# Patient Record
Sex: Female | Born: 1935 | Race: Black or African American | Hispanic: No | Smoking: Former smoker
Health system: Southern US, Community
[De-identification: ages and names within clinical notes are randomized; demographics above are authoritative.]

## PROBLEM LIST (undated history)

## (undated) DIAGNOSIS — I10 Essential (primary) hypertension: Secondary | ICD-10-CM

## (undated) DIAGNOSIS — I82409 Acute embolism and thrombosis of unspecified deep veins of unspecified lower extremity: Secondary | ICD-10-CM

## (undated) DIAGNOSIS — I4891 Unspecified atrial fibrillation: Secondary | ICD-10-CM

## (undated) DIAGNOSIS — I499 Cardiac arrhythmia, unspecified: Secondary | ICD-10-CM

## (undated) DIAGNOSIS — I639 Cerebral infarction, unspecified: Secondary | ICD-10-CM

## (undated) DIAGNOSIS — Z853 Personal history of malignant neoplasm of breast: Secondary | ICD-10-CM

## (undated) HISTORY — DX: Cerebral infarction, unspecified: I63.9

---

## 2014-07-19 ENCOUNTER — Emergency Department (HOSPITAL_BASED_OUTPATIENT_CLINIC_OR_DEPARTMENT_OTHER)
Admission: EM | Admit: 2014-07-19 | Discharge: 2014-07-19 | Disposition: A | Discharge status: Home / Self Care | Payer: Medicare Other | Attending: Emergency Medicine | Admitting: Emergency Medicine

## 2014-07-19 ENCOUNTER — Encounter (HOSPITAL_BASED_OUTPATIENT_CLINIC_OR_DEPARTMENT_OTHER): Payer: Self-pay | Admitting: *Deleted

## 2014-07-19 DIAGNOSIS — E860 Dehydration: Secondary | ICD-10-CM | POA: Insufficient documentation

## 2014-07-19 DIAGNOSIS — Z79899 Other long term (current) drug therapy: Secondary | ICD-10-CM | POA: Diagnosis not present

## 2014-07-19 DIAGNOSIS — Z792 Long term (current) use of antibiotics: Secondary | ICD-10-CM | POA: Diagnosis not present

## 2014-07-19 DIAGNOSIS — Z87891 Personal history of nicotine dependence: Secondary | ICD-10-CM | POA: Diagnosis not present

## 2014-07-19 DIAGNOSIS — N390 Urinary tract infection, site not specified: Secondary | ICD-10-CM | POA: Insufficient documentation

## 2014-07-19 DIAGNOSIS — R42 Dizziness and giddiness: Secondary | ICD-10-CM

## 2014-07-19 DIAGNOSIS — I10 Essential (primary) hypertension: Secondary | ICD-10-CM | POA: Insufficient documentation

## 2014-07-19 DIAGNOSIS — R8271 Bacteriuria: Secondary | ICD-10-CM

## 2014-07-19 HISTORY — DX: Essential (primary) hypertension: I10

## 2014-07-19 LAB — BASIC METABOLIC PANEL
Anion gap: 11 (ref 5–15)
BUN: 16 mg/dL (ref 6–23)
CALCIUM: 12.8 mg/dL — AB (ref 8.4–10.5)
CO2: 26 mEq/L (ref 19–32)
CREATININE: 1 mg/dL (ref 0.50–1.10)
Chloride: 96 mEq/L (ref 96–112)
GFR calc Af Amer: 61 mL/min — ABNORMAL LOW (ref >90)
GFR, EST NON AFRICAN AMERICAN: 53 mL/min — AB (ref >90)
GLUCOSE: 120 mg/dL — AB (ref 70–99)
Potassium: 3.7 mEq/L (ref 3.7–5.3)
Sodium: 133 mEq/L — ABNORMAL LOW (ref 137–147)

## 2014-07-19 LAB — CBC WITH DIFFERENTIAL/PLATELET
BASOS ABS: 0.1 10*3/uL (ref 0.0–0.1)
Basophils Relative: 0 % (ref 0–1)
Eosinophils Absolute: 0 10*3/uL (ref 0.0–0.7)
Eosinophils Relative: 0 % (ref 0–5)
HCT: 36 % (ref 36.0–46.0)
Hemoglobin: 11.9 g/dL — ABNORMAL LOW (ref 12.0–15.0)
LYMPHS PCT: 10 % — AB (ref 12–46)
Lymphs Abs: 1.3 10*3/uL (ref 0.7–4.0)
MCH: 26.6 pg (ref 26.0–34.0)
MCHC: 33.1 g/dL (ref 30.0–36.0)
MCV: 80.5 fL (ref 78.0–100.0)
Monocytes Absolute: 0.7 10*3/uL (ref 0.1–1.0)
Monocytes Relative: 6 % (ref 3–12)
Neutro Abs: 10.3 10*3/uL — ABNORMAL HIGH (ref 1.7–7.7)
Neutrophils Relative %: 84 % — ABNORMAL HIGH (ref 43–77)
Platelets: 172 10*3/uL (ref 150–400)
RBC: 4.47 MIL/uL (ref 3.87–5.11)
RDW: 14 % (ref 11.5–15.5)
WBC: 12.3 10*3/uL — AB (ref 4.0–10.5)

## 2014-07-19 LAB — URINALYSIS, ROUTINE W REFLEX MICROSCOPIC
GLUCOSE, UA: NEGATIVE mg/dL
KETONES UR: 15 mg/dL — AB
Nitrite: POSITIVE — AB
Protein, ur: 100 mg/dL — AB
Specific Gravity, Urine: 1.026 (ref 1.005–1.030)
Urobilinogen, UA: >8 mg/dL — ABNORMAL HIGH (ref 0.0–1.0)
pH: 6 (ref 5.0–8.0)

## 2014-07-19 LAB — URINE MICROSCOPIC-ADD ON

## 2014-07-19 MED ORDER — SODIUM CHLORIDE 0.9 % IV BOLUS (SEPSIS)
1000.0000 mL | Freq: Once | INTRAVENOUS | Status: AC
  Administered 2014-07-19: 1000 mL via INTRAVENOUS

## 2014-07-19 NOTE — Discharge Instructions (Signed)
Asymptomatic Bacteriuria Asymptomatic bacteriuria is the presence of a large number of bacteria in your urine without the usual symptoms of burning or frequent urination. The following conditions increase the risk of asymptomatic bacteriuria:  Diabetes mellitus.  Advanced age.  Pregnancy in the first trimester.  Kidney stones.  Kidney transplants.  Leaky kidney tube valve in young children (reflux). Treatment for this condition is not needed in most people and can lead to other problems such as too much yeast and growth of resistant bacteria. However, some people, such as pregnant women, do need treatment to prevent kidney infection. Asymptomatic bacteriuria in pregnancy is also associated with fetal growth restriction, premature labor, and newborn death. HOME CARE INSTRUCTIONS Monitor your condition for any changes. The following actions may help to relieve any discomfort you are feeling:  Drink enough water and fluids to keep your urine clear or pale yellow. Go to the bathroom more often to keep your bladder empty.  Keep the area around your vagina and rectum clean. Wipe yourself from front to back after urinating. SEEK IMMEDIATE MEDICAL CARE IF:  You develop signs of an infection such as:  Burning with urination.  Frequency of voiding.  Back pain.  Fever.  You have blood in the urine.  You develop a fever. MAKE SURE YOU:  Understand these instructions.  Will watch your condition.  Will get help right away if you are not doing well or get worse. Document Released: 08/07/2005 Document Revised: 12/22/2013 Document Reviewed: 01/27/2013 [facility] Patient Information 2015 [place], [place]. This information is not intended to replace advice given to you by your health care provider. Make sure you discuss any questions you have with your health care provider.    Dizziness Dizziness is a common problem. It is a feeling of unsteadiness or light-headedness. You may feel like  you are about to faint. Dizziness can lead to injury if you stumble or fall. A person of any age group can suffer from dizziness, but dizziness is more common in older adults. CAUSES  Dizziness can be caused by many different things, including:  Middle ear problems.  Standing for too long.  Infections.  An allergic reaction.  Aging.  An emotional response to something, such as the sight of blood.  Side effects of medicines.  Tiredness.  Problems with circulation or blood pressure.  Excessive use of alcohol or medicines, or illegal drug use.  Breathing too fast (hyperventilation).  An irregular heart rhythm (arrhythmia).  A low red blood cell count (anemia).  Pregnancy.  Vomiting, diarrhea, fever, or other illnesses that cause body fluid loss (dehydration).  Diseases or conditions such as Parkinson's disease, high blood pressure (hypertension), diabetes, and thyroid problems.  Exposure to extreme heat. DIAGNOSIS  Your health care provider will ask about your symptoms, perform a physical exam, and perform an electrocardiogram (ECG) to record the electrical activity of your heart. Your health care provider may also perform other heart or blood tests to determine the cause of your dizziness. These may include:  Transthoracic echocardiogram (TTE). During echocardiography, sound waves are used to evaluate how blood flows through your heart.  Transesophageal echocardiogram (TEE).  Cardiac monitoring. This allows your health care provider to monitor your heart rate and rhythm in real time.  Holter monitor. This is a portable device that records your heartbeat and can help diagnose heart arrhythmias. It allows your health care provider to track your heart activity for several days if needed.  Stress tests by exercise or by giving medicine  that makes the heart beat faster. TREATMENT  Treatment of dizziness depends on the cause of your symptoms and can vary greatly. HOME CARE  INSTRUCTIONS   Drink enough fluids to keep your urine clear or pale yellow. This is especially important in very hot weather. In older adults, it is also important in cold weather.  Take your medicine exactly as directed if your dizziness is caused by medicines. When taking blood pressure medicines, it is especially important to get up slowly.  Rise slowly from chairs and steady yourself until you feel okay.  In the morning, first sit up on the side of the bed. When you feel okay, stand slowly while holding onto something until you know your balance is fine.  Move your legs often if you need to stand in one place for a long time. Tighten and relax your muscles in your legs while standing.  Have someone stay with you for 1-2 days if dizziness continues to be a problem. Do this until you feel you are well enough to stay alone. Have the person call your health care provider if he or she notices changes in you that are concerning.  Do not drive or use heavy machinery if you feel dizzy.  Do not drink alcohol. SEEK IMMEDIATE MEDICAL CARE IF:   Your dizziness or light-headedness gets worse.  You feel nauseous or vomit.  You have problems talking, walking, or using your arms, hands, or legs.  You feel weak.  You are not thinking clearly or you have trouble forming sentences. It may take a friend or family member to notice this.  You have chest pain, abdominal pain, shortness of breath, or sweating.  Your vision changes.  You notice any bleeding.  You have side effects from medicine that seems to be getting worse rather than better. MAKE SURE YOU:   Understand these instructions.  Will watch your condition.  Will get help right away if you are not doing well or get worse. Document Released: 01/31/2001 Document Revised: 08/12/2013 Document Reviewed: 02/24/2011 [facility] Patient Information 2015 [place], [place]. This information is not intended to replace advice given to you by your  health care provider. Make sure you discuss any questions you have with your health care provider.    Dehydration, Adult Dehydration is when you lose more fluids from the body than you take in. Vital organs like the kidneys, brain, and heart cannot function without a proper amount of fluids and salt. Any loss of fluids from the body can cause dehydration.  CAUSES   Vomiting.  Diarrhea.  Excessive sweating.  Excessive urine output.  Fever. SYMPTOMS  Mild dehydration  Thirst.  Dry lips.  Slightly dry mouth. Moderate dehydration  Very dry mouth.  Sunken eyes.  Skin does not bounce back quickly when lightly pinched and released.  Dark urine and decreased urine production.  Decreased tear production.  Headache. Severe dehydration  Very dry mouth.  Extreme thirst.  Rapid, weak pulse (more than 100 beats per minute at rest).  Cold hands and feet.  Not able to sweat in spite of heat and temperature.  Rapid breathing.  Blue lips.  Confusion and lethargy.  Difficulty being awakened.  Minimal urine production.  No tears. DIAGNOSIS  Your caregiver will diagnose dehydration based on your symptoms and your exam. Blood and urine tests will help confirm the diagnosis. The diagnostic evaluation should also identify the cause of dehydration. TREATMENT  Treatment of mild or moderate dehydration can often be done at  home by increasing the amount of fluids that you drink. It is best to drink small amounts of fluid more often. Drinking too much at one time can make vomiting worse. Refer to the home care instructions below. Severe dehydration needs to be treated at the hospital where you will probably be given intravenous (IV) fluids that contain water and electrolytes. HOME CARE INSTRUCTIONS   Ask your caregiver about specific rehydration instructions.  Drink enough fluids to keep your urine clear or pale yellow.  Drink small amounts frequently if you have nausea and  vomiting.  Eat as you normally do.  Avoid:  Foods or drinks high in sugar.  Carbonated drinks.  Juice.  Extremely hot or cold fluids.  Drinks with caffeine.  Fatty, greasy foods.  Alcohol.  Tobacco.  Overeating.  Gelatin desserts.  Wash your hands well to avoid spreading bacteria and viruses.  Only take over-the-counter or prescription medicines for pain, discomfort, or fever as directed by your caregiver.  Ask your caregiver if you should continue all prescribed and over-the-counter medicines.  Keep all follow-up appointments with your caregiver. SEEK MEDICAL CARE IF:  You have abdominal pain and it increases or stays in one area (localizes).  You have a rash, stiff neck, or severe headache.  You are irritable, sleepy, or difficult to awaken.  You are weak, dizzy, or extremely thirsty. SEEK IMMEDIATE MEDICAL CARE IF:   You are unable to keep fluids down or you get worse despite treatment.  You have frequent episodes of vomiting or diarrhea.  You have blood or green matter (bile) in your vomit.  You have blood in your stool or your stool looks black and tarry.  You have not urinated in 6 to 8 hours, or you have only urinated a small amount of very dark urine.  You have a fever.  You faint. MAKE SURE YOU:   Understand these instructions.  Will watch your condition.  Will get help right away if you are not doing well or get worse. Document Released: 08/07/2005 Document Revised: 10/30/2011 Document Reviewed: 03/27/2011 [facility] Patient Information 2015 [place], [place]. This information is not intended to replace advice given to you by your health care provider. Make sure you discuss any questions you have with your health care provider.

## 2014-07-19 NOTE — ED Notes (Signed)
Patient states that she has felt dizzy with N/V/D  on and off since Wednesday. States that she mostly just drinks ginger ale and water, but not eating much. Was taking an abx for an infection, last dose was taken on Wednesday. States that she didn't finish the course.

## 2014-07-19 NOTE — ED Provider Notes (Signed)
CSN: [id number]     Arrival date & time 07/19/14  0704 History   First MD Initiated Contact with Patient 07/19/14 [phone]     Chief Complaint  Patient presents with  . Dizziness     (Consider location/radiation/quality/duration/timing/severity/associated sxs/prior Treatment) HPI  80 year old female presents with lightheadedness with standing over the past 4-5 days. It feels like she's going to pass out, no spinning sensation. She was on Ceftin for a cellulitis of her left breast but stopped it 5 days ago because she started developing vomiting and diarrhea. She is continued to have vomiting once or twice a day as well as many loose stools per day. There is no mucus or blood in her stools. No abdominal pain. No headache, chest pain, or shortness of breath. She has not noticed any fevers. The patient states that while she is laying or sitting she has no symptoms but when she stands up she feels lightheaded that improves with sitting back down. Denies any weakness or numbness. Has not felt off balance and has not fallen. She feels like her breast infection has resolved.  Past Medical History  Diagnosis Date  . Hypertension    History reviewed. No pertinent past surgical history. No family history on file. History  Substance Use Topics  . Smoking status: Former Research scientist (life sciences)  . Smokeless tobacco: Not on file  . Alcohol Use: No   OB History    No data available     Review of Systems  Constitutional: Negative for fever.  Respiratory: Negative for shortness of breath.   Cardiovascular: Negative for chest pain.  Gastrointestinal: Positive for vomiting and diarrhea. Negative for abdominal pain and blood in stool.  Genitourinary: Positive for frequency (for quite some time). Negative for dysuria.  Musculoskeletal: Negative for back pain.  Neurological: Positive for light-headedness. Negative for weakness and numbness.  All other systems reviewed and are negative.     Allergies  Review of  patient's allergies indicates no known allergies.  Home Medications   Prior to Admission medications   Medication Sig Start Date End Date Taking? Authorizing Provider  amLODipine-valsartan (EXFORGE) 5-160 MG per tablet Take 1 tablet by mouth daily.   Yes Historical Provider, MD  cefUROXime (CEFTIN) 250 MG tablet Take 250 mg by mouth 2 (two) times daily with a meal.   Yes Historical Provider, MD  LORazepam (ATIVAN) 1 MG tablet Take 1 mg by mouth every 8 (eight) hours.   Yes Historical Provider, MD   BP 133/59 mmHg  Pulse 102  Temp(Src) 99.3 F (37.4 C) (Oral)  Resp 18  Ht 5\' 6"  (1.676 m)  Wt 140 lb (63.504 kg)  BMI 22.61 kg/m2  SpO2 96% Physical Exam  Constitutional: She is oriented to person, place, and time. She appears well-developed and well-nourished. No distress.  HENT:  Head: Normocephalic and atraumatic.  Right Ear: External ear normal.  Left Ear: External ear normal.  Nose: Nose normal.  Eyes: EOM are normal. Pupils are equal, round, and reactive to light. Right eye exhibits no discharge. Left eye exhibits no discharge.  Cardiovascular: Normal rate, regular rhythm and normal heart sounds.   No murmur heard. Pulmonary/Chest: Effort normal and breath sounds normal.  Abdominal: Soft. She exhibits no distension. There is no tenderness.  Neurological: She is alert and oriented to person, place, and time.  CN 2-12 grossly intact. 5/5 strength in all 4 extremities. Grossly normal sensation. Normal finger to nose. Normal gait.  Skin: Skin is warm and dry. She is not  diaphoretic.  Nursing note and vitals reviewed.   ED Course  Procedures (including critical care time) Labs Review Labs Reviewed  BASIC METABOLIC PANEL - Abnormal; Notable for the following:    Sodium 133 (*)    Glucose, Bld 120 (*)    Calcium 12.8 (*)    GFR calc non Af Amer 53 (*)    GFR calc Af Amer 61 (*)    All other components within normal limits  URINALYSIS, ROUTINE W REFLEX MICROSCOPIC - Abnormal;  Notable for the following:    Color, Urine ORANGE (*)    APPearance CLOUDY (*)    Hgb urine dipstick TRACE (*)    Bilirubin Urine MODERATE (*)    Ketones, ur 15 (*)    Protein, ur 100 (*)    Urobilinogen, UA >8.0 (*)    Nitrite POSITIVE (*)    Leukocytes, UA MODERATE (*)    All other components within normal limits  CBC WITH DIFFERENTIAL - Abnormal; Notable for the following:    WBC 12.3 (*)    Hemoglobin 11.9 (*)    Neutrophils Relative % 84 (*)    Neutro Abs 10.3 (*)    Lymphocytes Relative 10 (*)    All other components within normal limits  URINE MICROSCOPIC-ADD ON - Abnormal; Notable for the following:    Squamous Epithelial / LPF MANY (*)    Bacteria, UA MANY (*)    Casts HYALINE CASTS (*)    All other components within normal limits  URINE CULTURE    Imaging Review No results found.   EKG Interpretation   Date/Time:  Sunday July 19 2014 07:44:28 EST Ventricular Rate:  90 PR Interval:  154 QRS Duration: 76 QT Interval:  314 QTC Calculation: 384 R Axis:   -34 Text Interpretation:  Normal sinus rhythm Left axis deviation T wave  abnormality, consider lateral ischemia Abnormal ECG No old tracing to  compare Confirmed by [place] ([id number]) on 07/19/2014 7:51:30 AM      MDM   Final diagnoses:  Lightheadedness  Dehydration  Asymptomatic bacteriuria    Patient's lightheadedness appears to be from a dehydration given her vomiting and orthostatic symptoms. Patient's lab work is unremarkable, no evidence of renal failure. She feels better with IV fluids and oral fluids. She does have asymptomatic bacteriuria. Given the she's not symptomatic, will send for culture and hold off on antibiotics as I believe that the antibiotic induced diarrhea is what is causing most of her symptoms. Her symptoms seemed to resolve on the ED. There is no sign that this is a vertigo. Patient is not having any chest pain or anginal symptoms to be concerned with ACS. Discussed  strict return precautions, recommended increased fluid intake, and will recommend close PCP follow-up.    Ephraim Hamburger, MD 07/19/14 [phone]

## 2014-07-21 LAB — URINE CULTURE: Colony Count: 15000

## 2017-05-08 DIAGNOSIS — I4821 Permanent atrial fibrillation: Secondary | ICD-10-CM | POA: Diagnosis present

## 2017-05-08 DIAGNOSIS — I10 Essential (primary) hypertension: Secondary | ICD-10-CM | POA: Diagnosis present

## 2017-05-18 ENCOUNTER — Emergency Department (HOSPITAL_COMMUNITY): Payer: Medicare HMO

## 2017-05-18 ENCOUNTER — Emergency Department (HOSPITAL_COMMUNITY): Payer: Medicare HMO | Admitting: Anesthesiology

## 2017-05-18 ENCOUNTER — Encounter (HOSPITAL_COMMUNITY): Payer: Self-pay | Admitting: Emergency Medicine

## 2017-05-18 ENCOUNTER — Inpatient Hospital Stay (HOSPITAL_COMMUNITY): Payer: Medicare HMO

## 2017-05-18 ENCOUNTER — Encounter (HOSPITAL_COMMUNITY)
Admission: EM | Disposition: A | Discharge status: Home / Self Care | Payer: Self-pay | Source: Home / Self Care | Attending: Neurology

## 2017-05-18 ENCOUNTER — Inpatient Hospital Stay (HOSPITAL_COMMUNITY)
Admission: EM | Admit: 2017-05-18 | Discharge: 2017-05-21 | DRG: 024 | Disposition: A | Discharge status: Home / Self Care | Payer: Medicare HMO | Attending: Neurology | Admitting: Neurology

## 2017-05-18 DIAGNOSIS — D352 Benign neoplasm of pituitary gland: Secondary | ICD-10-CM | POA: Diagnosis present

## 2017-05-18 DIAGNOSIS — R414 Neurologic neglect syndrome: Secondary | ICD-10-CM | POA: Diagnosis present

## 2017-05-18 DIAGNOSIS — Z853 Personal history of malignant neoplasm of breast: Secondary | ICD-10-CM | POA: Diagnosis not present

## 2017-05-18 DIAGNOSIS — I10 Essential (primary) hypertension: Secondary | ICD-10-CM | POA: Diagnosis present

## 2017-05-18 DIAGNOSIS — Z91013 Allergy to seafood: Secondary | ICD-10-CM

## 2017-05-18 DIAGNOSIS — Z978 Presence of other specified devices: Secondary | ICD-10-CM

## 2017-05-18 DIAGNOSIS — I6310 Cerebral infarction due to embolism of unspecified precerebral artery: Secondary | ICD-10-CM | POA: Diagnosis not present

## 2017-05-18 DIAGNOSIS — Z7901 Long term (current) use of anticoagulants: Secondary | ICD-10-CM

## 2017-05-18 DIAGNOSIS — I63412 Cerebral infarction due to embolism of left middle cerebral artery: Principal | ICD-10-CM

## 2017-05-18 DIAGNOSIS — E785 Hyperlipidemia, unspecified: Secondary | ICD-10-CM | POA: Diagnosis present

## 2017-05-18 DIAGNOSIS — Z91011 Allergy to milk products: Secondary | ICD-10-CM

## 2017-05-18 DIAGNOSIS — Z90710 Acquired absence of both cervix and uterus: Secondary | ICD-10-CM | POA: Diagnosis not present

## 2017-05-18 DIAGNOSIS — Z86718 Personal history of other venous thrombosis and embolism: Secondary | ICD-10-CM | POA: Diagnosis not present

## 2017-05-18 DIAGNOSIS — I480 Paroxysmal atrial fibrillation: Secondary | ICD-10-CM | POA: Diagnosis present

## 2017-05-18 DIAGNOSIS — I272 Pulmonary hypertension, unspecified: Secondary | ICD-10-CM | POA: Diagnosis present

## 2017-05-18 DIAGNOSIS — I63449 Cerebral infarction due to embolism of unspecified cerebellar artery: Secondary | ICD-10-CM | POA: Diagnosis not present

## 2017-05-18 DIAGNOSIS — R4701 Aphasia: Secondary | ICD-10-CM | POA: Diagnosis present

## 2017-05-18 DIAGNOSIS — I639 Cerebral infarction, unspecified: Secondary | ICD-10-CM

## 2017-05-18 DIAGNOSIS — Z87891 Personal history of nicotine dependence: Secondary | ICD-10-CM

## 2017-05-18 DIAGNOSIS — E876 Hypokalemia: Secondary | ICD-10-CM | POA: Diagnosis not present

## 2017-05-18 DIAGNOSIS — I4821 Permanent atrial fibrillation: Secondary | ICD-10-CM | POA: Diagnosis present

## 2017-05-18 DIAGNOSIS — I6340 Cerebral infarction due to embolism of unspecified cerebral artery: Secondary | ICD-10-CM | POA: Diagnosis present

## 2017-05-18 DIAGNOSIS — F419 Anxiety disorder, unspecified: Secondary | ICD-10-CM | POA: Diagnosis present

## 2017-05-18 DIAGNOSIS — Z881 Allergy status to other antibiotic agents status: Secondary | ICD-10-CM

## 2017-05-18 DIAGNOSIS — I739 Peripheral vascular disease, unspecified: Secondary | ICD-10-CM | POA: Diagnosis present

## 2017-05-18 DIAGNOSIS — D649 Anemia, unspecified: Secondary | ICD-10-CM | POA: Diagnosis present

## 2017-05-18 DIAGNOSIS — H4010X0 Unspecified open-angle glaucoma, stage unspecified: Secondary | ICD-10-CM

## 2017-05-18 DIAGNOSIS — I482 Chronic atrial fibrillation: Secondary | ICD-10-CM | POA: Diagnosis not present

## 2017-05-18 DIAGNOSIS — Z8249 Family history of ischemic heart disease and other diseases of the circulatory system: Secondary | ICD-10-CM

## 2017-05-18 DIAGNOSIS — G8191 Hemiplegia, unspecified affecting right dominant side: Secondary | ICD-10-CM | POA: Diagnosis present

## 2017-05-18 DIAGNOSIS — R29717 NIHSS score 17: Secondary | ICD-10-CM | POA: Diagnosis present

## 2017-05-18 DIAGNOSIS — I5030 Unspecified diastolic (congestive) heart failure: Secondary | ICD-10-CM | POA: Diagnosis not present

## 2017-05-18 DIAGNOSIS — Z79899 Other long term (current) drug therapy: Secondary | ICD-10-CM

## 2017-05-18 DIAGNOSIS — Z91018 Allergy to other foods: Secondary | ICD-10-CM

## 2017-05-18 HISTORY — PX: RADIOLOGY WITH ANESTHESIA: SHX6223

## 2017-05-18 HISTORY — DX: Acute embolism and thrombosis of unspecified deep veins of unspecified lower extremity: I82.409

## 2017-05-18 HISTORY — PX: IR ANGIO INTRA EXTRACRAN SEL COM CAROTID INNOMINATE UNI R MOD SED: IMG5359

## 2017-05-18 HISTORY — PX: IR ANGIO VERTEBRAL SEL SUBCLAVIAN INNOMINATE UNI R MOD SED: IMG5365

## 2017-05-18 HISTORY — DX: Unspecified atrial fibrillation: I48.91

## 2017-05-18 HISTORY — DX: Personal history of malignant neoplasm of breast: Z85.3

## 2017-05-18 HISTORY — PX: IR PERCUTANEOUS ART THROMBECTOMY/INFUSION INTRACRANIAL INC DIAG ANGIO: IMG6087

## 2017-05-18 LAB — I-STAT CHEM 8, ED
BUN: 12 mg/dL (ref 6–20)
CALCIUM ION: 0.97 mmol/L — AB (ref 1.15–1.40)
CHLORIDE: 105 mmol/L (ref 101–111)
Creatinine, Ser: 1 mg/dL (ref 0.44–1.00)
GLUCOSE: 94 mg/dL (ref 65–99)
HCT: 38 % (ref 36.0–46.0)
Hemoglobin: 12.9 g/dL (ref 12.0–15.0)
Potassium: 2.8 mmol/L — ABNORMAL LOW (ref 3.5–5.1)
SODIUM: 143 mmol/L (ref 135–145)
TCO2: 25 mmol/L (ref 22–32)

## 2017-05-18 LAB — DIFFERENTIAL
BASOS ABS: 0 10*3/uL (ref 0.0–0.1)
BASOS PCT: 0 %
EOS ABS: 0.2 10*3/uL (ref 0.0–0.7)
Eosinophils Relative: 2 %
Lymphocytes Relative: 36 %
Lymphs Abs: 3.4 10*3/uL (ref 0.7–4.0)
MONOS PCT: 10 %
Monocytes Absolute: 0.9 10*3/uL (ref 0.1–1.0)
Neutro Abs: 4.9 10*3/uL (ref 1.7–7.7)
Neutrophils Relative %: 52 %

## 2017-05-18 LAB — CBC
HEMATOCRIT: 34.1 % — AB (ref 36.0–46.0)
Hemoglobin: 10.8 g/dL — ABNORMAL LOW (ref 12.0–15.0)
MCH: 24.1 pg — ABNORMAL LOW (ref 26.0–34.0)
MCHC: 31.7 g/dL (ref 30.0–36.0)
MCV: 75.9 fL — ABNORMAL LOW (ref 78.0–100.0)
PLATELETS: 218 10*3/uL (ref 150–400)
RBC: 4.49 MIL/uL (ref 3.87–5.11)
RDW: 18.3 % — AB (ref 11.5–15.5)
WBC: 9.4 10*3/uL (ref 4.0–10.5)

## 2017-05-18 LAB — COMPREHENSIVE METABOLIC PANEL
ALT: 24 U/L (ref 14–54)
AST: 29 U/L (ref 15–41)
Albumin: 3.9 g/dL (ref 3.5–5.0)
Alkaline Phosphatase: 77 U/L (ref 38–126)
Anion gap: 11 (ref 5–15)
BUN: 12 mg/dL (ref 6–20)
CHLORIDE: 107 mmol/L (ref 101–111)
CO2: 21 mmol/L — AB (ref 22–32)
Calcium: 8.5 mg/dL — ABNORMAL LOW (ref 8.9–10.3)
Creatinine, Ser: 1.03 mg/dL — ABNORMAL HIGH (ref 0.44–1.00)
GFR, EST AFRICAN AMERICAN: 58 mL/min — AB (ref >60)
GFR, EST NON AFRICAN AMERICAN: 50 mL/min — AB (ref >60)
Glucose, Bld: 88 mg/dL (ref 65–99)
POTASSIUM: 2.9 mmol/L — AB (ref 3.5–5.1)
SODIUM: 139 mmol/L (ref 135–145)
Total Bilirubin: 1.1 mg/dL (ref 0.3–1.2)
Total Protein: 7.6 g/dL (ref 6.5–8.1)

## 2017-05-18 LAB — ETHANOL

## 2017-05-18 LAB — RAPID URINE DRUG SCREEN, HOSP PERFORMED
AMPHETAMINES: NOT DETECTED
Barbiturates: NOT DETECTED
Benzodiazepines: POSITIVE — AB
COCAINE: NOT DETECTED
OPIATES: NOT DETECTED
TETRAHYDROCANNABINOL: NOT DETECTED

## 2017-05-18 LAB — URINALYSIS, ROUTINE W REFLEX MICROSCOPIC
BACTERIA UA: NONE SEEN
Bilirubin Urine: NEGATIVE
Glucose, UA: NEGATIVE mg/dL
Hgb urine dipstick: NEGATIVE
Ketones, ur: NEGATIVE mg/dL
Leukocytes, UA: NEGATIVE
Nitrite: NEGATIVE
Protein, ur: 100 mg/dL — AB
SPECIFIC GRAVITY, URINE: 1.035 — AB (ref 1.005–1.030)
pH: 6 (ref 5.0–8.0)

## 2017-05-18 LAB — I-STAT TROPONIN, ED: Troponin i, poc: 0 ng/mL (ref 0.00–0.08)

## 2017-05-18 LAB — GLUCOSE, CAPILLARY: GLUCOSE-CAPILLARY: 77 mg/dL (ref 65–99)

## 2017-05-18 LAB — PROTIME-INR
INR: 1.36
PROTHROMBIN TIME: 16.6 s — AB (ref 11.4–15.2)

## 2017-05-18 LAB — APTT: APTT: 29 s (ref 24–36)

## 2017-05-18 SURGERY — RADIOLOGY WITH ANESTHESIA
Anesthesia: General

## 2017-05-18 MED ORDER — PROPOFOL 500 MG/50ML IV EMUL
INTRAVENOUS | Status: DC | PRN
  Administered 2017-05-18: 50 ug/kg/min via INTRAVENOUS

## 2017-05-18 MED ORDER — ONDANSETRON HCL 4 MG/2ML IJ SOLN
INTRAMUSCULAR | Status: DC | PRN
  Administered 2017-05-18: 4 mg via INTRAVENOUS

## 2017-05-18 MED ORDER — IOPAMIDOL (ISOVUE-370) INJECTION 76%
INTRAVENOUS | Status: AC
Start: 2017-05-18 — End: 2017-05-19
  Filled 2017-05-18: qty 50

## 2017-05-18 MED ORDER — SENNOSIDES-DOCUSATE SODIUM 8.6-50 MG PO TABS: 1.0000 | ORAL_TABLET | Freq: Every evening | ORAL | Status: DC | PRN

## 2017-05-18 MED ORDER — CEFAZOLIN SODIUM-DEXTROSE 2-3 GM-% IV SOLR
INTRAVENOUS | Status: DC | PRN
  Administered 2017-05-18: 2 g via INTRAVENOUS

## 2017-05-18 MED ORDER — ACETAMINOPHEN 160 MG/5ML PO SOLN: 650.0000 mg | ORAL | Status: DC | PRN

## 2017-05-18 MED ORDER — ROCURONIUM BROMIDE 100 MG/10ML IV SOLN
INTRAVENOUS | Status: DC | PRN
Start: 2017-05-18 — End: 2017-05-18
  Administered 2017-05-18: 50 mg via INTRAVENOUS

## 2017-05-18 MED ORDER — ORAL CARE MOUTH RINSE
15.0000 mL | OROMUCOSAL | Status: DC
  Administered 2017-05-18 – 2017-05-19 (×7): 15 mL via OROMUCOSAL

## 2017-05-18 MED ORDER — PROPOFOL 1000 MG/100ML IV EMUL
INTRAVENOUS | Status: AC
  Administered 2017-05-18: 60 ug/kg/min via INTRAVENOUS
  Filled 2017-05-18: qty 100

## 2017-05-18 MED ORDER — SODIUM CHLORIDE 0.9 % IV SOLN
INTRAVENOUS | Status: DC
  Administered 2017-05-20: 1000 mL via INTRAVENOUS

## 2017-05-18 MED ORDER — PROPOFOL 1000 MG/100ML IV EMUL
5.0000 ug/kg/min | INTRAVENOUS | Status: DC
  Administered 2017-05-19: 55 ug/kg/min via INTRAVENOUS
  Filled 2017-05-18 (×2): qty 100

## 2017-05-18 MED ORDER — ASPIRIN EC 325 MG PO TBEC
325.0000 mg | DELAYED_RELEASE_TABLET | Freq: Every day | ORAL | Status: DC
  Administered 2017-05-20: 325 mg via ORAL
  Filled 2017-05-18: qty 1

## 2017-05-18 MED ORDER — SUCCINYLCHOLINE CHLORIDE 20 MG/ML IJ SOLN
INTRAMUSCULAR | Status: DC | PRN
  Administered 2017-05-18: 100 mg via INTRAVENOUS

## 2017-05-18 MED ORDER — PANTOPRAZOLE SODIUM 40 MG PO PACK
40.0000 mg | PACK | Freq: Every day | ORAL | Status: DC
Start: 2017-05-19 — End: 2017-05-19

## 2017-05-18 MED ORDER — PROPOFOL 10 MG/ML IV BOLUS
INTRAVENOUS | Status: DC | PRN
  Administered 2017-05-18: 100 mg via INTRAVENOUS

## 2017-05-18 MED ORDER — EPTIFIBATIDE 20 MG/10ML IV SOLN
INTRAVENOUS | Status: AC
  Filled 2017-05-18: qty 10

## 2017-05-18 MED ORDER — FENTANYL CITRATE (PF) 100 MCG/2ML IJ SOLN
INTRAMUSCULAR | Status: DC | PRN
  Administered 2017-05-18: 75 ug via INTRAVENOUS
  Administered 2017-05-18 (×3): 50 ug via INTRAVENOUS
  Administered 2017-05-18: 25 ug via INTRAVENOUS

## 2017-05-18 MED ORDER — STROKE: EARLY STAGES OF RECOVERY BOOK
Freq: Once | Status: AC
  Administered 2017-05-21: 07:00:00
  Filled 2017-05-18 (×2): qty 1

## 2017-05-18 MED ORDER — ACETAMINOPHEN 650 MG RE SUPP: 650.0000 mg | RECTAL | Status: DC | PRN

## 2017-05-18 MED ORDER — ONDANSETRON HCL 4 MG/2ML IJ SOLN: 4.0000 mg | Freq: Four times a day (QID) | INTRAMUSCULAR | Status: DC | PRN

## 2017-05-18 MED ORDER — ASPIRIN 300 MG RE SUPP
300.0000 mg | Freq: Every day | RECTAL | Status: DC
  Administered 2017-05-19: 300 mg via RECTAL
  Filled 2017-05-18: qty 1

## 2017-05-18 MED ORDER — NITROGLYCERIN 1 MG/10 ML FOR IR/CATH LAB
INTRA_ARTERIAL | Status: AC
  Filled 2017-05-18: qty 10

## 2017-05-18 MED ORDER — LIDOCAINE HCL (CARDIAC) 20 MG/ML IV SOLN
INTRAVENOUS | Status: DC | PRN
  Administered 2017-05-18: 80 mg via INTRAVENOUS

## 2017-05-18 MED ORDER — LIDOCAINE HCL 1 % IJ SOLN
INTRAMUSCULAR | Status: AC
  Filled 2017-05-18: qty 20

## 2017-05-18 MED ORDER — ENOXAPARIN SODIUM 40 MG/0.4ML ~~LOC~~ SOLN
40.0000 mg | SUBCUTANEOUS | Status: DC
  Administered 2017-05-19 – 2017-05-20 (×2): 40 mg via SUBCUTANEOUS
  Filled 2017-05-18 (×2): qty 0.4

## 2017-05-18 MED ORDER — FENTANYL CITRATE (PF) 100 MCG/2ML IJ SOLN
INTRAMUSCULAR | Status: AC
  Filled 2017-05-18: qty 4

## 2017-05-18 MED ORDER — IOPAMIDOL (ISOVUE-300) INJECTION 61%
INTRAVENOUS | Status: AC
  Administered 2017-05-18: 70 mL
  Filled 2017-05-18: qty 300

## 2017-05-18 MED ORDER — IOPAMIDOL (ISOVUE-370) INJECTION 76%
INTRAVENOUS | Status: AC
  Filled 2017-05-18: qty 50

## 2017-05-18 MED ORDER — SUGAMMADEX SODIUM 200 MG/2ML IV SOLN
INTRAVENOUS | Status: DC | PRN
  Administered 2017-05-18: 200 mg via INTRAVENOUS

## 2017-05-18 MED ORDER — PHENYLEPHRINE HCL 10 MG/ML IJ SOLN
INTRAVENOUS | Status: DC | PRN
  Administered 2017-05-18: 10 ug/min via INTRAVENOUS

## 2017-05-18 MED ORDER — CEFAZOLIN SODIUM-DEXTROSE 2-4 GM/100ML-% IV SOLN
INTRAVENOUS | Status: AC
  Filled 2017-05-18: qty 100

## 2017-05-18 MED ORDER — CHLORHEXIDINE GLUCONATE 0.12% ORAL RINSE (MEDLINE KIT)
15.0000 mL | Freq: Two times a day (BID) | OROMUCOSAL | Status: DC
  Administered 2017-05-18 – 2017-05-19 (×2): 15 mL via OROMUCOSAL

## 2017-05-18 MED ORDER — NICARDIPINE HCL IN NACL 20-0.86 MG/200ML-% IV SOLN
0.0000 mg/h | INTRAVENOUS | Status: DC
  Administered 2017-05-19: 2.5 mg/h via INTRAVENOUS
  Filled 2017-05-18: qty 200

## 2017-05-18 MED ORDER — ACETAMINOPHEN 325 MG PO TABS: 650.0000 mg | ORAL_TABLET | ORAL | Status: DC | PRN

## 2017-05-18 MED ORDER — ACETAMINOPHEN 325 MG PO TABS
650.0000 mg | ORAL_TABLET | ORAL | Status: DC | PRN
  Administered 2017-05-21 (×2): 650 mg via ORAL
  Filled 2017-05-18 (×2): qty 2

## 2017-05-18 MED ORDER — PHENYLEPHRINE HCL 10 MG/ML IJ SOLN
INTRAMUSCULAR | Status: DC | PRN
  Administered 2017-05-18 (×3): 80 ug via INTRAVENOUS

## 2017-05-18 MED ORDER — SODIUM CHLORIDE 0.9 % IV SOLN
INTRAVENOUS | Status: DC | PRN
  Administered 2017-05-18 (×2): via INTRAVENOUS

## 2017-05-18 MED ORDER — PROPOFOL 1000 MG/100ML IV EMUL
5.0000 ug/kg/min | INTRAVENOUS | Status: DC
  Administered 2017-05-18: 60 ug/kg/min via INTRAVENOUS

## 2017-05-18 NOTE — H&P (Signed)
Referring Physician: Dr. Tyrone Nine    Chief Complaint: aphasia, right neglect  HPI: Amber Barnett is an 83 y.o. female with PMH of Afib and DVT on eliquis, HTN, left breast cancer s/p surgery presented as code stroke for acute onset stroke symptoms as above.  Pt was shopping with family at 3:50pm when she develop acute onset speech difficulty and right weakness. EMS called. On arrival, pt had left gaze, right hemiparesis, aphasia. Glucose 93 and BP 158/68. EKG did not show afib rhythm. Code stroke called. She was sent to CT at [facility]. No bleeding and ASPECT 10. CTA head and neck showed left M1 cut off and CTP large penumbra and almost no infarct core.   On exam, she still has global aphasia, but right arm and leg moving much improved, but still has drift. Still has right neglect, left gaze preference but eye able to cross midline. Tele showed afib. NIHSS = 17. IR notified and pt took to IR suite.   As per family, pt is compliant with medication although they did not check her pill box everyday. At baseline, she is active and talking and walking.   LSN: 3:50pm tPA Given: No: taking eliquis   Past Medical History:  Diagnosis Date  . Hypertension     History reviewed. No pertinent surgical history.  No family history on file. Social History:  reports that she has quit smoking. Her smoking use included Cigarettes. She has never used smokeless tobacco. She reports that she does not drink alcohol. Her drug history is not on file.  Allergies: No Known Allergies  ROS: Review of Systems: ROS was attempted today and was able to be performed.  Systems assessed include - Constitutional, Eyes, HENT, Respiratory, Cardiovascular, Gastrointestinal, Genitourinary, Integument/breast, Hematologic/lymphatic, Musculoskeletal, Neurological, Behavioral/Psych, Endocrine,  Allergic/Immunologic - the patient complains of only the following symptoms, and all other reviewed systems are negative.   Physical  Examination:  SpO2:  [95 %] 95 % (09/28 1620)  General - Well nourished, well developed, in no apparent distress.  Ophthalmologic - Fundi not visualized due to noncooperation.  Cardiovascular - irregularly irregular heart rate and rhythm.  Neuro - awake alert, global aphasia, not following commands, no speech outpt. PERRL, left gaze preference but able to cross midline, right neglect. Not blinking to visual threat on the right. Mild right nasolabial fold flattening. LUE and LLE 5/5 and RUE 4/5 and RLE 4+/5. DTR 1+ and no babinski. Sensation, coordination and gait not tested.  NIH Stroke Scale  Level Of Consciousness 0=Alert; keenly responsive 1=Not alert, but arousable by minor stimulation 2=Not alert, requires repeated stimulation 3=Responds only with reflex movements 0  LOC Questions to Month and Age 55=Answers both questions correctly 1=Answers one question correctly 2=Answers neither question correctly 2  LOC Commands      -Open/Close eyes     -Open/close grip 0=Performs both tasks correctly 1=Performs one task correctly 2=Performs neighter task correctly 2  Best Gaze 0=Normal 1=Partial gaze palsy 2=Forced deviation, or total gaze paresis 1  Visual 0=No visual loss 1=Partial hemianopia 2=Complete hemianopia 3=Bilateral hemianopia (blind including cortical blindness) 2  Facial Palsy 0=Normal symmetrical movement 1=Minor paralysis (asymmetry) 2=Partial paralysis (lower face) 3=Complete paralysis (upper and lower face) 1  Motor  0=No drift, limb holds posture for full 10 seconds 1=Drift, limb holds posture, no drift to bed 2=Some antigravity effort, cannot maintain posture, drifts to bed 3=No effort against gravity, limb falls 4=No movement Right Arm 1     Leg 1  Left Arm 0     Leg 0  Limb Ataxia 0=Absent 1=Present in one limb 2=Present in two limbs 0  Sensory 0=Normal 1=Mild to moderate sensory loss 2=Severe to total sensory loss 0  Best Language 0=No aphasia,  normal 1=Mild to moderate aphasia 2=Mute, global aphasia 3=Mute, global aphasia 3  Dysarthria 0=Normal 1=Mild to moderate 2=Severe, unintelligible or mute/anarthric 2  Extinction/Neglect 0=No abnormality 1=Extinction to bilateral simultaneous stimulation 2=Profound neglect 2  Total   17     Results for orders placed or performed during the hospital encounter of 05/18/17 (from the past 48 hour(s))  Protime-INR     Status: Abnormal   Collection Time: 05/18/17  4:15 PM  Result Value Ref Range   Prothrombin Time 16.6 (H) 11.4 - 15.2 seconds   INR 1.36   APTT     Status: None   Collection Time: 05/18/17  4:15 PM  Result Value Ref Range   aPTT 29 24 - 36 seconds  CBC     Status: Abnormal   Collection Time: 05/18/17  4:15 PM  Result Value Ref Range   WBC 9.4 4.0 - 10.5 K/uL   RBC 4.49 3.87 - 5.11 MIL/uL   Hemoglobin 10.8 (L) 12.0 - 15.0 g/dL   HCT 34.1 (L) 36.0 - 46.0 %   MCV 75.9 (L) 78.0 - 100.0 fL   MCH 24.1 (L) 26.0 - 34.0 pg   MCHC 31.7 30.0 - 36.0 g/dL   RDW 18.3 (H) 11.5 - 15.5 %   Platelets 218 150 - 400 K/uL  Differential     Status: None   Collection Time: 05/18/17  4:15 PM  Result Value Ref Range   Neutrophils Relative % 52 %   Neutro Abs 4.9 1.7 - 7.7 K/uL   Lymphocytes Relative 36 %   Lymphs Abs 3.4 0.7 - 4.0 K/uL   Monocytes Relative 10 %   Monocytes Absolute 0.9 0.1 - 1.0 K/uL   Eosinophils Relative 2 %   Eosinophils Absolute 0.2 0.0 - 0.7 K/uL   Basophils Relative 0 %   Basophils Absolute 0.0 0.0 - 0.1 K/uL  I-stat troponin, ED     Status: None   Collection Time: 05/18/17  4:18 PM  Result Value Ref Range   Troponin i, poc 0.00 0.00 - 0.08 ng/mL   Comment 3            Comment: Due to the release kinetics of cTnI, a negative result within the first hours of the onset of symptoms does not rule out myocardial infarction with certainty. If myocardial infarction is still suspected, repeat the test at appropriate intervals.   I-stat chem 8, ed      Status: Abnormal   Collection Time: 05/18/17  4:20 PM  Result Value Ref Range   Sodium 143 135 - 145 mmol/L   Potassium 2.8 (L) 3.5 - 5.1 mmol/L   Chloride 105 101 - 111 mmol/L   BUN 12 6 - 20 mg/dL   Creatinine, Ser 1.00 0.44 - 1.00 mg/dL   Glucose, Bld 94 65 - 99 mg/dL   Calcium, Ion 0.97 (L) 1.15 - 1.40 mmol/L   TCO2 25 22 - 32 mmol/L   Hemoglobin 12.9 12.0 - 15.0 g/dL   HCT 38.0 36.0 - 46.0 %   Ct Angio Head W Or Wo Contrast  Result Date: 05/18/2017 CLINICAL DATA:  83 year old female with right facial droop and loss of speech, symptom onset about 1 hour ago. Noncontrast head CT suspicious for low  left MCA large vessel occlusion EXAM: CT ANGIOGRAPHY HEAD AND NECK CT PERFUSION BRAIN TECHNIQUE: Multidetector CT imaging of the head and neck was performed using the standard protocol during bolus administration of intravenous contrast. Multiplanar CT image reconstructions and MIPs were obtained to evaluate the vascular anatomy. Carotid stenosis measurements (when applicable) are obtained utilizing NASCET criteria, using the distal internal carotid diameter as the denominator. Multiphase CT imaging of the brain was performed following IV bolus contrast injection. Subsequent parametric perfusion maps were calculated using RAPID software. CONTRAST:  90 mL Isovue 370 COMPARISON:  Noncontrast head CT 1629 hours today. FINDINGS: CT Brain Perfusion Findings: CBF (<30%) Volume: 0 mL in the left hemisphere; erroneous appearing 7 mL core infarct detected in the right hemisphere near the vertex. Perfusion (Tmax>6.0s) volume: 139 mL, the majority of which is in the left hemisphere Mismatch Volume:  Significant, at least 100 mL visually. Infarction Location:No core infarct detected in the left hemisphere. CTA NECK Skeleton: Advanced degenerative changes in the cervical spine. No acute osseous abnormality identified. Upper chest: Negative lung apices. No superior mediastinal lymphadenopathy. Other neck: Negative  aside from a partially retropharyngeal course of both carotid arteries. No cervical lymphadenopathy. Aortic arch: 3 vessel arch configuration. Moderate soft and calcified arch atherosclerosis. Right carotid system: Negative to the bifurcation. Mild for age bifurcation soft and calcified plaque. Tortuous cervical right ICA with a partially retropharyngeal course, but no atherosclerotic stenosis. Left carotid system: Negative left CCA origin. Minimal plaque at the left carotid bifurcation. Partially retropharyngeal course and tortuosity of the left ICA without stenosis. Vertebral arteries: No proximal right subclavian artery stenosis. Normal right vertebral artery origin. Tortuous but otherwise negative right vertebral artery to the skullbase. No proximal subclavian artery stenosis despite soft plaque. Normal left vertebral artery origin. Tortuous but otherwise negative left vertebral artery to the skullbase. CTA HEAD Posterior circulation: Mild distal vertebral artery irregularity. No distal vertebral stenosis. Patent PICA origins and vertebrobasilar junction. Patent basilar artery without stenosis. Patent SCA origins. Normal left PCA origin. Fetal type right PCA origin. The left Posterior communicating artery is diminutive or absent. Bilateral PCA branches are within normal limits. Anterior circulation: Both ICA siphons are patent. Tortuosity and mild to moderate calcified plaque, greater on the left. Perhaps moderate but no high-grade left siphon stenosis. Normal right posterior communicating artery origin. Patent carotid termini. Normal MCA and ACA origins. Anterior communicating artery and bilateral ACA branches are within normal limits. Right MCA M1 segment, bifurcation, and right MCA branches are within normal limits. Left MCA M1 segment is occluded 12 mm beyond its origin. The left MCA bifurcation is occluded but there is left M2 and M3 collateral flow (series 11, image 19). Venous sinuses: Patent. Anatomic  variants: Fetal right PCA origin. Review of the MIP images confirms the above findings IMPRESSION: 1. Positive for emergent large vessel occlusion at the left MCA M1 segment. Favorable perfusion findings for Interventional revascularization: left hemisphere penumbra but no core infarct detected by CT perfusion. This was discussed by telephone with Dr. Erlinda Hong On 05/18/2017 at 1643 hours. 2. Bilateral ICA siphon calcified plaque is greater on the left but without high-grade stenosis. Tortuous cervical carotid arteries but mild for age carotid bifurcation atherosclerosis without stenosis. 3. Negative posterior circulation. Electronically Signed   By: Genevie Ann M.D.   On: 05/18/2017 16:59   Ct Angio Neck W Or Wo Contrast  Result Date: 05/18/2017 CLINICAL DATA:  83 year old female with right facial droop and loss of speech, symptom onset about 1  hour ago. Noncontrast head CT suspicious for low left MCA large vessel occlusion EXAM: CT ANGIOGRAPHY HEAD AND NECK CT PERFUSION BRAIN TECHNIQUE: Multidetector CT imaging of the head and neck was performed using the standard protocol during bolus administration of intravenous contrast. Multiplanar CT image reconstructions and MIPs were obtained to evaluate the vascular anatomy. Carotid stenosis measurements (when applicable) are obtained utilizing NASCET criteria, using the distal internal carotid diameter as the denominator. Multiphase CT imaging of the brain was performed following IV bolus contrast injection. Subsequent parametric perfusion maps were calculated using RAPID software. CONTRAST:  90 mL Isovue 370 COMPARISON:  Noncontrast head CT 1629 hours today. FINDINGS: CT Brain Perfusion Findings: CBF (<30%) Volume: 0 mL in the left hemisphere; erroneous appearing 7 mL core infarct detected in the right hemisphere near the vertex. Perfusion (Tmax>6.0s) volume: 139 mL, the majority of which is in the left hemisphere Mismatch Volume:  Significant, at least 100 mL visually.  Infarction Location:No core infarct detected in the left hemisphere. CTA NECK Skeleton: Advanced degenerative changes in the cervical spine. No acute osseous abnormality identified. Upper chest: Negative lung apices. No superior mediastinal lymphadenopathy. Other neck: Negative aside from a partially retropharyngeal course of both carotid arteries. No cervical lymphadenopathy. Aortic arch: 3 vessel arch configuration. Moderate soft and calcified arch atherosclerosis. Right carotid system: Negative to the bifurcation. Mild for age bifurcation soft and calcified plaque. Tortuous cervical right ICA with a partially retropharyngeal course, but no atherosclerotic stenosis. Left carotid system: Negative left CCA origin. Minimal plaque at the left carotid bifurcation. Partially retropharyngeal course and tortuosity of the left ICA without stenosis. Vertebral arteries: No proximal right subclavian artery stenosis. Normal right vertebral artery origin. Tortuous but otherwise negative right vertebral artery to the skullbase. No proximal subclavian artery stenosis despite soft plaque. Normal left vertebral artery origin. Tortuous but otherwise negative left vertebral artery to the skullbase. CTA HEAD Posterior circulation: Mild distal vertebral artery irregularity. No distal vertebral stenosis. Patent PICA origins and vertebrobasilar junction. Patent basilar artery without stenosis. Patent SCA origins. Normal left PCA origin. Fetal type right PCA origin. The left Posterior communicating artery is diminutive or absent. Bilateral PCA branches are within normal limits. Anterior circulation: Both ICA siphons are patent. Tortuosity and mild to moderate calcified plaque, greater on the left. Perhaps moderate but no high-grade left siphon stenosis. Normal right posterior communicating artery origin. Patent carotid termini. Normal MCA and ACA origins. Anterior communicating artery and bilateral ACA branches are within normal limits.  Right MCA M1 segment, bifurcation, and right MCA branches are within normal limits. Left MCA M1 segment is occluded 12 mm beyond its origin. The left MCA bifurcation is occluded but there is left M2 and M3 collateral flow (series 11, image 19). Venous sinuses: Patent. Anatomic variants: Fetal right PCA origin. Review of the MIP images confirms the above findings IMPRESSION: 1. Positive for emergent large vessel occlusion at the left MCA M1 segment. Favorable perfusion findings for Interventional revascularization: left hemisphere penumbra but no core infarct detected by CT perfusion. This was discussed by telephone with Dr. Erlinda Hong On 05/18/2017 at 1643 hours. 2. Bilateral ICA siphon calcified plaque is greater on the left but without high-grade stenosis. Tortuous cervical carotid arteries but mild for age carotid bifurcation atherosclerosis without stenosis. 3. Negative posterior circulation. Electronically Signed   By: Genevie Ann M.D.   On: 05/18/2017 16:59   Ct Cerebral Perfusion W Contrast  Result Date: 05/18/2017 CLINICAL DATA:  83 year old female with right facial droop and loss  of speech, symptom onset about 1 hour ago. Noncontrast head CT suspicious for low left MCA large vessel occlusion EXAM: CT ANGIOGRAPHY HEAD AND NECK CT PERFUSION BRAIN TECHNIQUE: Multidetector CT imaging of the head and neck was performed using the standard protocol during bolus administration of intravenous contrast. Multiplanar CT image reconstructions and MIPs were obtained to evaluate the vascular anatomy. Carotid stenosis measurements (when applicable) are obtained utilizing NASCET criteria, using the distal internal carotid diameter as the denominator. Multiphase CT imaging of the brain was performed following IV bolus contrast injection. Subsequent parametric perfusion maps were calculated using RAPID software. CONTRAST:  90 mL Isovue 370 COMPARISON:  Noncontrast head CT 1629 hours today. FINDINGS: CT Brain Perfusion Findings: CBF  (<30%) Volume: 0 mL in the left hemisphere; erroneous appearing 7 mL core infarct detected in the right hemisphere near the vertex. Perfusion (Tmax>6.0s) volume: 139 mL, the majority of which is in the left hemisphere Mismatch Volume:  Significant, at least 100 mL visually. Infarction Location:No core infarct detected in the left hemisphere. CTA NECK Skeleton: Advanced degenerative changes in the cervical spine. No acute osseous abnormality identified. Upper chest: Negative lung apices. No superior mediastinal lymphadenopathy. Other neck: Negative aside from a partially retropharyngeal course of both carotid arteries. No cervical lymphadenopathy. Aortic arch: 3 vessel arch configuration. Moderate soft and calcified arch atherosclerosis. Right carotid system: Negative to the bifurcation. Mild for age bifurcation soft and calcified plaque. Tortuous cervical right ICA with a partially retropharyngeal course, but no atherosclerotic stenosis. Left carotid system: Negative left CCA origin. Minimal plaque at the left carotid bifurcation. Partially retropharyngeal course and tortuosity of the left ICA without stenosis. Vertebral arteries: No proximal right subclavian artery stenosis. Normal right vertebral artery origin. Tortuous but otherwise negative right vertebral artery to the skullbase. No proximal subclavian artery stenosis despite soft plaque. Normal left vertebral artery origin. Tortuous but otherwise negative left vertebral artery to the skullbase. CTA HEAD Posterior circulation: Mild distal vertebral artery irregularity. No distal vertebral stenosis. Patent PICA origins and vertebrobasilar junction. Patent basilar artery without stenosis. Patent SCA origins. Normal left PCA origin. Fetal type right PCA origin. The left Posterior communicating artery is diminutive or absent. Bilateral PCA branches are within normal limits. Anterior circulation: Both ICA siphons are patent. Tortuosity and mild to moderate calcified  plaque, greater on the left. Perhaps moderate but no high-grade left siphon stenosis. Normal right posterior communicating artery origin. Patent carotid termini. Normal MCA and ACA origins. Anterior communicating artery and bilateral ACA branches are within normal limits. Right MCA M1 segment, bifurcation, and right MCA branches are within normal limits. Left MCA M1 segment is occluded 12 mm beyond its origin. The left MCA bifurcation is occluded but there is left M2 and M3 collateral flow (series 11, image 19). Venous sinuses: Patent. Anatomic variants: Fetal right PCA origin. Review of the MIP images confirms the above findings IMPRESSION: 1. Positive for emergent large vessel occlusion at the left MCA M1 segment. Favorable perfusion findings for Interventional revascularization: left hemisphere penumbra but no core infarct detected by CT perfusion. This was discussed by telephone with Dr. Erlinda Hong On 05/18/2017 at 1643 hours. 2. Bilateral ICA siphon calcified plaque is greater on the left but without high-grade stenosis. Tortuous cervical carotid arteries but mild for age carotid bifurcation atherosclerosis without stenosis. 3. Negative posterior circulation. Electronically Signed   By: Genevie Ann M.D.   On: 05/18/2017 16:59   Ct Head Code Stroke Wo Contrast`  Result Date: 05/18/2017 CLINICAL DATA:  Code  stroke.  Right facial weakness.  Absent speech. EXAM: CT HEAD WITHOUT CONTRAST TECHNIQUE: Contiguous axial images were obtained from the base of the skull through the vertex without intravenous contrast. COMPARISON:  None. FINDINGS: Brain: No evidence of acute infarction, hemorrhage, hydrocephalus, extra-axial collection or mass lesion/mass effect. Vascular: Dense left distal M1 segment, especially on reformats. Skull: No acute or aggressive finding. Bulky right cervical facet arthropathy at C2-3 Sinuses/Orbits: No acute finding Other: Page to Dr Erlinda Hong placed 05/18/2017 at 4:30 pm. CT perfusion in progress. ASPECTS  [facility] Stroke Program Early CT Score) - Ganglionic level infarction (caudate, lentiform nuclei, internal capsule, insula, M1-M3 cortex): 7 - Supraganglionic infarction (M4-M6 cortex): 3 Total score (0-10 with 10 being normal): 10 IMPRESSION: Evidence of distal left M1 thrombus. No hemorrhage or visible infarct. Aspects is 10. Electronically Signed   By: Monte Fantasia M.D.   On: 05/18/2017 16:33    Assessment: 83 y.o. female PMH of Afib and DVT on eliquis, HTN, left breast cancer s/p surgery presented as code stroke for acute onset left gaze, right hemiparesis, aphasia. Glucose 93 and BP 158/68. NIHSS = 17. She was sent to CT at [facility]. No bleeding and ASPECT 10. CTA head and neck showed left M1 cut off and CTP large penumbra and almost no infarct core. Good candidate for IR. Will take to IR suite. No tPA due to on eliquis at home.  Stroke Risk Factors - atrial fibrillation  Plan: - IR for mechanical thrombectomy - admit to ICU after procedure - BP goal per IR - MRI brain and MRA head post procedure - 2D echo - antithrombotic therapy will depends on MRI and procedure result - statin once po access - consult CCM for vent management - speech/PT/OT  Rosalin Hawking, MD PhD Stroke Neurology 05/18/2017 5:33 PM  This patient is critically ill due to code stroke, left MCA infarct, afib on AC and at significant risk of neurological worsening, death form recurrent stroke, hemorrhagic conversion, seizure, heart failure. This patient's care requires constant monitoring of vital signs, hemodynamics, respiratory and cardiac monitoring, review of multiple databases, neurological assessment, discussion with family, other specialists and medical decision making of high complexity. I spent 50 minutes of neurocritical care time in the care of this patient.

## 2017-05-18 NOTE — Progress Notes (Signed)
Patient ID: Amber Barnett, female   DOB: 1933-09-27, 83 y.o.   MRN: [id number] yr old RT H female  MRSscore 0,  LSW 3 50 pm Acute onset of aphasia and rt sided hemiparesis  pm. and  Lt gaze deviation NIH 16,= CT  Head No ICH . ASPECTS 10 CTA LT MCA M1 occlusion  CTP CBF< 30% vol 81ml. Tmax >  6.0s vol 134ml  Mismatch 132 ml Mismatch ratio 19.9  Findings discussed with neurologist and family. Option of endovascular revascularization to prevent further ischemic injury discussed in detail.Risks of ICH 10 to 15 % ,worsening neuro deficit,vent dependency ,death and inability to revascularize all reviewed in detail. Questions answered to their satisfaction. Informed witnessed consent obtained. S.Kaynan Klonowski MD

## 2017-05-18 NOTE — Progress Notes (Signed)
Dr Aroor made aware of pt increase restlessness when stimulated. The decision was made to delay MRI at this time.

## 2017-05-18 NOTE — Anesthesia Preprocedure Evaluation (Signed)
Anesthesia Evaluation  Patient identified by MRN, date of birth, ID band Patient unresponsive    Reviewed: Allergy & Precautions, NPO status , Patient's Chart, lab work & pertinent test results, Unable to perform ROS - Chart review onlyPreop documentation limited or incomplete due to emergent nature of procedure.  Airway Mallampati: II  TM Distance: >3 FB Neck ROM: Full    Dental  (+) Teeth Intact, Dental Advisory Given   Pulmonary former smoker,    Pulmonary exam normal breath sounds clear to auscultation       Cardiovascular hypertension, Pt. on medications + Peripheral Vascular Disease  + dysrhythmias Atrial Fibrillation  Rhythm:Irregular Rate:Normal     Neuro/Psych aphasia, right neglect CVA, Residual Symptoms negative psych ROS   GI/Hepatic negative GI ROS, Neg liver ROS,   Endo/Other  negative endocrine ROS  Renal/GU negative Renal ROS     Musculoskeletal negative musculoskeletal ROS (+)   Abdominal   Peds  Hematology  (+) Blood dyscrasia, anemia ,   Anesthesia Other Findings Day of surgery medications reviewed with the patient.  Reproductive/Obstetrics                             Anesthesia Physical Anesthesia Plan  ASA: IV and emergent  Anesthesia Plan: General   Post-op Pain Management:    Induction: Intravenous and Rapid sequence  PONV Risk Score and Plan: 3 and Ondansetron, Dexamethasone and Treatment may vary due to age or medical condition  Airway Management Planned: Oral ETT  Additional Equipment: Arterial line  Intra-op Plan:   Post-operative Plan: Post-operative intubation/ventilation  Informed Consent: I have reviewed the patients History and Physical, chart, labs and discussed the procedure including the risks, benefits and alternatives for the proposed anesthesia with the patient or authorized representative who has indicated his/her understanding and  acceptance.   Dental advisory given  Plan Discussed with: CRNA  Anesthesia Plan Comments: (Emergent case.  Patient met in IR suite, aphasic.  Emergent chart review prior to induction.)        Anesthesia Quick Evaluation

## 2017-05-18 NOTE — Anesthesia Procedure Notes (Signed)
Procedure Name: Intubation Date/Time: 05/18/2017 5:21 PM Performed by: Manus Gunning, Habeeb Puertas J Pre-anesthesia Checklist: Patient identified, Emergency Drugs available, Suction available and Patient being monitored Patient Re-evaluated:Patient Re-evaluated prior to induction Oxygen Delivery Method: Circle system utilized Preoxygenation: Pre-oxygenation with 100% oxygen Induction Type: IV induction Ventilation: Mask ventilation without difficulty Laryngoscope Size: Mac and 4 Grade View: Grade I Tube type: Subglottic suction tube Tube size: 7.5 mm Number of attempts: 1 Airway Equipment and Method: Stylet Placement Confirmation: ETT inserted through vocal cords under direct vision,  positive ETCO2 and breath sounds checked- equal and bilateral Secured at: 22 cm Tube secured with: Tape Dental Injury: Teeth and Oropharynx as per pre-operative assessment

## 2017-05-18 NOTE — Progress Notes (Signed)
Patient transported to PACU from CT 3 on ventilator with no complications.

## 2017-05-18 NOTE — Consult Note (Signed)
PULMONARY CRITICAL CARE CONSULT   Name: Amber Barnett MRN: [id number] DOB: 1934/02/20    ADMISSION DATE:  05/18/2017 CONSULTATION DATE: 05/18/17  REFERRING MD : Erlinda Hong MD CHIEF COMPLAINT:  Aphasia , right neglect  BRIEF PATIENT DESCRIPTION: 83 yr old female with PMHx Left Breast CA s/p surgery, HTN, Afib, DVT on anticoagulation with eliquis presents after acute onset of aphasia and right sided weakness. CTH showed left M1 CVA. Now intubated s/p IR mechanical thrombectomy for revascularization.   SIGNIFICANT EVENTS : Left M1 CVA. S/p revascularization. 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 Pt is currently s/p IR revascularization intubated on mechanical ventilation. CCM was consulted for ventilator mgmt  PAST MEDICAL HISTORY :  As specified above including the following Surgical History  S/p Appendectomy S/p partial Hysterectomy S/p excision of left breast mass for biopsy  Prior to Admission medications   Medication Sig Start Date End Date Taking? Authorizing Provider  amLODipine-valsartan (EXFORGE) 5-160 MG per tablet Take 1 tablet by mouth daily.    [provider]  cefUROXime (CEFTIN) 250 MG tablet Take 250 mg by mouth 2 (two) times daily with a meal.    [provider]  LORazepam (ATIVAN) 1 MG tablet Take 1 mg by mouth every 8 (eight) hours.    [provider]  ALLERGIES: Amoxicillin, fish containing  products, milk, nuts  IMMUNIZATIONS: Pneumococcal PCV13- 2002 Influenza -04/21/17  FAMILY HISTORY:  Cousin, maternal Aunt, Mother -> positive for Breast CA Sister and Father with CAD  SOCIAL HISTORY:  reports that she has quit smoking. Her smoking use included Cigarettes. She has never used smokeless tobacco. She reports that she does not drink alcohol.  REVIEW OF SYSTEMS:  Unable to acquire this information patient is intubated and on sedation Constitutional: Negative for fever, chills, weight loss, malaise/fatigue and diaphoresis.  HENT: Negative for hearing loss, ear pain, nosebleeds, congestion, sore throat, neck pain, tinnitus and ear discharge.   Eyes: Negative for blurred vision, double vision, photophobia, pain, discharge and redness.  Respiratory: Negative for cough, hemoptysis, sputum production, shortness of breath, wheezing and stridor.   Cardiovascular: Negative for chest pain, palpitations, orthopnea, claudication, leg swelling and PND.  Gastrointestinal: Negative for heartburn, nausea, vomiting, abdominal pain, diarrhea, constipation, blood in stool and melena.  Genitourinary: Negative for dysuria, urgency, frequency, hematuria and flank pain.  Musculoskeletal: Negative for myalgias, back pain, joint pain and falls.  Skin: Negative for itching and rash.  Neurological: Negative for dizziness, tingling, tremors, sensory change, speech change, focal weakness, seizures, loss of consciousness, weakness and headaches.  Endo/Heme/Allergies: Negative for environmental allergies and polydipsia. Does not bruise/bleed easily.  SUBJECTIVE:   VITAL SIGNS: Temp:  [97.2 F (36.2 C)] 97.2 F (36.2 C) (09/28 2000) Pulse Rate:  [26-48] 38 (09/28 2045) Resp:  [16-21] 16 (09/28 2045) BP: (83-153)/(56-89) 83/59 (09/28 2045) SpO2:  [95 %-100 %] 100 % (09/28 2045) Arterial Line BP: (98-122)/(49-62) 119/55 (09/28 2045) FiO2 (%):  [100 %] 100 % (09/28 1921) Weight:  [68.4 kg (150 lb  12.7  oz)] 68.4 kg (150 lb 12.7 oz) (09/28 2037)  PHYSICAL EXAMINATION: General:  In no acute distress, remains sedated Neuro: sedated with propofol 30ggt HEENT:  Normocephalic atraumatic endotracheal tube in place Cardiovascular: HR in the 60s. Irregularly irregular, S1 and S2 appreciated no rub no gallop Lungs:  Coarse breath sounds assoc with mechanical ventilation Abdomen: soft abdomen, scaphoid, + BS in all 4 quadrants Musculoskeletal: no evidence of deformity Skin:  Intact and dry no rashes appreciated   Recent Labs Lab 05/18/17 1615 05/18/17 1620  NA 139 143  K 2.9* 2.8*  CL 107 105  CO2 21*  --   BUN 12 12  CREATININE 1.03* 1.00  GLUCOSE 88 94    Recent Labs Lab 05/18/17 1615 05/18/17 1620  HGB 10.8* 12.9  HCT 34.1* 38.0  WBC 9.4  --   PLT 218  --    Ct Angio Head W Or Wo Contrast  Result Date: 05/18/2017 CLINICAL DATA:  83 year old female with right facial droop and loss of speech, symptom onset about 1 hour ago. Noncontrast head CT suspicious for low left MCA large vessel occlusion EXAM: CT ANGIOGRAPHY HEAD AND NECK CT PERFUSION BRAIN TECHNIQUE: Multidetector CT imaging of the head and neck was performed using the standard protocol during bolus administration of intravenous contrast. Multiplanar CT image reconstructions and MIPs were obtained to evaluate the vascular anatomy. Carotid stenosis measurements (when applicable) are obtained utilizing NASCET criteria, using the distal internal carotid diameter as the denominator. Multiphase CT imaging of the brain was performed following IV bolus contrast injection. Subsequent parametric perfusion maps were calculated using RAPID software. CONTRAST:  90 mL Isovue 370 COMPARISON:  Noncontrast head CT 1629 hours today. FINDINGS: CT Brain Perfusion Findings: CBF (<30%) Volume: 0 mL in the left hemisphere; erroneous appearing 7 mL core infarct detected in the right hemisphere near the vertex. Perfusion (Tmax>6.0s) volume: 139 mL, the  majority of which is in the left hemisphere Mismatch Volume:  Significant, at least 100 mL visually. Infarction Location:No core infarct detected in the left hemisphere. CTA NECK Skeleton: Advanced degenerative changes in the cervical spine. No acute osseous abnormality identified. Upper chest: Negative lung apices. No superior mediastinal lymphadenopathy. Other neck: Negative aside from a partially retropharyngeal course of both carotid arteries. No cervical lymphadenopathy. Aortic arch: 3 vessel arch configuration. Moderate soft and calcified arch atherosclerosis. Right carotid system: Negative to the bifurcation. Mild for age bifurcation soft and calcified plaque. Tortuous cervical right ICA with a partially retropharyngeal course, but no atherosclerotic stenosis. Left carotid system: Negative left CCA origin. Minimal plaque at the left carotid bifurcation. Partially retropharyngeal course and tortuosity of the left ICA without stenosis. Vertebral arteries: No proximal right subclavian artery stenosis. Normal right vertebral artery origin. Tortuous but otherwise negative right vertebral artery to the skullbase. No proximal subclavian artery stenosis despite soft plaque. Normal left vertebral artery origin. Tortuous but otherwise negative left vertebral artery to the skullbase. CTA HEAD Posterior circulation: Mild distal vertebral artery irregularity. No distal vertebral stenosis. Patent PICA origins and vertebrobasilar junction. Patent basilar artery without stenosis. Patent SCA origins. Normal left PCA origin. Fetal type right PCA origin. The left Posterior communicating artery is diminutive or absent. Bilateral PCA branches are within normal limits. Anterior circulation: Both ICA siphons are patent. Tortuosity and mild to moderate calcified plaque, greater on the left. Perhaps moderate but no high-grade left siphon stenosis. Normal right posterior communicating artery origin. Patent carotid termini. Normal MCA  and ACA origins. Anterior communicating artery  and bilateral ACA branches are within normal limits. Right MCA M1 segment, bifurcation, and right MCA branches are within normal limits. Left MCA M1 segment is occluded 12 mm beyond its origin. The left MCA bifurcation is occluded but there is left M2 and M3 collateral flow (series 11, image 19). Venous sinuses: Patent. Anatomic variants: Fetal right PCA origin. Review of the MIP images confirms the above findings IMPRESSION: 1. Positive for emergent large vessel occlusion at the left MCA M1 segment. Favorable perfusion findings for Interventional revascularization: left hemisphere penumbra but no core infarct detected by CT perfusion. This was discussed by telephone with Dr. Erlinda Hong On 05/18/2017 at 1643 hours. 2. Bilateral ICA siphon calcified plaque is greater on the left but without high-grade stenosis. Tortuous cervical carotid arteries but mild for age carotid bifurcation atherosclerosis without stenosis. 3. Negative posterior circulation. Electronically Signed   By: Genevie Ann M.D.   On: 05/18/2017 16:59   Ct Head Wo Contrast  Result Date: 05/18/2017 CLINICAL DATA:  Status post complete revascularization of occluded LEFT middle cerebral artery. EXAM: CT HEAD WITHOUT CONTRAST TECHNIQUE: Contiguous axial images were obtained from the base of the skull through the vertex without intravenous contrast. COMPARISON:  CT HEAD September 28th 2018 at 1629 hours FINDINGS: BRAIN: No intraparenchymal hemorrhage, mass effect nor midline shift. The ventricles and sulci are normal for age. Patchy supratentorial white matter hypodensities within normal range for patient's age, though non-specific are most compatible with chronic small vessel ischemic disease. No acute large vascular territory infarcts. No contrast staining or abnormal intracranial enhancement. No abnormal extra-axial fluid collections. Basal cisterns are patent. VASCULAR: Moderate calcific atherosclerosis of the  carotid siphons. Dural venous sinuses are patent. SKULL: No skull fracture. No significant scalp soft tissue swelling. SINUSES/ORBITS: Small LEFT sphenoid sinus air-fluid level. Mild paranasal sinus mucosal thickening. Small bilateral mastoid effusions. The included ocular globes and orbital contents are non-suspicious. Status post bilateral ocular lens implants with sub palpebral gas. OTHER: None. IMPRESSION: 1. Postprocedural intravascular contrast without acute intracranial process ; otherwise negative CT HEAD for age. Electronically Signed   By: Elon Alas M.D.   On: 05/18/2017 19:25   Ct Angio Neck W Or Wo Contrast  Result Date: 05/18/2017 CLINICAL DATA:  83 year old female with right facial droop and loss of speech, symptom onset about 1 hour ago. Noncontrast head CT suspicious for low left MCA large vessel occlusion EXAM: CT ANGIOGRAPHY HEAD AND NECK CT PERFUSION BRAIN TECHNIQUE: Multidetector CT imaging of the head and neck was performed using the standard protocol during bolus administration of intravenous contrast. Multiplanar CT image reconstructions and MIPs were obtained to evaluate the vascular anatomy. Carotid stenosis measurements (when applicable) are obtained utilizing NASCET criteria, using the distal internal carotid diameter as the denominator. Multiphase CT imaging of the brain was performed following IV bolus contrast injection. Subsequent parametric perfusion maps were calculated using RAPID software. CONTRAST:  90 mL Isovue 370 COMPARISON:  Noncontrast head CT 1629 hours today. FINDINGS: CT Brain Perfusion Findings: CBF (<30%) Volume: 0 mL in the left hemisphere; erroneous appearing 7 mL core infarct detected in the right hemisphere near the vertex. Perfusion (Tmax>6.0s) volume: 139 mL, the majority of which is in the left hemisphere Mismatch Volume:  Significant, at least 100 mL visually. Infarction Location:No core infarct detected in the left hemisphere. CTA NECK Skeleton:  Advanced degenerative changes in the cervical spine. No acute osseous abnormality identified. Upper chest: Negative lung apices. No superior mediastinal lymphadenopathy. Other neck: Negative aside from a  partially retropharyngeal course of both carotid arteries. No cervical lymphadenopathy. Aortic arch: 3 vessel arch configuration. Moderate soft and calcified arch atherosclerosis. Right carotid system: Negative to the bifurcation. Mild for age bifurcation soft and calcified plaque. Tortuous cervical right ICA with a partially retropharyngeal course, but no atherosclerotic stenosis. Left carotid system: Negative left CCA origin. Minimal plaque at the left carotid bifurcation. Partially retropharyngeal course and tortuosity of the left ICA without stenosis. Vertebral arteries: No proximal right subclavian artery stenosis. Normal right vertebral artery origin. Tortuous but otherwise negative right vertebral artery to the skullbase. No proximal subclavian artery stenosis despite soft plaque. Normal left vertebral artery origin. Tortuous but otherwise negative left vertebral artery to the skullbase. CTA HEAD Posterior circulation: Mild distal vertebral artery irregularity. No distal vertebral stenosis. Patent PICA origins and vertebrobasilar junction. Patent basilar artery without stenosis. Patent SCA origins. Normal left PCA origin. Fetal type right PCA origin. The left Posterior communicating artery is diminutive or absent. Bilateral PCA branches are within normal limits. Anterior circulation: Both ICA siphons are patent. Tortuosity and mild to moderate calcified plaque, greater on the left. Perhaps moderate but no high-grade left siphon stenosis. Normal right posterior communicating artery origin. Patent carotid termini. Normal MCA and ACA origins. Anterior communicating artery and bilateral ACA branches are within normal limits. Right MCA M1 segment, bifurcation, and right MCA branches are within normal limits. Left  MCA M1 segment is occluded 12 mm beyond its origin. The left MCA bifurcation is occluded but there is left M2 and M3 collateral flow (series 11, image 19). Venous sinuses: Patent. Anatomic variants: Fetal right PCA origin. Review of the MIP images confirms the above findings IMPRESSION: 1. Positive for emergent large vessel occlusion at the left MCA M1 segment. Favorable perfusion findings for Interventional revascularization: left hemisphere penumbra but no core infarct detected by CT perfusion. This was discussed by telephone with Dr. Erlinda Hong On 05/18/2017 at 1643 hours. 2. Bilateral ICA siphon calcified plaque is greater on the left but without high-grade stenosis. Tortuous cervical carotid arteries but mild for age carotid bifurcation atherosclerosis without stenosis. 3. Negative posterior circulation. Electronically Signed   By: Genevie Ann M.D.   On: 05/18/2017 16:59   Ct Cerebral Perfusion W Contrast  Result Date: 05/18/2017 CLINICAL DATA:  83 year old female with right facial droop and loss of speech, symptom onset about 1 hour ago. Noncontrast head CT suspicious for low left MCA large vessel occlusion EXAM: CT ANGIOGRAPHY HEAD AND NECK CT PERFUSION BRAIN TECHNIQUE: Multidetector CT imaging of the head and neck was performed using the standard protocol during bolus administration of intravenous contrast. Multiplanar CT image reconstructions and MIPs were obtained to evaluate the vascular anatomy. Carotid stenosis measurements (when applicable) are obtained utilizing NASCET criteria, using the distal internal carotid diameter as the denominator. Multiphase CT imaging of the brain was performed following IV bolus contrast injection. Subsequent parametric perfusion maps were calculated using RAPID software. CONTRAST:  90 mL Isovue 370 COMPARISON:  Noncontrast head CT 1629 hours today. FINDINGS: CT Brain Perfusion Findings: CBF (<30%) Volume: 0 mL in the left hemisphere; erroneous appearing 7 mL core infarct detected  in the right hemisphere near the vertex. Perfusion (Tmax>6.0s) volume: 139 mL, the majority of which is in the left hemisphere Mismatch Volume:  Significant, at least 100 mL visually. Infarction Location:No core infarct detected in the left hemisphere. CTA NECK Skeleton: Advanced degenerative changes in the cervical spine. No acute osseous abnormality identified. Upper chest: Negative lung apices. No superior mediastinal lymphadenopathy.  Other neck: Negative aside from a partially retropharyngeal course of both carotid arteries. No cervical lymphadenopathy. Aortic arch: 3 vessel arch configuration. Moderate soft and calcified arch atherosclerosis. Right carotid system: Negative to the bifurcation. Mild for age bifurcation soft and calcified plaque. Tortuous cervical right ICA with a partially retropharyngeal course, but no atherosclerotic stenosis. Left carotid system: Negative left CCA origin. Minimal plaque at the left carotid bifurcation. Partially retropharyngeal course and tortuosity of the left ICA without stenosis. Vertebral arteries: No proximal right subclavian artery stenosis. Normal right vertebral artery origin. Tortuous but otherwise negative right vertebral artery to the skullbase. No proximal subclavian artery stenosis despite soft plaque. Normal left vertebral artery origin. Tortuous but otherwise negative left vertebral artery to the skullbase. CTA HEAD Posterior circulation: Mild distal vertebral artery irregularity. No distal vertebral stenosis. Patent PICA origins and vertebrobasilar junction. Patent basilar artery without stenosis. Patent SCA origins. Normal left PCA origin. Fetal type right PCA origin. The left Posterior communicating artery is diminutive or absent. Bilateral PCA branches are within normal limits. Anterior circulation: Both ICA siphons are patent. Tortuosity and mild to moderate calcified plaque, greater on the left. Perhaps moderate but no high-grade left siphon stenosis.  Normal right posterior communicating artery origin. Patent carotid termini. Normal MCA and ACA origins. Anterior communicating artery and bilateral ACA branches are within normal limits. Right MCA M1 segment, bifurcation, and right MCA branches are within normal limits. Left MCA M1 segment is occluded 12 mm beyond its origin. The left MCA bifurcation is occluded but there is left M2 and M3 collateral flow (series 11, image 19). Venous sinuses: Patent. Anatomic variants: Fetal right PCA origin. Review of the MIP images confirms the above findings IMPRESSION: 1. Positive for emergent large vessel occlusion at the left MCA M1 segment. Favorable perfusion findings for Interventional revascularization: left hemisphere penumbra but no core infarct detected by CT perfusion. This was discussed by telephone with Dr. Erlinda Hong On 05/18/2017 at 1643 hours. 2. Bilateral ICA siphon calcified plaque is greater on the left but without high-grade stenosis. Tortuous cervical carotid arteries but mild for age carotid bifurcation atherosclerosis without stenosis. 3. Negative posterior circulation. Electronically Signed   By: Genevie Ann M.D.   On: 05/18/2017 16:59   Ct Head Code Stroke Wo Contrast`  Result Date: 05/18/2017 CLINICAL DATA:  Code stroke.  Right facial weakness.  Absent speech. EXAM: CT HEAD WITHOUT CONTRAST TECHNIQUE: Contiguous axial images were obtained from the base of the skull through the vertex without intravenous contrast. COMPARISON:  None. FINDINGS: Brain: No evidence of acute infarction, hemorrhage, hydrocephalus, extra-axial collection or mass lesion/mass effect. Vascular: Dense left distal M1 segment, especially on reformats. Skull: No acute or aggressive finding. Bulky right cervical facet arthropathy at C2-3 Sinuses/Orbits: No acute finding Other: Page to Dr Erlinda Hong placed 05/18/2017 at 4:30 pm. CT perfusion in progress. ASPECTS [facility] Stroke Program Early CT Score) - Ganglionic level infarction (caudate, lentiform  nuclei, internal capsule, insula, M1-M3 cortex): 7 - Supraganglionic infarction (M4-M6 cortex): 3 Total score (0-10 with 10 being normal): 10 IMPRESSION: Evidence of distal left M1 thrombus. No hemorrhage or visible infarct. Aspects is 10. Electronically Signed   By: Monte Fantasia M.D.   On: 05/18/2017 16:33    ASSESSMENT / PLAN:  NEURO: GCS 8  Sedation with Propofol ggt RASS goal per Neuro Currently RASS 0 to -1 No seizures noted on my exam Withdraws to pain Neurochecks Q2 S.p revascularization with IR MRI/MRA scheduled by Neuro  UA noted positive for benzodiazepines  on presentation Pt has a h/o Anxiety and was on Rx meds   CARDIAC: Afib on anticoagulation Previously on Eliquis Held due to acute ischemic CVA and risk of hemorrhage  H/o HTN Was on amlodipine- valsartan and diltiazem as an outpatient Prn Cardene  Goal BP 120-140 ECHO pending  S/p revascularization BP goal systolic between 595-638 Cardene ordered for BP control  Currently not needed due to Propofol ggt   PULMONARY: Intubated CXR reviewed and ETT in good position ABG I-stat reviewed  Mechanical ventilation with PRVC TV 520 RR 15 FiO2 100 PEEP 5 Changed ventilator appropiately Wean and SBT when cleared by Neuro ( tentative plan for AM)  ID: No acute issues Pt has no signs of an active infection   Endocrine: H/o hyperparathyroidism  And known h/o Pituitary adenoma (dx 02/2015) Per external record review Reviewed medication reconciliation Not currently on any medication as an outpatient for this   GI: NPO No OGT  Tentative plan to extubate in AM Speech and swallow to follow  Heme: No signs of active bleeding Pt on anticoagulation with Eliquis as an outpatient Not a candidate for TPA No signs of active bleeding Any change in neurostatus will prompt emergent scan Any signs of bleeding may consider Andexxa If Hgb<7 transfuse PRBCs Plan for ntithrombotic therapy will depend on MRI and  procedure results   RENAL Baseline Cr 1.00 Current levels wnl Lab Results  Component Value Date   CREATININE 1.00 05/18/2017   CREATININE 1.03 (H) 05/18/2017   CREATININE 1.00 07/19/2014  hypokalemic  Corrected Calcium for Albumin 8.6 ionized 0.97 Noted h/o hyperparathyroidism in 2015/2016 Not receiving any medications      DISPOSITION: NEURO-ICU CODE STATUS: FULL FAMILY: present and informed. CC TIME: 45 mins    I, Dr Seward Carol have personally reviewed patient's available data, including medical history, events of note, physical examination and test results as part of my evaluation. I have discussed with the  care providers such as pharmacist, RN and RRT. The patient is critically ill with multiple organ systems failure and requires high complexity decision making for assessment and support, frequent evaluation and titration of therapies, application of advanced monitoring technologies and extensive interpretation of multiple databases.   Critical Care Time devoted to patient care services described in this note is 45 Minutes. This time reflects time of care of this signee Dr Seward Carol. This critical care time does not reflect procedure time, or teaching time but could involve care discussion time     Signed Dr Seward Carol Pulmonary Critical Care Locums [facility] [place] Pager: [phone]  05/18/2017, 9:00 PM

## 2017-05-18 NOTE — Anesthesia Postprocedure Evaluation (Signed)
Anesthesia Post Note  Patient: Amber Barnett  Procedure(s) Performed: Procedure(s) (LRB): RADIOLOGY WITH ANESTHESIA (N/A)     Patient location during evaluation: SICU Anesthesia Type: General Level of consciousness: sedated Pain management: pain level controlled Vital Signs Assessment: post-procedure vital signs reviewed and stable Respiratory status: patient remains intubated per anesthesia plan Cardiovascular status: stable Postop Assessment: no apparent nausea or vomiting Anesthetic complications: no    Last Vitals:  Vitals:   05/18/17 2043 05/18/17 2045  BP: (!) 90/58 (!) 83/59  Pulse:  (!) 38  Resp: 16 16  Temp:    SpO2:  100%    Last Pain: There were no vitals filed for this visit.               Burdette Forehand S

## 2017-05-18 NOTE — Progress Notes (Signed)
[facility] Progress Note Patient Name: Amber Barnett DOB: 02-06-1934 MRN: [id number]   Date of Service  05/18/2017  HPI/Events of Note  SUP  eICU Interventions  protonix     Intervention Category Intermediate Interventions: Best-practice therapies (e.g. DVT, beta blocker, etc.)  ALVA,RAKESH V. 05/18/2017, 10:49 PM

## 2017-05-18 NOTE — ED Provider Notes (Signed)
[facility] DEPT Provider Note   CSN: [id number] Arrival date & time: 05/18/17  1607     History   Chief Complaint Chief Complaint  Patient presents with  . Code Stroke    HPI Amber Barnett is a 83 y.o. female.  83 yo F with a chief complaint of aphasia and right-sided weakness. Level V caveat aphasia.   The history is provided by the patient.  Cerebrovascular Accident  This is a new problem. The current episode started 1 to 2 hours ago. The problem occurs constantly. The problem has not changed since onset.Nothing aggravates the symptoms. Nothing relieves the symptoms. She has tried nothing for the symptoms. The treatment provided no relief.    Past Medical History:  Diagnosis Date  . Hypertension     There are no active problems to display for this patient.   No past surgical history on file.  OB History    No data available       Home Medications    Prior to Admission medications   Medication Sig Start Date End Date Taking? Authorizing Provider  amLODipine-valsartan (EXFORGE) 5-160 MG per tablet Take 1 tablet by mouth daily.    [provider]  cefUROXime (CEFTIN) 250 MG tablet Take 250 mg by mouth 2 (two) times daily with a meal.    [provider]  LORazepam (ATIVAN) 1 MG tablet Take 1 mg by mouth every 8 (eight) hours.    [provider]    Family History No family history on file.  Social History Social History  Substance Use Topics  . Smoking status: Former Research scientist (life sciences)  . Smokeless tobacco: Not on file  . Alcohol use No     Allergies   Patient has no known allergies.   Review of Systems Review of Systems  Unable to perform ROS: Patient nonverbal     Physical Exam Updated Vital Signs SpO2 95%   Physical Exam  Constitutional: She appears well-developed and well-nourished. No distress.  HENT:  Head: Normocephalic and atraumatic.  Eyes: Pupils are equal, round, and reactive to light. EOM are normal.    Neck: Normal range of motion. Neck supple.  Cardiovascular: Normal rate and regular rhythm.  Exam reveals no gallop and no friction rub.   No murmur heard. Pulmonary/Chest: Effort normal. She has no wheezes. She has no rales.  Abdominal: Soft. She exhibits no distension. There is no tenderness.  Musculoskeletal: She exhibits no edema or tenderness.  Neurological: She is alert. GCS eye subscore is 4. GCS verbal subscore is 1. GCS motor subscore is 6. She displays no Babinski's sign on the right side. She displays no Babinski's sign on the left side.  Reflex Scores:      Patellar reflexes are 2+ on the right side and 2+ on the left side.      Achilles reflexes are 2+ on the right side and 2+ on the left side. Aphasic. Right upper extremity flaccidity. Right lower extremity strength 3 out of 5.  Skin: Skin is warm and dry. She is not diaphoretic.  Psychiatric: She has a normal mood and affect. Her behavior is normal.  Nursing note and vitals reviewed.    ED Treatments / Results  Labs (all labs ordered are listed, but only abnormal results are displayed) Labs Reviewed  I-STAT CHEM 8, ED - Abnormal; Notable for the following:       Result Value   Potassium 2.8 (*)    Calcium, Ion 0.97 (*)    All  other components within normal limits  ETHANOL  PROTIME-INR  APTT  CBC  DIFFERENTIAL  COMPREHENSIVE METABOLIC PANEL  RAPID URINE DRUG SCREEN, HOSP PERFORMED  URINALYSIS, ROUTINE W REFLEX MICROSCOPIC  I-STAT TROPONIN, ED  I-STAT CHEM 8, ED  I-STAT TROPONIN, ED    EKG  EKG Interpretation None       Radiology Ct Head Code Stroke Wo Contrast`  Result Date: 05/18/2017 CLINICAL DATA:  Code stroke.  Right facial weakness.  Absent speech. EXAM: CT HEAD WITHOUT CONTRAST TECHNIQUE: Contiguous axial images were obtained from the base of the skull through the vertex without intravenous contrast. COMPARISON:  None. FINDINGS: Brain: No evidence of acute infarction, hemorrhage, hydrocephalus,  extra-axial collection or mass lesion/mass effect. Vascular: Dense left distal M1 segment, especially on reformats. Skull: No acute or aggressive finding. Bulky right cervical facet arthropathy at C2-3 Sinuses/Orbits: No acute finding Other: Page to Dr Erlinda Hong placed 05/18/2017 at 4:30 pm. CT perfusion in progress. ASPECTS [facility] Stroke Program Early CT Score) - Ganglionic level infarction (caudate, lentiform nuclei, internal capsule, insula, M1-M3 cortex): 7 - Supraganglionic infarction (M4-M6 cortex): 3 Total score (0-10 with 10 being normal): 10 IMPRESSION: Evidence of distal left M1 thrombus. No hemorrhage or visible infarct. Aspects is 10. Electronically Signed   By: Monte Fantasia M.D.   On: 05/18/2017 16:33    Procedures Procedures (including critical care time)  Medications Ordered in ED Medications  iopamidol (ISOVUE-370) 76 % injection (not administered)  iopamidol (ISOVUE-370) 76 % injection (not administered)  nitroGLYCERIN 100 MCG/ML intra-arterial injection (not administered)  eptifibatide (INTEGRILIN) 20 MG/10ML injection (not administered)  iopamidol (ISOVUE-300) 61 % injection (not administered)  lidocaine (XYLOCAINE) 1 % (with pres) injection (not administered)     Initial Impression / Assessment and Plan / ED Course  I have reviewed the triage vital signs and the nursing notes.  Pertinent labs & imaging results that were available during my care of the patient were reviewed by me and considered in my medical decision making (see chart for details).     83 yo F With aphasia and right-sided weakness. Concern for an anterior circulation stroke. Deemed to be a possible IR candidate.   Taken urgently to IR suite.   CRITICAL CARE Performed by: Cecilio Asper   Total critical care time: 30 minutes  Critical care time was exclusive of separately billable procedures and treating other patients.  Critical care was necessary to treat or prevent imminent or  life-threatening deterioration.  Critical care was time spent personally by me on the following activities: development of treatment plan with patient and/or surrogate as well as nursing, discussions with consultants, evaluation of patient's response to treatment, examination of patient, obtaining history from patient or surrogate, ordering and performing treatments and interventions, ordering and review of laboratory studies, ordering and review of radiographic studies, pulse oximetry and re-evaluation of patient's condition.  The patients results and plan were reviewed and discussed.   Any x-rays performed were independently reviewed by myself.   Differential diagnosis were considered with the presenting HPI.  Medications  iopamidol (ISOVUE-370) 76 % injection (not administered)  iopamidol (ISOVUE-370) 76 % injection (not administered)  nitroGLYCERIN 100 MCG/ML intra-arterial injection (not administered)  eptifibatide (INTEGRILIN) 20 MG/10ML injection (not administered)  iopamidol (ISOVUE-300) 61 % injection (not administered)  lidocaine (XYLOCAINE) 1 % (with pres) injection (not administered)  fentaNYL (SUBLIMAZE) 100 MCG/2ML injection (not administered)    Vitals:   05/18/17 1620  SpO2: 95%    Final diagnoses:  Acute  embolic stroke [facility]    Admission/ observation were discussed with the admitting physician, patient and/or family and they are comfortable with the plan.   Final Clinical Impressions(s) / ED Diagnoses   Final diagnoses:  Acute embolic stroke [facility])    New Prescriptions New Prescriptions   No medications on file     Deno Etienne, DO 05/18/17 1710

## 2017-05-18 NOTE — Transfer of Care (Signed)
Immediate Anesthesia Transfer of Care Note  Patient: Amber Barnett  Procedure(s) Performed: Procedure(s): RADIOLOGY WITH ANESTHESIA (N/A)  Patient Location: PACU  Anesthesia Type:General  Level of Consciousness: sedated and Patient remains intubated per anesthesia plan  Airway & Oxygen Therapy: Patient Spontanous Breathing  Post-op Assessment: Report given to RN and Post -op Vital signs reviewed and stable  Post vital signs: Reviewed and stable  Last Vitals:  Vitals:   05/18/17 1620 05/18/17 1921  Resp:  19  Temp:  (!) 36.2 C  SpO2: 95%     Last Pain: There were no vitals filed for this visit.       Complications: No apparent anesthesia complications

## 2017-05-18 NOTE — Procedures (Signed)
S/p BILATERAL COMMON CAROTID ARTERIOGRAM. Rt cfa APPROACH. S/p  COMPLETE REVASCULARIZATION OF OCCLUDED lt mca m 1 SEG WITH X 1 PASS WITH 4MM X 40 MM SOLITAIRE fr RETRIEVER DEVICE ACHIEVING A TICI 3 REPERFUSION.

## 2017-05-19 ENCOUNTER — Inpatient Hospital Stay (HOSPITAL_COMMUNITY): Payer: Medicare HMO

## 2017-05-19 DIAGNOSIS — I6310 Cerebral infarction due to embolism of unspecified precerebral artery: Secondary | ICD-10-CM

## 2017-05-19 DIAGNOSIS — I63449 Cerebral infarction due to embolism of unspecified cerebellar artery: Secondary | ICD-10-CM

## 2017-05-19 LAB — PHOSPHORUS: Phosphorus: 2.5 mg/dL (ref 2.5–4.6)

## 2017-05-19 LAB — MAGNESIUM: MAGNESIUM: 1.4 mg/dL — AB (ref 1.7–2.4)

## 2017-05-19 LAB — CBC WITH DIFFERENTIAL/PLATELET
BASOS ABS: 0 10*3/uL (ref 0.0–0.1)
BASOS PCT: 0 %
EOS ABS: 0.1 10*3/uL (ref 0.0–0.7)
Eosinophils Relative: 1 %
HCT: 30.5 % — ABNORMAL LOW (ref 36.0–46.0)
Hemoglobin: 9.7 g/dL — ABNORMAL LOW (ref 12.0–15.0)
Lymphocytes Relative: 31 %
Lymphs Abs: 2.3 10*3/uL (ref 0.7–4.0)
MCH: 23.9 pg — ABNORMAL LOW (ref 26.0–34.0)
MCHC: 31.8 g/dL (ref 30.0–36.0)
MCV: 75.1 fL — ABNORMAL LOW (ref 78.0–100.0)
MONO ABS: 0.6 10*3/uL (ref 0.1–1.0)
Monocytes Relative: 8 %
NEUTROS ABS: 4.4 10*3/uL (ref 1.7–7.7)
Neutrophils Relative %: 60 %
Platelets: 180 10*3/uL (ref 150–400)
RBC: 4.06 MIL/uL (ref 3.87–5.11)
RDW: 17.8 % — ABNORMAL HIGH (ref 11.5–15.5)
WBC: 7.3 10*3/uL (ref 4.0–10.5)

## 2017-05-19 LAB — BASIC METABOLIC PANEL
ANION GAP: 9 (ref 5–15)
BUN: 7 mg/dL (ref 6–20)
CALCIUM: 7.9 mg/dL — AB (ref 8.9–10.3)
CO2: 20 mmol/L — ABNORMAL LOW (ref 22–32)
CREATININE: 0.81 mg/dL (ref 0.44–1.00)
Chloride: 112 mmol/L — ABNORMAL HIGH (ref 101–111)
Glucose, Bld: 81 mg/dL (ref 65–99)
Potassium: 3.7 mmol/L (ref 3.5–5.1)
SODIUM: 141 mmol/L (ref 135–145)

## 2017-05-19 LAB — LIPID PANEL
CHOLESTEROL: 107 mg/dL (ref 0–200)
HDL: 32 mg/dL — ABNORMAL LOW (ref >40)
LDL Cholesterol: 46 mg/dL (ref 0–99)
TRIGLYCERIDES: 144 mg/dL (ref <150)
Total CHOL/HDL Ratio: 3.3 RATIO
VLDL: 29 mg/dL (ref 0–40)

## 2017-05-19 LAB — MRSA PCR SCREENING: MRSA BY PCR: POSITIVE — AB

## 2017-05-19 LAB — HEMOGLOBIN A1C
HEMOGLOBIN A1C: 5.8 % — AB (ref 4.8–5.6)
MEAN PLASMA GLUCOSE: 119.76 mg/dL

## 2017-05-19 MED ORDER — MAGNESIUM SULFATE 2 GM/50ML IV SOLN: 2.0000 g | Freq: Once | INTRAVENOUS | Status: DC

## 2017-05-19 MED ORDER — SODIUM PHOSPHATES 45 MMOLE/15ML IV SOLN
20.0000 mmol | Freq: Once | INTRAVENOUS | Status: DC
  Filled 2017-05-19: qty 6.67

## 2017-05-19 MED ORDER — POTASSIUM CHLORIDE 10 MEQ/100ML IV SOLN
10.0000 meq | INTRAVENOUS | Status: AC
  Administered 2017-05-19 (×7): 10 meq via INTRAVENOUS
  Filled 2017-05-19 (×7): qty 100

## 2017-05-19 MED ORDER — POTASSIUM CHLORIDE 10 MEQ/50ML IV SOLN: 10.0000 meq | INTRAVENOUS | Status: DC

## 2017-05-19 MED ORDER — SODIUM PHOSPHATES 45 MMOLE/15ML IV SOLN: 30.0000 mmol | Freq: Once | INTRAVENOUS | Status: DC

## 2017-05-19 MED ORDER — PANTOPRAZOLE SODIUM 40 MG IV SOLR
40.0000 mg | INTRAVENOUS | Status: DC
  Administered 2017-05-19: 40 mg via INTRAVENOUS
  Filled 2017-05-19: qty 40

## 2017-05-19 MED ORDER — ORAL CARE MOUTH RINSE
15.0000 mL | Freq: Two times a day (BID) | OROMUCOSAL | Status: DC
  Administered 2017-05-19: 15 mL via OROMUCOSAL

## 2017-05-19 MED ORDER — MAGNESIUM SULFATE 50 % IJ SOLN
3.0000 g | Freq: Once | INTRAVENOUS | Status: AC
  Administered 2017-05-19: 3 g via INTRAVENOUS
  Filled 2017-05-19: qty 6

## 2017-05-19 NOTE — Progress Notes (Signed)
OT Cancellation Note  Patient Details Name: Amber Barnett MRN: [id number] DOB: 1934/02/23   Cancelled Treatment:    Reason Eval/Treat Not Completed: Patient not medically ready.  Pt extubated at 12:05.  Will reattempt.    [place], OTR/L [phone]   [name] 05/19/2017, 1:03 PM

## 2017-05-19 NOTE — Progress Notes (Signed)
PULMONARY CRITICAL CARE CONSULT   Name: Amber Barnett MRN: [id number] DOB: [birth year]    ADMISSION DATE:  05/18/2017   CONSULTATION DATE: 05/18/2017  REFERRING MD : Erlinda Hong MD  CHIEF COMPLAINT:  Acute Respiratory Failure  BRIEF PATIENT DESCRIPTION: 83 y.o. female with PMHx Left Breast CA s/p surgery, HTN, Afib, DVT on anticoagulation with eliquis presents after acute onset of aphasia and right sided weakness. CTH showed left M1 CVA. Now intubated s/p IR mechanical thrombectomy for revascularization.   SUBJECTIVE:  No acute events overnight. Status post mechanical thrombectomy. Patient more awake off sedation and following commands.  REVIEW OF SYSTEMS: Unable to obtain with patient intubated.   VITAL SIGNS: Temp:  [94.5 F (34.7 C)-98.3 F (36.8 C)] 98.3 F (36.8 C) (09/29 1022) Pulse Rate:  [26-114] 44 (09/29 1000) Resp:  [14-29] 18 (09/29 1000) BP: (83-153)/(54-109) 145/77 (09/29 1000) SpO2:  [95 %-100 %] 97 % (09/29 1000) Arterial Line BP: (98-167)/(43-80) 166/80 (09/29 1000) FiO2 (%):  [30 %-100 %] 30 % (09/29 0814) Weight:  [150 lb 12.7 oz (68.4 kg)-159 lb 6.3 oz (72.3 kg)] 159 lb 6.3 oz (72.3 kg) (09/29 0900)  PHYSICAL EXAMINATION: General:  Awake. No acute distress. Family member at bedside.  Integument:  Warm & dry. No rash on exposed skin. No bruising on exposed skin. Extremities:  No cyanosis or clubbing.  HEENT:  Moist mucus membranes. No scleral injection or icterus. Endotracheal tube in place.  Cardiovascular:  Regular rate. No edema. No appreciable JVD.  Pulmonary:  Clear bilaterally to auscultation. Intermittent cuff leak noted. Normal work of breathing on pressure support 0/5. Abdomen: Soft. Normal bowel sounds. Nondistended.  Musculoskeletal:  Normal bulk and tone. Hand grip strength 5/5 bilaterally. No joint deformity or effusion appreciated. Neurological: Nods to questions. Following commands. Grossly moving all 4 extremities equally.   Recent Labs Lab  05/18/17 1615 05/18/17 1620 05/19/17 0602  NA 139 143 141  K 2.9* 2.8* 3.7  CL 107 105 112*  CO2 21*  --  20*  BUN 12 12 7   CREATININE 1.03* 1.00 0.81  GLUCOSE 88 94 81    Recent Labs Lab 05/18/17 1615 05/18/17 1620 05/19/17 0602  HGB 10.8* 12.9 9.7*  HCT 34.1* 38.0 30.5*  WBC 9.4  --  7.3  PLT 218  --  180   Ct Angio Head W Or Wo Contrast  Result Date: 05/18/2017 CLINICAL DATA:  83 year old female with right facial droop and loss of speech, symptom onset about 1 hour ago. Noncontrast head CT suspicious for low left MCA large vessel occlusion EXAM: CT ANGIOGRAPHY HEAD AND NECK CT PERFUSION BRAIN TECHNIQUE: Multidetector CT imaging of the head and neck was performed using the standard protocol during bolus administration of intravenous contrast. Multiplanar CT image reconstructions and MIPs were obtained to evaluate the vascular anatomy. Carotid stenosis measurements (when applicable) are obtained utilizing NASCET criteria, using the distal internal carotid diameter as the denominator. Multiphase CT imaging of the brain was performed following IV bolus contrast injection. Subsequent parametric perfusion maps were calculated using RAPID software. CONTRAST:  90 mL Isovue 370 COMPARISON:  Noncontrast head CT 1629 hours today. FINDINGS: CT Brain Perfusion Findings: CBF (<30%) Volume: 0 mL in the left hemisphere; erroneous appearing 7 mL core infarct detected in the right hemisphere near the vertex. Perfusion (Tmax>6.0s) volume: 139 mL, the majority of which is in the left hemisphere Mismatch Volume:  Significant, at least 100 mL visually. Infarction Location:No core infarct detected in the left hemisphere. CTA NECK Skeleton:  Advanced degenerative changes in the cervical spine. No acute osseous abnormality identified. Upper chest: Negative lung apices. No superior mediastinal lymphadenopathy. Other neck: Negative aside from a partially retropharyngeal course of both carotid arteries. No cervical  lymphadenopathy. Aortic arch: 3 vessel arch configuration. Moderate soft and calcified arch atherosclerosis. Right carotid system: Negative to the bifurcation. Mild for age bifurcation soft and calcified plaque. Tortuous cervical right ICA with a partially retropharyngeal course, but no atherosclerotic stenosis. Left carotid system: Negative left CCA origin. Minimal plaque at the left carotid bifurcation. Partially retropharyngeal course and tortuosity of the left ICA without stenosis. Vertebral arteries: No proximal right subclavian artery stenosis. Normal right vertebral artery origin. Tortuous but otherwise negative right vertebral artery to the skullbase. No proximal subclavian artery stenosis despite soft plaque. Normal left vertebral artery origin. Tortuous but otherwise negative left vertebral artery to the skullbase. CTA HEAD Posterior circulation: Mild distal vertebral artery irregularity. No distal vertebral stenosis. Patent PICA origins and vertebrobasilar junction. Patent basilar artery without stenosis. Patent SCA origins. Normal left PCA origin. Fetal type right PCA origin. The left Posterior communicating artery is diminutive or absent. Bilateral PCA branches are within normal limits. Anterior circulation: Both ICA siphons are patent. Tortuosity and mild to moderate calcified plaque, greater on the left. Perhaps moderate but no high-grade left siphon stenosis. Normal right posterior communicating artery origin. Patent carotid termini. Normal MCA and ACA origins. Anterior communicating artery and bilateral ACA branches are within normal limits. Right MCA M1 segment, bifurcation, and right MCA branches are within normal limits. Left MCA M1 segment is occluded 12 mm beyond its origin. The left MCA bifurcation is occluded but there is left M2 and M3 collateral flow (series 11, image 19). Venous sinuses: Patent. Anatomic variants: Fetal right PCA origin. Review of the MIP images confirms the above findings  IMPRESSION: 1. Positive for emergent large vessel occlusion at the left MCA M1 segment. Favorable perfusion findings for Interventional revascularization: left hemisphere penumbra but no core infarct detected by CT perfusion. This was discussed by telephone with Dr. Erlinda Hong On 05/18/2017 at 1643 hours. 2. Bilateral ICA siphon calcified plaque is greater on the left but without high-grade stenosis. Tortuous cervical carotid arteries but mild for age carotid bifurcation atherosclerosis without stenosis. 3. Negative posterior circulation. Electronically Signed   By: Genevie Ann M.D.   On: 05/18/2017 16:59   Ct Head Wo Contrast  Result Date: 05/18/2017 CLINICAL DATA:  Status post complete revascularization of occluded LEFT middle cerebral artery. EXAM: CT HEAD WITHOUT CONTRAST TECHNIQUE: Contiguous axial images were obtained from the base of the skull through the vertex without intravenous contrast. COMPARISON:  CT HEAD September 28th 2018 at 1629 hours FINDINGS: BRAIN: No intraparenchymal hemorrhage, mass effect nor midline shift. The ventricles and sulci are normal for age. Patchy supratentorial white matter hypodensities within normal range for patient's age, though non-specific are most compatible with chronic small vessel ischemic disease. No acute large vascular territory infarcts. No contrast staining or abnormal intracranial enhancement. No abnormal extra-axial fluid collections. Basal cisterns are patent. VASCULAR: Moderate calcific atherosclerosis of the carotid siphons. Dural venous sinuses are patent. SKULL: No skull fracture. No significant scalp soft tissue swelling. SINUSES/ORBITS: Small LEFT sphenoid sinus air-fluid level. Mild paranasal sinus mucosal thickening. Small bilateral mastoid effusions. The included ocular globes and orbital contents are non-suspicious. Status post bilateral ocular lens implants with sub palpebral gas. OTHER: None. IMPRESSION: 1. Postprocedural intravascular contrast without acute  intracranial process ; otherwise negative CT HEAD for age. Electronically  Signed   By: Elon Alas M.D.   On: 05/18/2017 19:25   Ct Angio Neck W Or Wo Contrast  Result Date: 05/18/2017 CLINICAL DATA:  83 year old female with right facial droop and loss of speech, symptom onset about 1 hour ago. Noncontrast head CT suspicious for low left MCA large vessel occlusion EXAM: CT ANGIOGRAPHY HEAD AND NECK CT PERFUSION BRAIN TECHNIQUE: Multidetector CT imaging of the head and neck was performed using the standard protocol during bolus administration of intravenous contrast. Multiplanar CT image reconstructions and MIPs were obtained to evaluate the vascular anatomy. Carotid stenosis measurements (when applicable) are obtained utilizing NASCET criteria, using the distal internal carotid diameter as the denominator. Multiphase CT imaging of the brain was performed following IV bolus contrast injection. Subsequent parametric perfusion maps were calculated using RAPID software. CONTRAST:  90 mL Isovue 370 COMPARISON:  Noncontrast head CT 1629 hours today. FINDINGS: CT Brain Perfusion Findings: CBF (<30%) Volume: 0 mL in the left hemisphere; erroneous appearing 7 mL core infarct detected in the right hemisphere near the vertex. Perfusion (Tmax>6.0s) volume: 139 mL, the majority of which is in the left hemisphere Mismatch Volume:  Significant, at least 100 mL visually. Infarction Location:No core infarct detected in the left hemisphere. CTA NECK Skeleton: Advanced degenerative changes in the cervical spine. No acute osseous abnormality identified. Upper chest: Negative lung apices. No superior mediastinal lymphadenopathy. Other neck: Negative aside from a partially retropharyngeal course of both carotid arteries. No cervical lymphadenopathy. Aortic arch: 3 vessel arch configuration. Moderate soft and calcified arch atherosclerosis. Right carotid system: Negative to the bifurcation. Mild for age bifurcation soft and  calcified plaque. Tortuous cervical right ICA with a partially retropharyngeal course, but no atherosclerotic stenosis. Left carotid system: Negative left CCA origin. Minimal plaque at the left carotid bifurcation. Partially retropharyngeal course and tortuosity of the left ICA without stenosis. Vertebral arteries: No proximal right subclavian artery stenosis. Normal right vertebral artery origin. Tortuous but otherwise negative right vertebral artery to the skullbase. No proximal subclavian artery stenosis despite soft plaque. Normal left vertebral artery origin. Tortuous but otherwise negative left vertebral artery to the skullbase. CTA HEAD Posterior circulation: Mild distal vertebral artery irregularity. No distal vertebral stenosis. Patent PICA origins and vertebrobasilar junction. Patent basilar artery without stenosis. Patent SCA origins. Normal left PCA origin. Fetal type right PCA origin. The left Posterior communicating artery is diminutive or absent. Bilateral PCA branches are within normal limits. Anterior circulation: Both ICA siphons are patent. Tortuosity and mild to moderate calcified plaque, greater on the left. Perhaps moderate but no high-grade left siphon stenosis. Normal right posterior communicating artery origin. Patent carotid termini. Normal MCA and ACA origins. Anterior communicating artery and bilateral ACA branches are within normal limits. Right MCA M1 segment, bifurcation, and right MCA branches are within normal limits. Left MCA M1 segment is occluded 12 mm beyond its origin. The left MCA bifurcation is occluded but there is left M2 and M3 collateral flow (series 11, image 19). Venous sinuses: Patent. Anatomic variants: Fetal right PCA origin. Review of the MIP images confirms the above findings IMPRESSION: 1. Positive for emergent large vessel occlusion at the left MCA M1 segment. Favorable perfusion findings for Interventional revascularization: left hemisphere penumbra but no core  infarct detected by CT perfusion. This was discussed by telephone with Dr. Erlinda Hong On 05/18/2017 at 1643 hours. 2. Bilateral ICA siphon calcified plaque is greater on the left but without high-grade stenosis. Tortuous cervical carotid arteries but mild for age carotid bifurcation  atherosclerosis without stenosis. 3. Negative posterior circulation. Electronically Signed   By: Genevie Ann M.D.   On: 05/18/2017 16:59   Ct Cerebral Perfusion W Contrast  Result Date: 05/18/2017 CLINICAL DATA:  83 year old female with right facial droop and loss of speech, symptom onset about 1 hour ago. Noncontrast head CT suspicious for low left MCA large vessel occlusion EXAM: CT ANGIOGRAPHY HEAD AND NECK CT PERFUSION BRAIN TECHNIQUE: Multidetector CT imaging of the head and neck was performed using the standard protocol during bolus administration of intravenous contrast. Multiplanar CT image reconstructions and MIPs were obtained to evaluate the vascular anatomy. Carotid stenosis measurements (when applicable) are obtained utilizing NASCET criteria, using the distal internal carotid diameter as the denominator. Multiphase CT imaging of the brain was performed following IV bolus contrast injection. Subsequent parametric perfusion maps were calculated using RAPID software. CONTRAST:  90 mL Isovue 370 COMPARISON:  Noncontrast head CT 1629 hours today. FINDINGS: CT Brain Perfusion Findings: CBF (<30%) Volume: 0 mL in the left hemisphere; erroneous appearing 7 mL core infarct detected in the right hemisphere near the vertex. Perfusion (Tmax>6.0s) volume: 139 mL, the majority of which is in the left hemisphere Mismatch Volume:  Significant, at least 100 mL visually. Infarction Location:No core infarct detected in the left hemisphere. CTA NECK Skeleton: Advanced degenerative changes in the cervical spine. No acute osseous abnormality identified. Upper chest: Negative lung apices. No superior mediastinal lymphadenopathy. Other neck: Negative aside  from a partially retropharyngeal course of both carotid arteries. No cervical lymphadenopathy. Aortic arch: 3 vessel arch configuration. Moderate soft and calcified arch atherosclerosis. Right carotid system: Negative to the bifurcation. Mild for age bifurcation soft and calcified plaque. Tortuous cervical right ICA with a partially retropharyngeal course, but no atherosclerotic stenosis. Left carotid system: Negative left CCA origin. Minimal plaque at the left carotid bifurcation. Partially retropharyngeal course and tortuosity of the left ICA without stenosis. Vertebral arteries: No proximal right subclavian artery stenosis. Normal right vertebral artery origin. Tortuous but otherwise negative right vertebral artery to the skullbase. No proximal subclavian artery stenosis despite soft plaque. Normal left vertebral artery origin. Tortuous but otherwise negative left vertebral artery to the skullbase. CTA HEAD Posterior circulation: Mild distal vertebral artery irregularity. No distal vertebral stenosis. Patent PICA origins and vertebrobasilar junction. Patent basilar artery without stenosis. Patent SCA origins. Normal left PCA origin. Fetal type right PCA origin. The left Posterior communicating artery is diminutive or absent. Bilateral PCA branches are within normal limits. Anterior circulation: Both ICA siphons are patent. Tortuosity and mild to moderate calcified plaque, greater on the left. Perhaps moderate but no high-grade left siphon stenosis. Normal right posterior communicating artery origin. Patent carotid termini. Normal MCA and ACA origins. Anterior communicating artery and bilateral ACA branches are within normal limits. Right MCA M1 segment, bifurcation, and right MCA branches are within normal limits. Left MCA M1 segment is occluded 12 mm beyond its origin. The left MCA bifurcation is occluded but there is left M2 and M3 collateral flow (series 11, image 19). Venous sinuses: Patent. Anatomic  variants: Fetal right PCA origin. Review of the MIP images confirms the above findings IMPRESSION: 1. Positive for emergent large vessel occlusion at the left MCA M1 segment. Favorable perfusion findings for Interventional revascularization: left hemisphere penumbra but no core infarct detected by CT perfusion. This was discussed by telephone with Dr. Erlinda Hong On 05/18/2017 at 1643 hours. 2. Bilateral ICA siphon calcified plaque is greater on the left but without high-grade stenosis. Tortuous cervical carotid arteries  but mild for age carotid bifurcation atherosclerosis without stenosis. 3. Negative posterior circulation. Electronically Signed   By: Genevie Ann M.D.   On: 05/18/2017 16:59   Dg Chest Port 1 View  Result Date: 05/18/2017 CLINICAL DATA:  ETT placement EXAM: PORTABLE CHEST 1 VIEW COMPARISON:  None. FINDINGS: Endotracheal tube tip is 4.4 cm superior to the carina. Small left-sided pleural effusion with left basilar atelectasis or infiltrate. Mild cardiomegaly with central vascular congestion. Atherosclerotic calcifications of the aorta. No pneumothorax. IMPRESSION: 1. Endotracheal tube tip is 4.4 cm superior to the carina 2. Small left-sided pleural effusion with left basilar atelectasis or infiltrate 3. Mild cardiomegaly with central vascular congestion Electronically Signed   By: Donavan Foil M.D.   On: 05/18/2017 21:54   Ct Head Code Stroke Wo Contrast`  Result Date: 05/18/2017 CLINICAL DATA:  Code stroke.  Right facial weakness.  Absent speech. EXAM: CT HEAD WITHOUT CONTRAST TECHNIQUE: Contiguous axial images were obtained from the base of the skull through the vertex without intravenous contrast. COMPARISON:  None. FINDINGS: Brain: No evidence of acute infarction, hemorrhage, hydrocephalus, extra-axial collection or mass lesion/mass effect. Vascular: Dense left distal M1 segment, especially on reformats. Skull: No acute or aggressive finding. Bulky right cervical facet arthropathy at C2-3  Sinuses/Orbits: No acute finding Other: Page to Dr Erlinda Hong placed 05/18/2017 at 4:30 pm. CT perfusion in progress. ASPECTS [facility] Stroke Program Early CT Score) - Ganglionic level infarction (caudate, lentiform nuclei, internal capsule, insula, M1-M3 cortex): 7 - Supraganglionic infarction (M4-M6 cortex): 3 Total score (0-10 with 10 being normal): 10 IMPRESSION: Evidence of distal left M1 thrombus. No hemorrhage or visible infarct. Aspects is 10. Electronically Signed   By: Monte Fantasia M.D.   On: 05/18/2017 16:33   IMAGING/STUDIES: CT HEAD CODE STROKE 9/28:  Evidence of distal left M1 thrombus. No hemorrhage or visible infarct. Aspects is 10. CTA HEAD/NECK 9/28: IMPRESSION: 1. Positive for emergent large vessel occlusion at the left MCA M1 segment. Favorable perfusion findings for Interventional revascularization: left hemisphere penumbra but no core infarct detected by CT perfusion. This was discussed by telephone with Dr. Erlinda Hong On 05/18/2017 at 1643 hours. 2. Bilateral ICA siphon calcified plaque is greater on the left but without high-grade stenosis. Tortuous cervical carotid arteries but mild for age carotid bifurcation atherosclerosis without stenosis. 3. Negative posterior circulation. CT HEAD W/O 9/28:  Postprocedural intravascular contrast without acute intracranial process ; otherwise negative CT HEAD for age. PORT CXR 9/28:  Personally reviewed by me. Slight rotation of the films and left. Silhouetting left hemidiaphragm suggestive of atelectasis versus effusion versus opacity. Endotracheal tube in good position. MRI/MRA 9/29 >>> TTE 9/29 >>>  MICROBIOLOGY: MRSA PCR 9/28:  Positive   ANTIBIOTICS: None.  LINES/TUBES: OETT 9/28 >>> L RAD ART LINE 9/28 >>> Foley 9/28 >>> PIV  SIGNIFICANT EVENTS: 09/28 - Admit w/ left M1 CVA >> s/p IR embolectomy  ASSESSMENT / PLAN:  83 y.o. female status post acute ischemic CVA and embolectomy on 9/28. Patient with known underlying atrial  fibrillation. Intubation. Procedural for embolectomy.  1. Acute left MCA CVA: Status post embolectomy. Management per primary service and interventional radiology. 2. Acute respiratory failure: Intubated for airway protection periprocedural. Plan for probable extubation today. 3. Essential hypertension: Goal systolic blood pressure 329-518. Echocardiogram pending. Holding outpatient valsartan, Norvasc, and Cardizem. 4. Atrial fibrillation: Continuous telemetry monitoring. Holding systemic anticoagulation due to bleeding risk. 5. Hypomagnesemia: Magnesium sulfate 3 g IV. Repeat likely panel in a.m. 6. H/O DVT: Holding systemic anticoagulation  due to bleeding risk.  Prophylaxis: SCDs & Lovenox. Changing Protonix to 40 mg IV daily. Diet:  NPO. Holding on tube feedings anticipating extubation soon. Code Status:  Full Code per previous physician discussions. Disposition:  Remains in the ICU. Family Update:  Family updated at bedside at the time of my rounds.  I have personally spent a total of 33 minutes of critical care time today caring for the patient, updating family at bedside, & reviewing the patient's electronic medical record.  Sonia Baller Ashok Cordia, M.D. [facility] Pulmonary & Critical Care Pager:  [phone] After 3pm or if no response, call [phone] 05/19/2017, 11:22 AM

## 2017-05-19 NOTE — Progress Notes (Signed)
Pt placed back on full vent support due to RR in the 40's. RN notified.

## 2017-05-19 NOTE — Procedures (Signed)
Extubation Procedure Note  Patient Details:   Name: Amber Barnett DOB: 08-Dec-1933 MRN: [id number]   Airway Documentation:     Evaluation  O2 sats: stable throughout Complications: No apparent complications Patient did tolerate procedure well. Bilateral Breath Sounds: Clear, Diminished   Yes   Positive cuff leak - intermittent.  Re-intubation supplies ready if needed, CCM standing by.  Pt placed on [place] 3 L with humidity.  Good strong voice, tolerating [place] well at this time.  Pt able to reach 625 mL using incentive spirometer.  [name] 05/19/2017, 12:49 PM

## 2017-05-19 NOTE — Progress Notes (Signed)
Patient ID: Amber Barnett, female   DOB: 02-25-1934, 83 y.o.   MRN: [id number]    Referring Physician(s): Dr. Rosalin Hawking  Supervising Physician: Luanne Bras  Patient Status: [facility] - In-pt  Chief Complaint: CVA  Subjective: Awake on vent.  Moves all 4 and follows commands  Allergies: Fish-derived products; Lactuca virosa; Milk-related compounds; Other; and Amoxicillin  Medications: Prior to Admission medications   Medication Sig Start Date End Date Taking? Authorizing Provider  amLODipine-valsartan (EXFORGE) 5-320 MG tablet Take 1 tablet by mouth daily. 08/17/16  Yes [provider]  apixaban (ELIQUIS) 2.5 MG TABS tablet Take 2.5 mg by mouth 2 (two) times daily.   Yes [provider]  Cholecalciferol (VITAMIN D3) 2000 units capsule Take 2,000 Units by mouth daily.   Yes [provider]  diltiazem (TIAZAC) 120 MG 24 hr capsule Take 120 mg by mouth daily. 04/26/17  Yes [provider]  diphenhydrAMINE (BENADRYL) 25 mg capsule Take 25 mg by mouth daily as needed for itching (allergic reaction).    Yes [provider]  latanoprost (XALATAN) 0.005 % ophthalmic solution Place 1 drop into both eyes at bedtime. 10/16/16  Yes [provider]  LORazepam (ATIVAN) 1 MG tablet Take 1 mg by mouth every 8 (eight) hours as needed for anxiety.    Yes [provider]  Magnesium 250 MG TABS Take 250 mg by mouth daily.   Yes [provider]  pantoprazole (PROTONIX) 40 MG tablet Take 40 mg by mouth daily. 04/26/17  Yes [provider]  Potassium 99 MG TABS Take 99 mg by mouth daily.   Yes [provider]    Vital Signs: BP (!) 141/58   Pulse 86   Temp 98.3 F (36.8 C)   Resp 17   Ht 5\' 6"  (1.676 m)   Wt 159 lb 6.3 oz (72.3 kg)   SpO2 100%   BMI 25.73 kg/m   Physical Exam: Neuro: equal grip strength bilaterally.  Equal strength in LEs.  Pedal pulses palpable bilaterally Skin: R CFA site is  c/d/i  Imaging: Ct Angio Head W Or Wo Contrast  Result Date: 05/18/2017 CLINICAL DATA:  83 year old female with right facial droop and loss of speech, symptom onset about 1 hour ago. Noncontrast head CT suspicious for low left MCA large vessel occlusion EXAM: CT ANGIOGRAPHY HEAD AND NECK CT PERFUSION BRAIN TECHNIQUE: Multidetector CT imaging of the head and neck was performed using the standard protocol during bolus administration of intravenous contrast. Multiplanar CT image reconstructions and MIPs were obtained to evaluate the vascular anatomy. Carotid stenosis measurements (when applicable) are obtained utilizing NASCET criteria, using the distal internal carotid diameter as the denominator. Multiphase CT imaging of the brain was performed following IV bolus contrast injection. Subsequent parametric perfusion maps were calculated using RAPID software. CONTRAST:  90 mL Isovue 370 COMPARISON:  Noncontrast head CT 1629 hours today. FINDINGS: CT Brain Perfusion Findings: CBF (<30%) Volume: 0 mL in the left hemisphere; erroneous appearing 7 mL core infarct detected in the right hemisphere near the vertex. Perfusion (Tmax>6.0s) volume: 139 mL, the majority of which is in the left hemisphere Mismatch Volume:  Significant, at least 100 mL visually. Infarction Location:No core infarct detected in the left hemisphere. CTA NECK Skeleton: Advanced degenerative changes in the cervical spine. No acute osseous abnormality identified. Upper chest: Negative lung apices. No superior mediastinal lymphadenopathy. Other neck: Negative aside from a partially retropharyngeal course of both carotid arteries. No cervical lymphadenopathy. Aortic arch: 3 vessel  arch configuration. Moderate soft and calcified arch atherosclerosis. Right carotid system: Negative to the bifurcation. Mild for age bifurcation soft and calcified plaque. Tortuous cervical right ICA with a partially retropharyngeal course, but no atherosclerotic stenosis.  Left carotid system: Negative left CCA origin. Minimal plaque at the left carotid bifurcation. Partially retropharyngeal course and tortuosity of the left ICA without stenosis. Vertebral arteries: No proximal right subclavian artery stenosis. Normal right vertebral artery origin. Tortuous but otherwise negative right vertebral artery to the skullbase. No proximal subclavian artery stenosis despite soft plaque. Normal left vertebral artery origin. Tortuous but otherwise negative left vertebral artery to the skullbase. CTA HEAD Posterior circulation: Mild distal vertebral artery irregularity. No distal vertebral stenosis. Patent PICA origins and vertebrobasilar junction. Patent basilar artery without stenosis. Patent SCA origins. Normal left PCA origin. Fetal type right PCA origin. The left Posterior communicating artery is diminutive or absent. Bilateral PCA branches are within normal limits. Anterior circulation: Both ICA siphons are patent. Tortuosity and mild to moderate calcified plaque, greater on the left. Perhaps moderate but no high-grade left siphon stenosis. Normal right posterior communicating artery origin. Patent carotid termini. Normal MCA and ACA origins. Anterior communicating artery and bilateral ACA branches are within normal limits. Right MCA M1 segment, bifurcation, and right MCA branches are within normal limits. Left MCA M1 segment is occluded 12 mm beyond its origin. The left MCA bifurcation is occluded but there is left M2 and M3 collateral flow (series 11, image 19). Venous sinuses: Patent. Anatomic variants: Fetal right PCA origin. Review of the MIP images confirms the above findings IMPRESSION: 1. Positive for emergent large vessel occlusion at the left MCA M1 segment. Favorable perfusion findings for Interventional revascularization: left hemisphere penumbra but no core infarct detected by CT perfusion. This was discussed by telephone with Dr. Erlinda Hong On 05/18/2017 at 1643 hours. 2. Bilateral  ICA siphon calcified plaque is greater on the left but without high-grade stenosis. Tortuous cervical carotid arteries but mild for age carotid bifurcation atherosclerosis without stenosis. 3. Negative posterior circulation. Electronically Signed   By: Genevie Ann M.D.   On: 05/18/2017 16:59   Ct Head Wo Contrast  Result Date: 05/18/2017 CLINICAL DATA:  Status post complete revascularization of occluded LEFT middle cerebral artery. EXAM: CT HEAD WITHOUT CONTRAST TECHNIQUE: Contiguous axial images were obtained from the base of the skull through the vertex without intravenous contrast. COMPARISON:  CT HEAD September 28th 2018 at 1629 hours FINDINGS: BRAIN: No intraparenchymal hemorrhage, mass effect nor midline shift. The ventricles and sulci are normal for age. Patchy supratentorial white matter hypodensities within normal range for patient's age, though non-specific are most compatible with chronic small vessel ischemic disease. No acute large vascular territory infarcts. No contrast staining or abnormal intracranial enhancement. No abnormal extra-axial fluid collections. Basal cisterns are patent. VASCULAR: Moderate calcific atherosclerosis of the carotid siphons. Dural venous sinuses are patent. SKULL: No skull fracture. No significant scalp soft tissue swelling. SINUSES/ORBITS: Small LEFT sphenoid sinus air-fluid level. Mild paranasal sinus mucosal thickening. Small bilateral mastoid effusions. The included ocular globes and orbital contents are non-suspicious. Status post bilateral ocular lens implants with sub palpebral gas. OTHER: None. IMPRESSION: 1. Postprocedural intravascular contrast without acute intracranial process ; otherwise negative CT HEAD for age. Electronically Signed   By: Elon Alas M.D.   On: 05/18/2017 19:25   Ct Angio Neck W Or Wo Contrast  Result Date: 05/18/2017 CLINICAL DATA:  83 year old female with right facial droop and loss of speech, symptom onset about  1 hour ago.  Noncontrast head CT suspicious for low left MCA large vessel occlusion EXAM: CT ANGIOGRAPHY HEAD AND NECK CT PERFUSION BRAIN TECHNIQUE: Multidetector CT imaging of the head and neck was performed using the standard protocol during bolus administration of intravenous contrast. Multiplanar CT image reconstructions and MIPs were obtained to evaluate the vascular anatomy. Carotid stenosis measurements (when applicable) are obtained utilizing NASCET criteria, using the distal internal carotid diameter as the denominator. Multiphase CT imaging of the brain was performed following IV bolus contrast injection. Subsequent parametric perfusion maps were calculated using RAPID software. CONTRAST:  90 mL Isovue 370 COMPARISON:  Noncontrast head CT 1629 hours today. FINDINGS: CT Brain Perfusion Findings: CBF (<30%) Volume: 0 mL in the left hemisphere; erroneous appearing 7 mL core infarct detected in the right hemisphere near the vertex. Perfusion (Tmax>6.0s) volume: 139 mL, the majority of which is in the left hemisphere Mismatch Volume:  Significant, at least 100 mL visually. Infarction Location:No core infarct detected in the left hemisphere. CTA NECK Skeleton: Advanced degenerative changes in the cervical spine. No acute osseous abnormality identified. Upper chest: Negative lung apices. No superior mediastinal lymphadenopathy. Other neck: Negative aside from a partially retropharyngeal course of both carotid arteries. No cervical lymphadenopathy. Aortic arch: 3 vessel arch configuration. Moderate soft and calcified arch atherosclerosis. Right carotid system: Negative to the bifurcation. Mild for age bifurcation soft and calcified plaque. Tortuous cervical right ICA with a partially retropharyngeal course, but no atherosclerotic stenosis. Left carotid system: Negative left CCA origin. Minimal plaque at the left carotid bifurcation. Partially retropharyngeal course and tortuosity of the left ICA without stenosis. Vertebral  arteries: No proximal right subclavian artery stenosis. Normal right vertebral artery origin. Tortuous but otherwise negative right vertebral artery to the skullbase. No proximal subclavian artery stenosis despite soft plaque. Normal left vertebral artery origin. Tortuous but otherwise negative left vertebral artery to the skullbase. CTA HEAD Posterior circulation: Mild distal vertebral artery irregularity. No distal vertebral stenosis. Patent PICA origins and vertebrobasilar junction. Patent basilar artery without stenosis. Patent SCA origins. Normal left PCA origin. Fetal type right PCA origin. The left Posterior communicating artery is diminutive or absent. Bilateral PCA branches are within normal limits. Anterior circulation: Both ICA siphons are patent. Tortuosity and mild to moderate calcified plaque, greater on the left. Perhaps moderate but no high-grade left siphon stenosis. Normal right posterior communicating artery origin. Patent carotid termini. Normal MCA and ACA origins. Anterior communicating artery and bilateral ACA branches are within normal limits. Right MCA M1 segment, bifurcation, and right MCA branches are within normal limits. Left MCA M1 segment is occluded 12 mm beyond its origin. The left MCA bifurcation is occluded but there is left M2 and M3 collateral flow (series 11, image 19). Venous sinuses: Patent. Anatomic variants: Fetal right PCA origin. Review of the MIP images confirms the above findings IMPRESSION: 1. Positive for emergent large vessel occlusion at the left MCA M1 segment. Favorable perfusion findings for Interventional revascularization: left hemisphere penumbra but no core infarct detected by CT perfusion. This was discussed by telephone with Dr. Erlinda Hong On 05/18/2017 at 1643 hours. 2. Bilateral ICA siphon calcified plaque is greater on the left but without high-grade stenosis. Tortuous cervical carotid arteries but mild for age carotid bifurcation atherosclerosis without stenosis.  3. Negative posterior circulation. Electronically Signed   By: Genevie Ann M.D.   On: 05/18/2017 16:59   Ct Cerebral Perfusion W Contrast  Result Date: 05/18/2017 CLINICAL DATA:  83 year old female with right facial droop  and loss of speech, symptom onset about 1 hour ago. Noncontrast head CT suspicious for low left MCA large vessel occlusion EXAM: CT ANGIOGRAPHY HEAD AND NECK CT PERFUSION BRAIN TECHNIQUE: Multidetector CT imaging of the head and neck was performed using the standard protocol during bolus administration of intravenous contrast. Multiplanar CT image reconstructions and MIPs were obtained to evaluate the vascular anatomy. Carotid stenosis measurements (when applicable) are obtained utilizing NASCET criteria, using the distal internal carotid diameter as the denominator. Multiphase CT imaging of the brain was performed following IV bolus contrast injection. Subsequent parametric perfusion maps were calculated using RAPID software. CONTRAST:  90 mL Isovue 370 COMPARISON:  Noncontrast head CT 1629 hours today. FINDINGS: CT Brain Perfusion Findings: CBF (<30%) Volume: 0 mL in the left hemisphere; erroneous appearing 7 mL core infarct detected in the right hemisphere near the vertex. Perfusion (Tmax>6.0s) volume: 139 mL, the majority of which is in the left hemisphere Mismatch Volume:  Significant, at least 100 mL visually. Infarction Location:No core infarct detected in the left hemisphere. CTA NECK Skeleton: Advanced degenerative changes in the cervical spine. No acute osseous abnormality identified. Upper chest: Negative lung apices. No superior mediastinal lymphadenopathy. Other neck: Negative aside from a partially retropharyngeal course of both carotid arteries. No cervical lymphadenopathy. Aortic arch: 3 vessel arch configuration. Moderate soft and calcified arch atherosclerosis. Right carotid system: Negative to the bifurcation. Mild for age bifurcation soft and calcified plaque. Tortuous cervical  right ICA with a partially retropharyngeal course, but no atherosclerotic stenosis. Left carotid system: Negative left CCA origin. Minimal plaque at the left carotid bifurcation. Partially retropharyngeal course and tortuosity of the left ICA without stenosis. Vertebral arteries: No proximal right subclavian artery stenosis. Normal right vertebral artery origin. Tortuous but otherwise negative right vertebral artery to the skullbase. No proximal subclavian artery stenosis despite soft plaque. Normal left vertebral artery origin. Tortuous but otherwise negative left vertebral artery to the skullbase. CTA HEAD Posterior circulation: Mild distal vertebral artery irregularity. No distal vertebral stenosis. Patent PICA origins and vertebrobasilar junction. Patent basilar artery without stenosis. Patent SCA origins. Normal left PCA origin. Fetal type right PCA origin. The left Posterior communicating artery is diminutive or absent. Bilateral PCA branches are within normal limits. Anterior circulation: Both ICA siphons are patent. Tortuosity and mild to moderate calcified plaque, greater on the left. Perhaps moderate but no high-grade left siphon stenosis. Normal right posterior communicating artery origin. Patent carotid termini. Normal MCA and ACA origins. Anterior communicating artery and bilateral ACA branches are within normal limits. Right MCA M1 segment, bifurcation, and right MCA branches are within normal limits. Left MCA M1 segment is occluded 12 mm beyond its origin. The left MCA bifurcation is occluded but there is left M2 and M3 collateral flow (series 11, image 19). Venous sinuses: Patent. Anatomic variants: Fetal right PCA origin. Review of the MIP images confirms the above findings IMPRESSION: 1. Positive for emergent large vessel occlusion at the left MCA M1 segment. Favorable perfusion findings for Interventional revascularization: left hemisphere penumbra but no core infarct detected by CT perfusion.  This was discussed by telephone with Dr. Erlinda Hong On 05/18/2017 at 1643 hours. 2. Bilateral ICA siphon calcified plaque is greater on the left but without high-grade stenosis. Tortuous cervical carotid arteries but mild for age carotid bifurcation atherosclerosis without stenosis. 3. Negative posterior circulation. Electronically Signed   By: Genevie Ann M.D.   On: 05/18/2017 16:59   Dg Chest Port 1 View  Result Date: 05/18/2017 CLINICAL DATA:  ETT placement EXAM: PORTABLE CHEST 1 VIEW COMPARISON:  None. FINDINGS: Endotracheal tube tip is 4.4 cm superior to the carina. Small left-sided pleural effusion with left basilar atelectasis or infiltrate. Mild cardiomegaly with central vascular congestion. Atherosclerotic calcifications of the aorta. No pneumothorax. IMPRESSION: 1. Endotracheal tube tip is 4.4 cm superior to the carina 2. Small left-sided pleural effusion with left basilar atelectasis or infiltrate 3. Mild cardiomegaly with central vascular congestion Electronically Signed   By: Donavan Foil M.D.   On: 05/18/2017 21:54   Ct Head Code Stroke Wo Contrast`  Result Date: 05/18/2017 CLINICAL DATA:  Code stroke.  Right facial weakness.  Absent speech. EXAM: CT HEAD WITHOUT CONTRAST TECHNIQUE: Contiguous axial images were obtained from the base of the skull through the vertex without intravenous contrast. COMPARISON:  None. FINDINGS: Brain: No evidence of acute infarction, hemorrhage, hydrocephalus, extra-axial collection or mass lesion/mass effect. Vascular: Dense left distal M1 segment, especially on reformats. Skull: No acute or aggressive finding. Bulky right cervical facet arthropathy at C2-3 Sinuses/Orbits: No acute finding Other: Page to Dr Erlinda Hong placed 05/18/2017 at 4:30 pm. CT perfusion in progress. ASPECTS [facility] Stroke Program Early CT Score) - Ganglionic level infarction (caudate, lentiform nuclei, internal capsule, insula, M1-M3 cortex): 7 - Supraganglionic infarction (M4-M6 cortex): 3 Total score (0-10  with 10 being normal): 10 IMPRESSION: Evidence of distal left M1 thrombus. No hemorrhage or visible infarct. Aspects is 10. Electronically Signed   By: Monte Fantasia M.D.   On: 05/18/2017 16:33    Labs:  CBC:  Recent Labs  05/18/17 1615 05/18/17 1620 05/19/17 0602  WBC 9.4  --  7.3  HGB 10.8* 12.9 9.7*  HCT 34.1* 38.0 30.5*  PLT 218  --  180    COAGS:  Recent Labs  05/18/17 1615  INR 1.36  APTT 29    BMP:  Recent Labs  05/18/17 1615 05/18/17 1620 05/19/17 0602  NA 139 143 141  K 2.9* 2.8* 3.7  CL 107 105 112*  CO2 21*  --  20*  GLUCOSE 88 94 81  BUN 12 12 7   CALCIUM 8.5*  --  7.9*  CREATININE 1.03* 1.00 0.81  GFRNONAA 50*  --  >60  GFRAA 58*  --  >60    LIVER FUNCTION TESTS:  Recent Labs  05/18/17 1615  BILITOT 1.1  AST 29  ALT 24  ALKPHOS 77  PROT 7.6  ALBUMIN 3.9    Assessment and Plan: 1. CVA S/p  COMPLETE REVASCULARIZATION OF OCCLUDED LT MCA  Patient awake on vent and wants tube out Neurologically intact currently Stick site is clean and intact Further care per CCM and neuro  Electronically Signed: Yazlin Ekblad E 05/19/2017, 11:53 AM   I spent a total of 15 Minutes at the the patient's bedside AND on the patient's hospital floor or unit, greater than 50% of which was counseling/coordinating care for CVA

## 2017-05-19 NOTE — Progress Notes (Signed)
STROKE TEAM PROGRESS NOTE   HISTORY OF PRESENT ILLNESS (per record) Amber Barnett is an 83 y.o. female with PMH of Afib and DVT on eliquis, HTN, left breast cancer s/p surgery presented as code stroke for acute onset of aphasia and right neglect.  Pt was shopping with family at 3:50pm when she develop acute onset speech difficulty and right weakness. EMS called. On arrival, pt had left gaze, right hemiparesis, aphasia. Glucose 93 and BP 158/68. EKG did not show afib rhythm. Code stroke called. She was sent to CT at [facility]. No bleeding and ASPECT 10. CTA head and neck showed left M1 cut off and CTP large penumbra and almost no infarct core.   On exam, she still has global aphasia, but right arm and leg moving much improved, but still has drift. Still has right neglect, left gaze preference but eye able to cross midline. Tele showed afib. NIHSS = 17. IR notified and pt taken to IR suite.   As per family, pt is compliant with medication although they did not check her pill box everyday. At baseline, she is active and talking and walking.   LSN: 3:50pm tPA Given: No: taking eliquis   INTERVENTIONAL RADIOLOGY - Dr. Estanislado Pandy S/p  COMPLETE REVASCULARIZATION OF OCCLUDED lt mca m 1 SEG WITH X 1 PASS WITH 4MM X 40 MM SOLITAIRE fr RETRIEVER DEVICE ACHIEVING A TICI 3 REPERFUSION.  SUBJECTIVE (INTERVAL HISTORY) Her Daughter is at bedside. She sometimes misses her Eliquis or doesn't take it on time. Daughter fills up pillbox but sometimes Eliquis pill is still left from the previous day in the pill box. This happened recently right before the stroke.   OBJECTIVE Temp:  [94.5 F (34.7 C)-97.8 F (36.6 C)] 97.8 F (36.6 C) (09/29 0336) Pulse Rate:  [26-114] 81 (09/29 0804) Cardiac Rhythm: Atrial fibrillation (09/28 2015) Resp:  [14-28] 14 (09/29 0804) BP: (83-153)/(54-109) 130/72 (09/29 0804) SpO2:  [95 %-100 %] 100 % (09/29 0700) Arterial Line BP: (98-167)/(43-69) 99/43 (09/29 0700) FiO2 (%):   [30 %-100 %] 30 % (09/29 0804) Weight:  [150 lb 12.7 oz (68.4 kg)] 150 lb 12.7 oz (68.4 kg) (09/28 2037)  CBC:  Recent Labs Lab 05/18/17 1615 05/18/17 1620 05/19/17 0602  WBC 9.4  --  7.3  NEUTROABS 4.9  --  4.4  HGB 10.8* 12.9 9.7*  HCT 34.1* 38.0 30.5*  MCV 75.9*  --  75.1*  PLT 218  --  938    Basic Metabolic Panel:  Recent Labs Lab 05/18/17 1615 05/18/17 1620 05/19/17 0602  NA 139 143 141  K 2.9* 2.8* 3.7  CL 107 105 112*  CO2 21*  --  20*  GLUCOSE 88 94 81  BUN 12 12 7   CREATININE 1.03* 1.00 0.81  CALCIUM 8.5*  --  7.9*  MG  --   --  1.4*  PHOS  --   --  2.5    Lipid Panel:    Component Value Date/Time   CHOL 107 05/19/2017 0601   TRIG 144 05/19/2017 0601   HDL 32 (L) 05/19/2017 0601   CHOLHDL 3.3 05/19/2017 0601   VLDL 29 05/19/2017 0601   LDLCALC 46 05/19/2017 0601   HgbA1c:  Lab Results  Component Value Date   HGBA1C 5.8 (H) 05/19/2017   Urine Drug Screen:    Component Value Date/Time   LABOPIA NONE DETECTED 05/18/2017 2009   COCAINSCRNUR NONE DETECTED 05/18/2017 2009   LABBENZ POSITIVE (A) 05/18/2017 2009   AMPHETMU NONE DETECTED 05/18/2017  2009   THCU NONE DETECTED 05/18/2017 2009   LABBARB NONE DETECTED 05/18/2017 2009    Alcohol Level     Component Value Date/Time   ETH <10 05/18/2017 1615    IMAGING   Ct Angio Head W Or Wo Contrast  Ct Angio Neck W Or Wo Contrast Ct Cerebral Perfusion W Contrast 05/18/2017 IMPRESSION:  1. Positive for emergent large vessel occlusion at the left MCA M1 segment. Favorable perfusion findings for Interventional revascularization: left hemisphere penumbra but no core infarct detected by CT perfusion.  2. Bilateral ICA siphon calcified plaque is greater on the left but without high-grade stenosis. Tortuous cervical carotid arteries but mild for age carotid bifurcation atherosclerosis without stenosis.  3. Negative posterior circulation.   Ct Head Wo Contrast 05/18/2017 IMPRESSION:  Postprocedural  intravascular contrast without acute intracranial process ; otherwise negative CT HEAD for age.     Dg Chest Port 1 View 05/18/2017 IMPRESSION:  1. Endotracheal tube tip is 4.4 cm superior to the carina  2. Small left-sided pleural effusion with left basilar atelectasis or infiltrate  3. Mild cardiomegaly with central vascular congestion     Ct Head Code Stroke Wo Contrast` 05/18/2017 IMPRESSION:  Evidence of distal left M1 thrombus. No hemorrhage or visible infarct. Aspects is 10.     INTERVENTIONAL RADIOLOGY - Dr. Estanislado Pandy S/p  COMPLETE REVASCULARIZATION OF OCCLUDED lt mca m 1 SEG WITH X 1 PASS WITH 4MM X 40 MM SOLITAIRE fr RETRIEVER DEVICE ACHIEVING A TICI 3 REPERFUSION.    Transthoracic Echocardiogram - pending    PHYSICAL EXAM  Physical exam: Exam: Gen: Alert, intubated                    Eyes: Conjunctivae clear without exudates or hemorrhage  Neuro: Detailed Neurologic Exam  Speech:    Intubated Cognition:    Nods appropriately to simple questions Cranial Nerves:    The pupils are equal, round, and reactive to light. Blinks to threat on left, . Extraocular movements are intact. Trigeminal sensation is intact. Face appears symmetric, difficult due to intubation.  Hearing intact to voice.   Motor Observation:    no involuntary movements noted.    Strength:    Right hemiparesis      Sensation: w/d to stim      Vitals:   05/19/17 0900 05/19/17 1000 05/19/17 1022 05/19/17 1100  BP: 133/84 (!) 145/77  (!) 141/58  Pulse: 77 (!) 44  86  Resp: (!) 21 18  17   Temp:   98.3 F (36.8 C)   TempSrc:      SpO2: 100% 97%  100%  Weight: 159 lb 6.3 oz (72.3 kg)     Height: 5\' 6"  (1.676 m)          ASSESSMENT/PLAN [name] is a 83 y.o. female with history of  hypertension, left breast cancer, DVT, and atrial fibrillation on Eliquis, presenting with acute onset of aphasia, right neglect with left gaze preference and right hemiparesis. She did not  receive IV t-PA due to anticoagulation on Eliquis.   INTERVENTIONAL RADIOLOGY - Dr. Estanislado Pandy S/p  COMPLETE REVASCULARIZATION OF OCCLUDED lt mca m 1 SEG WITH X 1 PASS WITH 4MM X 40 MM SOLITAIRE fr RETRIEVER DEVICE ACHIEVING A TICI 3 REPERFUSION.   Stroke:  Left MCA stroke - embolic secondary to atrial fibrillation.  Resultant  Right hemiparesis  CT head - Evidence of distal left M1 thrombus.  MRI head - pending  MRA head -  pending  Carotid Doppler - Cerebral angiogram  2D Echo - pending  LDL - 46  HgbA1c - 5.8  VTE prophylaxis  - Lovenox  Diet NPO time specified  Eliquis (apixaban) daily prior to admission, now on aspirin 300 mg suppository daily  Patient counseled to be compliant with her antithrombotic medications, Suspicion for Eliquis noncompliance (See interval history for conversation with daughter)  Ongoing aggressive stroke risk factor management  Therapy recommendations: - pending  Disposition:  Pending  Hypertension  Stable  Permissive hypertension (OK if < 220/120) but gradually normalize in 5-7 days  Long-term BP goal normotensive  Hyperlipidemia  Home meds: No lipid lowering medications prior to admission  LDL 46, goal < 70    Other Stroke Risk Factors  Advanced age  Former cigarette smoker - quit  Other Active Problems  Anemia - 9.7 / 30.5  Hypomagnesemia - 1.4 - supplemented - recheck in a.m.  PAF - Eliquis PTA  Hospital day # 1  Personally examined patient and images, performed neuro exam,assessment and plan as stated above.  I have personally obtained the history, evaluated lab date, reviewed imaging studies and agree with radiology interpretations.    Sarina Ill, MD Stroke Neurology  To contact Stroke Continuity provider, please refer to [email or web address] After hours, contact General Neurology

## 2017-05-19 NOTE — Progress Notes (Signed)
PT Cancellation Note  Patient Details Name: Amber Barnett MRN: [id number] DOB: 11/11/1933   Cancelled Treatment:    Reason Eval/Treat Not Completed: Patient not medically ready. Patient remains on bedrest per orders, and was placed back on full vent support this am.  Will return at later date for PT evaluation when appropriate for patient.   Despina Pole 05/19/2017, 10:52 AM Carita Pian. Sanjuana Kava, [place] Pager [phone]

## 2017-05-20 ENCOUNTER — Encounter (HOSPITAL_COMMUNITY): Payer: Self-pay | Admitting: Interventional Radiology

## 2017-05-20 ENCOUNTER — Inpatient Hospital Stay (HOSPITAL_COMMUNITY): Payer: Medicare HMO

## 2017-05-20 DIAGNOSIS — I5030 Unspecified diastolic (congestive) heart failure: Secondary | ICD-10-CM

## 2017-05-20 LAB — ECHOCARDIOGRAM COMPLETE
CHL CUP RV SYS PRESS: 61 mmHg
CHL CUP TV REG PEAK VELOCITY: 374 cm/s
FS: 35 % (ref 28–44)
Height: 66 in
IV/PV OW: 0.93
LA ID, A-P, ES: 48 mm
LA diam end sys: 48 mm
LA vol A4C: 72.7 ml
LA vol index: 39.8 mL/m2
LADIAMINDEX: 2.64 cm/m2
LAVOL: 72.4 mL
LDCA: 2.27 cm2
LV PW d: 10.7 mm — AB (ref 0.6–1.1)
LVOTD: 17 mm
TRMAXVEL: 374 cm/s
Weight: 2550.28 oz

## 2017-05-20 LAB — CBC WITH DIFFERENTIAL/PLATELET
BASOS ABS: 0 10*3/uL (ref 0.0–0.1)
BASOS PCT: 0 %
EOS ABS: 0.2 10*3/uL (ref 0.0–0.7)
Eosinophils Relative: 2 %
HCT: 33.3 % — ABNORMAL LOW (ref 36.0–46.0)
HEMOGLOBIN: 10.6 g/dL — AB (ref 12.0–15.0)
Lymphocytes Relative: 26 %
Lymphs Abs: 2.3 10*3/uL (ref 0.7–4.0)
MCH: 24.1 pg — ABNORMAL LOW (ref 26.0–34.0)
MCHC: 31.8 g/dL (ref 30.0–36.0)
MCV: 75.7 fL — ABNORMAL LOW (ref 78.0–100.0)
Monocytes Absolute: 0.8 10*3/uL (ref 0.1–1.0)
Monocytes Relative: 9 %
NEUTROS PCT: 63 %
Neutro Abs: 5.6 10*3/uL (ref 1.7–7.7)
Platelets: 212 10*3/uL (ref 150–400)
RBC: 4.4 MIL/uL (ref 3.87–5.11)
RDW: 17.7 % — ABNORMAL HIGH (ref 11.5–15.5)
WBC: 8.8 10*3/uL (ref 4.0–10.5)

## 2017-05-20 LAB — RENAL FUNCTION PANEL
ALBUMIN: 3.2 g/dL — AB (ref 3.5–5.0)
ANION GAP: 9 (ref 5–15)
BUN: <5 mg/dL — ABNORMAL LOW (ref 6–20)
CALCIUM: 8 mg/dL — AB (ref 8.9–10.3)
CO2: 21 mmol/L — ABNORMAL LOW (ref 22–32)
Chloride: 108 mmol/L (ref 101–111)
Creatinine, Ser: 0.82 mg/dL (ref 0.44–1.00)
GFR calc non Af Amer: >60 mL/min (ref >60)
Glucose, Bld: 83 mg/dL (ref 65–99)
PHOSPHORUS: 2.7 mg/dL (ref 2.5–4.6)
Potassium: 3 mmol/L — ABNORMAL LOW (ref 3.5–5.1)
Sodium: 138 mmol/L (ref 135–145)

## 2017-05-20 LAB — MAGNESIUM: Magnesium: 1.8 mg/dL (ref 1.7–2.4)

## 2017-05-20 MED ORDER — DILTIAZEM HCL ER COATED BEADS 120 MG PO CP24
120.0000 mg | ORAL_CAPSULE | Freq: Every day | ORAL | Status: DC
  Administered 2017-05-20 – 2017-05-21 (×2): 120 mg via ORAL
  Filled 2017-05-20 (×2): qty 1

## 2017-05-20 MED ORDER — METOPROLOL TARTRATE 5 MG/5ML IV SOLN
5.0000 mg | Freq: Once | INTRAVENOUS | Status: AC
  Administered 2017-05-20: 5 mg via INTRAVENOUS

## 2017-05-20 MED ORDER — AMLODIPINE BESYLATE 5 MG PO TABS
5.0000 mg | ORAL_TABLET | Freq: Every day | ORAL | Status: DC
  Administered 2017-05-20 – 2017-05-21 (×2): 5 mg via ORAL
  Filled 2017-05-20 (×2): qty 1

## 2017-05-20 MED ORDER — PANTOPRAZOLE SODIUM 40 MG PO TBEC
40.0000 mg | DELAYED_RELEASE_TABLET | Freq: Every day | ORAL | Status: DC
  Administered 2017-05-20 – 2017-05-21 (×2): 40 mg via ORAL
  Filled 2017-05-20 (×2): qty 1

## 2017-05-20 MED ORDER — METOPROLOL TARTRATE 5 MG/5ML IV SOLN
INTRAVENOUS | Status: AC
Start: 2017-05-20 — End: 2017-05-20
  Filled 2017-05-20: qty 5

## 2017-05-20 MED ORDER — IRBESARTAN 300 MG PO TABS
300.0000 mg | ORAL_TABLET | Freq: Every day | ORAL | Status: DC
  Administered 2017-05-20 – 2017-05-21 (×2): 300 mg via ORAL
  Filled 2017-05-20: qty 2
  Filled 2017-05-20: qty 1

## 2017-05-20 MED ORDER — LORAZEPAM 0.5 MG PO TABS: 0.5000 mg | ORAL_TABLET | Freq: Three times a day (TID) | ORAL | Status: DC | PRN

## 2017-05-20 MED ORDER — LATANOPROST 0.005 % OP SOLN
1.0000 [drp] | Freq: Every day | OPHTHALMIC | Status: DC
Start: 2017-05-20 — End: 2017-05-21
  Administered 2017-05-20: 1 [drp] via OPHTHALMIC
  Filled 2017-05-20: qty 2.5

## 2017-05-20 MED ORDER — APIXABAN 2.5 MG PO TABS
2.5000 mg | ORAL_TABLET | Freq: Two times a day (BID) | ORAL | Status: DC
  Administered 2017-05-20 – 2017-05-21 (×3): 2.5 mg via ORAL
  Filled 2017-05-20 (×3): qty 1

## 2017-05-20 MED ORDER — POTASSIUM CHLORIDE 10 MEQ/100ML IV SOLN
10.0000 meq | INTRAVENOUS | Status: AC
  Administered 2017-05-20 (×4): 10 meq via INTRAVENOUS
  Filled 2017-05-20 (×4): qty 100

## 2017-05-20 NOTE — Evaluation (Signed)
Speech Language Pathology Evaluation Patient Details Name: Amber Barnett MRN: [id number] DOB: 11-15-1933 Today's Date: 05/20/2017 Time: 1438-     Problem List:  Patient Active Problem List   Diagnosis Date Noted  . Cerebral embolism with cerebral infarction 05/18/2017  . Stroke [facility]) 05/18/2017   Past Medical History:  Past Medical History:  Diagnosis Date  . Atrial fibrillation ([place])   . DVT (deep venous thrombosis) ([place])   . History of left breast cancer   . Hypertension    Past Surgical History:  Past Surgical History:  Procedure Laterality Date  . RADIOLOGY WITH ANESTHESIA N/A 05/18/2017   Procedure: RADIOLOGY WITH ANESTHESIA;  Surgeon: Luanne Bras, MD;  Location: [place];  Service: Radiology;  Laterality: N/A;   HPI:  83 y.o. female with PMHx Left Breast CA s/p surgery, HTN, Afib, DVT on anticoagulation with eliquis presents after acute onset of aphasia and right sided weakness. CTH showed left M1 CVA.Required intubation s/p IR mechanical thrombectomy for revascularization 9/28-9/29/18.    Assessment / Plan / Recommendation Clinical Impression  Pt presents with normal expressive/receptive language function; fluent speech; no dysarthria.  No difficulties with naming.  Good historian.  Communication is at baseline - no SLP f/u warranted - our services will sign off.     SLP Assessment  SLP Recommendation/Assessment: Patient does not need any further Speech Lanaguage Pathology Services    Follow Up Recommendations  None    Frequency and Duration           SLP Evaluation Cognition  Overall Cognitive Status: Within Functional Limits for tasks assessed Arousal/Alertness: Awake/alert Orientation Level: Oriented X4       Comprehension  Auditory Comprehension Overall Auditory Comprehension: Appears within functional limits for tasks assessed Visual Recognition/Discrimination Discrimination: Within Function Limits Reading Comprehension Reading Status:  Within funtional limits    Expression Expression Primary Mode of Expression: Verbal Verbal Expression Overall Verbal Expression: Appears within functional limits for tasks assessed Written Expression Dominant Hand: Right   Oral / Motor  Oral Motor/Sensory Function Overall Oral Motor/Sensory Function: Other (comment) (mild right CNVII asymmetry) Motor Speech Overall Motor Speech: Appears within functional limits for tasks assessed   GO                    Amber Barnett 05/20/2017, 3:49 PM

## 2017-05-20 NOTE — Evaluation (Signed)
Occupational Therapy Evaluation Patient Details Name: Amber Barnett MRN: [id number] DOB: 08/07/1934 Today's Date: 05/20/2017    History of Present Illness 83 y.o.female with PMHx Left Breast CA s/p surgery, HTN, Afib, DVT on anticoagulation with eliquis presents after acute onset of aphasia and right sided weakness. CTH showed left M1 CVA.Required intubation s/p IR mechanical thrombectomy for revascularization 9/28-9/29/18.   Clinical Impression   PTA, pt was independent with ADL and functional mobility. She currently requires overall close supervision for ADL participation due to instability when standing and decreased activity tolerance for ADL. Pt additionally with rapidly fluctuating HR in A-fib and up to 160 seated at EOB at one point during session but quickly dropping to 115. Notified RN who is monitoring. Pt would benefit from continued OT services while admitted to improve independence and safety with ADL and functional mobility prior to D/C home with assistance from family and friends. Do not anticipate need for further OT follow-up post-acute D/C. Will continue to follow while admitted.    Follow Up Recommendations  No OT follow up;Supervision/Assistance - 24 hour    Equipment Recommendations  3 in 1 bedside commode;Tub/shower seat    Recommendations for Other Services       Precautions / Restrictions Precautions Precautions: Fall;Other (comment) Precaution Comments: Watch HR and BP Restrictions Weight Bearing Restrictions: No      Mobility Bed Mobility Overal bed mobility: Needs Assistance Bed Mobility: Supine to Sit     Supine to sit: Supervision     General bed mobility comments: Supervision for safety. Pt denies dizziness.   Transfers Overall transfer level: Needs assistance Equipment used: None Transfers: Sit to/from Stand Sit to Stand: Supervision         General transfer comment: Supervision for safety. Pt with rapidly fluctuating HR up to 160  at one point and wuickly back down to 115 in A-fib. Notified RN    Balance Overall balance assessment: Needs assistance Sitting-balance support: No upper extremity supported;Feet supported Sitting balance-Leahy Scale: Good     Standing balance support: No upper extremity supported Standing balance-Leahy Scale: Fair Standing balance comment: Supervision for safety.                            ADL either performed or assessed with clinical judgement   ADL Overall ADL's : Needs assistance/impaired                                       General ADL Comments: Overall supervision for safety with tasks. VC's for safety and to complete tasks slowly.      Vision   Vision Assessment?: No apparent visual deficits     Perception     Praxis      Pertinent Vitals/Pain Pain Assessment: No/denies pain     Hand Dominance Right   Extremity/Trunk Assessment Upper Extremity Assessment Upper Extremity Assessment: Overall WFL for tasks assessed   Lower Extremity Assessment Lower Extremity Assessment: Generalized weakness       Communication Communication Communication: No difficulties   Cognition Arousal/Alertness: Awake/alert Behavior During Therapy: WFL for tasks assessed/performed Overall Cognitive Status: Within Functional Limits for tasks assessed                                     General Comments  Exercises     Shoulder Instructions      Home Living Family/patient expects to be discharged to:: Private residence Living Arrangements: Non-relatives/Friends Available Help at Discharge: Family;Friend(s);Available 24 hours/day Type of Home: House Home Access: Stairs to enter [employer] of Steps: 2 Entrance Stairs-Rails: Right Home Layout: One level               Home Equipment: Cane - single point;Walker - 2 wheels (equipment was her husbands (deceased))   Additional Comments: Need to further assess  for bathroom set-up  Lives With: Friend(s)    Prior Functioning/Environment Level of Independence: Independent        Comments: still drives        OT Problem List: Decreased activity tolerance;Impaired balance (sitting and/or standing);Decreased safety awareness;Decreased knowledge of use of DME or AE;Decreased knowledge of precautions;Cardiopulmonary status limiting activity      OT Treatment/Interventions: Self-care/ADL training;Therapeutic exercise;Energy conservation;DME and/or AE instruction;Therapeutic activities;Patient/family education;Balance training    OT Goals(Current goals can be found in the care plan section) Acute Rehab OT Goals Patient Stated Goal: return to her prior level of activity OT Goal Formulation: With patient Time For Goal Achievement: 06/03/17 Potential to Achieve Goals: Good ADL Goals Pt Will Perform Grooming: with modified independence;standing Pt Will Perform Upper Body Dressing: with modified independence;sitting Pt Will Perform Lower Body Dressing: with modified independence;sit to/from stand Pt Will Transfer to Toilet: with modified independence;ambulating;regular height toilet Pt Will Perform Toileting - Clothing Manipulation and hygiene: with modified independence;sit to/from stand;sitting/lateral leans Pt Will Perform Tub/Shower Transfer: with modified independence;Tub transfer;Shower transfer;ambulating;3 in 1  OT Frequency: Min 2X/week   Barriers to D/C:            Co-evaluation              AM-PAC PT "6 Clicks" Daily Activity     Outcome Measure Help from another person eating meals?: None Help from another person taking care of personal grooming?: A Little Help from another person toileting, which includes using toliet, bedpan, or urinal?: A Little Help from another person bathing (including washing, rinsing, drying)?: A Little Help from another person to put on and taking off regular upper body clothing?: None Help from  another person to put on and taking off regular lower body clothing?: A Little 6 Click Score: 20   End of Session Nurse Communication: Mobility status (Vitals as above)  Activity Tolerance: Patient tolerated treatment well Patient left: in bed;with call bell/phone within reach;with bed alarm set;with family/visitor present  OT Visit Diagnosis: Unsteadiness on feet (R26.81);Muscle weakness (generalized) (M62.81)                Time: 5726-2035 OT Time Calculation (min): 11 min Charges:  OT General Charges $OT Visit: 1 Visit OT Evaluation $OT Eval Low Complexity: 1 Low G-Codes:     Norman Herrlich, MS OTR/L  Pager: Amber Barnett 05/20/2017, 4:07 PM

## 2017-05-20 NOTE — Progress Notes (Signed)
  Echocardiogram 2D Echocardiogram has been performed.  Amber Barnett 05/20/2017, 3:16 PM

## 2017-05-20 NOTE — Code Documentation (Signed)
83 y.o. female with PMHx of Afib and DVT on eliquis, HTN, left breast cancer s/p sx presented to [facility] ED as code stroke for acute onset of aphasia and righ-sided neglect. Pt was shopping with family at 1550 when she develop acute onset of speech difficulty and right-sided weakness. EMS called. On arrival, pt had left gaze, right hemiparesis, aphasia. Glucose 93 and BP 158/68. EKG did not show afib rhythm. Code stroke called.    On arrival to [facility] ED, the patient was met at the bridge by the stroke team, airway cleared, labs drawn and patient taken to CT. CT with no acute intracranial abnormalities. ASPECT 10. CTA head and neck showed left M1 cut off and CTP large penumbra and almost no infarct core.    NIHSS 16. VAN positive. See EMR for NIHSS and code stroke times. On exam, she still has global aphasia, but right arm and leg moving has improved, but still has drift. Still has right neglect, left gaze preference but eye able to cross midline. Tele showed afib on monitor. IV tPA not givien d/t eliquis. Case discussed with IR MD. Patient is IR candidate. Patient taken to IR by ED RN and SRN. IR bedside handoff with IR RN Dyann Ruddle.

## 2017-05-20 NOTE — Progress Notes (Signed)
STROKE TEAM PROGRESS NOTE   HISTORY OF PRESENT ILLNESS (per record) Amber Barnett is an 83 y.o. female with PMH of Afib and DVT on eliquis, HTN, left breast cancer s/p surgery presented as code stroke for acute onset of aphasia and right neglect.  Pt was shopping with family at 3:50pm when she develop acute onset speech difficulty and right weakness. EMS called. On arrival, pt had left gaze, right hemiparesis, aphasia. Glucose 93 and BP 158/68. EKG did not show afib rhythm. Code stroke called. She was sent to CT at [facility]. No bleeding and ASPECT 10. CTA head and neck showed left M1 cut off and CTP large penumbra and almost no infarct core.   On exam, she still has global aphasia, but right arm and leg moving much improved, but still has drift. Still has right neglect, left gaze preference but eye able to cross midline. Tele showed afib. NIHSS = 17. IR notified and pt taken to IR suite.   As per family, pt is compliant with medication although they did not check her pill box everyday. At baseline, she is active and talking and walking.   LSN: 3:50pm tPA Given: No: taking eliquis   INTERVENTIONAL RADIOLOGY - Amber Barnett S/p  COMPLETE REVASCULARIZATION OF OCCLUDED lt mca m 1 SEG WITH X 1 PASS WITH 4MM X 40 MM SOLITAIRE fr RETRIEVER DEVICE ACHIEVING A TICI 3 REPERFUSION.  SUBJECTIVE (INTERVAL HISTORY) Amber Barnett and Amber Barnett at bedside. She feels well today with no residual deficit that she can notice. On discussion, she sometimes misses her Eliquis or doesn't take it on time. This happened recently including the day before presenting with this stroke. She has walked around the halls with physical therapy without incident.   OBJECTIVE Temp:  [98 F (36.7 C)-98.5 F (36.9 C)] 98.3 F (36.8 C) (10/01 1305) Pulse Rate:  [61-95] 86 (10/01 1305) Cardiac Rhythm: Atrial fibrillation (10/01 0700) Resp:  [16-20] 20 (10/01 1305) BP: (118-136)/(53-72) 136/71 (10/01 1305) SpO2:  [97 %-100 %] 100 %  (10/01 1305)  CBC:   Recent Labs Lab 05/19/17 0602 05/20/17 0245  WBC 7.3 8.8  NEUTROABS 4.4 5.6  HGB 9.7* 10.6*  HCT 30.5* 33.3*  MCV 75.1* 75.7*  PLT 180 751    Basic Metabolic Panel:   Recent Labs Lab 05/19/17 0602 05/20/17 0245 05/21/17 0524  NA 141 138 134*  K 3.7 3.0* 3.7  CL 112* 108 104  CO2 20* 21* 22  GLUCOSE 81 83 86  BUN 7 <5* 11  CREATININE 0.81 0.82 1.16*  CALCIUM 7.9* 8.0* 8.6*  MG 1.4* 1.8  --   PHOS 2.5 2.7  --     Lipid Panel:     Component Value Date/Time   CHOL 107 05/19/2017 0601   TRIG 144 05/19/2017 0601   HDL 32 (L) 05/19/2017 0601   CHOLHDL 3.3 05/19/2017 0601   VLDL 29 05/19/2017 0601   LDLCALC 46 05/19/2017 0601   HgbA1c:  Lab Results  Component Value Date   HGBA1C 5.8 (H) 05/19/2017   Urine Drug Screen:     Component Value Date/Time   LABOPIA NONE DETECTED 05/18/2017 2009   COCAINSCRNUR NONE DETECTED 05/18/2017 2009   LABBENZ POSITIVE (A) 05/18/2017 2009   AMPHETMU NONE DETECTED 05/18/2017 2009   THCU NONE DETECTED 05/18/2017 2009   LABBARB NONE DETECTED 05/18/2017 2009    Alcohol Level     Component Value Date/Time   ETH <10 05/18/2017 1615    IMAGING   Ct Angio Head W  Or Wo Contrast  Ct Angio Neck W Or Wo Contrast Ct Cerebral Perfusion W Contrast 05/18/2017 IMPRESSION:  1. Positive for emergent large vessel occlusion at the left MCA M1 segment. Favorable perfusion findings for Interventional revascularization: left hemisphere penumbra but no core infarct detected by CT perfusion.  2. Bilateral ICA siphon calcified plaque is greater on the left but without high-grade stenosis. Tortuous cervical carotid arteries but mild for age carotid bifurcation atherosclerosis without stenosis.  3. Negative posterior circulation.   Ct Head Wo Contrast 05/18/2017 IMPRESSION:  Postprocedural intravascular contrast without acute intracranial process ; otherwise negative CT HEAD for age.   Dg Chest Port 1  View 05/18/2017 IMPRESSION:  1. Endotracheal tube tip is 4.4 cm superior to the carina  2. Small left-sided pleural effusion with left basilar atelectasis or infiltrate  3. Mild cardiomegaly with central vascular congestion   Ct Head Code Stroke Wo Contrast` 05/18/2017 IMPRESSION:  Evidence of distal left M1 thrombus. No hemorrhage or visible infarct. Aspects is 10.   INTERVENTIONAL RADIOLOGY - Amber Barnett S/p  COMPLETE REVASCULARIZATION OF OCCLUDED lt mca m 1 SEG WITH X 1 PASS WITH 4MM X 40 MM SOLITAIRE fr RETRIEVER DEVICE ACHIEVING A TICI 3 REPERFUSION.  Transthoracic Echocardiogram - 05/20/17 Impressions: The Amber Barnett was in atrial fibrillation. Normal LV size with EF 55-60%. D-shaped interventricular septum suggestive of RV pressure/volume overload. Moderately dilated RV with mildly decreased systolic function. Moderate MR. Moderate to severe TR. Biatrial enlargement. Moderate pulmonary hypertension.  PHYSICAL EXAM  Physical exam: Exam: Gen: Alert, comfortable, sitting in bedside chair                  Eyes: Conjunctivae clear without exudates or hemorrhage  Neuro: Detailed Neurologic Exam Speech:    Intubated Cognition:    Nods appropriately to simple questions Cranial Nerves:    Extraocular movements are intact. Face appears symmetric. Hearing intact. Motor:   No involuntary movements noted, 5/5 strength bilaterally Sensation:   Reports normal sensation in all extremities      Vitals:   05/21/17 0500 05/21/17 0957 05/21/17 1032 05/21/17 1305  BP: 133/72 (!) 118/59  136/71  Pulse: 83 80 95 86  Resp: 18  20 20   Temp: 98 F (36.7 C)  98.3 F (36.8 C) 98.3 F (36.8 C)  TempSrc: Oral  Oral Oral  SpO2: 97%  100% 100%  Weight:      Height:           ASSESSMENT/PLAN [name] is a 83 y.o. female with history of  hypertension, left breast cancer, DVT, and atrial fibrillation on Eliquis, presenting with acute onset of aphasia, right neglect with left  gaze preference and right hemiparesis. She did not receive IV t-PA due to anticoagulation on Eliquis.   INTERVENTIONAL RADIOLOGY - Amber Barnett S/p  COMPLETE REVASCULARIZATION OF OCCLUDED lt mca m 1 SEG WITH X 1 PASS WITH 4MM X 40 MM SOLITAIRE fr RETRIEVER DEVICE ACHIEVING A TICI 3 REPERFUSION.   Stroke:  Left MCA stroke - embolic secondary to atrial fibrillation.  Resultant  Right hemiparesis  CT head - Evidence of distal left M1 thrombus.  MRI head - Punctate left temporal-occipital infarct  MRA head - Patent L MCA after revascularization  Carotid Doppler - Cerebral angiogram  2D Echo - LVEF 55-60%, no evidence of embolic source  LDL - 46  HgbA1c - 5.8  VTE prophylaxis  - Lovenox Diet Heart Room service appropriate? Yes; Fluid consistency: Thin  Eliquis (apixaban) daily prior  to admission, now on apixaban but wants to change to xarelto for convenience of once daily dosing  Amber Barnett counseled to be compliant with her antithrombotic medications, Suspicion for Eliquis noncompliance (See interval history for conversation with Amber Barnett)  Ongoing aggressive stroke risk factor management  Therapy recommendations: - outpatient PT, 24 hr supervision  Disposition:  To home with Amber Barnett for assistance  Hypertension  Stable  Permissive hypertension (OK if < 220/120) but gradually normalize in 5-7 days  Long-term BP goal normotensive  Hyperlipidemia  Home meds: No lipid lowering medications prior to admission  LDL 46, goal < 70  Other Stroke Risk Factors  Advanced age  Former cigarette smoker - quit  Other Active Problems  Anemia - stable Hgb 10.6  Hypomagnesemia - replaced, 1.8  PAF - Eliquis PTA  Hospital day # 3  I have personally examined this Amber Barnett, reviewed notes, independently viewed imaging studies, participated in medical decision making and plan of care.ROS completed by me personally and pertinent positives fully documented  I have made any additions  or clarifications directly to the above note.  I had a long discussion with the Amber Barnett, Amber Barnett and Amber Barnett with regards to any need for being compliant with her medications to avoid a recurrent of her present situation. She finds it more convenient to take a medication once a day and would prefer changing eliquis to Xarelto. Have counseled the Amber Barnett to take Xarelto with a proper meals and consistently at the same time every day she and family voice understanding. Plan to discharge her home today with outpatient therapy and follow-up in the stroke clinic in 6 weeks. Follow-up with her cardiologist as needed. Amber Contras, MD Medical Director [facility] Pager: [phone] 05/21/2017 3:50 PM   To contact Stroke Continuity provider, please refer to [email or web address] After hours, contact General Neurology

## 2017-05-20 NOTE — Progress Notes (Addendum)
PULMONARY CRITICAL CARE CONSULT   Name: Amber Barnett MRN: [id number] DOB: [birth year]    ADMISSION DATE:  05/18/2017   CONSULTATION DATE: 05/18/2017  REFERRING MD : Erlinda Hong MD  CHIEF COMPLAINT:  Acute Respiratory Failure  BRIEF PATIENT DESCRIPTION: 83 y.o. female with PMHx Left Breast CA s/p surgery, HTN, Afib, DVT on anticoagulation with eliquis presents after acute onset of aphasia and right sided weakness. CTH showed left M1 CVA. Now intubated s/p IR mechanical thrombectomy for revascularization.   SUBJECTIVE:  Extubated yesterday. No acute events overnight. Patient's arterial line became unreliable and thus was removed overnight. Denies any dyspnea or cough. No chest pain or pressure.  REVIEW OF SYSTEMS: No abdominal pain or nausea. No subjective fever or chills.  VITAL SIGNS: Temp:  [96.6 F (35.9 C)-98.7 F (37.1 C)] 98.7 F (37.1 C) (09/30 0408) Pulse Rate:  [34-110] 85 (09/30 0500) Resp:  [10-29] 22 (09/30 0500) BP: (99-173)/(58-115) 137/82 (09/30 0500) SpO2:  [93 %-100 %] 96 % (09/30 0500) Arterial Line BP: (99-182)/(43-130) 112/106 (09/30 0030) FiO2 (%):  [30 %] 30 % (09/29 0814) Weight:  [72.3 kg (159 lb 6.3 oz)] 72.3 kg (159 lb 6.3 oz) (09/29 0900)  PHYSICAL EXAMINATION: General:  Watching TV. Family member at bedside. No distress. Integument:  Warm & dry. No rash on exposed skin. No bruising on exposed skin. Extremities:  No cyanosis or clubbing.  HEENT:  Moist mucus membranes. No scleral icterus. No scleral injection Cardiovascular:  Regular rate with irregular rhythm. No edema. No appreciable JVD.  Pulmonary:  Normal work of breathing on room air. Clear with auscultation bilaterally. Abdomen: Nondistended. Normal bowel sounds. Soft. Nontender.. Musculoskeletal:  Normal bulk and tone. Hand grip, dorsiflexion, and plantar flexion strength 5/5 bilaterally. No joint deformity or effusion appreciated. Neurological: Cranial nerves II-12 grossly intact. Following  commands. Grossly nonfocal.   Recent Labs Lab 05/18/17 1615 05/18/17 1620 05/19/17 0602 05/20/17 0245  NA 139 143 141 138  K 2.9* 2.8* 3.7 3.0*  CL 107 105 112* 108  CO2 21*  --  20* 21*  BUN 12 12 7  <5*  CREATININE 1.03* 1.00 0.81 0.82  GLUCOSE 88 94 81 83    Recent Labs Lab 05/18/17 1615 05/18/17 1620 05/19/17 0602 05/20/17 0245  HGB 10.8* 12.9 9.7* 10.6*  HCT 34.1* 38.0 30.5* 33.3*  WBC 9.4  --  7.3 8.8  PLT 218  --  180 212   Ct Angio Head W Or Wo Contrast  Result Date: 05/18/2017 CLINICAL DATA:  83 year old female with right facial droop and loss of speech, symptom onset about 1 hour ago. Noncontrast head CT suspicious for low left MCA large vessel occlusion EXAM: CT ANGIOGRAPHY HEAD AND NECK CT PERFUSION BRAIN TECHNIQUE: Multidetector CT imaging of the head and neck was performed using the standard protocol during bolus administration of intravenous contrast. Multiplanar CT image reconstructions and MIPs were obtained to evaluate the vascular anatomy. Carotid stenosis measurements (when applicable) are obtained utilizing NASCET criteria, using the distal internal carotid diameter as the denominator. Multiphase CT imaging of the brain was performed following IV bolus contrast injection. Subsequent parametric perfusion maps were calculated using RAPID software. CONTRAST:  90 mL Isovue 370 COMPARISON:  Noncontrast head CT 1629 hours today. FINDINGS: CT Brain Perfusion Findings: CBF (<30%) Volume: 0 mL in the left hemisphere; erroneous appearing 7 mL core infarct detected in the right hemisphere near the vertex. Perfusion (Tmax>6.0s) volume: 139 mL, the majority of which is in the left hemisphere Mismatch Volume:  Significant,  at least 100 mL visually. Infarction Location:No core infarct detected in the left hemisphere. CTA NECK Skeleton: Advanced degenerative changes in the cervical spine. No acute osseous abnormality identified. Upper chest: Negative lung apices. No superior  mediastinal lymphadenopathy. Other neck: Negative aside from a partially retropharyngeal course of both carotid arteries. No cervical lymphadenopathy. Aortic arch: 3 vessel arch configuration. Moderate soft and calcified arch atherosclerosis. Right carotid system: Negative to the bifurcation. Mild for age bifurcation soft and calcified plaque. Tortuous cervical right ICA with a partially retropharyngeal course, but no atherosclerotic stenosis. Left carotid system: Negative left CCA origin. Minimal plaque at the left carotid bifurcation. Partially retropharyngeal course and tortuosity of the left ICA without stenosis. Vertebral arteries: No proximal right subclavian artery stenosis. Normal right vertebral artery origin. Tortuous but otherwise negative right vertebral artery to the skullbase. No proximal subclavian artery stenosis despite soft plaque. Normal left vertebral artery origin. Tortuous but otherwise negative left vertebral artery to the skullbase. CTA HEAD Posterior circulation: Mild distal vertebral artery irregularity. No distal vertebral stenosis. Patent PICA origins and vertebrobasilar junction. Patent basilar artery without stenosis. Patent SCA origins. Normal left PCA origin. Fetal type right PCA origin. The left Posterior communicating artery is diminutive or absent. Bilateral PCA branches are within normal limits. Anterior circulation: Both ICA siphons are patent. Tortuosity and mild to moderate calcified plaque, greater on the left. Perhaps moderate but no high-grade left siphon stenosis. Normal right posterior communicating artery origin. Patent carotid termini. Normal MCA and ACA origins. Anterior communicating artery and bilateral ACA branches are within normal limits. Right MCA M1 segment, bifurcation, and right MCA branches are within normal limits. Left MCA M1 segment is occluded 12 mm beyond its origin. The left MCA bifurcation is occluded but there is left M2 and M3 collateral flow (series  11, image 19). Venous sinuses: Patent. Anatomic variants: Fetal right PCA origin. Review of the MIP images confirms the above findings IMPRESSION: 1. Positive for emergent large vessel occlusion at the left MCA M1 segment. Favorable perfusion findings for Interventional revascularization: left hemisphere penumbra but no core infarct detected by CT perfusion. This was discussed by telephone with Dr. Erlinda Hong On 05/18/2017 at 1643 hours. 2. Bilateral ICA siphon calcified plaque is greater on the left but without high-grade stenosis. Tortuous cervical carotid arteries but mild for age carotid bifurcation atherosclerosis without stenosis. 3. Negative posterior circulation. Electronically Signed   By: Genevie Ann M.D.   On: 05/18/2017 16:59   Ct Head Wo Contrast  Result Date: 05/18/2017 CLINICAL DATA:  Status post complete revascularization of occluded LEFT middle cerebral artery. EXAM: CT HEAD WITHOUT CONTRAST TECHNIQUE: Contiguous axial images were obtained from the base of the skull through the vertex without intravenous contrast. COMPARISON:  CT HEAD September 28th 2018 at 1629 hours FINDINGS: BRAIN: No intraparenchymal hemorrhage, mass effect nor midline shift. The ventricles and sulci are normal for age. Patchy supratentorial white matter hypodensities within normal range for patient's age, though non-specific are most compatible with chronic small vessel ischemic disease. No acute large vascular territory infarcts. No contrast staining or abnormal intracranial enhancement. No abnormal extra-axial fluid collections. Basal cisterns are patent. VASCULAR: Moderate calcific atherosclerosis of the carotid siphons. Dural venous sinuses are patent. SKULL: No skull fracture. No significant scalp soft tissue swelling. SINUSES/ORBITS: Small LEFT sphenoid sinus air-fluid level. Mild paranasal sinus mucosal thickening. Small bilateral mastoid effusions. The included ocular globes and orbital contents are non-suspicious. Status post  bilateral ocular lens implants with sub palpebral gas. OTHER: None.  IMPRESSION: 1. Postprocedural intravascular contrast without acute intracranial process ; otherwise negative CT HEAD for age. Electronically Signed   By: Elon Alas M.D.   On: 05/18/2017 19:25   Ct Angio Neck W Or Wo Contrast  Result Date: 05/18/2017 CLINICAL DATA:  83 year old female with right facial droop and loss of speech, symptom onset about 1 hour ago. Noncontrast head CT suspicious for low left MCA large vessel occlusion EXAM: CT ANGIOGRAPHY HEAD AND NECK CT PERFUSION BRAIN TECHNIQUE: Multidetector CT imaging of the head and neck was performed using the standard protocol during bolus administration of intravenous contrast. Multiplanar CT image reconstructions and MIPs were obtained to evaluate the vascular anatomy. Carotid stenosis measurements (when applicable) are obtained utilizing NASCET criteria, using the distal internal carotid diameter as the denominator. Multiphase CT imaging of the brain was performed following IV bolus contrast injection. Subsequent parametric perfusion maps were calculated using RAPID software. CONTRAST:  90 mL Isovue 370 COMPARISON:  Noncontrast head CT 1629 hours today. FINDINGS: CT Brain Perfusion Findings: CBF (<30%) Volume: 0 mL in the left hemisphere; erroneous appearing 7 mL core infarct detected in the right hemisphere near the vertex. Perfusion (Tmax>6.0s) volume: 139 mL, the majority of which is in the left hemisphere Mismatch Volume:  Significant, at least 100 mL visually. Infarction Location:No core infarct detected in the left hemisphere. CTA NECK Skeleton: Advanced degenerative changes in the cervical spine. No acute osseous abnormality identified. Upper chest: Negative lung apices. No superior mediastinal lymphadenopathy. Other neck: Negative aside from a partially retropharyngeal course of both carotid arteries. No cervical lymphadenopathy. Aortic arch: 3 vessel arch configuration.  Moderate soft and calcified arch atherosclerosis. Right carotid system: Negative to the bifurcation. Mild for age bifurcation soft and calcified plaque. Tortuous cervical right ICA with a partially retropharyngeal course, but no atherosclerotic stenosis. Left carotid system: Negative left CCA origin. Minimal plaque at the left carotid bifurcation. Partially retropharyngeal course and tortuosity of the left ICA without stenosis. Vertebral arteries: No proximal right subclavian artery stenosis. Normal right vertebral artery origin. Tortuous but otherwise negative right vertebral artery to the skullbase. No proximal subclavian artery stenosis despite soft plaque. Normal left vertebral artery origin. Tortuous but otherwise negative left vertebral artery to the skullbase. CTA HEAD Posterior circulation: Mild distal vertebral artery irregularity. No distal vertebral stenosis. Patent PICA origins and vertebrobasilar junction. Patent basilar artery without stenosis. Patent SCA origins. Normal left PCA origin. Fetal type right PCA origin. The left Posterior communicating artery is diminutive or absent. Bilateral PCA branches are within normal limits. Anterior circulation: Both ICA siphons are patent. Tortuosity and mild to moderate calcified plaque, greater on the left. Perhaps moderate but no high-grade left siphon stenosis. Normal right posterior communicating artery origin. Patent carotid termini. Normal MCA and ACA origins. Anterior communicating artery and bilateral ACA branches are within normal limits. Right MCA M1 segment, bifurcation, and right MCA branches are within normal limits. Left MCA M1 segment is occluded 12 mm beyond its origin. The left MCA bifurcation is occluded but there is left M2 and M3 collateral flow (series 11, image 19). Venous sinuses: Patent. Anatomic variants: Fetal right PCA origin. Review of the MIP images confirms the above findings IMPRESSION: 1. Positive for emergent large vessel  occlusion at the left MCA M1 segment. Favorable perfusion findings for Interventional revascularization: left hemisphere penumbra but no core infarct detected by CT perfusion. This was discussed by telephone with Dr. Erlinda Hong On 05/18/2017 at 1643 hours. 2. Bilateral ICA siphon calcified plaque is greater  on the left but without high-grade stenosis. Tortuous cervical carotid arteries but mild for age carotid bifurcation atherosclerosis without stenosis. 3. Negative posterior circulation. Electronically Signed   By: Genevie Ann M.D.   On: 05/18/2017 16:59   Mr Brain Wo Contrast  Result Date: 05/19/2017 CLINICAL DATA:  Stroke. Status post revascularization of occluded left MCA. EXAM: MRI HEAD WITHOUT CONTRAST MRA HEAD WITHOUT CONTRAST TECHNIQUE: Multiplanar, multiecho pulse sequences of the brain and surrounding structures were obtained without intravenous contrast. Angiographic images of the head were obtained using MRA technique without contrast. COMPARISON:  Head CT and CTA 05/18/2017 FINDINGS: MRI HEAD FINDINGS Brain: There is a punctate acute cortical infarct at the medial left temporal occipital junction. There is subtly asymmetric increased trace diffusion signal along the left insula without convincing restricted diffusion. No acute large territory infarct is present. There is no evidence of intracranial hemorrhage, mass, midline shift, or extra-axial fluid collection. Scattered cerebral white matter T2 hyperintensities are nonspecific but compatible with minimal chronic small vessel ischemic disease. There is mild cerebral atrophy. Vascular: Major intracranial vascular flow voids are preserved. Skull and upper cervical spine: Unremarkable bone marrow signal. Sinuses/Orbits: Bilateral cataract extraction. Small volume fluid in the left sphenoid sinus. Small mastoid effusions. Other: None. MRA HEAD FINDINGS The visualized distal vertebral arteries are widely patent to the basilar and codominant. Patent PICA and SCA  origins are visualized bilaterally. The basilar artery is widely patent. There is a fetal origin of the right PCA. The left posterior communicating artery is diminutive or absent. PCAs are patent without evidence of significant stenosis. The internal carotid arteries are patent from skullbase to carotid termini the cavernous segments are tortuous with mild to possibly moderate stenosis bilaterally. There has been interval left MCA revascularization. The MCAs and ACAs are patent bilaterally without evidence of proximal branch occlusion or significant proximal stenosis. No intracranial aneurysm is identified. IMPRESSION: 1. Punctate left temporal-occipital infarct. 2. Artifact versus minimal infarction along the left insula. 3. Minimal chronic small vessel ischemic disease. 4. Patent left MCA following revascularization without significant stenosis. 5. Bilateral ICA atherosclerosis with mild-to-moderate narrowing. Electronically Signed   By: Logan Bores M.D.   On: 05/19/2017 16:34   Ct Cerebral Perfusion W Contrast  Result Date: 05/18/2017 CLINICAL DATA:  83 year old female with right facial droop and loss of speech, symptom onset about 1 hour ago. Noncontrast head CT suspicious for low left MCA large vessel occlusion EXAM: CT ANGIOGRAPHY HEAD AND NECK CT PERFUSION BRAIN TECHNIQUE: Multidetector CT imaging of the head and neck was performed using the standard protocol during bolus administration of intravenous contrast. Multiplanar CT image reconstructions and MIPs were obtained to evaluate the vascular anatomy. Carotid stenosis measurements (when applicable) are obtained utilizing NASCET criteria, using the distal internal carotid diameter as the denominator. Multiphase CT imaging of the brain was performed following IV bolus contrast injection. Subsequent parametric perfusion maps were calculated using RAPID software. CONTRAST:  90 mL Isovue 370 COMPARISON:  Noncontrast head CT 1629 hours today. FINDINGS: CT  Brain Perfusion Findings: CBF (<30%) Volume: 0 mL in the left hemisphere; erroneous appearing 7 mL core infarct detected in the right hemisphere near the vertex. Perfusion (Tmax>6.0s) volume: 139 mL, the majority of which is in the left hemisphere Mismatch Volume:  Significant, at least 100 mL visually. Infarction Location:No core infarct detected in the left hemisphere. CTA NECK Skeleton: Advanced degenerative changes in the cervical spine. No acute osseous abnormality identified. Upper chest: Negative lung apices. No superior mediastinal lymphadenopathy.  Other neck: Negative aside from a partially retropharyngeal course of both carotid arteries. No cervical lymphadenopathy. Aortic arch: 3 vessel arch configuration. Moderate soft and calcified arch atherosclerosis. Right carotid system: Negative to the bifurcation. Mild for age bifurcation soft and calcified plaque. Tortuous cervical right ICA with a partially retropharyngeal course, but no atherosclerotic stenosis. Left carotid system: Negative left CCA origin. Minimal plaque at the left carotid bifurcation. Partially retropharyngeal course and tortuosity of the left ICA without stenosis. Vertebral arteries: No proximal right subclavian artery stenosis. Normal right vertebral artery origin. Tortuous but otherwise negative right vertebral artery to the skullbase. No proximal subclavian artery stenosis despite soft plaque. Normal left vertebral artery origin. Tortuous but otherwise negative left vertebral artery to the skullbase. CTA HEAD Posterior circulation: Mild distal vertebral artery irregularity. No distal vertebral stenosis. Patent PICA origins and vertebrobasilar junction. Patent basilar artery without stenosis. Patent SCA origins. Normal left PCA origin. Fetal type right PCA origin. The left Posterior communicating artery is diminutive or absent. Bilateral PCA branches are within normal limits. Anterior circulation: Both ICA siphons are patent. Tortuosity  and mild to moderate calcified plaque, greater on the left. Perhaps moderate but no high-grade left siphon stenosis. Normal right posterior communicating artery origin. Patent carotid termini. Normal MCA and ACA origins. Anterior communicating artery and bilateral ACA branches are within normal limits. Right MCA M1 segment, bifurcation, and right MCA branches are within normal limits. Left MCA M1 segment is occluded 12 mm beyond its origin. The left MCA bifurcation is occluded but there is left M2 and M3 collateral flow (series 11, image 19). Venous sinuses: Patent. Anatomic variants: Fetal right PCA origin. Review of the MIP images confirms the above findings IMPRESSION: 1. Positive for emergent large vessel occlusion at the left MCA M1 segment. Favorable perfusion findings for Interventional revascularization: left hemisphere penumbra but no core infarct detected by CT perfusion. This was discussed by telephone with Dr. Erlinda Hong On 05/18/2017 at 1643 hours. 2. Bilateral ICA siphon calcified plaque is greater on the left but without high-grade stenosis. Tortuous cervical carotid arteries but mild for age carotid bifurcation atherosclerosis without stenosis. 3. Negative posterior circulation. Electronically Signed   By: Genevie Ann M.D.   On: 05/18/2017 16:59   Dg Chest Port 1 View  Result Date: 05/18/2017 CLINICAL DATA:  ETT placement EXAM: PORTABLE CHEST 1 VIEW COMPARISON:  None. FINDINGS: Endotracheal tube tip is 4.4 cm superior to the carina. Small left-sided pleural effusion with left basilar atelectasis or infiltrate. Mild cardiomegaly with central vascular congestion. Atherosclerotic calcifications of the aorta. No pneumothorax. IMPRESSION: 1. Endotracheal tube tip is 4.4 cm superior to the carina 2. Small left-sided pleural effusion with left basilar atelectasis or infiltrate 3. Mild cardiomegaly with central vascular congestion Electronically Signed   By: Donavan Foil M.D.   On: 05/18/2017 21:54   Mr Jodene Nam Head  Wo Contrast  Result Date: 05/19/2017 CLINICAL DATA:  Stroke. Status post revascularization of occluded left MCA. EXAM: MRI HEAD WITHOUT CONTRAST MRA HEAD WITHOUT CONTRAST TECHNIQUE: Multiplanar, multiecho pulse sequences of the brain and surrounding structures were obtained without intravenous contrast. Angiographic images of the head were obtained using MRA technique without contrast. COMPARISON:  Head CT and CTA 05/18/2017 FINDINGS: MRI HEAD FINDINGS Brain: There is a punctate acute cortical infarct at the medial left temporal occipital junction. There is subtly asymmetric increased trace diffusion signal along the left insula without convincing restricted diffusion. No acute large territory infarct is present. There is no evidence of intracranial hemorrhage,  mass, midline shift, or extra-axial fluid collection. Scattered cerebral white matter T2 hyperintensities are nonspecific but compatible with minimal chronic small vessel ischemic disease. There is mild cerebral atrophy. Vascular: Major intracranial vascular flow voids are preserved. Skull and upper cervical spine: Unremarkable bone marrow signal. Sinuses/Orbits: Bilateral cataract extraction. Small volume fluid in the left sphenoid sinus. Small mastoid effusions. Other: None. MRA HEAD FINDINGS The visualized distal vertebral arteries are widely patent to the basilar and codominant. Patent PICA and SCA origins are visualized bilaterally. The basilar artery is widely patent. There is a fetal origin of the right PCA. The left posterior communicating artery is diminutive or absent. PCAs are patent without evidence of significant stenosis. The internal carotid arteries are patent from skullbase to carotid termini the cavernous segments are tortuous with mild to possibly moderate stenosis bilaterally. There has been interval left MCA revascularization. The MCAs and ACAs are patent bilaterally without evidence of proximal branch occlusion or significant proximal  stenosis. No intracranial aneurysm is identified. IMPRESSION: 1. Punctate left temporal-occipital infarct. 2. Artifact versus minimal infarction along the left insula. 3. Minimal chronic small vessel ischemic disease. 4. Patent left MCA following revascularization without significant stenosis. 5. Bilateral ICA atherosclerosis with mild-to-moderate narrowing. Electronically Signed   By: Logan Bores M.D.   On: 05/19/2017 16:34   Ct Head Code Stroke Wo Contrast`  Result Date: 05/18/2017 CLINICAL DATA:  Code stroke.  Right facial weakness.  Absent speech. EXAM: CT HEAD WITHOUT CONTRAST TECHNIQUE: Contiguous axial images were obtained from the base of the skull through the vertex without intravenous contrast. COMPARISON:  None. FINDINGS: Brain: No evidence of acute infarction, hemorrhage, hydrocephalus, extra-axial collection or mass lesion/mass effect. Vascular: Dense left distal M1 segment, especially on reformats. Skull: No acute or aggressive finding. Bulky right cervical facet arthropathy at C2-3 Sinuses/Orbits: No acute finding Other: Page to Dr Erlinda Hong placed 05/18/2017 at 4:30 pm. CT perfusion in progress. ASPECTS [facility] Stroke Program Early CT Score) - Ganglionic level infarction (caudate, lentiform nuclei, internal capsule, insula, M1-M3 cortex): 7 - Supraganglionic infarction (M4-M6 cortex): 3 Total score (0-10 with 10 being normal): 10 IMPRESSION: Evidence of distal left M1 thrombus. No hemorrhage or visible infarct. Aspects is 10. Electronically Signed   By: Monte Fantasia M.D.   On: 05/18/2017 16:33   IMAGING/STUDIES: CT HEAD CODE STROKE 9/28:  Evidence of distal left M1 thrombus. No hemorrhage or visible infarct. Aspects is 10. CTA HEAD/NECK 9/28: IMPRESSION: 1. Positive for emergent large vessel occlusion at the left MCA M1 segment. Favorable perfusion findings for Interventional revascularization: left hemisphere penumbra but no core infarct detected by CT perfusion. This was discussed by  telephone with Dr. Erlinda Hong On 05/18/2017 at 1643 hours. 2. Bilateral ICA siphon calcified plaque is greater on the left but without high-grade stenosis. Tortuous cervical carotid arteries but mild for age carotid bifurcation atherosclerosis without stenosis. 3. Negative posterior circulation. CT HEAD W/O 9/28:  Postprocedural intravascular contrast without acute intracranial process ; otherwise negative CT HEAD for age. PORT CXR 9/28:  Previously reviewed by me. Slight rotation of the films and left. Silhouetting left hemidiaphragm suggestive of atelectasis versus effusion versus opacity. Endotracheal tube in good position. MRI/MRA 9/29: IMPRESSION: 1. Punctate left temporal-occipital infarct. 2. Artifact versus minimal infarction along the left insula. 3. Minimal chronic small vessel ischemic disease. 4. Patent left MCA following revascularization without significant stenosis. 5. Bilateral ICA atherosclerosis with mild-to-moderate narrowing. TTE 9/29 >>>  MICROBIOLOGY: MRSA PCR 9/28:  Positive   ANTIBIOTICS: None.  LINES/TUBES:  OETT 9/28 - 9/29 Foley 9/28 - 9/29 L RAD ART LINE 9/28 - 9/30 PIV  SIGNIFICANT EVENTS: 09/28 - Admit w/ left M1 CVA >> s/p IR embolectomy  ASSESSMENT / PLAN:  83 y.o. female with acute ischemic CVA status post embolectomy on 9/28. Successfully extubated yesterday. Known history of underlying atrial fibrillation. Heart rate appears to be under control at this time. Patient is exhibiting excellent neurologic recovery in my opinion.  1. Acute left MCA CVA: Status post embolectomy. Management per primary service and interventional radiology. 2. Essential hypertension: Echocardiogram pending. Monitoring vitals per unit protocol. 3. Atrial fibrillation: Continuous telemetry monitoring. Holding systemic anticoagulation due to risk of bleeding. 4. Hypokalemia: Replaced with KCl 10 mEq IV 4 runs. Repeat electrolyte panel in the morning. 5. History of DVT: Holding  systemic anticoagulation due to bleeding risk.  6. Hypomagnesemia: Resolved.  Prophylaxis: Lovenox subcutaneous daily, SCDs, & Protonix by mouth daily. Diet:  Heart healthy diet. Code Status:  Full Code per previous physician discussions. Disposition:  As per primary service.  Family Update:  Patient updated at the time of my exam.  I have spent a total of 36 minutes of time today caring for the patient, reviewing the patient's electronic medical record, and with more than 50% of that time spent coordinating care with the patient as well as reviewing the continuing plan of care with the patient at bedside.  Remainder of care as per primary service and other consultants. PCCM will sign off at this time. Please let us know if there are any further questions or concerns.   Sonia Baller Ashok Cordia, M.D. [facility] Pager:  [phone] After 3pm or if no response, call [phone] 05/20/2017, 5:34 AM

## 2017-05-20 NOTE — Evaluation (Signed)
Physical Therapy Evaluation Patient Details Name: Amber Barnett MRN: [id number] DOB: July 22, 1934 Today's Date: 05/20/2017   History of Present Illness  83 y.o.female with PMHx Left Breast CA s/p surgery, HTN, Afib, DVT on anticoagulation with eliquis presents after acute onset of aphasia and right sided weakness. CTH showed left M1 CVA.Required intubation s/p IR mechanical thrombectomy for revascularization 9/28-9/29/18.    Clinical Impression  Pt admitted with above diagnosis. Full gait and balance assessment could not be completed  due to HR 144 and drop in BP with light headedness with pivot to chair. Pt currently with functional limitations due to the deficits listed below (see PT Problem List). Anticipate excellent progress with her mobility once BP and HR stabilized.  Pt will benefit from skilled PT to increase their independence and safety with mobility to allow discharge to the venue listed below.       Follow Up Recommendations Outpatient PT;Supervision/Assistance - 24 hour    Equipment Recommendations  None recommended by PT    Recommendations for Other Services OT consult     Precautions / Restrictions Precautions Precautions: Fall;Other (comment) Precaution Comments: BP dropped with standing, HR up to 144 Restrictions Weight Bearing Restrictions: No      Mobility  Bed Mobility Overal bed mobility: Needs Assistance Bed Mobility: Supine to Sit     Supine to sit: Min guard     General bed mobility comments: ICU/air mattress bed; close guarding for safety and pt with light headedness upon sitting (<30 seconds)  Transfers Overall transfer level: Needs assistance Equipment used: None Transfers: Stand Pivot Transfers   Stand pivot transfers: Min guard       General transfer comment: again became light headed upon standing and then HR up to 137; pivoted to chair with HR up to 144 and BP cuff reapplied and taken with BP decreased 175/93 to 168/86 (there was a  delay in getting BP after sit to stand to sit)  Ambulation/Gait Ambulation/Gait assistance:  (TBA as appropriate)              Stairs            Wheelchair Mobility    Modified Rankin (Stroke Patients Only) Modified Rankin (Stroke Patients Only) Pre-Morbid Rankin Score: No symptoms Modified Rankin: Moderately severe disability     Balance Overall balance assessment: Needs assistance (unable to fully assess due to medical issues) Sitting-balance support: No upper extremity supported;Feet unsupported Sitting balance-Leahy Scale: Good     Standing balance support: No upper extremity supported Standing balance-Leahy Scale: Fair Standing balance comment: became light-headed                             Pertinent Vitals/Pain Pain Assessment: No/denies pain    Home Living Family/patient expects to be discharged to:: Private residence Living Arrangements: Non-relatives/Friends Available Help at Discharge: Family;Friend(s) Type of Home: House Home Access: Stairs to enter Entrance Stairs-Rails: Right Entrance Stairs-Number of Steps: 2 Home Layout: One level Home Equipment: Cane - single point;Walker - 2 wheels (equipment was her husbands (deceased))      Prior Function Level of Independence: Independent         Comments: still drives     Hand Dominance   Dominant Hand: Right    Extremity/Trunk Assessment   Upper Extremity Assessment Upper Extremity Assessment: Defer to OT evaluation    Lower Extremity Assessment Lower Extremity Assessment: Generalized weakness (bil knee ext 3+, ankle DF 4)  Communication   Communication: No difficulties  Cognition Arousal/Alertness: Awake/alert Behavior During Therapy: WFL for tasks assessed/performed Overall Cognitive Status: Within Functional Limits for tasks assessed                                        General Comments      Exercises     Assessment/Plan    PT  Assessment Patient needs continued PT services  PT Problem List Decreased strength;Decreased activity tolerance;Decreased balance;Decreased mobility;Decreased knowledge of use of DME;Cardiopulmonary status limiting activity       PT Treatment Interventions DME instruction;Gait training;Stair training;Functional mobility training;Therapeutic activities;Therapeutic exercise;Balance training;Neuromuscular re-education;Patient/family education    PT Goals (Current goals can be found in the Care Plan section)  Acute Rehab PT Goals Patient Stated Goal: return to her prior level of activity PT Goal Formulation: With patient Time For Goal Achievement: 06/03/17 Potential to Achieve Goals: Good    Frequency Min 4X/week   Barriers to discharge        Co-evaluation               AM-PAC PT "6 Clicks" Daily Activity  Outcome Measure Difficulty turning over in bed (including adjusting bedclothes, sheets and blankets)?: A Little Difficulty moving from lying on back to sitting on the side of the bed? : A Little Difficulty sitting down on and standing up from a chair with arms (e.g., wheelchair, bedside commode, etc,.)?: A Little Help needed moving to and from a bed to chair (including a wheelchair)?: A Little Help needed walking in hospital room?: Total Help needed climbing 3-5 steps with a railing? : Total 6 Click Score: 14    End of Session Equipment Utilized During Treatment: Gait belt Activity Tolerance: Treatment limited secondary to medical complications (Comment) Patient left: in chair;with call bell/phone within reach;with SCD's reapplied;with nursing/sitter in room Nurse Communication: Mobility status (likely orthostatic; HR 144) PT Visit Diagnosis: Unsteadiness on feet (R26.81);Muscle weakness (generalized) (M62.81);Other abnormalities of gait and mobility (R26.89)    Time: 6203-5597 PT Time Calculation (min) (ACUTE ONLY): 36 min   Charges:   PT Evaluation $PT Eval Low  Complexity: 1 Low     PT G Codes:        Barry Brunner, PT Pager [phone]  Rexanne Mano 05/20/2017, 12:38 PM

## 2017-05-20 NOTE — Progress Notes (Signed)
[facility] ADULT ICU REPLACEMENT PROTOCOL FOR AM LAB REPLACEMENT ONLY  The patient does apply for the [facility] Adult ICU Electrolyte Replacment Protocol based on the criteria listed below:   1. Is GFR >/= 40 ml/min? Yes.    Patient's GFR today is >60 2. Is urine output >/= 0.5 ml/kg/hr for the last 6 hours? Yes.   Patient's UOP is 1.6 ml/kg/hr 3. Is BUN < 60 mg/dL? Yes.    Patient's BUN today is <5 4. Abnormal electrolyte(s):k 3.0 5. Ordered repletion with: protocol 6. If a panic level lab has been reported, has the CCM MD in charge been notified? No..   Physician:    Ronda Fairly A 05/20/2017 4:03 AM

## 2017-05-21 DIAGNOSIS — Z7901 Long term (current) use of anticoagulants: Secondary | ICD-10-CM

## 2017-05-21 LAB — BASIC METABOLIC PANEL
Anion gap: 8 (ref 5–15)
BUN: 11 mg/dL (ref 6–20)
CALCIUM: 8.6 mg/dL — AB (ref 8.9–10.3)
CO2: 22 mmol/L (ref 22–32)
Chloride: 104 mmol/L (ref 101–111)
Creatinine, Ser: 1.16 mg/dL — ABNORMAL HIGH (ref 0.44–1.00)
GFR calc Af Amer: 50 mL/min — ABNORMAL LOW (ref >60)
GFR, EST NON AFRICAN AMERICAN: 43 mL/min — AB (ref >60)
GLUCOSE: 86 mg/dL (ref 65–99)
Potassium: 3.7 mmol/L (ref 3.5–5.1)
Sodium: 134 mmol/L — ABNORMAL LOW (ref 135–145)

## 2017-05-21 MED ORDER — RIVAROXABAN 20 MG PO TABS: 20.0000 mg | ORAL_TABLET | Freq: Every day | ORAL | 2 refills | Status: DC

## 2017-05-21 NOTE — Progress Notes (Signed)
OT Note - Addendum    05/21/17 1754  OT Visit Information  Last OT Received On 05/21/17  OT Time Calculation  OT Start Time (ACUTE ONLY) 1610  OT Stop Time (ACUTE ONLY) 1632  OT Time Calculation (min) 22 min  OT General Charges  $OT Visit 1 Visit  OT Treatments  $Self Care/Home Management  8-22 mins  [facility], OT/L  [phone] 05/21/2017

## 2017-05-21 NOTE — Care Management Note (Addendum)
Case Management Note  Patient Details  Name: Amber Barnett MRN: [id number] Date of Birth: [birth year]  Subjective/Objective:  Pt admitted with CVA. She is from home with friends. She has a niece that is active in assisting her at home.                   Action/Plan: CM consulted for outpatient therapy and Xarelto. CM met with the patient and her niece and inquired about outpatient therapy. They would like pt to go to [facility]. Orders in EPIC and information on the AVS.  Dr Leonie Man plans to change BID Eliquis to Xarelto daily. CM did a benefits check that reviled a cost of $ 8.35/ month through [facility] or mail order. Pt and niece made aware. CM provided her a 30 day free card for the Xarelto. No PCP listed. Patients niece states she goes to Dr Franchot Mimes at the [facility].  CM also provided the niece with information on home emergency system that also has a medication dispensing system to assist patient in taking her medications properly. Niece to provide transportation home today.    Expected Discharge Date:                  Expected Discharge Plan:  OP Rehab  In-House Referral:     Discharge planning Services  CM Consult, Medication Assistance  Post Acute Care Choice:    Choice offered to:     DME Arranged:    DME Agency:     HH Arranged:    HH Agency:     Status of Service:  Completed, signed off  If discussed at [employer] of Stay Meetings, dates discussed:    Additional Comments:  Pollie Friar, RN 05/21/2017, 2:17 PM

## 2017-05-21 NOTE — Discharge Summary (Signed)
Stroke Discharge Summary  Patient ID: Amber Barnett   MRN: [id number]      DOB: Apr 26, 1934  Date of Admission: 05/18/2017 Date of Discharge: 05/21/2017  Attending Physician:  Garvin Fila, MD, Stroke MD Consultant(s):    pulmonary/intensive care and neurovascular interventional  Patient's PCP:  System, Pcp Not In  DISCHARGE DIAGNOSIS: Principal Problem:  Embolic left MCA branch infarct s/p   mechanical embolectomy with TICI 3 reperfusion with excellent clinical outcome Active Problems:   Essential hypertension   Permanent atrial fibrillation ([place])   Current use of long term anticoagulation   Past Medical History:  Diagnosis Date  . Atrial fibrillation ([place])   . DVT (deep venous thrombosis) ([place])   . History of left breast cancer   . Hypertension    Past Surgical History:  Procedure Laterality Date  . RADIOLOGY WITH ANESTHESIA N/A 05/18/2017   Procedure: RADIOLOGY WITH ANESTHESIA;  Surgeon: Luanne Bras, MD;  Location: [place];  Service: Radiology;  Laterality: N/A;    Allergies as of 05/21/2017      Reactions   Fish-derived Products Swelling   Facial swelling then tongue swelling   Lactuca Virosa Diarrhea   Milk-related Compounds Diarrhea   WHOLE MILK   Other Swelling   Allergy to all nuts - facial swelling, then tongue swelling   Amoxicillin Nausea And Vomiting      Medication List    STOP taking these medications   ELIQUIS 2.5 MG Tabs tablet Generic drug:  apixaban     TAKE these medications   amLODipine-valsartan 5-320 MG tablet Commonly known as:  EXFORGE Take 1 tablet by mouth daily.   diltiazem 120 MG 24 hr capsule Commonly known as:  TIAZAC Take 120 mg by mouth daily.   diphenhydrAMINE 25 mg capsule Commonly known as:  BENADRYL Take 25 mg by mouth daily as needed for itching (allergic reaction).   latanoprost 0.005 % ophthalmic solution Commonly known as:  XALATAN Place 1 drop into both eyes at bedtime.   LORazepam 1 MG  tablet Commonly known as:  ATIVAN Take 1 mg by mouth every 8 (eight) hours as needed for anxiety.   Magnesium 250 MG Tabs Take 250 mg by mouth daily.   pantoprazole 40 MG tablet Commonly known as:  PROTONIX Take 40 mg by mouth daily.   Potassium 99 MG Tabs Take 99 mg by mouth daily.   rivaroxaban 20 MG Tabs tablet Commonly known as:  XARELTO Take 1 tablet (20 mg total) by mouth daily with supper.   Vitamin D3 2000 units capsule Take 2,000 Units by mouth daily.       LABORATORY STUDIES CBC    Component Value Date/Time   WBC 8.8 05/20/2017 0245   RBC 4.40 05/20/2017 0245   HGB 10.6 (L) 05/20/2017 0245   HCT 33.3 (L) 05/20/2017 0245   PLT 212 05/20/2017 0245   MCV 75.7 (L) 05/20/2017 0245   MCH 24.1 (L) 05/20/2017 0245   MCHC 31.8 05/20/2017 0245   RDW 17.7 (H) 05/20/2017 0245   LYMPHSABS 2.3 05/20/2017 0245   MONOABS 0.8 05/20/2017 0245   EOSABS 0.2 05/20/2017 0245   BASOSABS 0.0 05/20/2017 0245   CMP    Component Value Date/Time   NA 134 (L) 05/21/2017 0524   K 3.7 05/21/2017 0524   CL 104 05/21/2017 0524   CO2 22 05/21/2017 0524   GLUCOSE 86 05/21/2017 0524   BUN 11 05/21/2017 0524   CREATININE 1.16 (H) 05/21/2017 0524  CALCIUM 8.6 (L) 05/21/2017 0524   PROT 7.6 05/18/2017 1615   ALBUMIN 3.2 (L) 05/20/2017 0245   AST 29 05/18/2017 1615   ALT 24 05/18/2017 1615   ALKPHOS 77 05/18/2017 1615   BILITOT 1.1 05/18/2017 1615   GFRNONAA 43 (L) 05/21/2017 0524   GFRAA 50 (L) 05/21/2017 0524   COAGS Lab Results  Component Value Date   INR 1.36 05/18/2017   Lipid Panel    Component Value Date/Time   CHOL 107 05/19/2017 0601   TRIG 144 05/19/2017 0601   HDL 32 (L) 05/19/2017 0601   CHOLHDL 3.3 05/19/2017 0601   VLDL 29 05/19/2017 0601   LDLCALC 46 05/19/2017 0601   HgbA1C  Lab Results  Component Value Date   HGBA1C 5.8 (H) 05/19/2017   Urinalysis    Component Value Date/Time   COLORURINE YELLOW 05/18/2017 2009   APPEARANCEUR CLEAR 05/18/2017  2009   LABSPEC 1.035 (H) 05/18/2017 2009   PHURINE 6.0 05/18/2017 2009   GLUCOSEU NEGATIVE 05/18/2017 2009   HGBUR NEGATIVE 05/18/2017 2009   BILIRUBINUR NEGATIVE 05/18/2017 2009   [place] NEGATIVE 05/18/2017 2009   PROTEINUR 100 (A) 05/18/2017 2009   UROBILINOGEN >8.0 (H) 07/19/2014 0929   NITRITE NEGATIVE 05/18/2017 2009   LEUKOCYTESUR NEGATIVE 05/18/2017 2009   Urine Drug Screen     Component Value Date/Time   LABOPIA NONE DETECTED 05/18/2017 2009   COCAINSCRNUR NONE DETECTED 05/18/2017 2009   LABBENZ POSITIVE (A) 05/18/2017 2009   AMPHETMU NONE DETECTED 05/18/2017 2009   THCU NONE DETECTED 05/18/2017 2009   LABBARB NONE DETECTED 05/18/2017 2009    Alcohol Level    Component Value Date/Time   ETH <10 05/18/2017 1615     SIGNIFICANT DIAGNOSTIC STUDIES 05/18/17 Ct Head Code Stroke Wo Contrast IMPRESSION:  Evidence of distal left M1 thrombus. No hemorrhage or visible infarct. Aspects is 10.  Ct Angio Head and Neck W Or Wo Contrast  Ct Cerebral Perfusion W Contrast IMPRESSION:  1. Positive for emergent large vessel occlusion at the left MCA M1 segment. Favorable perfusion findings for Interventional revascularization: left hemisphere penumbra but no core infarct detected by CT perfusion.  2. Bilateral ICA siphon calcified plaque is greater on the left but without high-grade stenosis. Tortuous cervical carotid arteries but mild for age carotid bifurcation atherosclerosis without stenosis.  3. Negative posterior circulation.   Ct Head Wo Contrast IMPRESSION:  Postprocedural intravascular contrast without acute intracranial process ; otherwise negative CT HEAD for age.   Dg Chest Port 1 View IMPRESSION:  1. Endotracheal tube tip is 4.4 cm superior to the carina  2. Small left-sided pleural effusion with left basilar atelectasis or infiltrate  3. Mild cardiomegaly with central vascular congestion    INTERVENTIONAL RADIOLOGY - Dr. Estanislado Pandy S/p COMPLETE  REVASCULARIZATION OF OCCLUDED lt mca m 1 SEG WITH X 1 PASS WITH 4MM X 40 MM SOLITAIRE fr RETRIEVER DEVICE ACHIEVING A TICI 3 REPERFUSION.  05/19/17 MRI/MRA Head IMPRESSION: 1. Punctate left temporal-occipital infarct. 2. Artifact versus minimal infarction along the left insula. 3. Minimal chronic small vessel ischemic disease. 4. Patent left MCA following revascularization without significant stenosis. 5. Bilateral ICA atherosclerosis with mild-to-moderate narrowing.  05/20/17 Transthoracic Echocardiogram IMPRESSION: The patient was in atrial fibrillation. Normal LV size with EF 55-60%. D-shaped interventricular septum suggestive of RV pressure/volume overload. Moderately dilated RV with mildly decreased systolic function. Moderate MR. Moderate to severe TR. Biatrial enlargement. Moderate pulmonary hypertension.    HISTORY OF PRESENT ILLNESS Amber Barnett an 83 y.o.femalewith PMH of  Afib and DVT on eliquis, HTN, left breast cancer s/p surgery presented as code stroke for acute onset of aphasia and right neglect.  Pt was shopping with family at 3:50pm when she develop acute onset speech difficulty and right weakness. EMS called. On arrival, pt had left gaze, right hemiparesis, aphasia. Glucose 93 and BP 158/68. EKG did not show afib rhythm.   Code stroke called. She was sent to CT at [facility]. No bleeding and ASPECT 10. CTA head and neck showed left M1 cut off and CTP large penumbra and almost no infarct core.   On exam, she still has global aphasia, but right arm and leg moving much improved, but still has drift. Still has right neglect, left gaze preference but eye able to cross midline. Tele showed afib. NIHSS = 17. IR notified and pt taken to IR suite.   As per family, pt is compliant with medication although they did not check her pill box everyday. At baseline, she is active and talking and walking.   HOSPITAL COURSE Acute embolic L MCA stroke: Ms. Broussard presented with left  sized gaze deviation, right hemiparesis, and aphasia witness by family. The patient admitted to not being compliant with taking the second dose of eliquis every day and consistently. On presentation head CT indicated distal M1 occlusive thrombus. CTA was performed and subsequent successful revascularization by Dr. Estanislado Pandy. Subsequent MRI head showed punctate infarct without significant residual stenosis. She had no significant resultant deficits and was discharged to home with her niece for supervision and outpatient PT recommended. On review of risk factors she was noted to report missing doses of Eliquis intermittently, including right before this admission. She remained in Afib throughout her evaluation. Eliquis was discontinued and Xarelto prescribed at discharge for hopefully easier treatment adherence. Patient was counseled to be compliant with Xarelto and to take it with the proper meal consistently at the same time every day. She voiced understanding  DISCHARGE EXAM Blood pressure 136/71, pulse 86, temperature 98.3 F (36.8 C), temperature source Oral, resp. rate 20, height 5\' 6"  (1.676 m), weight 159 lb 6.3 oz (72.3 kg), SpO2 100 %. Physical exam: Exam: Gen: Alert, comfortable, sitting in bedside chair                  Eyes: Conjunctivae clear without exudates or hemorrhage Neuro: Detailed Neurologic Exam Speech: No dysarthria, normal speech content Cognition: Nods appropriately to simple questions Cranial Nerves: Extraocular movements are intact. Face appears symmetric. Hearing intact. Motor: No involuntary movements noted, 5/5 strength bilaterally, fine motor skills intact in RUE Sensation: Reports normal sensation in all extremities NIHSS 0.  Discharge Diet Diet - low sodium heart healthy  DISCHARGE PLAN  Disposition:  To home with niece, outpatient PT  Xarelto (rivaroxaban) daily for secondary stroke prevention.  Ongoing risk factor control by Primary Care Physician at  time of discharge  Follow-up with PCP office or Cardiologist as needed  Follow-up with Dr. Antony Contras, Stroke Clinic in 6 weeks, office to schedule an appointment.  32 minutes were spent preparing discharge.  Collier Salina, MD PGY-III Internal Medicine Resident Pager# [phone] 05/21/2017, 4:45 PM  I have personally examined this patient, reviewed notes, independently viewed imaging studies, participated in medical decision making and plan of care.ROS completed by me personally and pertinent positives fully documented  I have made any additions or clarifications directly to the above note. Agree with note above.    Antony Contras, MD Medical Director [facility] Pager:  [phone] 05/22/2017 1:48 PM

## 2017-05-21 NOTE — Progress Notes (Signed)
This RN went over Discharge with Patient and Family. Patient and Family verbalized understanding of discharge. All questions and concerns addressed. Patient taking down in a wheelchair.

## 2017-05-21 NOTE — Progress Notes (Signed)
CM consulted for benefits check for Xarelto:   #  5.  S/W DAEQUITA  @ [facility] # [phone]   1. XARELTO  15 MG BID   COVER-- YES  CO-PAY- $ 8.35  TIER- 3 DRUG  PRIOR APPROVAL- NO   2. XARELTO  20 MG DAILY   COVER- YES  CO-PAY- $ 8.35  TIER- 3 DRUG  PRIOR APPROVAL- NO   PREFERRED PHARMACY : [facility] AND [facility]

## 2017-05-21 NOTE — Progress Notes (Signed)
Physical Therapy Treatment Patient Details Name: Amber Barnett MRN: [id number] DOB: 03-22-1934 Today's Date: 05/21/2017    History of Present Illness 83 y.o.female with PMHx Left Breast CA s/p surgery, HTN, Afib, DVT on anticoagulation with eliquis presents after acute onset of aphasia and right sided weakness. CTH showed left M1 CVA.Required intubation s/p IR mechanical thrombectomy for revascularization 9/28-9/29/18.    PT Comments    Patient is progressing toward mobility goals. Ambulated with supervision for safety and no AD. No c/o dizziness or lightheadedness during activities today. Patient managed stairs with supervision for safety and min use of rail for stability. Will continue to see acutely to address impairments and maximize function for safe d/c to home.    Vitals were monitored and stable throughout session. Nursing obtained vitals upon PT arrival (BP 118/59 HR 95 O2 100% on RA). Vitals after activity (BP 152/72 HR 88 O2 95% on RA)    Follow Up Recommendations  Outpatient PT;Supervision/Assistance - 24 hour     Equipment Recommendations  None recommended by PT    Recommendations for Other Services       Precautions / Restrictions Precautions Precautions: Fall Precaution Comments: Watch HR and BP Restrictions Weight Bearing Restrictions: No    Mobility  Bed Mobility               General bed mobility comments: Pt OOB in recliner upon arrival  Transfers Overall transfer level: Needs assistance Equipment used: None Transfers: Sit to/from Stand Sit to Stand: Supervision         General transfer comment: no c/o dizziness or lightheadness on standing. vitals stable throughout. supervision for safety  Ambulation/Gait Ambulation/Gait assistance: Supervision Ambulation Distance (Feet): 410 Feet Assistive device: None Gait Pattern/deviations: Step-through pattern;Decreased step length - right;Decreased step length - left;Decreased stride  length Gait velocity: decreased   General Gait Details: slow and cautious. 1 instance of instability with horizontal head turns but able to self correct. One instance of "cloudy" vision in L eye when returning to room, but no c/o of dizziness/lightheadedness   Stairs Stairs: Yes   Stair Management: One rail Right;Alternating pattern Number of Stairs: 12 General stair comments: supervision for safety; min use of rail for stability  Wheelchair Mobility    Modified Rankin (Stroke Patients Only) Modified Rankin (Stroke Patients Only) Pre-Morbid Rankin Score: No symptoms Modified Rankin: Moderate disability     Balance Overall balance assessment: Needs assistance Sitting-balance support: No upper extremity supported;Feet supported Sitting balance-Leahy Scale: Good     Standing balance support: During functional activity;No upper extremity supported Standing balance-Leahy Scale: Good               High level balance activites: Head turns;Sudden stops;Turns High Level Balance Comments: 1 LOB noted with horizontal head turns, but was able to self correct            Cognition Arousal/Alertness: Awake/alert Behavior During Therapy: WFL for tasks assessed/performed Overall Cognitive Status: Within Functional Limits for tasks assessed                                        Exercises      General Comments General comments (skin integrity, edema, etc.): Nursing taking vitals upon arrival; HR 95, O2 100% on RA BP 118/59. After activities HR 88 O2 95% RA BP 152/72. Stable throughout activities      Pertinent Vitals/Pain Pain Assessment: Faces Faces  Pain Scale: Hurts a little bit Pain Location: L arm Pain Descriptors / Indicators: Sore Pain Intervention(s): Premedicated before session    Home Living                      Prior Function            PT Goals (current goals can now be found in the care plan section) Acute Rehab PT  Goals Patient Stated Goal: return to her prior level of activity PT Goal Formulation: With patient Time For Goal Achievement: 06/03/17 Potential to Achieve Goals: Good Progress towards PT goals: Progressing toward goals    Frequency    Min 4X/week      PT Plan Current plan remains appropriate    Co-evaluation              AM-PAC PT "6 Clicks" Daily Activity  Outcome Measure  Difficulty turning over in bed (including adjusting bedclothes, sheets and blankets)?: A Little Difficulty moving from lying on back to sitting on the side of the bed? : A Little Difficulty sitting down on and standing up from a chair with arms (e.g., wheelchair, bedside commode, etc,.)?: A Little Help needed moving to and from a bed to chair (including a wheelchair)?: A Little Help needed walking in hospital room?: A Little Help needed climbing 3-5 steps with a railing? : A Little 6 Click Score: 18    End of Session Equipment Utilized During Treatment: Gait belt Activity Tolerance: Patient tolerated treatment well Patient left: in chair;with call bell/phone within reach;with chair alarm set;with family/visitor present Nurse Communication: Mobility status PT Visit Diagnosis: Unsteadiness on feet (R26.81);Muscle weakness (generalized) (M62.81);Other abnormalities of gait and mobility (R26.89)     Time: 1033-1050 PT Time Calculation (min) (ACUTE ONLY): 17 min  Charges:  $Gait Training: 8-22 mins                    G CodesSindy Guadeloupe, SPT [phone] office    Margarita Grizzle 05/21/2017, 11:24 AM

## 2017-05-21 NOTE — Progress Notes (Signed)
Patient ID: Amber Barnett, female   DOB: November 30, 1933, 83 y.o.   MRN: [id number]    Referring Physician(s): Dr. Antony Contras  Supervising Physician: Luanne Bras  Patient Status: [facility] - In-pt  Chief Complaint: CVA  Subjective: Pt doing great.  Walked with PT this am.  Ready to go home.  No troubles eating.  Allergies: Fish-derived products; Lactuca virosa; Milk-related compounds; Other; and Amoxicillin  Medications: Prior to Admission medications   Medication Sig Start Date End Date Taking? Authorizing Provider  amLODipine-valsartan (EXFORGE) 5-320 MG tablet Take 1 tablet by mouth daily. 08/17/16  Yes [provider]  apixaban (ELIQUIS) 2.5 MG TABS tablet Take 2.5 mg by mouth 2 (two) times daily.   Yes [provider]  Cholecalciferol (VITAMIN D3) 2000 units capsule Take 2,000 Units by mouth daily.   Yes [provider]  diltiazem (TIAZAC) 120 MG 24 hr capsule Take 120 mg by mouth daily. 04/26/17  Yes [provider]  diphenhydrAMINE (BENADRYL) 25 mg capsule Take 25 mg by mouth daily as needed for itching (allergic reaction).    Yes [provider]  latanoprost (XALATAN) 0.005 % ophthalmic solution Place 1 drop into both eyes at bedtime. 10/16/16  Yes [provider]  LORazepam (ATIVAN) 1 MG tablet Take 1 mg by mouth every 8 (eight) hours as needed for anxiety.    Yes [provider]  Magnesium 250 MG TABS Take 250 mg by mouth daily.   Yes [provider]  pantoprazole (PROTONIX) 40 MG tablet Take 40 mg by mouth daily. 04/26/17  Yes [provider]  Potassium 99 MG TABS Take 99 mg by mouth daily.   Yes [provider]    Vital Signs: BP (!) 118/59 (BP Location: Right Arm)   Pulse 95   Temp 98.3 F (36.8 C) (Oral)   Resp 20   Ht 5\' 6"  (1.676 m)   Wt 159 lb 6.3 oz (72.3 kg)   SpO2 100%   BMI 25.73 kg/m   Physical Exam: Neuro: grossly intact.  Strength is equal in upper and lower  extremities Skin: R CFA stick site is c/d/i  Imaging: Ct Angio Head W Or Wo Contrast  Result Date: 05/18/2017 CLINICAL DATA:  83 year old female with right facial droop and loss of speech, symptom onset about 1 hour ago. Noncontrast head CT suspicious for low left MCA large vessel occlusion EXAM: CT ANGIOGRAPHY HEAD AND NECK CT PERFUSION BRAIN TECHNIQUE: Multidetector CT imaging of the head and neck was performed using the standard protocol during bolus administration of intravenous contrast. Multiplanar CT image reconstructions and MIPs were obtained to evaluate the vascular anatomy. Carotid stenosis measurements (when applicable) are obtained utilizing NASCET criteria, using the distal internal carotid diameter as the denominator. Multiphase CT imaging of the brain was performed following IV bolus contrast injection. Subsequent parametric perfusion maps were calculated using RAPID software. CONTRAST:  90 mL Isovue 370 COMPARISON:  Noncontrast head CT 1629 hours today. FINDINGS: CT Brain Perfusion Findings: CBF (<30%) Volume: 0 mL in the left hemisphere; erroneous appearing 7 mL core infarct detected in the right hemisphere near the vertex. Perfusion (Tmax>6.0s) volume: 139 mL, the majority of which is in the left hemisphere Mismatch Volume:  Significant, at least 100 mL visually. Infarction Location:No core infarct detected in the left hemisphere. CTA NECK Skeleton: Advanced degenerative changes in the cervical spine. No acute osseous abnormality identified. Upper chest: Negative lung apices. No superior mediastinal lymphadenopathy. Other neck: Negative aside from a partially retropharyngeal course  of both carotid arteries. No cervical lymphadenopathy. Aortic arch: 3 vessel arch configuration. Moderate soft and calcified arch atherosclerosis. Right carotid system: Negative to the bifurcation. Mild for age bifurcation soft and calcified plaque. Tortuous cervical right ICA with a partially retropharyngeal  course, but no atherosclerotic stenosis. Left carotid system: Negative left CCA origin. Minimal plaque at the left carotid bifurcation. Partially retropharyngeal course and tortuosity of the left ICA without stenosis. Vertebral arteries: No proximal right subclavian artery stenosis. Normal right vertebral artery origin. Tortuous but otherwise negative right vertebral artery to the skullbase. No proximal subclavian artery stenosis despite soft plaque. Normal left vertebral artery origin. Tortuous but otherwise negative left vertebral artery to the skullbase. CTA HEAD Posterior circulation: Mild distal vertebral artery irregularity. No distal vertebral stenosis. Patent PICA origins and vertebrobasilar junction. Patent basilar artery without stenosis. Patent SCA origins. Normal left PCA origin. Fetal type right PCA origin. The left Posterior communicating artery is diminutive or absent. Bilateral PCA branches are within normal limits. Anterior circulation: Both ICA siphons are patent. Tortuosity and mild to moderate calcified plaque, greater on the left. Perhaps moderate but no high-grade left siphon stenosis. Normal right posterior communicating artery origin. Patent carotid termini. Normal MCA and ACA origins. Anterior communicating artery and bilateral ACA branches are within normal limits. Right MCA M1 segment, bifurcation, and right MCA branches are within normal limits. Left MCA M1 segment is occluded 12 mm beyond its origin. The left MCA bifurcation is occluded but there is left M2 and M3 collateral flow (series 11, image 19). Venous sinuses: Patent. Anatomic variants: Fetal right PCA origin. Review of the MIP images confirms the above findings IMPRESSION: 1. Positive for emergent large vessel occlusion at the left MCA M1 segment. Favorable perfusion findings for Interventional revascularization: left hemisphere penumbra but no core infarct detected by CT perfusion. This was discussed by telephone with Dr. Erlinda Hong On  05/18/2017 at 1643 hours. 2. Bilateral ICA siphon calcified plaque is greater on the left but without high-grade stenosis. Tortuous cervical carotid arteries but mild for age carotid bifurcation atherosclerosis without stenosis. 3. Negative posterior circulation. Electronically Signed   By: Genevie Ann M.D.   On: 05/18/2017 16:59   Ct Head Wo Contrast  Result Date: 05/18/2017 CLINICAL DATA:  Status post complete revascularization of occluded LEFT middle cerebral artery. EXAM: CT HEAD WITHOUT CONTRAST TECHNIQUE: Contiguous axial images were obtained from the base of the skull through the vertex without intravenous contrast. COMPARISON:  CT HEAD September 28th 2018 at 1629 hours FINDINGS: BRAIN: No intraparenchymal hemorrhage, mass effect nor midline shift. The ventricles and sulci are normal for age. Patchy supratentorial white matter hypodensities within normal range for patient's age, though non-specific are most compatible with chronic small vessel ischemic disease. No acute large vascular territory infarcts. No contrast staining or abnormal intracranial enhancement. No abnormal extra-axial fluid collections. Basal cisterns are patent. VASCULAR: Moderate calcific atherosclerosis of the carotid siphons. Dural venous sinuses are patent. SKULL: No skull fracture. No significant scalp soft tissue swelling. SINUSES/ORBITS: Small LEFT sphenoid sinus air-fluid level. Mild paranasal sinus mucosal thickening. Small bilateral mastoid effusions. The included ocular globes and orbital contents are non-suspicious. Status post bilateral ocular lens implants with sub palpebral gas. OTHER: None. IMPRESSION: 1. Postprocedural intravascular contrast without acute intracranial process ; otherwise negative CT HEAD for age. Electronically Signed   By: Elon Alas M.D.   On: 05/18/2017 19:25   Ct Angio Neck W Or Wo Contrast  Result Date: 05/18/2017 CLINICAL DATA:  83 year old female  with right facial droop and loss of speech,  symptom onset about 1 hour ago. Noncontrast head CT suspicious for low left MCA large vessel occlusion EXAM: CT ANGIOGRAPHY HEAD AND NECK CT PERFUSION BRAIN TECHNIQUE: Multidetector CT imaging of the head and neck was performed using the standard protocol during bolus administration of intravenous contrast. Multiplanar CT image reconstructions and MIPs were obtained to evaluate the vascular anatomy. Carotid stenosis measurements (when applicable) are obtained utilizing NASCET criteria, using the distal internal carotid diameter as the denominator. Multiphase CT imaging of the brain was performed following IV bolus contrast injection. Subsequent parametric perfusion maps were calculated using RAPID software. CONTRAST:  90 mL Isovue 370 COMPARISON:  Noncontrast head CT 1629 hours today. FINDINGS: CT Brain Perfusion Findings: CBF (<30%) Volume: 0 mL in the left hemisphere; erroneous appearing 7 mL core infarct detected in the right hemisphere near the vertex. Perfusion (Tmax>6.0s) volume: 139 mL, the majority of which is in the left hemisphere Mismatch Volume:  Significant, at least 100 mL visually. Infarction Location:No core infarct detected in the left hemisphere. CTA NECK Skeleton: Advanced degenerative changes in the cervical spine. No acute osseous abnormality identified. Upper chest: Negative lung apices. No superior mediastinal lymphadenopathy. Other neck: Negative aside from a partially retropharyngeal course of both carotid arteries. No cervical lymphadenopathy. Aortic arch: 3 vessel arch configuration. Moderate soft and calcified arch atherosclerosis. Right carotid system: Negative to the bifurcation. Mild for age bifurcation soft and calcified plaque. Tortuous cervical right ICA with a partially retropharyngeal course, but no atherosclerotic stenosis. Left carotid system: Negative left CCA origin. Minimal plaque at the left carotid bifurcation. Partially retropharyngeal course and tortuosity of the left ICA  without stenosis. Vertebral arteries: No proximal right subclavian artery stenosis. Normal right vertebral artery origin. Tortuous but otherwise negative right vertebral artery to the skullbase. No proximal subclavian artery stenosis despite soft plaque. Normal left vertebral artery origin. Tortuous but otherwise negative left vertebral artery to the skullbase. CTA HEAD Posterior circulation: Mild distal vertebral artery irregularity. No distal vertebral stenosis. Patent PICA origins and vertebrobasilar junction. Patent basilar artery without stenosis. Patent SCA origins. Normal left PCA origin. Fetal type right PCA origin. The left Posterior communicating artery is diminutive or absent. Bilateral PCA branches are within normal limits. Anterior circulation: Both ICA siphons are patent. Tortuosity and mild to moderate calcified plaque, greater on the left. Perhaps moderate but no high-grade left siphon stenosis. Normal right posterior communicating artery origin. Patent carotid termini. Normal MCA and ACA origins. Anterior communicating artery and bilateral ACA branches are within normal limits. Right MCA M1 segment, bifurcation, and right MCA branches are within normal limits. Left MCA M1 segment is occluded 12 mm beyond its origin. The left MCA bifurcation is occluded but there is left M2 and M3 collateral flow (series 11, image 19). Venous sinuses: Patent. Anatomic variants: Fetal right PCA origin. Review of the MIP images confirms the above findings IMPRESSION: 1. Positive for emergent large vessel occlusion at the left MCA M1 segment. Favorable perfusion findings for Interventional revascularization: left hemisphere penumbra but no core infarct detected by CT perfusion. This was discussed by telephone with Dr. Erlinda Hong On 05/18/2017 at 1643 hours. 2. Bilateral ICA siphon calcified plaque is greater on the left but without high-grade stenosis. Tortuous cervical carotid arteries but mild for age carotid bifurcation  atherosclerosis without stenosis. 3. Negative posterior circulation. Electronically Signed   By: Genevie Ann M.D.   On: 05/18/2017 16:59   Mr Brain Wo Contrast  Result Date:  05/19/2017 CLINICAL DATA:  Stroke. Status post revascularization of occluded left MCA. EXAM: MRI HEAD WITHOUT CONTRAST MRA HEAD WITHOUT CONTRAST TECHNIQUE: Multiplanar, multiecho pulse sequences of the brain and surrounding structures were obtained without intravenous contrast. Angiographic images of the head were obtained using MRA technique without contrast. COMPARISON:  Head CT and CTA 05/18/2017 FINDINGS: MRI HEAD FINDINGS Brain: There is a punctate acute cortical infarct at the medial left temporal occipital junction. There is subtly asymmetric increased trace diffusion signal along the left insula without convincing restricted diffusion. No acute large territory infarct is present. There is no evidence of intracranial hemorrhage, mass, midline shift, or extra-axial fluid collection. Scattered cerebral white matter T2 hyperintensities are nonspecific but compatible with minimal chronic small vessel ischemic disease. There is mild cerebral atrophy. Vascular: Major intracranial vascular flow voids are preserved. Skull and upper cervical spine: Unremarkable bone marrow signal. Sinuses/Orbits: Bilateral cataract extraction. Small volume fluid in the left sphenoid sinus. Small mastoid effusions. Other: None. MRA HEAD FINDINGS The visualized distal vertebral arteries are widely patent to the basilar and codominant. Patent PICA and SCA origins are visualized bilaterally. The basilar artery is widely patent. There is a fetal origin of the right PCA. The left posterior communicating artery is diminutive or absent. PCAs are patent without evidence of significant stenosis. The internal carotid arteries are patent from skullbase to carotid termini the cavernous segments are tortuous with mild to possibly moderate stenosis bilaterally. There has been  interval left MCA revascularization. The MCAs and ACAs are patent bilaterally without evidence of proximal branch occlusion or significant proximal stenosis. No intracranial aneurysm is identified. IMPRESSION: 1. Punctate left temporal-occipital infarct. 2. Artifact versus minimal infarction along the left insula. 3. Minimal chronic small vessel ischemic disease. 4. Patent left MCA following revascularization without significant stenosis. 5. Bilateral ICA atherosclerosis with mild-to-moderate narrowing. Electronically Signed   By: Logan Bores M.D.   On: 05/19/2017 16:34   Ct Cerebral Perfusion W Contrast  Result Date: 05/18/2017 CLINICAL DATA:  83 year old female with right facial droop and loss of speech, symptom onset about 1 hour ago. Noncontrast head CT suspicious for low left MCA large vessel occlusion EXAM: CT ANGIOGRAPHY HEAD AND NECK CT PERFUSION BRAIN TECHNIQUE: Multidetector CT imaging of the head and neck was performed using the standard protocol during bolus administration of intravenous contrast. Multiplanar CT image reconstructions and MIPs were obtained to evaluate the vascular anatomy. Carotid stenosis measurements (when applicable) are obtained utilizing NASCET criteria, using the distal internal carotid diameter as the denominator. Multiphase CT imaging of the brain was performed following IV bolus contrast injection. Subsequent parametric perfusion maps were calculated using RAPID software. CONTRAST:  90 mL Isovue 370 COMPARISON:  Noncontrast head CT 1629 hours today. FINDINGS: CT Brain Perfusion Findings: CBF (<30%) Volume: 0 mL in the left hemisphere; erroneous appearing 7 mL core infarct detected in the right hemisphere near the vertex. Perfusion (Tmax>6.0s) volume: 139 mL, the majority of which is in the left hemisphere Mismatch Volume:  Significant, at least 100 mL visually. Infarction Location:No core infarct detected in the left hemisphere. CTA NECK Skeleton: Advanced degenerative  changes in the cervical spine. No acute osseous abnormality identified. Upper chest: Negative lung apices. No superior mediastinal lymphadenopathy. Other neck: Negative aside from a partially retropharyngeal course of both carotid arteries. No cervical lymphadenopathy. Aortic arch: 3 vessel arch configuration. Moderate soft and calcified arch atherosclerosis. Right carotid system: Negative to the bifurcation. Mild for age bifurcation soft and calcified plaque. Tortuous cervical right ICA  with a partially retropharyngeal course, but no atherosclerotic stenosis. Left carotid system: Negative left CCA origin. Minimal plaque at the left carotid bifurcation. Partially retropharyngeal course and tortuosity of the left ICA without stenosis. Vertebral arteries: No proximal right subclavian artery stenosis. Normal right vertebral artery origin. Tortuous but otherwise negative right vertebral artery to the skullbase. No proximal subclavian artery stenosis despite soft plaque. Normal left vertebral artery origin. Tortuous but otherwise negative left vertebral artery to the skullbase. CTA HEAD Posterior circulation: Mild distal vertebral artery irregularity. No distal vertebral stenosis. Patent PICA origins and vertebrobasilar junction. Patent basilar artery without stenosis. Patent SCA origins. Normal left PCA origin. Fetal type right PCA origin. The left Posterior communicating artery is diminutive or absent. Bilateral PCA branches are within normal limits. Anterior circulation: Both ICA siphons are patent. Tortuosity and mild to moderate calcified plaque, greater on the left. Perhaps moderate but no high-grade left siphon stenosis. Normal right posterior communicating artery origin. Patent carotid termini. Normal MCA and ACA origins. Anterior communicating artery and bilateral ACA branches are within normal limits. Right MCA M1 segment, bifurcation, and right MCA branches are within normal limits. Left MCA M1 segment is  occluded 12 mm beyond its origin. The left MCA bifurcation is occluded but there is left M2 and M3 collateral flow (series 11, image 19). Venous sinuses: Patent. Anatomic variants: Fetal right PCA origin. Review of the MIP images confirms the above findings IMPRESSION: 1. Positive for emergent large vessel occlusion at the left MCA M1 segment. Favorable perfusion findings for Interventional revascularization: left hemisphere penumbra but no core infarct detected by CT perfusion. This was discussed by telephone with Dr. Erlinda Hong On 05/18/2017 at 1643 hours. 2. Bilateral ICA siphon calcified plaque is greater on the left but without high-grade stenosis. Tortuous cervical carotid arteries but mild for age carotid bifurcation atherosclerosis without stenosis. 3. Negative posterior circulation. Electronically Signed   By: Genevie Ann M.D.   On: 05/18/2017 16:59   Dg Chest Port 1 View  Result Date: 05/18/2017 CLINICAL DATA:  ETT placement EXAM: PORTABLE CHEST 1 VIEW COMPARISON:  None. FINDINGS: Endotracheal tube tip is 4.4 cm superior to the carina. Small left-sided pleural effusion with left basilar atelectasis or infiltrate. Mild cardiomegaly with central vascular congestion. Atherosclerotic calcifications of the aorta. No pneumothorax. IMPRESSION: 1. Endotracheal tube tip is 4.4 cm superior to the carina 2. Small left-sided pleural effusion with left basilar atelectasis or infiltrate 3. Mild cardiomegaly with central vascular congestion Electronically Signed   By: Donavan Foil M.D.   On: 05/18/2017 21:54   Mr Jodene Nam Head Wo Contrast  Result Date: 05/19/2017 CLINICAL DATA:  Stroke. Status post revascularization of occluded left MCA. EXAM: MRI HEAD WITHOUT CONTRAST MRA HEAD WITHOUT CONTRAST TECHNIQUE: Multiplanar, multiecho pulse sequences of the brain and surrounding structures were obtained without intravenous contrast. Angiographic images of the head were obtained using MRA technique without contrast. COMPARISON:  Head CT  and CTA 05/18/2017 FINDINGS: MRI HEAD FINDINGS Brain: There is a punctate acute cortical infarct at the medial left temporal occipital junction. There is subtly asymmetric increased trace diffusion signal along the left insula without convincing restricted diffusion. No acute large territory infarct is present. There is no evidence of intracranial hemorrhage, mass, midline shift, or extra-axial fluid collection. Scattered cerebral white matter T2 hyperintensities are nonspecific but compatible with minimal chronic small vessel ischemic disease. There is mild cerebral atrophy. Vascular: Major intracranial vascular flow voids are preserved. Skull and upper cervical spine: Unremarkable bone marrow signal. Sinuses/Orbits:  Bilateral cataract extraction. Small volume fluid in the left sphenoid sinus. Small mastoid effusions. Other: None. MRA HEAD FINDINGS The visualized distal vertebral arteries are widely patent to the basilar and codominant. Patent PICA and SCA origins are visualized bilaterally. The basilar artery is widely patent. There is a fetal origin of the right PCA. The left posterior communicating artery is diminutive or absent. PCAs are patent without evidence of significant stenosis. The internal carotid arteries are patent from skullbase to carotid termini the cavernous segments are tortuous with mild to possibly moderate stenosis bilaterally. There has been interval left MCA revascularization. The MCAs and ACAs are patent bilaterally without evidence of proximal branch occlusion or significant proximal stenosis. No intracranial aneurysm is identified. IMPRESSION: 1. Punctate left temporal-occipital infarct. 2. Artifact versus minimal infarction along the left insula. 3. Minimal chronic small vessel ischemic disease. 4. Patent left MCA following revascularization without significant stenosis. 5. Bilateral ICA atherosclerosis with mild-to-moderate narrowing. Electronically Signed   By: Logan Bores M.D.   On:  05/19/2017 16:34   Ct Head Code Stroke Wo Contrast`  Result Date: 05/18/2017 CLINICAL DATA:  Code stroke.  Right facial weakness.  Absent speech. EXAM: CT HEAD WITHOUT CONTRAST TECHNIQUE: Contiguous axial images were obtained from the base of the skull through the vertex without intravenous contrast. COMPARISON:  None. FINDINGS: Brain: No evidence of acute infarction, hemorrhage, hydrocephalus, extra-axial collection or mass lesion/mass effect. Vascular: Dense left distal M1 segment, especially on reformats. Skull: No acute or aggressive finding. Bulky right cervical facet arthropathy at C2-3 Sinuses/Orbits: No acute finding Other: Page to Dr Erlinda Hong placed 05/18/2017 at 4:30 pm. CT perfusion in progress. ASPECTS [facility] Stroke Program Early CT Score) - Ganglionic level infarction (caudate, lentiform nuclei, internal capsule, insula, M1-M3 cortex): 7 - Supraganglionic infarction (M4-M6 cortex): 3 Total score (0-10 with 10 being normal): 10 IMPRESSION: Evidence of distal left M1 thrombus. No hemorrhage or visible infarct. Aspects is 10. Electronically Signed   By: Monte Fantasia M.D.   On: 05/18/2017 16:33    Labs:  CBC:  Recent Labs  05/18/17 1615 05/18/17 1620 05/19/17 0602 05/20/17 0245  WBC 9.4  --  7.3 8.8  HGB 10.8* 12.9 9.7* 10.6*  HCT 34.1* 38.0 30.5* 33.3*  PLT 218  --  180 212    COAGS:  Recent Labs  05/18/17 1615  INR 1.36  APTT 29    BMP:  Recent Labs  05/18/17 1615 05/18/17 1620 05/19/17 0602 05/20/17 0245 05/21/17 0524  NA 139 143 141 138 134*  K 2.9* 2.8* 3.7 3.0* 3.7  CL 107 105 112* 108 104  CO2 21*  --  20* 21* 22  GLUCOSE 88 94 81 83 86  BUN 12 12 7  <5* 11  CALCIUM 8.5*  --  7.9* 8.0* 8.6*  CREATININE 1.03* 1.00 0.81 0.82 1.16*  GFRNONAA 50*  --  >60 >60 43*  GFRAA 58*  --  >60 >60 50*    LIVER FUNCTION TESTS:  Recent Labs  05/18/17 1615 05/20/17 0245  BILITOT 1.1  --   AST 29  --   ALT 24  --   ALKPHOS 77  --   PROT 7.6  --   ALBUMIN 3.9  3.2*    Assessment and Plan: 1. CVA S/p COMPLETE REVASCULARIZATION OF OCCLUDED LT MCA  Patient doing very well.  She is hoping to go home. We will have her return to see Dr. Estanislado Pandy in 2 weeks after discharge. Further care per neurology.  Electronically Signed:  Avrian Delfavero E 05/21/2017, 11:58 AM   I spent a total of 15 Minutes at the the patient's bedside AND on the patient's hospital floor or unit, greater than 50% of which was counseling/coordinating care for CVA

## 2017-05-21 NOTE — Therapy (Signed)
Occupational Therapy Treatment Patient Details Name: Amber Barnett MRN: [id number] DOB: 08-12-1934 Today's Date: 05/21/2017    History of present illness 83 y.o.female with PMHx Left Breast CA s/p surgery, HTN, Afib, DVT on anticoagulation with eliquis presents after acute onset of aphasia and right sided weakness. CTH showed left M1 CVA.Required intubation s/p IR mechanical thrombectomy for revascularization 9/28-9/29/18.   OT comments  Pt continues to make good progress towards goals. Pt requires supervision overall for safety with ADLs and functional mobility. Pt able to complete simulated tub shower transfer with supervision and pt was advised to use 3in1 or sponge bathe initially upon d/c. OT will continue to follow acutely to address established goals.    Follow Up Recommendations  No OT follow up;Supervision/Assistance - 24 hour    Equipment Recommendations  3 in 1 bedside commode    Recommendations for Other Services      Precautions / Restrictions Precautions Precautions: Fall Precaution Comments: Watch HR and BP Restrictions Weight Bearing Restrictions: No       Mobility Bed Mobility Overal bed mobility: Needs Assistance Bed Mobility: Supine to Sit     Supine to sit: Supervision     General bed mobility comments: Pt OOB in recliner upon arrival  Transfers Overall transfer level: Needs assistance Equipment used: None Transfers: Sit to/from Stand Sit to Stand: Supervision         General transfer comment: no c/o dizziness or lightheadness on standing. vitals stable throughout. supervision for safety    Balance Overall balance assessment: Needs assistance Sitting-balance support: No upper extremity supported;Feet supported Sitting balance-Leahy Scale: Good     Standing balance support: No upper extremity supported;During functional activity Standing balance-Leahy Scale: Good Standing balance comment: Pt able to pick up items off the floor with no  LOB or report of dizziness.                            ADL either performed or assessed with clinical judgement   ADL Overall ADL's : Needs assistance/impaired     Grooming: Wash/dry hands;Supervision/safety;Standing Grooming Details (indicate cue type and reason): Pt able to stand at the sink and complete grooming task with supervision for safety. No LOB while standing.              Lower Body Dressing: Supervision/safety;Sitting/lateral leans Lower Body Dressing Details (indicate cue type and reason): Pt able to sit EOB and don socks with no LOB.  Toilet Transfer: Supervision/safety;Ambulation;RW   Toileting- Clothing Manipulation and Hygiene: Modified independent;Sit to/from stand   Tub/ Shower Transfer: Supervision/safety;Set up;Ambulation;3 in 1 Tub/Shower Transfer Details (indicate cue type and reason): Simulated tub shower transfer. Pt able to bring feet up high enough to clear bathtub. Pt reports she usually takes a bath in the tub. Pt and family advised to use the 3in1 or sponge bathe initially upon d/c.  Functional mobility during ADLs: Supervision/safety;Rolling walker       Vision   Additional Comments: Pt reports having glaucoma   Perception     Praxis      Cognition Arousal/Alertness: Awake/alert Behavior During Therapy: WFL for tasks assessed/performed Overall Cognitive Status: Within Functional Limits for tasks assessed                                          Exercises     Shoulder Instructions  General Comments Pt's family present during session. Pts UE strength appears equal bilaterally and fine motor skills appear intact. Pt reports no current sensory deficits in RUE.  Pt reports desire to return to gardening and doing some light cooking.     Pertinent Vitals/ Pain       Pain Assessment: No/denies pain Pain Intervention(s): Monitored during session  Home Living                                           Prior Functioning/Environment              Frequency  Min 2X/week        Progress Toward Goals  OT Goals(current goals can now be found in the care plan section)  Progress towards OT goals: Progressing toward goals  Acute Rehab OT Goals Patient Stated Goal: Be able to get back to gardening and cooking OT Goal Formulation: With patient Time For Goal Achievement: 06/03/17 Potential to Achieve Goals: Good  Plan Discharge plan remains appropriate    Co-evaluation                 AM-PAC PT "6 Clicks" Daily Activity     Outcome Measure   Help from another person eating meals?: None Help from another person taking care of personal grooming?: None Help from another person toileting, which includes using toliet, bedpan, or urinal?: None Help from another person bathing (including washing, rinsing, drying)?: A Little Help from another person to put on and taking off regular upper body clothing?: None Help from another person to put on and taking off regular lower body clothing?: A Little 6 Click Score: 22    End of Session Equipment Utilized During Treatment: Rolling walker;Gait belt  OT Visit Diagnosis: Unsteadiness on feet (R26.81);Muscle weakness (generalized) (M62.81)   Activity Tolerance Patient tolerated treatment well   Patient Left in chair;with call bell/phone within reach;with chair alarm set;with family/visitor present   Nurse Communication Mobility status (Pt requesting information about d/c.)        Time: 0355-9741 OT Time Calculation (min): 22 min  Charges:    Boykin Peek, OTS [phone]    Boykin Peek 05/21/2017, 5:44 PM

## 2017-05-22 ENCOUNTER — Encounter (HOSPITAL_COMMUNITY): Payer: Self-pay | Admitting: Interventional Radiology

## 2017-05-23 ENCOUNTER — Encounter (HOSPITAL_COMMUNITY): Payer: Self-pay | Admitting: Interventional Radiology

## 2017-05-25 ENCOUNTER — Telehealth (HOSPITAL_COMMUNITY): Payer: Self-pay

## 2017-05-25 NOTE — Telephone Encounter (Signed)
Called to schedule 2 wk f/u, left message for pt to return call. AW 

## 2017-05-28 ENCOUNTER — Ambulatory Visit: Payer: Medicare HMO | Attending: Neurology | Admitting: Physical Therapy

## 2017-05-28 ENCOUNTER — Other Ambulatory Visit (HOSPITAL_COMMUNITY): Payer: Self-pay | Admitting: Interventional Radiology

## 2017-05-28 DIAGNOSIS — R279 Unspecified lack of coordination: Secondary | ICD-10-CM | POA: Diagnosis present

## 2017-05-28 DIAGNOSIS — R2681 Unsteadiness on feet: Secondary | ICD-10-CM

## 2017-05-28 DIAGNOSIS — I639 Cerebral infarction, unspecified: Secondary | ICD-10-CM

## 2017-05-28 NOTE — Therapy (Signed)
[facility] [address]  [place], [place], [address] Phone: [phone]   Fax:  [phone]  Physical Therapy Evaluation  Patient Details  Name: Amber Barnett MRN: [id number] Date of Birth: Oct 17, 1933 Referring Provider: Antony Contras, MD  Encounter Date: 05/28/2017      PT End of Session - 05/28/17 1445    Visit Number 1   Number of Visits 8   Date for PT Re-Evaluation 06/29/17   Authorization Type [employer]   PT Start Time 1402   PT Stop Time 1445   PT Time Calculation (min) 43 min   Activity Tolerance Patient tolerated treatment well   Behavior During Therapy [facility] for tasks assessed/performed      Past Medical History:  Diagnosis Date  . Atrial fibrillation ([place])   . DVT (deep venous thrombosis) ([place])   . History of left breast cancer   . Hypertension     Past Surgical History:  Procedure Laterality Date  . IR ANGIO INTRA EXTRACRAN SEL COM CAROTID INNOMINATE UNI R MOD SED  05/18/2017  . IR ANGIO VERTEBRAL SEL SUBCLAVIAN INNOMINATE UNI R MOD SED  05/18/2017  . IR PERCUTANEOUS ART THROMBECTOMY/INFUSION INTRACRANIAL INC DIAG ANGIO  05/18/2017  . RADIOLOGY WITH ANESTHESIA N/A 05/18/2017   Procedure: RADIOLOGY WITH ANESTHESIA;  Surgeon: Luanne Bras, MD;  Location: [facility];  Service: Radiology;  Laterality: N/A;    There were no vitals filed for this visit.       Subjective Assessment - 05/28/17 1407    Subjective Pt hospitalized for CVA 05/18/17 - 05/21/17. Reports she had been out shopping with her neices when began having R weakness and difficulty speaking and her neices called [phone].  Underwent IR for revacularization of L MCA with resolution of most of symtpoms. At present, only noting some mild balance deficits.   Pertinent History CVA 05/18/17   Currently in Pain? No/denies   Pain Score 0-No pain            [facility] PT Assessment - 05/28/17 1402      Assessment   Medical Diagnosis  CVA - R hemiparesis & aphasia   Referring Provider Antony Contras, MD   Onset Date/Surgical Date 05/18/17   Hand Dominance Right   Next MD Visit 07/10/17     Balance Screen   Has the patient fallen in the past 6 months No   Has the patient had a decrease in activity level because of a fear of falling?  No   Is the patient reluctant to leave their home because of a fear of falling?  No     Home Social worker Private residence   Living Arrangements Other relatives;Non-relatives/Friends   Available Help at Discharge Family;Friend(s)   Type of [place] to enter   Entrance Stairs-Number of Steps 5 or 3   Entrance Stairs-Rails Right   Home Layout One level   Home Equipment None     Prior Function   Level of Independence Independent   Vocation Retired   Land, quilting, walking her dog daily ~4 blocks     Sensation   Light Touch Appears Intact     Coordination   Gross Motor Movements are Fluid and Coordinated Yes   Finger Nose Finger Test normal   Heel Shin Test normal     ROM / Strength   AROM / PROM / Strength Strength     Strength  Overall Strength Within functional limits for tasks performed   Overall Strength Comments B LE grossly 4/5 to 4+/5     Ambulation/Gait   Gait velocity 3.66 ft/sec     Standardized Balance Assessment   Standardized Balance Assessment Berg Balance Test;Timed Up and Go Test;10 meter walk test   10 Meter Walk 8.96     Berg Balance Test   Sit to Stand Able to stand without using hands and stabilize independently   Standing Unsupported Able to stand safely 2 minutes   Sitting with Back Unsupported but Feet Supported on Floor or Stool Able to sit safely and securely 2 minutes   Stand to Sit Sits safely with minimal use of hands   Transfers Able to transfer safely, minor use of hands   Standing Unsupported with Eyes Closed Able to stand 10 seconds safely   Standing Ubsupported with  Feet Together Able to place feet together independently and stand 1 minute safely   From Standing, Reach Forward with Outstretched Arm Can reach confidently >25 cm (10")   From Standing Position, Pick up Object from Floor Able to pick up shoe safely and easily   From Standing Position, Turn to Look Behind Over each Shoulder Looks behind from both sides and weight shifts well   Turn 360 Degrees Able to turn 360 degrees safely in 4 seconds or less   Standing Unsupported, Alternately Place Feet on Step/Stool Able to stand independently and safely and complete 8 steps in 20 seconds   Standing Unsupported, One Foot in Front Able to plae foot ahead of the other independently and hold 30 seconds   Standing on One Leg Able to lift leg independently and hold > 10 seconds   Total Score 55     Timed Up and Go Test   Normal TUG (seconds) 8.41   Manual TUG (seconds) 9.97   Cognitive TUG (seconds) 14.69     Functional Gait  Assessment   Gait assessed  Yes   Gait Level Surface Walks 20 ft in less than 5.5 sec, no assistive devices, good speed, no evidence for imbalance, normal gait pattern, deviates no more than 6 in outside of the 12 in walkway width.   Change in Gait Speed Able to change speed, demonstrates mild gait deviations, deviates 6-10 in outside of the 12 in walkway width, or no gait deviations, unable to achieve a major change in velocity, or uses a change in velocity, or uses an assistive device.   Gait with Horizontal Head Turns Performs head turns smoothly with slight change in gait velocity (eg, minor disruption to smooth gait path), deviates 6-10 in outside 12 in walkway width, or uses an assistive device.   Gait with Vertical Head Turns Performs task with slight change in gait velocity (eg, minor disruption to smooth gait path), deviates 6 - 10 in outside 12 in walkway width or uses assistive device   Gait and Pivot Turn Pivot turns safely within 3 sec and stops quickly with no loss of  balance.   Step Over Obstacle Is able to step over one shoe box (4.5 in total height) without changing gait speed. No evidence of imbalance.   Gait with Narrow Base of Support Ambulates less than 4 steps heel to toe or cannot perform without assistance.   Gait with Eyes Closed Walks 20 ft, slow speed, abnormal gait pattern, evidence for imbalance, deviates 10-15 in outside 12 in walkway width. Requires more than 9 sec to ambulate 20 ft.  Ambulating Backwards Walks 20 ft, slow speed, abnormal gait pattern, evidence for imbalance, deviates 10-15 in outside 12 in walkway width.   Steps Alternating feet, must use rail.   Total Score 18            Objective measurements completed on examination: See above findings.                       PT Long Term Goals - 06/11/2017 1445      PT LONG TERM GOAL #1   Title Independent with ongoing HEP +/- fall prevention program   Status New   Target Date 06/29/17     PT LONG TERM GOAL #2   Title FGA >/= 25/30 to reduce risk for falls   Status New   Target Date 06/29/17                Plan - 2017-06-11 1445    Clinical Impression Statement Amber Barnett is an 83 y/o female referred to OP PT following acute CVA on 05/18/17. Pt presented to the hospital with R hemiparesis and aphasia, but underwent interventional radiology with complete revascularization of occluded Lt MCA with resultant resolution of majority of deficits. Pt noting no current concerns with strength or speech, but notes some minor balance deficits. B UE & LE strength grossly 4/5 to 4+/5 with sensation and coordination intact by gross assessment. Static standing balance with deficits noted only in tandem stance, but dynamic gait assessment with FGA score of 18/30 indicating increased for falls. Amber Barnett demonstrates good potential to benefit from skilled PT for balance and dynamic gait training along with training in fall prevention program to reduce risk for further injury.    History and Personal Factors relevant to plan of care: h/o afib, DVT, HTN   Clinical Presentation Stable   Clinical Decision Making Low   Rehab Potential Excellent   PT Frequency 2x / week   PT Duration 4 weeks   PT Treatment/Interventions Patient/family education;Neuromuscular re-education;Balance training;Therapeutic exercise;Functional mobility training;Gait training;Stair training;ADLs/Self Care Home Management   Consulted and Agree with Plan of Care Patient      Patient will benefit from skilled therapeutic intervention in order to improve the following deficits and impairments:  Decreased balance, Decreased coordination, Difficulty walking, Decreased activity tolerance, Decreased safety awareness  Visit Diagnosis: Unsteadiness on feet  Unspecified lack of coordination      G-Codes - 06/11/17 1445    Functional Assessment Tool Used (Outpatient Only) FGA = 18/30 (40% limitation)   Functional Limitation Mobility: Walking and moving around   Mobility: Walking and Moving Around Current Status ([id number]) At least 40 percent but less than 60 percent impaired, limited or restricted   Mobility: Walking and Moving Around Goal Status [phone]) At least 20 percent but less than 40 percent impaired, limited or restricted       Problem List Patient Active Problem List   Diagnosis Date Noted  . Current use of long term anticoagulation 05/21/2017  . Cerebral embolism with cerebral infarction ([place]) 05/18/2017  . Essential hypertension 05/08/2017  . Permanent atrial fibrillation ([place]) 05/08/2017    Percival Spanish, PT, MPT 11-Jun-2017, 7:03 PM  [facility] [address]  [address] [place], [place], [address] Phone: [phone]   Fax:  [phone]  Name: Amber Barnett MRN: [id number] Date of Birth: 1933/12/31

## 2017-05-30 ENCOUNTER — Ambulatory Visit: Payer: Medicare HMO

## 2017-05-30 DIAGNOSIS — R2681 Unsteadiness on feet: Secondary | ICD-10-CM | POA: Diagnosis not present

## 2017-05-30 DIAGNOSIS — R279 Unspecified lack of coordination: Secondary | ICD-10-CM

## 2017-05-30 NOTE — Therapy (Signed)
[facility] [address]  [place], [place], [address] Phone: [phone]   Fax:  [phone]  Physical Therapy Treatment  Patient Details  Name: Amber Barnett MRN: [id number] Date of Birth: [birth year] Referring Provider: Antony Contras, MD  Encounter Date: 05/30/2017      PT End of Session - 05/30/17 1726    Visit Number 2   Number of Visits 8   Date for PT Re-Evaluation 06/29/17   Authorization Type [employer]   PT Start Time 1702   PT Stop Time 1748   PT Time Calculation (min) 46 min   Activity Tolerance Patient tolerated treatment well   Behavior During Therapy [facility] for tasks assessed/performed      Past Medical History:  Diagnosis Date  . Atrial fibrillation ([place])   . DVT (deep venous thrombosis) ([place])   . History of left breast cancer   . Hypertension     Past Surgical History:  Procedure Laterality Date  . IR ANGIO INTRA EXTRACRAN SEL COM CAROTID INNOMINATE UNI R MOD SED  05/18/2017  . IR ANGIO VERTEBRAL SEL SUBCLAVIAN INNOMINATE UNI R MOD SED  05/18/2017  . IR PERCUTANEOUS ART THROMBECTOMY/INFUSION INTRACRANIAL INC DIAG ANGIO  05/18/2017  . RADIOLOGY WITH ANESTHESIA N/A 05/18/2017   Procedure: RADIOLOGY WITH ANESTHESIA;  Surgeon: Luanne Bras, MD;  Location: [facility];  Service: Radiology;  Laterality: N/A;    There were no vitals filed for this visit.      Subjective Assessment - 05/30/17 1726    Subjective Pt. doing well today.  Amber Barnett verbalizing that she wants her balance to improve with therapy.     Currently in Pain? No/denies   Pain Score 0-No pain   Multiple Pain Sites No                         [facility] Adult PT Treatment/Exercise - 05/30/17 1728      Neuro Re-ed    Neuro Re-ed Details  Tandem walk at counter forward/backward with supervision x 2 laps; light UE support at counter; heel walk, toe-walk at coutner down/back x 2 laps with supervision     Knee/Hip Exercises: Aerobic   Nustep Lvl 4, 7 min      Knee/Hip Exercises: Standing   Heel Raises 1 set;15 reps   Heel Raises Limitations at counter   SLS B SLS with light chair support 3 x 10 sec each   Other Standing Knee Exercises Alternating lunge onto BOSU ball (up) with cross over reach to cone on bolster x 10 reps each side   Other Standing Knee Exercises Alternating lunge onto BOSU ball (up) x 10 reps each side; 2 ski pole support and supervision     Knee/Hip Exercises: Supine   Bridges Both;10 reps   Bridges with Clamshell Both;10 reps  with red TB at knees    Other Supine Knee/Hip Exercises Hooklying alternating march with red TB at knees x 10 reps                PT Education - 05/30/17 1753    Education provided Yes   Education Details Heel raise, toe raise at chair, SLS, tandem stance at chair, bridge with abduction red TB issued to pt.   Person(s) Educated Patient   Methods Explanation;Demonstration;Verbal cues;Handout   Comprehension Verbalized understanding;Returned demonstration;Verbal cues required;Need further instruction             PT Long Term Goals -  05/30/17 1727      PT LONG TERM GOAL #1   Title Independent with ongoing HEP +/- fall prevention program   Status On-going     PT LONG TERM GOAL #2   Title FGA >/= 25/30 to reduce risk for falls   Status On-going               Plan - 05/30/17 1727    Clinical Impression Statement Amber Barnett doing well today.  Tolerated all LE strengthening and balance training well today.  Initial HEP for balance and LE strengthening issued to pt. today via handout with pt. able to perform all activities safely and without pain.  Will monitor pt. response to HEP at upcoming visit.     PT Treatment/Interventions Patient/family education;Neuromuscular re-education;Balance training;Therapeutic exercise;Functional mobility training;Gait training;Stair training;ADLs/Self Care Home Management      Patient will  benefit from skilled therapeutic intervention in order to improve the following deficits and impairments:  Decreased balance, Decreased coordination, Difficulty walking, Decreased activity tolerance, Decreased safety awareness  Visit Diagnosis: Unsteadiness on feet  Unspecified lack of coordination     Problem List Patient Active Problem List   Diagnosis Date Noted  . Current use of long term anticoagulation 05/21/2017  . Cerebral embolism with cerebral infarction ([place]) 05/18/2017  . Essential hypertension 05/08/2017  . Permanent atrial fibrillation ([place]) 05/08/2017    Bess Harvest, PTA 05/30/17 5:59 PM  [facility] [address]  [place], [place], [address] Phone: [phone]   Fax:  [phone]  Name: Amber Barnett MRN: [id number] Date of Birth: [birth year]

## 2017-06-04 ENCOUNTER — Ambulatory Visit: Payer: Medicare HMO | Admitting: Physical Therapy

## 2017-06-06 ENCOUNTER — Ambulatory Visit: Payer: Medicare HMO

## 2017-06-06 DIAGNOSIS — R279 Unspecified lack of coordination: Secondary | ICD-10-CM

## 2017-06-06 DIAGNOSIS — R2681 Unsteadiness on feet: Secondary | ICD-10-CM | POA: Diagnosis not present

## 2017-06-06 NOTE — Therapy (Signed)
[facility] [address]  [place], [place], [address] Phone: [phone]   Fax:  [phone]  Physical Therapy Treatment  Patient Details  Name: Amber Barnett MRN: [id number] Date of Birth: April 09, 1934 Referring Provider: Antony Contras, MD  Encounter Date: 06/06/2017      PT End of Session - 06/06/17 1321    Visit Number 3   Number of Visits 8   Date for PT Re-Evaluation 06/29/17   Authorization Type [employer]   PT Start Time 1316   PT Stop Time 1359   PT Time Calculation (min) 43 min   Activity Tolerance Patient tolerated treatment well   Behavior During Therapy [facility] for tasks assessed/performed      Past Medical History:  Diagnosis Date  . Atrial fibrillation ([place])   . DVT (deep venous thrombosis) ([place])   . History of left breast cancer   . Hypertension     Past Surgical History:  Procedure Laterality Date  . IR ANGIO INTRA EXTRACRAN SEL COM CAROTID INNOMINATE UNI R MOD SED  05/18/2017  . IR ANGIO VERTEBRAL SEL SUBCLAVIAN INNOMINATE UNI R MOD SED  05/18/2017  . IR PERCUTANEOUS ART THROMBECTOMY/INFUSION INTRACRANIAL INC DIAG ANGIO  05/18/2017  . RADIOLOGY WITH ANESTHESIA N/A 05/18/2017   Procedure: RADIOLOGY WITH ANESTHESIA;  Surgeon: Luanne Bras, MD;  Location: [facility];  Service: Radiology;  Laterality: N/A;    There were no vitals filed for this visit.      Subjective Assessment - 06/06/17 1320    Subjective Pt. reporting HEP has been going well and that she is performing these activities at counter top.  Pt. noting hot bath relieved muscle soreness day of last treatment and felt good next day.     Currently in Pain? No/denies   Pain Score 0-No pain   Multiple Pain Sites No                         [facility] Adult PT Treatment/Exercise - 06/06/17 1321      High Level Balance   High Level Balance Activities Head turns;Tandem walking   High Level Balance Comments tandem  walk (forward), forward walk with R<>L head turns, forward walk with up<>down head turns; in hallway x 50 ft each way  cues provided for consistent pacing; some difficulty     Neuro Re-ed    Neuro Re-ed Details  Tandem walk at counter forward/backward with supervision x 2 laps; light UE support at counter; heel walk, toe-walk at coutner down/back x 2 laps with supervision     Knee/Hip Exercises: Aerobic   Nustep Lvl 5, 4 min      Knee/Hip Exercises: Standing   Heel Raises 1 set;15 reps   Heel Raises Limitations + toe raise; at counter   SLS B SLS with light chair support 3 x 10 sec each   Other Standing Knee Exercises Alternating lunge onto 8" step while standing on airex pad with cross over reach to cone on bolster x 15 reps each side; intermittent chair support and supervision from therapist    Other Standing Knee Exercises B tandem stance 2 x 10 sec each at counter; supervision from therapist      Knee/Hip Exercises: Supine   Bridges with Clamshell Both;10 reps  5" hold; with sustained hip abd/ER with red TB at knees      Knee/Hip Exercises: Sidelying   Clams B clam shell (w/o resistance) x 15 reps  PT Education - 06/06/17 1414    Education provided Yes   Education Details clam shell (no resistance)   Person(s) Educated Patient   Methods Explanation;Demonstration;Verbal cues;Handout   Comprehension Verbalized understanding;Returned demonstration;Verbal cues required;Need further instruction             PT Long Term Goals - 05/30/17 1727      PT LONG TERM GOAL #1   Title Independent with ongoing HEP +/- fall prevention program   Status On-going     PT LONG TERM GOAL #2   Title FGA >/= 25/30 to reduce risk for falls   Status On-going               Plan - 06/06/17 1420    Clinical Impression Statement Amber Barnett doing well today and reports performing HEP 1-2x/day.  Notes most difficulty with tandem stance activity.  HEP reviewed today  with pt. able to demo good overall safety with activities.  Had most difficulty in today's treatment with tandem walk with head turns.  Some difficulty maintaining consistent pacing and straying ~ 12" with head turns in hallway.  Will continue to progress dynamic gait and balance training, as pt. is able in coming visits.     PT Treatment/Interventions Patient/family education;Neuromuscular re-education;Balance training;Therapeutic exercise;Functional mobility training;Gait training;Stair training;ADLs/Self Care Home Management      Patient will benefit from skilled therapeutic intervention in order to improve the following deficits and impairments:  Decreased balance, Decreased coordination, Difficulty walking, Decreased activity tolerance, Decreased safety awareness  Visit Diagnosis: Unsteadiness on feet  Unspecified lack of coordination     Problem List Patient Active Problem List   Diagnosis Date Noted  . Current use of long term anticoagulation 05/21/2017  . Cerebral embolism with cerebral infarction ([place]) 05/18/2017  . Essential hypertension 05/08/2017  . Permanent atrial fibrillation ([place]) 05/08/2017    Bess Harvest, PTA 06/06/17 2:30 PM  [facility] [address]  [place], [place], [address] Phone: [phone]   Fax:  [phone]  Name: Amber Barnett MRN: [id number] Date of Birth: March 15, 1934

## 2017-06-11 ENCOUNTER — Ambulatory Visit (HOSPITAL_COMMUNITY): Payer: Medicare HMO

## 2017-06-11 ENCOUNTER — Telehealth (HOSPITAL_COMMUNITY): Payer: Self-pay

## 2017-06-11 NOTE — Telephone Encounter (Signed)
Called to reschedule pt's consult, left message for pt's niece to return call. AW

## 2017-06-12 ENCOUNTER — Ambulatory Visit: Payer: Medicare HMO | Admitting: Physical Therapy

## 2017-06-14 ENCOUNTER — Ambulatory Visit: Payer: Medicare HMO

## 2017-06-14 DIAGNOSIS — R2681 Unsteadiness on feet: Secondary | ICD-10-CM | POA: Diagnosis not present

## 2017-06-14 DIAGNOSIS — R279 Unspecified lack of coordination: Secondary | ICD-10-CM

## 2017-06-14 NOTE — Therapy (Addendum)
[facility] [address]  [place], [place], [address] Phone: [phone]   Fax:  [phone]  Physical Therapy Treatment  Patient Details  Name: Amber Barnett MRN: [id number] Date of Birth: June 13, 1934 Referring Provider: Antony Contras, MD  Encounter Date: 06/14/2017      PT End of Session - 06/14/17 1407    Visit Number 4   Number of Visits 8   Date for PT Re-Evaluation 06/29/17   Authorization Type [employer]   PT Start Time 1401   PT Stop Time 1445   PT Time Calculation (min) 44 min   Activity Tolerance Patient tolerated treatment well   Behavior During Therapy [facility] for tasks assessed/performed      Past Medical History:  Diagnosis Date  . Atrial fibrillation ([place])   . DVT (deep venous thrombosis) ([place])   . History of left breast cancer   . Hypertension     Past Surgical History:  Procedure Laterality Date  . IR ANGIO INTRA EXTRACRAN SEL COM CAROTID INNOMINATE UNI R MOD SED  05/18/2017  . IR ANGIO VERTEBRAL SEL SUBCLAVIAN INNOMINATE UNI R MOD SED  05/18/2017  . IR PERCUTANEOUS ART THROMBECTOMY/INFUSION INTRACRANIAL INC DIAG ANGIO  05/18/2017  . RADIOLOGY WITH ANESTHESIA N/A 05/18/2017   Procedure: RADIOLOGY WITH ANESTHESIA;  Surgeon: Luanne Bras, MD;  Location: [facility];  Service: Radiology;  Laterality: N/A;    There were no vitals filed for this visit.      Subjective Assessment - 06/14/17 1406    Subjective Pt. reporting she had some dizziness Tuesday after walking dog which was relieved after sitting and drinking water.     Currently in Pain? No/denies   Pain Score 0-No pain   Multiple Pain Sites No                         [facility] Adult PT Treatment/Exercise - 06/14/17 1411      Neuro Re-ed    Neuro Re-ed Details  Corner balance with chair in front of pt. and supervision: standing rhomberg position, narrow stance with head turns side<>side, up<>down standing on  airex pad x 30 sec each way; tandem stance, rhomberg position with head turns side<>side, up<>down x 30 sec each way     Knee/Hip Exercises: Aerobic   Stationary Bike Lvl 1, 6 min      Knee/Hip Exercises: Standing   Heel Raises 1 set;15 reps   Heel Raises Limitations + toe raise; at counter     Knee/Hip Exercises: Seated   Sit to Sand 10 reps;with UE support  airex pad      Knee/Hip Exercises: Supine   Bridges Both;15 reps   Other Supine Knee/Hip Exercises Hooklying alternating march with red TB at knees x 15 reps   Other Supine Knee/Hip Exercises Hooklying alternating clam with red TB at knees x 15 rpes each side     Knee/Hip Exercises: Sidelying   Clams B clam shell with red TB x 10 reps each side      Ankle Exercises: Standing   Heel Raises --  at counter x 2 laps down back    Heel Raises Limitations intermittent UE support on counter and supervision   Toe Raise --  x 2 laps down back at counter    Toe Raise Limitations intermittent UE support on counter and supervision   Other Standing Ankle Exercises tandem forward/backward walk at counter x 2 laps down back  PT Education - 06/14/17 1605    Education provided Yes   Education Details corner balance with head turns in rhomberg position (with chair in front)   Person(s) Educated Patient   Methods Explanation;Demonstration;Verbal cues;Handout   Comprehension Verbalized understanding;Returned demonstration;Verbal cues required;Need further instruction             PT Long Term Goals - 05/30/17 1727      PT LONG TERM GOAL #1   Title Independent with ongoing HEP +/- fall prevention program   Status On-going     PT LONG TERM GOAL #2   Title FGA >/= 25/30 to reduce risk for falls   Status On-going               Plan - 06/14/17 1606    Clinical Impression Statement Amber Barnett doing well today only concern was some short-lasting dizziness which she had following walking dog a few days ago  which was relieved after sitting and drinking water.  Tolerated all balance and strengthening activities well.  initiated corner balance with pt. most challanged with narrow stance, rhomberg head turns.  HEP updated with rhomberg corner balance with pt. instructed to use chair support for safety.  Pt. Progressing toward goals well.   PT Treatment/Interventions Patient/family education;Neuromuscular re-education;Balance training;Therapeutic exercise;Functional mobility training;Gait training;Stair training;ADLs/Self Care Home Management      Patient will benefit from skilled therapeutic intervention in order to improve the following deficits and impairments:  Decreased balance, Decreased coordination, Difficulty walking, Decreased activity tolerance, Decreased safety awareness  Visit Diagnosis: Unsteadiness on feet  Unspecified lack of coordination     Problem List Patient Active Problem List   Diagnosis Date Noted  . Current use of long term anticoagulation 05/21/2017  . Cerebral embolism with cerebral infarction ([place]) 05/18/2017  . Essential hypertension 05/08/2017  . Permanent atrial fibrillation ([place]) 05/08/2017   Bess Harvest, PTA 06/14/17 4:21 PM  [facility] [address]  [place], [place], [address] Phone: [phone]   Fax:  [phone]  Name: Amber Barnett MRN: [id number] Date of Birth: 04/06/1934

## 2017-06-18 ENCOUNTER — Ambulatory Visit: Payer: Medicare HMO

## 2017-06-20 ENCOUNTER — Ambulatory Visit: Payer: Medicare HMO

## 2017-06-25 ENCOUNTER — Ambulatory Visit: Payer: Medicare HMO | Admitting: Physical Therapy

## 2017-06-28 ENCOUNTER — Encounter: Payer: Self-pay | Admitting: Physical Therapy

## 2017-06-28 ENCOUNTER — Ambulatory Visit: Payer: Medicare HMO | Attending: Neurology | Admitting: Physical Therapy

## 2017-06-28 DIAGNOSIS — R2681 Unsteadiness on feet: Secondary | ICD-10-CM | POA: Insufficient documentation

## 2017-06-28 DIAGNOSIS — R279 Unspecified lack of coordination: Secondary | ICD-10-CM | POA: Diagnosis present

## 2017-06-28 NOTE — Therapy (Addendum)
[facility] [address]  [place], [place], [address] Phone: [phone]   Fax:  [phone]  Physical Therapy Treatment  Patient Details  Name: Amber Barnett MRN: [id number] Date of Birth: Sep 12, 1933 Referring Provider: Antony Contras, MD   Encounter Date: 06/28/2017  PT End of Session - 06/28/17 1448    Visit Number  5    Number of Visits  8    Date for PT Re-Evaluation  06/29/17    Authorization Type  [employer] Medicare    PT Start Time  9379    PT Stop Time  1538    PT Time Calculation (min)  50 min    Activity Tolerance  Patient tolerated treatment well    Behavior During Therapy  [facility] for tasks assessed/performed       Past Medical History:  Diagnosis Date  . Atrial fibrillation ([place])   . DVT (deep venous thrombosis) ([place])   . History of left breast cancer   . Hypertension     Past Surgical History:  Procedure Laterality Date  . IR ANGIO INTRA EXTRACRAN SEL COM CAROTID INNOMINATE UNI R MOD SED  05/18/2017  . IR ANGIO VERTEBRAL SEL SUBCLAVIAN INNOMINATE UNI R MOD SED  05/18/2017  . IR PERCUTANEOUS ART THROMBECTOMY/INFUSION INTRACRANIAL INC DIAG ANGIO  05/18/2017    There were no vitals filed for this visit.  Subjective Assessment - 06/28/17 1451    Subjective  Pt reports she has been trying to keep up her HEP but reports she is having difficulty with tandem gait.    Currently in Pain?  No/denies    Pain Score  0-No pain         [facility] PT Assessment - 06/28/17 1448      Assessment   Medical Diagnosis  CVA - R hemiparesis & aphasia    Referring Provider  Antony Contras, MD    Onset Date/Surgical Date  05/18/17    Hand Dominance  Right    Next MD Visit  07/10/17      Strength   Overall Strength Comments  B LE grossly 4/5 to 4+/5      Standardized Balance Assessment   10 Meter Walk  6.63      Functional Gait  Assessment   Gait Level Surface  Walks 20 ft in less than 5.5 sec, no assistive  devices, good speed, no evidence for imbalance, normal gait pattern, deviates no more than 6 in outside of the 12 in walkway width.    Change in Gait Speed  Able to smoothly change walking speed without loss of balance or gait deviation. Deviate no more than 6 in outside of the 12 in walkway width.    Gait with Horizontal Head Turns  Performs head turns smoothly with no change in gait. Deviates no more than 6 in outside 12 in walkway width    Gait with Vertical Head Turns  Performs head turns with no change in gait. Deviates no more than 6 in outside 12 in walkway width.    Gait and Pivot Turn  Pivot turns safely within 3 sec and stops quickly with no loss of balance.    Step Over Obstacle  Is able to step over 2 stacked shoe boxes taped together (9 in total height) without changing gait speed. No evidence of imbalance.    Gait with Narrow Base of Support  Ambulates 4-7 steps.    Gait with Eyes Closed  Walks 20 ft, slow  speed, abnormal gait pattern, evidence for imbalance, deviates 10-15 in outside 12 in walkway width. Requires more than 9 sec to ambulate 20 ft.    Ambulating Backwards  Walks 20 ft, uses assistive device, slower speed, mild gait deviations, deviates 6-10 in outside 12 in walkway width.    Steps  Alternating feet, no rail.    Total Score  25                  [facility] Adult PT Treatment/Exercise - 07-12-2017 1448      Knee/Hip Exercises: Aerobic   Nustep  lvl 5 x 5' (B UE/LE)          Balance Exercises - 07-12-2017 1448      OTAGO PROGRAM   Head Movements  Standing;5 reps    Neck Movements  Standing;5 reps    Back Extension  Standing;5 reps    Trunk Movements  Standing;5 reps    Ankle Movements  Standing;10 reps    Knee Extensor  10 reps    Knee Flexor  10 reps    Hip ABductor  10 reps    Ankle Plantorflexors  20 reps, support    Ankle Dorsiflexors  20 reps, support    Knee Bends  10 reps, support    Backwards Walking  Support    Walking and Turning Around  No  assistive device    Sideways Walking  No assistive device    Tandem Stance  10 seconds, support    Tandem Walk  Support    One Leg Stand  10 seconds, support    Heel Walking  Support    Toe Walk  Support    Heel Toe Walking Backward  -- Level C=Support    Sit to Stand  10 reps, no support             PT Long Term Goals - 07/12/2017 1454      PT LONG TERM GOAL #1   Title  Independent with ongoing HEP +/- fall prevention program    Status  Achieved      PT LONG TERM GOAL #2   Title  FGA >/= 25/30 to reduce risk for falls    Status  Achieved            Plan - 2017-07-12 1454    Clinical Impression Statement  Amber Barnett has only been able to attend limited number of therapy sessions due to illness, but reports good effort with HEP. Pt noting she feels stronger in her LEs. She has demonstrated good progress with PT with significant improvement in walking speed and balance as evidenced by increased score on FGA to 25/30. [place] fall prevention program provided to pt with pt able to demonstrate all exercises appropriately. Goals currently met and pt feels comfortable transitioning to HEP. Will place pt on hold for 30 days in the event that issues arise with HEP.    Rehab Potential  Good    PT Treatment/Interventions  Patient/family education;Neuromuscular re-education;Balance training;Therapeutic exercise;Functional mobility training;Gait training;Stair training;ADLs/Self Care Home Management    PT Next Visit Plan  30 day hold    Consulted and Agree with Plan of Care  Patient       Patient will benefit from skilled therapeutic intervention in order to improve the following deficits and impairments:  Decreased balance, Decreased coordination, Difficulty walking, Decreased activity tolerance, Decreased safety awareness  Visit Diagnosis: Unsteadiness on feet  Unspecified lack of coordination   G-Codes - 07-12-17 1537  Functional Assessment Tool Used (Outpatient Only)  FGA = 25/30  (16% limitation)    Functional Limitation  Mobility: Walking and moving around    Mobility: Walking and Moving Around Goal Status [phone])  At least 20 percent but less than 40 percent impaired, limited or restricted    Mobility: Walking and Moving Around Discharge Status [phone])  At least 1 percent but less than 20 percent impaired, limited or restricted       Problem List Patient Active Problem List   Diagnosis Date Noted  . Current use of long term anticoagulation 05/21/2017  . Cerebral embolism with cerebral infarction ([place]) 05/18/2017  . Essential hypertension 05/08/2017  . Permanent atrial fibrillation ([place]) 05/08/2017    Percival Spanish, PT, MPT 06/28/2017, 4:07 PM  [facility] [address]  [place], [place], [address] Phone: [phone]   Fax:  [phone]  Name: Amber Barnett MRN: [id number] Date of Birth: 06-Aug-1934  PHYSICAL THERAPY DISCHARGE SUMMARY  Visits from Start of Care: 5   Current functional level related to goals / functional outcomes:   Refer to above clinical impression. Pt has not needed to return to PT in >30 days, therefore will proceed with discharge from PT for this episode.   Remaining deficits:   As above.   Education / Equipment:   [place] Prevention Program  Plan: Patient agrees to discharge.  Patient goals were met. Patient is being discharged due to meeting the stated rehab goals.  ?????    Percival Spanish, PT, MPT 07/30/17, 11:40 AM  [facility] [address]  [place], [place], [address] Phone: [phone]   Fax:  [phone]

## 2017-07-02 ENCOUNTER — Ambulatory Visit (HOSPITAL_COMMUNITY): Payer: Medicare HMO

## 2017-07-02 ENCOUNTER — Telehealth (HOSPITAL_COMMUNITY): Payer: Self-pay

## 2017-07-10 ENCOUNTER — Ambulatory Visit: Payer: Self-pay | Admitting: Neurology

## 2017-07-19 ENCOUNTER — Ambulatory Visit (HOSPITAL_COMMUNITY)
Admission: RE | Admit: 2017-07-19 | Discharge: 2017-07-19 | Disposition: A | Discharge status: Home / Self Care | Payer: Medicare HMO | Source: Ambulatory Visit | Attending: Interventional Radiology | Admitting: Interventional Radiology

## 2017-07-19 DIAGNOSIS — I639 Cerebral infarction, unspecified: Secondary | ICD-10-CM

## 2017-07-19 HISTORY — PX: IR RADIOLOGIST EVAL & MGMT: IMG5224

## 2017-07-20 ENCOUNTER — Encounter (HOSPITAL_COMMUNITY): Payer: Self-pay | Admitting: Interventional Radiology

## 2017-07-23 ENCOUNTER — Encounter: Payer: Self-pay | Admitting: Neurology

## 2017-07-23 ENCOUNTER — Ambulatory Visit: Payer: Medicare HMO | Admitting: Neurology

## 2017-07-23 VITALS — BP 127/73 | HR 60 | Ht 66.0 in | Wt 150.0 lb

## 2017-07-23 DIAGNOSIS — I63312 Cerebral infarction due to thrombosis of left middle cerebral artery: Secondary | ICD-10-CM | POA: Diagnosis not present

## 2017-07-23 NOTE — Patient Instructions (Signed)
I had a long d/w patient and her daughter about her recent embolic stroke,atrial fibrillation risk for recurrent stroke/TIAs, personally independently reviewed imaging studies and stroke evaluation results and answered questions.Continue Xarelto (rivaroxaban) daily  for secondary stroke prevention and maintain strict control of hypertension with blood pressure goal below 130/90, diabetes with hemoglobin A1c goal below 6.5% and lipids with LDL cholesterol goal below 70 mg/dL. I also advised the patient to eat a healthy diet with plenty of whole grains, cereals, fruits and vegetables, exercise regularly and maintain ideal body weight .patient may also consider possible participation in the [place] stroke prevention prior Followup in the future with me in  6 months or call earlier if necessary  str prevStroke Prevention Some medical conditions and behaviors are associated with an increased chance of having a stroke. You may prevent a stroke by making healthy choices and managing medical conditions. How can I reduce my risk of having a stroke?  Stay physically active. Get at least 30 minutes of activity on most or all days.  Do not smoke. It may also be helpful to avoid exposure to secondhand smoke.  Limit alcohol use. Moderate alcohol use is considered to be: ? No more than 2 drinks per day for men. ? No more than 1 drink per day for nonpregnant women.  Eat healthy foods. This involves: ? Eating 5 or more servings of fruits and vegetables a day. ? Making dietary changes that address high blood pressure (hypertension), high cholesterol, diabetes, or obesity.  Manage your cholesterol levels. ? Making food choices that are high in fiber and low in saturated fat, trans fat, and cholesterol may control cholesterol levels. ? Take any prescribed medicines to control cholesterol as directed by your health care provider.  Manage your diabetes. ? Controlling your carbohydrate and sugar intake is  recommended to manage diabetes. ? Take any prescribed medicines to control diabetes as directed by your health care provider.  Control your hypertension. ? Making food choices that are low in salt (sodium), saturated fat, trans fat, and cholesterol is recommended to manage hypertension. ? Ask your health care provider if you need treatment to lower your blood pressure. Take any prescribed medicines to control hypertension as directed by your health care provider. ? If you are 55-42 years of age, have your blood pressure checked every 3-5 years. If you are 50 years of age or older, have your blood pressure checked every year.  Maintain a healthy weight. ? Reducing calorie intake and making food choices that are low in sodium, saturated fat, trans fat, and cholesterol are recommended to manage weight.  Stop drug abuse.  Avoid taking birth control pills. ? Talk to your health care provider about the risks of taking birth control pills if you are over 76 years old, smoke, get migraines, or have ever had a blood clot.  Get evaluated for sleep disorders (sleep apnea). ? Talk to your health care provider about getting a sleep evaluation if you snore a lot or have excessive sleepiness.  Take medicines only as directed by your health care provider. ? For some people, aspirin or blood thinners (anticoagulants) are helpful in reducing the risk of forming abnormal blood clots that can lead to stroke. If you have the irregular heart rhythm of atrial fibrillation, you should be on a blood thinner unless there is a good reason you cannot take them. ? Understand all your medicine instructions.  Make sure that other conditions (such as anemia or atherosclerosis) are addressed.  Get help right away if:  You have sudden weakness or numbness of the face, arm, or leg, especially on one side of the body.  Your face or eyelid droops to one side.  You have sudden confusion.  You have trouble speaking  (aphasia) or understanding.  You have sudden trouble seeing in one or both eyes.  You have sudden trouble walking.  You have dizziness.  You have a loss of balance or coordination.  You have a sudden, severe headache with no known cause.  You have new chest pain or an irregular heartbeat. Any of these symptoms may represent a serious problem that is an emergency. Do not wait to see if the symptoms will go away. Get medical help at once. Call your local emergency services (911 in [place] Do not drive yourself to the hospital. This information is not intended to replace advice given to you by your health care provider. Make sure you discuss any questions you have with your health care provider. Document Released: 09/14/2004 Document Revised: 01/13/2016 Document Reviewed: 02/07/2013 Elsevier Interactive Patient Education  2017 [employer].

## 2017-07-23 NOTE — Progress Notes (Signed)
[facility] [address] [place]. [place] [address] [phone]       OFFICE FOLLOW-UP NOTE  [name] Date of Birth:  [birth year] Medical Record Number:  [id number]   [facility] Amber Barnett is a pleasant 83 year old African-American lady seen today for the first office follow-up visit following hospital admission for stroke in September 2018. She is accompanied by her daughter. History is obtained from them and review of electronic medical records. I have personally reviewed imaging films.Amber Barnett presented with left sized gaze deviation, right hemiparesis, and aphasia witness by family. The patient admitted to not being compliant with taking the second dose of eliquis every day and consistently. On presentation head CT indicated distal M1 occlusive thrombus. CTA was performed and subsequent successful revascularization by Dr. Estanislado Pandy. Subsequent MRI head showed punctate infarct without significant residual stenosis. She had no significant resultant deficits and was discharged to home with her niece for supervision and outpatient PT recommended. On review of risk factors she was noted to report missing doses of Eliquis intermittently, including right before this admission. She remained in Afib throughout her evaluation. Eliquis was discontinued and Xarelto prescribed at discharge for hopefully easier treatment adherence. Patient was counseled to be compliant with Xarelto and to take it with the proper meal consistently at the same time every day. She voiced understanding. She states she has done very well since discharge. She has no residual neurological deficits. She is tolerating Xarelto well and consistently takes it with lunch and a proper meal. She did have minor nasal bleeding a few weeks ago following a cold but that seemed to have stopped. Her blood pressure is well controlled and today it is 127/73. She does not have a any specific neurological complaints  today. She's had no recurrent stroke or TIA symptoms. She is completely independent of all active disease of daily living.    ROS:   14 system review of systems is positive for  eye itching, runny nose, cough, wheezing, choking, leg swelling, frequency of urination, muscle cramps, rash, itching, dizziness, anxiety and nervousness and all other systems negative PMH:  Past Medical History:  Diagnosis Date  . Atrial fibrillation ([place])   . DVT (deep venous thrombosis) ([place])   . History of left breast cancer   . Hypertension   . Stroke [facility])     Social History:  Social History   Socioeconomic History  . Marital status: Widowed    Spouse name: Not on file  . Number of children: Not on file  . Years of education: Not on file  . Highest education level: Not on file  Social Needs  . Financial resource strain: Not on file  . Food insecurity - worry: Not on file  . Food insecurity - inability: Not on file  . Transportation needs - medical: Not on file  . Transportation needs - non-medical: Not on file  Occupational History  . Not on file  Tobacco Use  . Smoking status: Former Smoker    Types: Cigarettes  . Smokeless tobacco: Never Used  Substance and Sexual Activity  . Alcohol use: No  . Drug use: Not on file  . Sexual activity: Not on file  Other Topics Concern  . Not on file  Social History Narrative  . Not on file    Medications:   Current Outpatient Medications on File Prior to Visit  Medication Sig Dispense Refill  . amLODipine-valsartan (EXFORGE) 5-320 MG tablet Take 1 tablet by mouth daily.    Marland Kitchen  apixaban (ELIQUIS) 2.5 MG TABS tablet Take 2.5 mg by mouth.    . Cholecalciferol (VITAMIN D3) 2000 units capsule Take 2,000 Units by mouth daily.    Marland Kitchen diltiazem (TIAZAC) 120 MG 24 hr capsule Take 120 mg by mouth daily.  6  . diphenhydrAMINE (BENADRYL) 25 mg capsule Take 25 mg by mouth daily as needed for itching (allergic reaction).     Marland Kitchen latanoprost (XALATAN) 0.005 %  ophthalmic solution Place 1 drop into both eyes at bedtime.    Marland Kitchen LORazepam (ATIVAN) 1 MG tablet Take 1 mg by mouth every 8 (eight) hours as needed for anxiety.     . Magnesium 250 MG TABS Take 250 mg by mouth daily.    . pantoprazole (PROTONIX) 40 MG tablet Take 40 mg by mouth daily.  2  . Potassium 99 MG TABS Take 99 mg by mouth daily.    . rivaroxaban (XARELTO) 20 MG TABS tablet Take 1 tablet (20 mg total) by mouth daily with supper. 30 tablet 2  . LORazepam (ATIVAN) 1 MG tablet Take 1 mg by mouth.     No current facility-administered medications on file prior to visit.     Allergies:   Allergies  Allergen Reactions  . Fish-Derived Products Swelling    Facial swelling then tongue swelling  . Lactuca Virosa Diarrhea  . Milk-Related Compounds Diarrhea    WHOLE MILK  . Other Swelling    Allergy to all nuts - facial swelling, then tongue swelling  . Amoxicillin Nausea And Vomiting    Physical Exam General: well developed, well nourished pleasant elderly African-American lady, seated, in no evident distress Head: head normocephalic and atraumatic.  Neck: supple with no carotid or supraclavicular bruits Cardiovascular: regular rate and rhythm, no murmurs Musculoskeletal: no deformity Skin:  no rash/petichiae Vascular:  Normal pulses all extremities Vitals:   07/23/17 1402  BP: 127/73  Pulse: 60   Neurologic Exam Mental Status: Awake and fully alert. Oriented to place and time. Recent and remote memory intact. Attention span, concentration and fund of knowledge appropriate. Mood and affect appropriate.  Cranial Nerves: Fundoscopic exam reveals sharp disc margins. Pupils equal, briskly reactive to light. Extraocular movements full without nystagmus. Visual fields full to confrontation. Hearing intact. Facial sensation intact. Face, tongue, palate moves normally and symmetrically.  Motor: Normal bulk and tone. Normal strength in all tested extremity muscles. Diminished fine finger  movements on the right. Orbits left over right upper extremity. Sensory.: intact to touch ,pinprick .position and vibratory sensation.  Coordination: Rapid alternating movements normal in all extremities. Finger-to-nose and heel-to-shin performed accurately bilaterally. Gait and Station: Arises from chair without difficulty. Stance is normal. Gait demonstrates normal stride length and balance . Able to heel, toe and tandem walk with mild difficulty.  Reflexes: 1+ and symmetric. Toes downgoing.   NIHSS  0 Modified Rankin  0   ASSESSMENT: 83 year old lady with embolic left MCA infarct and September 2018 secondary to left middle cerebral artery occlusion due to atrial fibrillation treated with mechanical embolectomy with complete recanalization and excellent clinical recovery.    PLAN: I had a long d/w patient and her daughter about her recent embolic stroke,atrial fibrillation risk for recurrent stroke/TIAs, personally independently reviewed imaging studies and stroke evaluation results and answered questions.Continue Xarelto (rivaroxaban) daily  for secondary stroke prevention and maintain strict control of hypertension with blood pressure goal below 130/90, diabetes with hemoglobin A1c goal below 6.5% and lipids with LDL cholesterol goal below 70 mg/dL. I also advised  the patient to eat a healthy diet with plenty of whole grains, cereals, fruits and vegetables, exercise regularly and maintain ideal body weight .patient may also consider possible participation in the [place] stroke prevention prior Followup in the future with me in  6 months or call earlier if necessary Greater than 50% of time during this 25 minute visit was spent on counseling,explanation of diagnosis, planning of further management, discussion with patient and family and coordination of care  Note: This document was prepared with digital dictation and possible smart phrase technology. Any transcriptional errors that result from  this process are unintentional

## 2017-07-25 ENCOUNTER — Other Ambulatory Visit: Payer: Self-pay | Admitting: Internal Medicine

## 2017-08-30 ENCOUNTER — Other Ambulatory Visit: Payer: Self-pay

## 2017-08-30 NOTE — Patient Outreach (Signed)
Telephone outreach to patient to obtain mRS was successfully completed. mRS = 1 

## 2017-10-22 ENCOUNTER — Telehealth: Payer: Self-pay | Admitting: Neurology

## 2017-10-22 NOTE — Telephone Encounter (Signed)
Pt niece is calling(on DPR) she is asking for a call to discuss pt having blood in her stool as a result of the rivaroxaban (XARELTO) 20 MG TABS tablet.  Pt niece wants to know what Dr Leonie Man thinks about pt cutting the rivaroxaban (XARELTO) 20 MG TABS tablet in half.  Please call

## 2017-10-22 NOTE — Telephone Encounter (Signed)
Kindly inform her that cutting the dose of Xarelto in half will not protect her from preventing strokes. She needs to take the full dose and in case the bleeding in the stools recurs then she needs to stop the medicine and see a gastroenterologist to find the source of bleeding.

## 2017-10-22 NOTE — Telephone Encounter (Signed)
Rn call patients niece Baker,[place] about her aunt having blood in the stool; The niece stated; Pt had blood in stool on 10/04/2017. Pt saw PCP on 10/05/2017 for blood in stool. Also on 10/05/2017 pts PCP cut the Xarelto in half to 10mg . The pts blood in stool has cleared up.But her blood count is low, and she is on a iron pill. PT saw PCP on 10/11/2017,and has appt on 10/22/2017 for a follow up. RN stated the message states they want Dr. Leonie Man to cut it in half. The niece stated the caller misunderstood her. The pts PCP cut the pill in half. She did call the cardiology office and Dr. Chapman Fitch stated he did not know why the PCP cut it in half but he will let PCP manage that. The niece stated she wanted to get Dr. Leonie Man advice on patient taking 10mg  of xarelto instead of 20mg . She wants to know with the decrease in dosage would that help her to avoid having another stroke. Rn stated a message will be sent to Dr. Leonie Man. PTs niece verbalized understanding.

## 2017-10-23 NOTE — Telephone Encounter (Signed)
Rn call patient niece that per Dr. Leonie Man the pt needs to be back on the whole dosage of xarelto 20mg  to prevent strokes. IF patients bleeding recurs the PCP needs to refer pt to a GI doctor to find the source of the bleeding. The niece stated if the PCP refused can Dr. Leonie Man do the referral. Rn stated the PCP has to do the referral if pt is anemic, or having any bleeding issues. Rn stated this is not due a stroke,and she will have to speak with the PCP and discuss it with him or her. The niece stated the pt will be seeing her PCP next week and she will discuss it with the MD. She verbalized understanding.

## 2017-12-05 ENCOUNTER — Ambulatory Visit: Payer: Self-pay | Admitting: Psychiatry

## 2018-01-21 ENCOUNTER — Ambulatory Visit: Payer: Medicare HMO | Admitting: Neurology

## 2018-01-21 ENCOUNTER — Encounter: Payer: Self-pay | Admitting: Neurology

## 2018-01-21 VITALS — BP 136/79 | HR 88 | Ht 66.0 in | Wt 150.4 lb

## 2018-01-21 DIAGNOSIS — I6990 Unspecified sequelae of unspecified cerebrovascular disease: Secondary | ICD-10-CM | POA: Diagnosis not present

## 2018-01-21 NOTE — Patient Instructions (Signed)
I had a long d/w patient and her daughter about her recent embolic stroke,atrial fibrillation risk for recurrent stroke/TIAs, personally independently reviewed imaging studies and stroke evaluation results and answered questions.Continue Xarelto (rivaroxaban) daily  for secondary stroke prevention and maintain strict control of hypertension with blood pressure goal below 130/90, diabetes with hemoglobin A1c goal below 6.5% and lipids with LDL cholesterol goal below 70 mg/dL. I also advised the patient to eat a healthy diet with plenty of whole grains, cereals, fruits and vegetables, exercise regularly and maintain ideal body weight . Followup in the future with my nurse practitioner Janett Billow in 1 year or call earlier if necessary

## 2018-01-21 NOTE — Progress Notes (Signed)
[facility] [address] [place]. [place] [address] [phone]       OFFICE FOLLOW-UP NOTE  [name] Date of Birth:  [birth year] Medical Record Number:  [id number]   HP Last visit 07/23/2017 ; [name] is a pleasant 84 year old African-American lady seen today for the first office follow-up visit following hospital admission for stroke in September 2018. She is accompanied by her daughter. History is obtained from them and review of electronic medical records. I have personally reviewed imaging films.[name] presented with left sized gaze deviation, right hemiparesis, and aphasia witness by family. The patient admitted to not being compliant with taking the second dose of eliquis every day and consistently. On presentation head CT indicated distal M1 occlusive thrombus. CTA was performed and subsequent successful revascularization by Dr. Estanislado Pandy. Subsequent MRI head showed punctate infarct without significant residual stenosis. She had no significant resultant deficits and was discharged to home with her niece for supervision and outpatient PT recommended. On review of risk factors she was noted to report missing doses of Eliquis intermittently, including right before this admission. She remained in Afib throughout her evaluation. Eliquis was discontinued and Xarelto prescribed at discharge for hopefully easier treatment adherence. Patient was counseled to be compliant with Xarelto and to take it with the proper meal consistently at the same time every day. She voiced understanding. She states she has done very well since discharge. She has no residual neurological deficits. She is tolerating Xarelto well and consistently takes it with lunch and a proper meal. She did have minor nasal bleeding a few weeks ago following a cold but that seemed to have stopped. Her blood pressure is well controlled and today it is 127/73. She does not have a any specific  neurological complaints today. She's had no recurrent stroke or TIA symptoms. She is completely independent of all active disease of daily living.  Update 01/21/2018 : She returns for f/u after last visit 6 months ago. She is accompanied by her daughter. She continues to do well without any recurrent stroke or TIA symptoms. She did have some mild rectal bleeding on xarelto 20 mg daily which has stopped after her primary MD decreased the dose to 15 mg daily which she appears to be tolerating well. Her BP is well controled and is 136/79 today.  She denies any recurrent stroke or TIA symptoms.  In fact she has no new complaints today..  She continues to have swelling in her leg she saw she saw her cardiologist recently who increased the dose of amlodipine as she was found to have pulmonary hypertension..  ROS:   14 system review of systems is positive for  Itching, leg swelling,  , anxiety and nervousness and all other systems negative PMH:  Past Medical History:  Diagnosis Date  . Atrial fibrillation ([place])   . DVT (deep venous thrombosis) ([place])   . History of left breast cancer   . Hypertension   . Stroke [facility])     Social History:  Social History   Socioeconomic History  . Marital status: Widowed    Spouse name: Not on file  . Number of children: Not on file  . Years of education: Not on file  . Highest education level: Not on file  Occupational History  . Not on file  Social Needs  . Financial resource strain: Not on file  . Food insecurity:    Worry: Not on file    Inability: Not on file  .  Transportation needs:    Medical: Not on file    Non-medical: Not on file  Tobacco Use  . Smoking status: Former Smoker    Types: Cigarettes  . Smokeless tobacco: Never Used  Substance and Sexual Activity  . Alcohol use: No  . Drug use: Not on file  . Sexual activity: Not on file  Lifestyle  . Physical activity:    Days per week: Not on file    Minutes per session: Not on file  .  Stress: Not on file  Relationships  . Social connections:    Talks on phone: Not on file    Gets together: Not on file    Attends religious service: Not on file    Active member of club or organization: Not on file    Attends meetings of clubs or organizations: Not on file    Relationship status: Not on file  . Intimate partner violence:    Fear of current or ex partner: Not on file    Emotionally abused: Not on file    Physically abused: Not on file    Forced sexual activity: Not on file  Other Topics Concern  . Not on file  Social History Narrative  . Not on file    Medications:   Current Outpatient Medications on File Prior to Visit  Medication Sig Dispense Refill  . amLODipine-valsartan (EXFORGE) 5-320 MG tablet Take 1 tablet by mouth daily.    . Cholecalciferol (VITAMIN D3) 2000 units capsule Take 2,000 Units by mouth daily.    Marland Kitchen diltiazem (TIAZAC) 120 MG 24 hr capsule Take 120 mg by mouth daily.  6  . diphenhydrAMINE (BENADRYL) 25 mg capsule Take 25 mg by mouth daily as needed for itching (allergic reaction).     Marland Kitchen latanoprost (XALATAN) 0.005 % ophthalmic solution Place 1 drop into both eyes at bedtime.    Marland Kitchen LORazepam (ATIVAN) 1 MG tablet Take 1 mg by mouth every 8 (eight) hours as needed for anxiety.     Marland Kitchen LORazepam (ATIVAN) 1 MG tablet Take 1 mg by mouth.    . Magnesium 250 MG TABS Take 250 mg by mouth daily.    . pantoprazole (PROTONIX) 40 MG tablet Take 40 mg by mouth daily.  2  . Potassium 99 MG TABS Take 99 mg by mouth daily.    . Rivaroxaban (XARELTO) 15 MG TABS tablet Take 15 mg by mouth daily with supper.     No current facility-administered medications on file prior to visit.     Allergies:   Allergies  Allergen Reactions  . Fish-Derived Products Swelling    Facial swelling then tongue swelling  . Lactuca Virosa Diarrhea  . Milk-Related Compounds Diarrhea    WHOLE MILK  . Other Swelling    Allergy to all nuts - facial swelling, then tongue swelling  .  Amoxicillin Nausea And Vomiting    Physical Exam General: well developed, well nourished pleasant elderly African-American lady, seated, in no evident distress Head: head normocephalic and atraumatic.  Neck: supple with no carotid or supraclavicular bruits Cardiovascular: regular rate and rhythm, no murmurs Musculoskeletal: no deformity Skin:  no rash/petichiae Vascular:  Normal pulses all extremities Vitals:   01/21/18 1128  BP: 136/79  Pulse: 88   Neurologic Exam Mental Status: Awake and fully alert. Oriented to place and time. Recent and remote memory intact. Attention span, concentration and fund of knowledge appropriate. Mood and affect appropriate.  Cranial Nerves: Fundoscopic exam not done Pupils equal, briskly  reactive to light. Extraocular movements full without nystagmus. Visual fields full to confrontation. Hearing intact. Facial sensation intact. Face, tongue, palate moves normally and symmetrically.  Motor: Normal bulk and tone. Normal strength in all tested extremity muscles. Diminished fine finger movements on the right. Orbits left over right upper extremity. Sensory.: intact to touch ,pinprick .position and vibratory sensation.  Coordination: Rapid alternating movements normal in all extremities. Finger-to-nose and heel-to-shin performed accurately bilaterally. Gait and Station: Arises from chair without difficulty. Stance is normal. Gait demonstrates normal stride length and balance . Able to heel, toe and tandem walk with mild difficulty.  Reflexes: 1+ and symmetric. Toes downgoing.   NIHSS  0 Modified Rankin  0   ASSESSMENT: 84 year old lady with embolic left MCA infarct and September 2018 secondary to left middle cerebral artery occlusion due to atrial fibrillation treated with mechanical embolectomy with complete recanalization and excellent clinical recovery.    PLAN: I had a long d/w patient and her daughter about her recent embolic stroke,atrial  fibrillation risk for recurrent stroke/TIAs, personally independently reviewed imaging studies and stroke evaluation results and answered questions.Continue Xarelto (rivaroxaban) daily  for secondary stroke prevention and maintain strict control of hypertension with blood pressure goal below 130/90, diabetes with hemoglobin A1c goal below 6.5% and lipids with LDL cholesterol goal below 70 mg/dL. I also advised the patient to eat a healthy diet with plenty of whole grains, cereals, fruits and vegetables, exercise regularly and maintain ideal body weight . Followup in the future with my nurse practitioner Janett Billow in 1 year or call earlier if necessary Greater than 50% of time during this 25 minute visit was spent on counseling,explanation of diagnosis, planning of further management, discussion with patient and family and coordination of care Antony Contras, MD Note: This document was prepared with digital dictation and possible smart phrase technology. Any transcriptional errors that result from this process are unintentional

## 2019-01-21 ENCOUNTER — Telehealth: Payer: Self-pay

## 2019-01-21 NOTE — Telephone Encounter (Signed)
Pts niece is calling in stating she isnt capable to do a video visit , she states pt can speak on the phone but she doing fairly well her meds are under control, she has respiratory issues but she is seeing a cardiologist , she states she feels as if appt can be pushed back.

## 2019-01-21 NOTE — Telephone Encounter (Signed)
Left vm for pts niece [place] that pt has a yearly appt schedule on January 23, 2019 at 115pm. I stated its due to [place] pandemic.I left number for niece to call back. Need verbal consent to file insurance and do video and to send link to. A cell phone or lap top, ipap can work for visit.

## 2019-01-23 ENCOUNTER — Ambulatory Visit: Payer: Medicare HMO | Admitting: Adult Health

## 2019-03-06 ENCOUNTER — Ambulatory Visit: Payer: Medicare HMO | Admitting: Adult Health

## 2019-06-05 ENCOUNTER — Ambulatory Visit: Payer: Medicare HMO | Admitting: Adult Health

## 2019-06-10 ENCOUNTER — Encounter: Payer: Self-pay | Admitting: Neurology

## 2019-06-10 ENCOUNTER — Ambulatory Visit: Payer: Medicare HMO | Admitting: Neurology

## 2019-06-10 ENCOUNTER — Other Ambulatory Visit: Payer: Self-pay

## 2019-06-10 VITALS — BP 148/66 | HR 80 | Temp 97.6°F | Ht 65.0 in | Wt 155.4 lb

## 2019-06-10 DIAGNOSIS — I6990 Unspecified sequelae of unspecified cerebrovascular disease: Secondary | ICD-10-CM | POA: Diagnosis not present

## 2019-06-10 NOTE — Progress Notes (Signed)
[facility] [address] [place]. [place] [address] [phone]       OFFICE FOLLOW-UP NOTE  [name] Date of Birth:  01-13-1934 Medical Record Number:  [id number]   HP Last visit 07/23/2017 ; Amber Barnett is a pleasant 85 year old African-American lady seen today for the first office follow-up visit following hospital admission for stroke in September 2018. She is accompanied by her daughter. History is obtained from them and review of electronic medical records. I have personally reviewed imaging films.Amber Barnett presented with left sized gaze deviation, right hemiparesis, and aphasia witness by family. The patient admitted to not being compliant with taking the second dose of eliquis every day and consistently. On presentation head CT indicated distal M1 occlusive thrombus. CTA was performed and subsequent successful revascularization by Dr. Estanislado Pandy. Subsequent MRI head showed punctate infarct without significant residual stenosis. She had no significant resultant deficits and was discharged to home with her niece for supervision and outpatient PT recommended. On review of risk factors she was noted to report missing doses of Eliquis intermittently, including right before this admission. She remained in Afib throughout her evaluation. Eliquis was discontinued and Xarelto prescribed at discharge for hopefully easier treatment adherence. Patient was counseled to be compliant with Xarelto and to take it with the proper meal consistently at the same time every day. She voiced understanding. She states she has done very well since discharge. She has no residual neurological deficits. She is tolerating Xarelto well and consistently takes it with lunch and a proper meal. She did have minor nasal bleeding a few weeks ago following a cold but that seemed to have stopped. Her blood pressure is well controlled and today it is 127/73. She does not have a any specific  neurological complaints today. She's had no recurrent stroke or TIA symptoms. She is completely independent of all active disease of daily living.  Update 01/21/2018 : She returns for f/u after last visit 6 months ago. She is accompanied by her daughter. She continues to do well without any recurrent stroke or TIA symptoms. She did have some mild rectal bleeding on xarelto 20 mg daily which has stopped after her primary MD decreased the dose to 15 mg daily which she appears to be tolerating well. Her BP is well controled and is 136/79 today.  She denies any recurrent stroke or TIA symptoms.  In fact she has no new complaints today..  She continues to have swelling in her leg she saw she saw her cardiologist recently who increased the dose of amlodipine as she was found to have pulmonary hypertension.Marland Kitchen Update 06/10/2019 : She returns for follow-up after last visit more than a year ago.  She is accompanied by her niece.  Patient continues to do well she has had no recurrent stroke or TIA symptoms.  She remains on Xarelto which is tolerating well without bleeding or bruising.  Her blood pressure is usually well controlled today it is borderline at 148/66 in office.  She states that in the last couple of months she is noted some intermittent swallowing difficulties.  She often coughs out some phlegm.  She feels sometimes she is choking on her saliva.  She denies any pain.  Her appetite is good.  She denies weight loss.  She has not discussed this complaints with the primary care physician but plans to do so soon.  She is planning on having dental extraction and has questions about holding Xarelto in the stroke risk in the  periprocedure well.  She has no other new neurological complaints. ROS:   14 system review of systems is positive for swallowing difficulty, choking, coughing, anxiety and nervousness and all other systems negative PMH:  Past Medical History:  Diagnosis Date  . Atrial fibrillation ([place])   . DVT  (deep venous thrombosis) ([place])   . History of left breast cancer   . Hypertension   . Stroke [facility])     Social History:  Social History   Socioeconomic History  . Marital status: Widowed    Spouse name: Not on file  . Number of children: Not on file  . Years of education: Not on file  . Highest education level: Not on file  Occupational History  . Not on file  Social Needs  . Financial resource strain: Not on file  . Food insecurity    Worry: Not on file    Inability: Not on file  . Transportation needs    Medical: Not on file    Non-medical: Not on file  Tobacco Use  . Smoking status: Former Smoker    Types: Cigarettes  . Smokeless tobacco: Never Used  Substance and Sexual Activity  . Alcohol use: No  . Drug use: Not on file  . Sexual activity: Not on file  Lifestyle  . Physical activity    Days per week: Not on file    Minutes per session: Not on file  . Stress: Not on file  Relationships  . Social Herbalist on phone: Not on file    Gets together: Not on file    Attends religious service: Not on file    Active member of club or organization: Not on file    Attends meetings of clubs or organizations: Not on file    Relationship status: Not on file  . Intimate partner violence    Fear of current or ex partner: Not on file    Emotionally abused: Not on file    Physically abused: Not on file    Forced sexual activity: Not on file  Other Topics Concern  . Not on file  Social History Narrative  . Not on file    Medications:   Current Outpatient Medications on File Prior to Visit  Medication Sig Dispense Refill  . amLODipine-valsartan (EXFORGE) 5-320 MG tablet Take 1 tablet by mouth daily.    . Cholecalciferol (VITAMIN D3) 2000 units capsule Take 2,000 Units by mouth daily.    Marland Kitchen diltiazem (TIAZAC) 120 MG 24 hr capsule Take 120 mg by mouth daily.  6  . diphenhydrAMINE (BENADRYL) 25 mg capsule Take 25 mg by mouth daily as needed for itching (allergic  reaction).     Marland Kitchen latanoprost (XALATAN) 0.005 % ophthalmic solution Place 1 drop into both eyes at bedtime.    Marland Kitchen LORazepam (ATIVAN) 1 MG tablet Take 1 mg by mouth every 8 (eight) hours as needed for anxiety.     Marland Kitchen LORazepam (ATIVAN) 1 MG tablet Take 1 mg by mouth.    . Magnesium 250 MG TABS Take 250 mg by mouth daily.    . pantoprazole (PROTONIX) 40 MG tablet Take 40 mg by mouth daily.  2  . Potassium 99 MG TABS Take 99 mg by mouth daily.    . Rivaroxaban (XARELTO) 15 MG TABS tablet Take 15 mg by mouth daily with supper.     No current facility-administered medications on file prior to visit.     Allergies:   Allergies  Allergen Reactions  . Fish-Derived Products Swelling    Facial swelling then tongue swelling  . Lactuca Virosa Diarrhea  . Milk-Related Compounds Diarrhea    WHOLE MILK  . Other Swelling    Allergy to all nuts - facial swelling, then tongue swelling  . Amoxicillin Nausea And Vomiting    Physical Exam General: well developed, well nourished pleasant elderly African-American lady, seated, in no evident distress Head: head normocephalic and atraumatic.  Neck: supple with no carotid or supraclavicular bruits Cardiovascular: regular rate and rhythm, no murmurs Musculoskeletal: no deformity Skin:  no rash/petichiae Vascular:  Normal pulses all extremities Vitals:   06/10/19 1514  BP: (!) 148/66  Pulse: 80  Temp: 97.6 F (36.4 C)   Neurologic Exam Mental Status: Awake and fully alert. Oriented to place and time. Recent and remote memory intact. Attention span, concentration and fund of knowledge appropriate. Mood and affect appropriate.  Cranial Nerves: Fundoscopic exam not done Pupils equal, briskly reactive to light. Extraocular movements full without nystagmus. Visual fields full to confrontation. Hearing intact. Facial sensation intact.  Mild right nasolabial asymmetry when she smiles., tongue, palate moves normally and symmetrically.  Motor: Normal bulk and  tone. Normal strength in all tested extremity muscles. Diminished fine finger movements on the right. Orbits left over right upper extremity. Sensory.: intact to touch ,pinprick .position and vibratory sensation.  Coordination: Rapid alternating movements normal in all extremities. Finger-to-nose and heel-to-shin performed accurately bilaterally. Gait and Station: Arises from chair without difficulty. Stance is normal. Gait demonstrates normal stride length and balance . Able to heel, toe and tandem walk with mild difficulty.  Reflexes: 1+ and symmetric. Toes downgoing.   NIHSS  1 Modified Rankin  1   ASSESSMENT: 85 year old lady with embolic left MCA infarct and September 2018 secondary to left middle cerebral artery occlusion due to atrial fibrillation treated with mechanical embolectomy with complete recanalization and excellent clinical recovery.    PLAN: I had a long d/w patient and her neice about her recent embolic stroke,atrial fibrillation risk for recurrent stroke/TIAs, personally independently reviewed imaging studies and stroke evaluation results and answered questions.Continue Xarelto (rivaroxaban) daily for secondary stroke prevention and maintain strict control of hypertension with blood pressure goal below 130/90, diabetes with hemoglobin A1c goal below 6.5% and lipids with LDL cholesterol goal below 70 mg/dL. I also advised the patient to eat a healthy diet with plenty of whole grains, cereals, fruits and vegetables, exercise regularly and maintain ideal body weight .  I advised her to follow-up with her primary physician about her choking and dysphagia.  I advised she can hold Xarelto for 3 days prior to her scheduled dental extractions with a small but acceptable periprocedural risk of TIA/stroke if she is willing.  Followup in the future with my nurse practitioner Janett Billow in 1 year or call earlier if necessary. Greater than 50% of time during this 25 minute visit was spent on  counseling,explanation of diagnosis, planning of further management, discussion with patient and family and coordination of care Antony Contras, MD Note: This document was prepared with digital dictation and possible smart phrase technology. Any transcriptional errors that result from this process are unintentional

## 2019-06-10 NOTE — Patient Instructions (Signed)
I had a long d/w patient and her neice about her recent embolic stroke,atrial fibrillation risk for recurrent stroke/TIAs, personally independently reviewed imaging studies and stroke evaluation results and answered questions.Continue Xarelto (rivaroxaban) daily for secondary stroke prevention and maintain strict control of hypertension with blood pressure goal below 130/90, diabetes with hemoglobin A1c goal below 6.5% and lipids with LDL cholesterol goal below 70 mg/dL. I also advised the patient to eat a healthy diet with plenty of whole grains, cereals, fruits and vegetables, exercise regularly and maintain ideal body weight . Followup in the future with my nurse practitioner Janett Billow in 1 year or call earlier if necessary.

## 2019-07-04 IMAGING — MR MR HEAD W/O CM
9 of 11 series · 32 of 48 positions shown · non-contrast
Comparison: Head CT and CTA 05/18/2017

CLINICAL DATA: Stroke. Status post revascularization of occluded
left MCA.

EXAM:
MRI HEAD WITHOUT CONTRAST
MRA HEAD WITHOUT CONTRAST
TECHNIQUE: Multiplanar, multiecho pulse sequences of the brain and surrounding
structures were obtained without intravenous contrast. Angiographic
images of the head were obtained using MRA technique without
contrast.

[Series 7: DWI · axial · 3.0mm · 1.09mm/px · z∈[-73,+68]mm · 7 of 98 slices shown (1 of 4)]
[im 1/98]
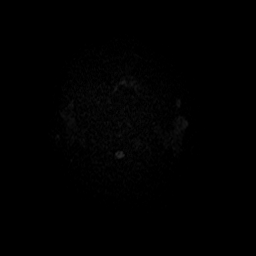
[im 17/98]
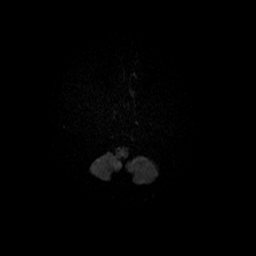
[im 33/98]
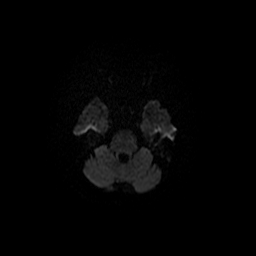
[im 49/98]
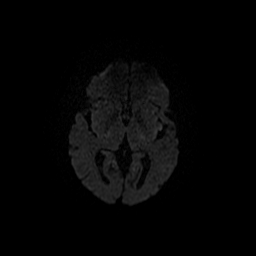
[im 65/98]
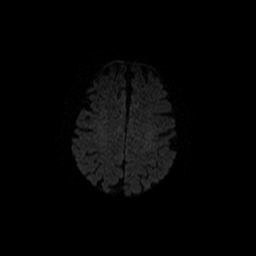
[im 81/98]
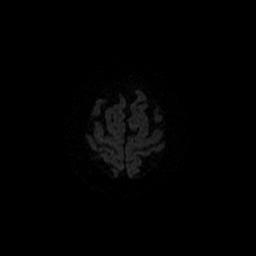
[im 98/98]
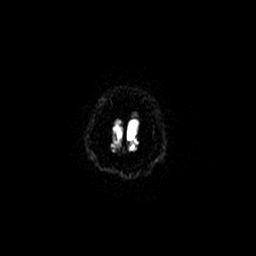

[Series 8: (id) mt fs · axial · 1.4mm · 0.43mm/px · z∈[-61,-20]mm · 4 of 136 slices shown]
[im 1/136]
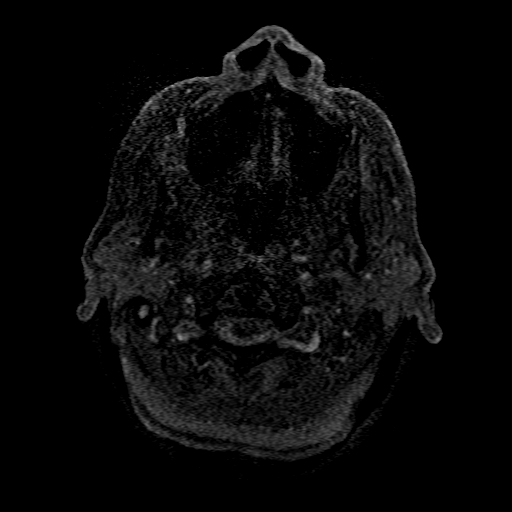
[im 16/136]
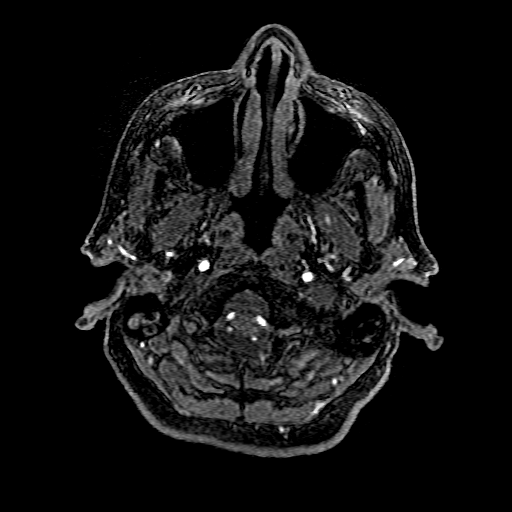
[im 46/136]
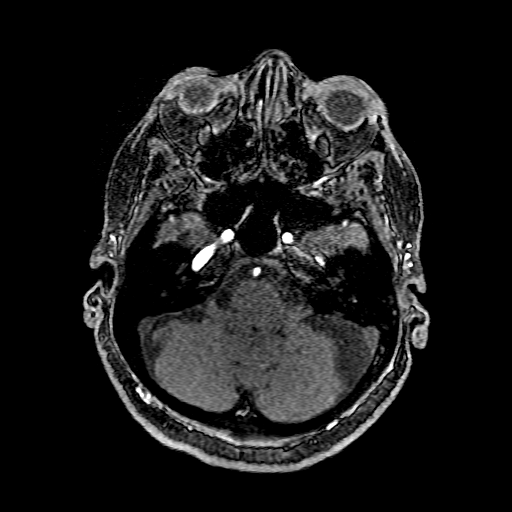
[im 61/136]
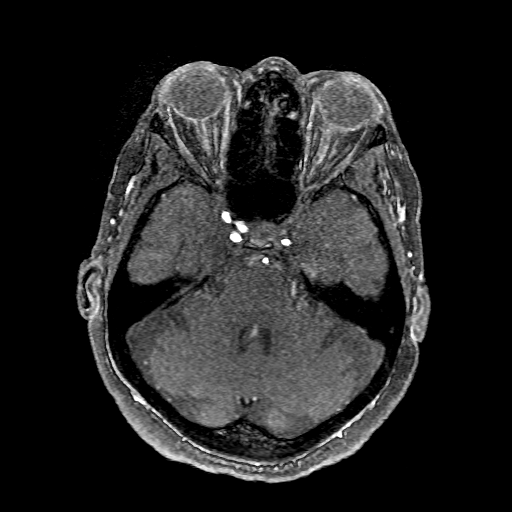

[Series 9: DWI · coronal · 5.0mm · 1.09mm/px · 6 of 82 slices shown (2 of 4)]
[im 1/82]
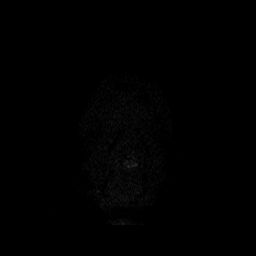
[im 17/82]
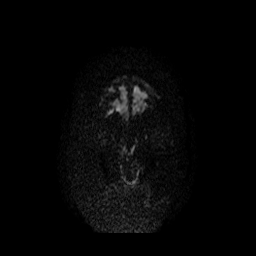
[im 33/82]
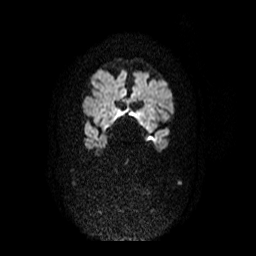
[im 49/82]
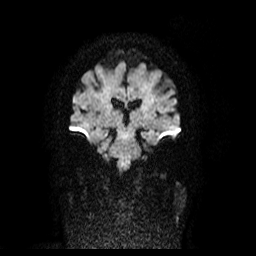
[im 65/82]
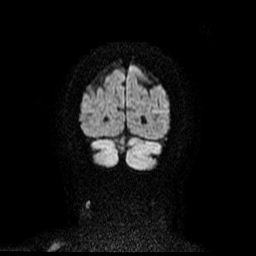
[im 82/82]
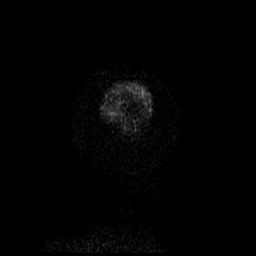

[Series 10: T1 · sagittal · 5.0mm · 0.47mm/px · 2 of 23 slices shown]
[im 1/23]
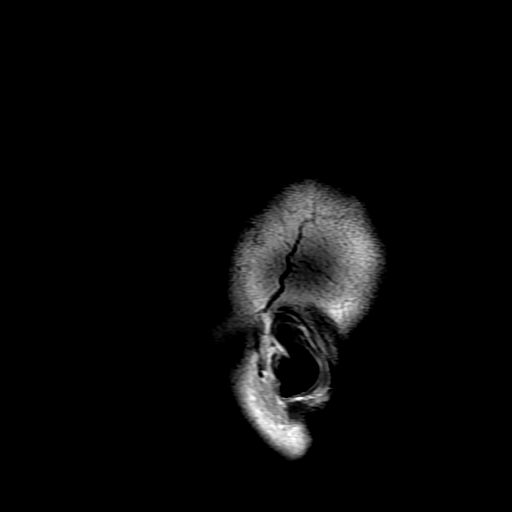
[im 23/23]
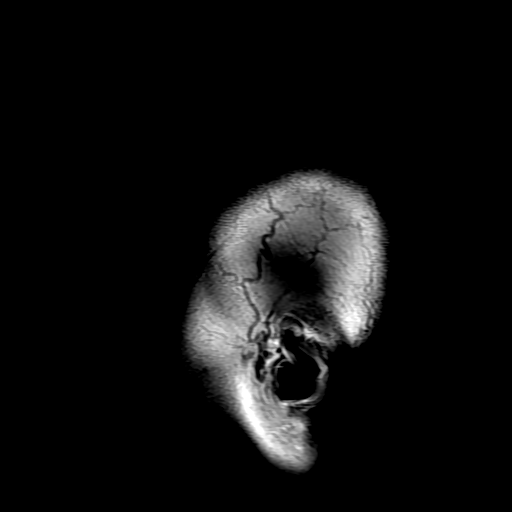

[Series 11: T2 · axial · 5.0mm · 0.43mm/px · z∈[-76,+63]mm · 2 of 25 slices shown (1 of 2)]
[im 1/25]
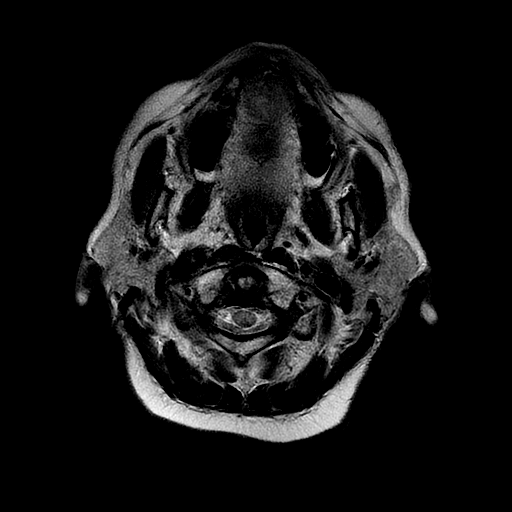
[im 25/25]
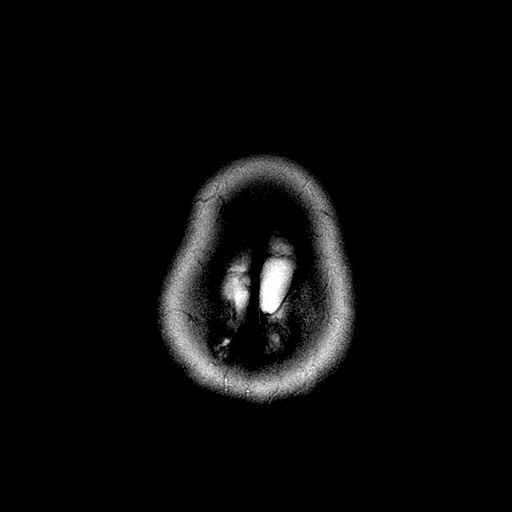

[Series 12: FLAIR · axial · 5.0mm · 0.43mm/px · z∈[-76,+63]mm · 2 of 25 slices shown]
[im 1/25]
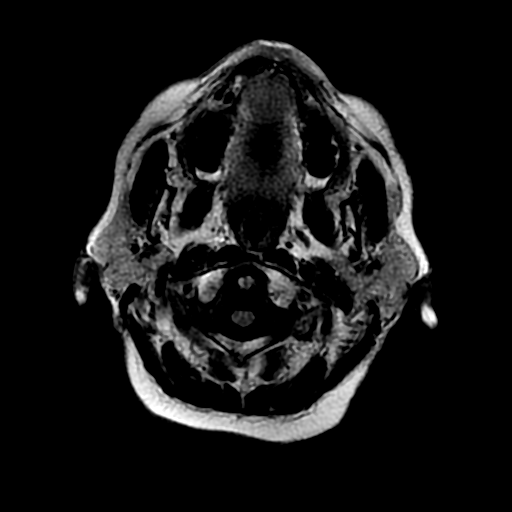
[im 25/25]
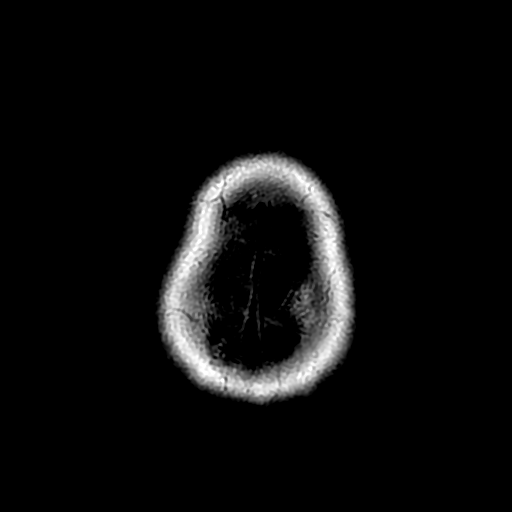

[Series 15: T2 · coronal · 5.0mm · 0.43mm/px · 2 of 26 slices shown (2 of 2)]
[im 1/26]
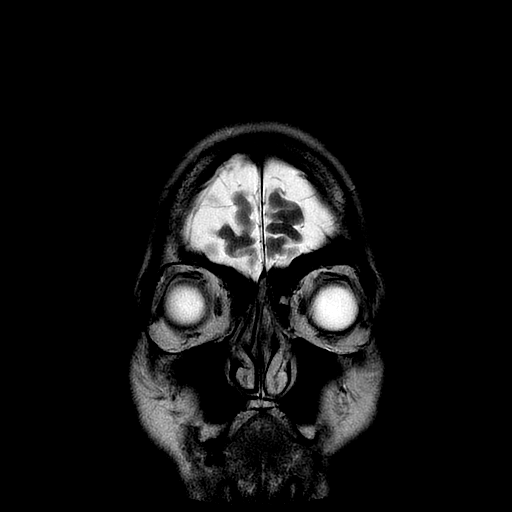
[im 26/26]
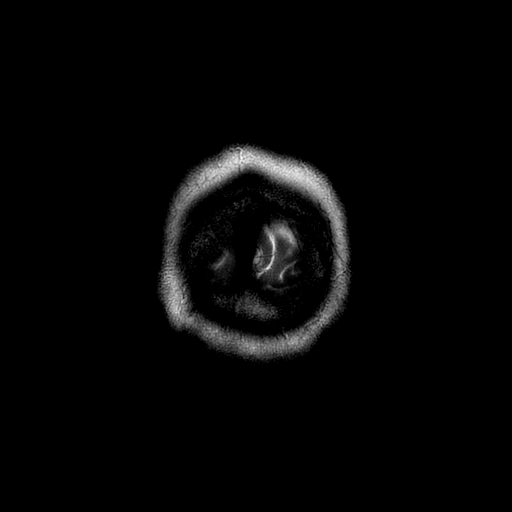

[Series 700: DWI · axial · 3.0mm · 1.09mm/px · z∈[-73,+68]mm · 4 of 49 slices shown (3 of 4)]
[im 1/49]
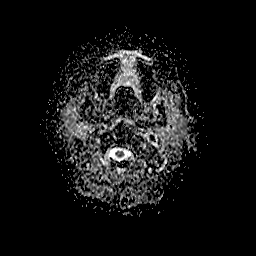
[im 17/49]
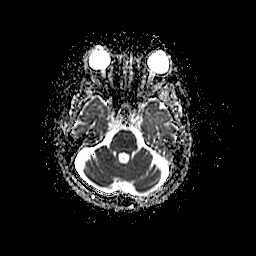
[im 33/49]
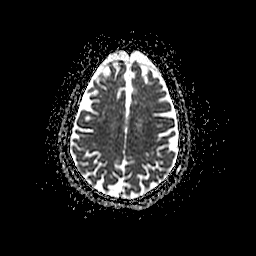
[im 49/49]
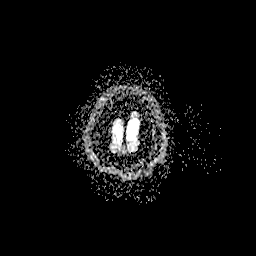

[Series 900: DWI · coronal · 5.0mm · 1.09mm/px · 3 of 41 slices shown (4 of 4)]
[im 1/41]
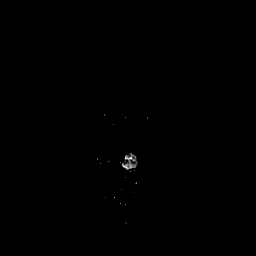
[im 21/41]
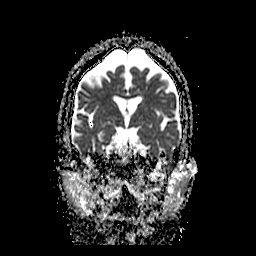
[im 41/41]
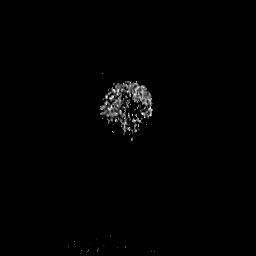

[32 of 48 positions shown; findings below may reference images not displayed]

FINDINGS: MRI HEAD FINDINGS

Brain: There is a punctate acute cortical infarct at the medial left
temporal occipital junction. There is subtly asymmetric increased
trace diffusion signal along the left insula without convincing
restricted diffusion. No acute large territory infarct is present.
There is no evidence of intracranial hemorrhage, mass, midline
shift, or extra-axial fluid collection. Scattered cerebral white
matter T2 hyperintensities are nonspecific but compatible with
minimal chronic small vessel ischemic disease. There is mild
cerebral atrophy.

Vascular: Major intracranial vascular flow voids are preserved.

Skull and upper cervical spine: Unremarkable bone marrow signal.

Sinuses/Orbits: Bilateral cataract extraction. Small volume fluid in
the left sphenoid sinus. Small mastoid effusions.

Other: None.

MRA HEAD FINDINGS

The visualized distal vertebral arteries are widely patent to the
basilar and codominant. Patent PICA and SCA origins are visualized
bilaterally. The basilar artery is widely patent. There is a fetal
origin of the right PCA. The left posterior communicating artery is
diminutive or absent. PCAs are patent without evidence of
significant stenosis.

The internal carotid arteries are patent from skullbase to carotid
termini the cavernous segments are tortuous with mild to possibly
moderate stenosis bilaterally. There has been interval left MCA
revascularization. The MCAs and ACAs are patent bilaterally without
evidence of proximal branch occlusion or significant proximal
stenosis. No intracranial aneurysm is identified.
IMPRESSION: 1. Punctate left temporal-occipital infarct.
2. Artifact versus minimal infarction along the left insula.
3. Minimal chronic small vessel ischemic disease.
4. Patent left MCA following revascularization without significant
stenosis.
5. Bilateral ICA atherosclerosis with mild-to-moderate narrowing.

## 2019-07-08 ENCOUNTER — Telehealth: Payer: Self-pay | Admitting: Neurology

## 2019-07-08 NOTE — Telephone Encounter (Signed)
I called Amber Barnett at Dr Truman Hayward office oral surgery at [phone]. I stated in Dr. Leonie Man note he states pt can can hold Xarelto for 3 days prior to her scheduled dental extractions with a small but acceptable periprocedural risk of TIA/stroke if she is willing. Amber Barnett stated pt told them Dr. Leonie Man did not want her to have the dental extractions. I stated her stroke was September 2018 and she was cleared from his last office note.Amber Barnett wanted the clearance letter fax to [phone].

## 2019-07-08 NOTE — Telephone Encounter (Signed)
Amber Barnett from Dr. Marguerita Beards office called to speak with RN regarding why pt was not advised at this time to do the extractions that were not cleared. Please advise.

## 2019-07-09 NOTE — Telephone Encounter (Signed)
Letter fax twice to [employer] [facility] Mackie at [phone]. Letter fax twice and confirmed. Letter states pt may hold Xarelto for 3 days prior to schedule dental extractions with a small but acceptable periprocedural risk of TIA/stroke if willing. Pt can resume medication when safe.

## 2019-09-22 ENCOUNTER — Telehealth: Payer: Self-pay | Admitting: Neurology

## 2019-09-22 NOTE — Telephone Encounter (Signed)
Patients niece Delgrachia is calling in wanting to know if it is ok for patient to receive COVID Vaccine on Thursday

## 2019-09-22 NOTE — Telephone Encounter (Signed)
I called pt niece back to state its best to call her PCP who manages her overall care. Pt does not have a autoimmune disease. She verbalized understanding.

## 2019-12-25 ENCOUNTER — Emergency Department (HOSPITAL_BASED_OUTPATIENT_CLINIC_OR_DEPARTMENT_OTHER): Payer: Medicare HMO

## 2019-12-25 ENCOUNTER — Other Ambulatory Visit: Payer: Self-pay

## 2019-12-25 ENCOUNTER — Inpatient Hospital Stay (HOSPITAL_BASED_OUTPATIENT_CLINIC_OR_DEPARTMENT_OTHER)
Admission: EM | Admit: 2019-12-25 | Discharge: 2019-12-30 | DRG: 378 | Disposition: A | Discharge status: Home / Self Care | Payer: Medicare HMO | Attending: Family Medicine | Admitting: Family Medicine

## 2019-12-25 ENCOUNTER — Encounter (HOSPITAL_BASED_OUTPATIENT_CLINIC_OR_DEPARTMENT_OTHER): Payer: Self-pay

## 2019-12-25 DIAGNOSIS — T502X5A Adverse effect of carbonic-anhydrase inhibitors, benzothiadiazides and other diuretics, initial encounter: Secondary | ICD-10-CM | POA: Diagnosis present

## 2019-12-25 DIAGNOSIS — Z7901 Long term (current) use of anticoagulants: Secondary | ICD-10-CM

## 2019-12-25 DIAGNOSIS — K922 Gastrointestinal hemorrhage, unspecified: Secondary | ICD-10-CM | POA: Diagnosis not present

## 2019-12-25 DIAGNOSIS — K5731 Diverticulosis of large intestine without perforation or abscess with bleeding: Principal | ICD-10-CM | POA: Diagnosis present

## 2019-12-25 DIAGNOSIS — Z86718 Personal history of other venous thrombosis and embolism: Secondary | ICD-10-CM | POA: Diagnosis not present

## 2019-12-25 DIAGNOSIS — Z8673 Personal history of transient ischemic attack (TIA), and cerebral infarction without residual deficits: Secondary | ICD-10-CM | POA: Diagnosis not present

## 2019-12-25 DIAGNOSIS — Z853 Personal history of malignant neoplasm of breast: Secondary | ICD-10-CM | POA: Diagnosis not present

## 2019-12-25 DIAGNOSIS — Z90710 Acquired absence of both cervix and uterus: Secondary | ICD-10-CM | POA: Diagnosis not present

## 2019-12-25 DIAGNOSIS — I4891 Unspecified atrial fibrillation: Secondary | ICD-10-CM | POA: Diagnosis present

## 2019-12-25 DIAGNOSIS — Z20822 Contact with and (suspected) exposure to covid-19: Secondary | ICD-10-CM | POA: Diagnosis present

## 2019-12-25 DIAGNOSIS — Z79899 Other long term (current) drug therapy: Secondary | ICD-10-CM | POA: Diagnosis not present

## 2019-12-25 DIAGNOSIS — R791 Abnormal coagulation profile: Secondary | ICD-10-CM | POA: Diagnosis present

## 2019-12-25 DIAGNOSIS — D62 Acute posthemorrhagic anemia: Secondary | ICD-10-CM | POA: Diagnosis present

## 2019-12-25 DIAGNOSIS — K648 Other hemorrhoids: Secondary | ICD-10-CM | POA: Diagnosis present

## 2019-12-25 DIAGNOSIS — D509 Iron deficiency anemia, unspecified: Secondary | ICD-10-CM | POA: Diagnosis present

## 2019-12-25 DIAGNOSIS — I10 Essential (primary) hypertension: Secondary | ICD-10-CM | POA: Diagnosis present

## 2019-12-25 DIAGNOSIS — K219 Gastro-esophageal reflux disease without esophagitis: Secondary | ICD-10-CM | POA: Diagnosis present

## 2019-12-25 DIAGNOSIS — E876 Hypokalemia: Secondary | ICD-10-CM | POA: Diagnosis present

## 2019-12-25 DIAGNOSIS — Z87891 Personal history of nicotine dependence: Secondary | ICD-10-CM | POA: Diagnosis not present

## 2019-12-25 DIAGNOSIS — K625 Hemorrhage of anus and rectum: Secondary | ICD-10-CM | POA: Diagnosis present

## 2019-12-25 HISTORY — DX: Cardiac arrhythmia, unspecified: I49.9

## 2019-12-25 LAB — CBC WITH DIFFERENTIAL/PLATELET
Abs Immature Granulocytes: 0.02 10*3/uL (ref 0.00–0.07)
Basophils Absolute: 0 10*3/uL (ref 0.0–0.1)
Basophils Relative: 1 %
Eosinophils Absolute: 0.1 10*3/uL (ref 0.0–0.5)
Eosinophils Relative: 2 %
HCT: 31.7 % — ABNORMAL LOW (ref 36.0–46.0)
Hemoglobin: 10 g/dL — ABNORMAL LOW (ref 12.0–15.0)
Immature Granulocytes: 0 %
Lymphocytes Relative: 28 %
Lymphs Abs: 1.9 10*3/uL (ref 0.7–4.0)
MCH: 24.7 pg — ABNORMAL LOW (ref 26.0–34.0)
MCHC: 31.5 g/dL (ref 30.0–36.0)
MCV: 78.3 fL — ABNORMAL LOW (ref 80.0–100.0)
Monocytes Absolute: 0.6 10*3/uL (ref 0.1–1.0)
Monocytes Relative: 10 %
Neutro Abs: 4 10*3/uL (ref 1.7–7.7)
Neutrophils Relative %: 59 %
Platelets: 203 10*3/uL (ref 150–400)
RBC: 4.05 MIL/uL (ref 3.87–5.11)
RDW: 19.6 % — ABNORMAL HIGH (ref 11.5–15.5)
WBC: 6.6 10*3/uL (ref 4.0–10.5)
nRBC: 0 % (ref 0.0–0.2)

## 2019-12-25 LAB — RESPIRATORY PANEL BY RT PCR (FLU A&B, COVID)
Influenza A by PCR: NEGATIVE
Influenza B by PCR: NEGATIVE
SARS Coronavirus 2 by RT PCR: NEGATIVE

## 2019-12-25 LAB — COMPREHENSIVE METABOLIC PANEL
ALT: 14 U/L (ref 0–44)
AST: 24 U/L (ref 15–41)
Albumin: 3.8 g/dL (ref 3.5–5.0)
Alkaline Phosphatase: 61 U/L (ref 38–126)
Anion gap: 7 (ref 5–15)
BUN: 16 mg/dL (ref 8–23)
CO2: 25 mmol/L (ref 22–32)
Calcium: 8.3 mg/dL — ABNORMAL LOW (ref 8.9–10.3)
Chloride: 112 mmol/L — ABNORMAL HIGH (ref 98–111)
Creatinine, Ser: 1.09 mg/dL — ABNORMAL HIGH (ref 0.44–1.00)
GFR calc Af Amer: 54 mL/min — ABNORMAL LOW (ref >60)
GFR calc non Af Amer: 47 mL/min — ABNORMAL LOW (ref >60)
Glucose, Bld: 94 mg/dL (ref 70–99)
Potassium: 3.3 mmol/L — ABNORMAL LOW (ref 3.5–5.1)
Sodium: 144 mmol/L (ref 135–145)
Total Bilirubin: 0.8 mg/dL (ref 0.3–1.2)
Total Protein: 7.3 g/dL (ref 6.5–8.1)

## 2019-12-25 LAB — APTT: aPTT: 43 seconds — ABNORMAL HIGH (ref 24–36)

## 2019-12-25 LAB — PROTIME-INR
INR: 4.4 (ref 0.8–1.2)
Prothrombin Time: 40.5 seconds — ABNORMAL HIGH (ref 11.4–15.2)

## 2019-12-25 LAB — OCCULT BLOOD X 1 CARD TO LAB, STOOL: Fecal Occult Bld: POSITIVE — AB

## 2019-12-25 NOTE — ED Notes (Signed)
Spoke with patient's niece; gave her an update on plan of care and patient's status; will call when patient has a room assigned; family verbalized understanding.

## 2019-12-25 NOTE — ED Notes (Signed)
Attempt to call report to [employer]; will retry again.

## 2019-12-25 NOTE — ED Triage Notes (Signed)
Pt c/o rectal bleeding with BM x 3 started yesterday-NAD-steady gait

## 2019-12-25 NOTE — ED Notes (Signed)
Date and time results received: 12/25/19 1743   Test: INR Critical Value:4.4  Name of Provider Notified: Erlene Quan PA  Orders Received? Or Actions Taken?: no orders given

## 2019-12-25 NOTE — ED Notes (Signed)
Spoke with patient's niece; update given on bed assignment.

## 2019-12-25 NOTE — ED Provider Notes (Signed)
[facility] EMERGENCY DEPARTMENT Provider Note   CSN: [id number] Arrival date & time: 12/25/19  1551     History Chief Complaint  Patient presents with  . Rectal Bleeding    Amber Barnett is a 86 y.o. female history atrial fibrillation on Xarelto, DVT, CVA, HTN.  Patient presents today for bright red blood in stool that began yesterday. She has had three bowel movements with what she describes as large amounts of bright red blood present. She denies having this problem before.  Denies recent illness, headache, vision changes, chest pain, SOB/Hemoptysis, abdominal pain, N/V/D, dysuria, hematuria, fever/chills or any additional concerns.  HPI     Past Medical History:  Diagnosis Date  . Atrial fibrillation ([place])   . DVT (deep venous thrombosis) ([place])   . History of left breast cancer   . Hypertension   . Stroke [facility])     Patient Active Problem List   Diagnosis Date Noted  . Current use of long term anticoagulation 05/21/2017  . Cerebral embolism with cerebral infarction ([place]) 05/18/2017  . Essential hypertension 05/08/2017  . Permanent atrial fibrillation ([place]) 05/08/2017    Past Surgical History:  Procedure Laterality Date  . IR ANGIO INTRA EXTRACRAN SEL COM CAROTID INNOMINATE UNI R MOD SED  05/18/2017  . IR ANGIO VERTEBRAL SEL SUBCLAVIAN INNOMINATE UNI R MOD SED  05/18/2017  . IR PERCUTANEOUS ART THROMBECTOMY/INFUSION INTRACRANIAL INC DIAG ANGIO  05/18/2017  . IR RADIOLOGIST EVAL & MGMT  07/19/2017  . RADIOLOGY WITH ANESTHESIA N/A 05/18/2017   Procedure: RADIOLOGY WITH ANESTHESIA;  Surgeon: Luanne Bras, MD;  Location: [facility];  Service: Radiology;  Laterality: N/A;     OB History   No obstetric history on file.     Family History  Problem Relation Age of Onset  . Stroke Son     Social History   Tobacco Use  . Smoking status: Former Smoker    Types: Cigarettes  . Smokeless tobacco: Never Used  Substance Use Topics  . Alcohol use: No  .  Drug use: Never    Home Medications Prior to Admission medications   Medication Sig Start Date End Date Taking? Authorizing Provider  amLODipine-valsartan (EXFORGE) 5-320 MG tablet Take 1 tablet by mouth daily. 08/17/16   [provider]  Cholecalciferol (VITAMIN D3) 2000 units capsule Take 2,000 Units by mouth daily.    [provider]  diltiazem (TIAZAC) 120 MG 24 hr capsule Take 120 mg by mouth daily. 04/26/17   [provider]  diphenhydrAMINE (BENADRYL) 25 mg capsule Take 25 mg by mouth daily as needed for itching (allergic reaction).     [provider]  latanoprost (XALATAN) 0.005 % ophthalmic solution Place 1 drop into both eyes at bedtime. 10/16/16   [provider]  LORazepam (ATIVAN) 1 MG tablet Take 1 mg by mouth every 8 (eight) hours as needed for anxiety.     [provider]  LORazepam (ATIVAN) 1 MG tablet Take 1 mg by mouth.    [provider]  Magnesium 250 MG TABS Take 250 mg by mouth daily.    [provider]  pantoprazole (PROTONIX) 40 MG tablet Take 40 mg by mouth daily. 04/26/17   [provider]  Potassium 99 MG TABS Take 99 mg by mouth daily.    [provider]  Rivaroxaban (XARELTO) 15 MG TABS tablet Take 15 mg by mouth daily with supper.    [provider]    Allergies    Fish-derived  products, Lactuca virosa, Milk-related compounds, Other, and Amoxicillin  Review of Systems   Review of Systems Ten systems are reviewed and are negative for acute change except as noted in the HPI   Physical Exam Updated Vital Signs BP (!) 155/85 (BP Location: Right Arm)   Pulse 75   Temp 98.8 F (37.1 C) (Oral)   Resp (!) 24   Ht 5\' 6"  (1.676 m)   Wt 70.3 kg   SpO2 95%   BMI 25.02 kg/m   Physical Exam Constitutional:      General: She is not in acute distress.    Appearance: Normal appearance. She is well-developed. She is not ill-appearing or diaphoretic.  HENT:     Head:  Normocephalic and atraumatic.     Right Ear: External ear normal.     Left Ear: External ear normal.     Nose: Nose normal.  Eyes:     General: Vision grossly intact. Gaze aligned appropriately.     Pupils: Pupils are equal, round, and reactive to light.  Neck:     Trachea: Trachea and phonation normal. No tracheal deviation.  Cardiovascular:     Rate and Rhythm: Normal rate and regular rhythm.     Pulses: Normal pulses.  Pulmonary:     Effort: Pulmonary effort is normal. No respiratory distress.  Abdominal:     General: There is no distension.     Palpations: Abdomen is soft.     Tenderness: There is no abdominal tenderness. There is no guarding or rebound.  Genitourinary:    Comments: Exam chaperoned by Mathis Fare.  No external hemorroids, unremarkable external exam. Normal rectal tone. No palpable masses, hemorrhoids, cracks or other abnormalities. Red blood present. Musculoskeletal:        General: Normal range of motion.     Cervical back: Normal range of motion.  Skin:    General: Skin is warm and dry.  Neurological:     Mental Status: She is alert.     GCS: GCS eye subscore is 4. GCS verbal subscore is 5. GCS motor subscore is 6.     Comments: Speech is clear and goal oriented, follows commands Major Cranial nerves without deficit, no facial droop Moves extremities without ataxia, coordination intact  Psychiatric:        Behavior: Behavior normal.     ED Results / Procedures / Treatments   Labs (all labs ordered are listed, but only abnormal results are displayed) Labs Reviewed  CBC WITH DIFFERENTIAL/PLATELET - Abnormal; Notable for the following components:      Result Value   Hemoglobin 10.0 (*)    HCT 31.7 (*)    MCV 78.3 (*)    MCH 24.7 (*)    RDW 19.6 (*)    All other components within normal limits  COMPREHENSIVE METABOLIC PANEL - Abnormal; Notable for the following components:   Potassium 3.3 (*)    Chloride 112 (*)    Creatinine, Ser 1.09 (*)     Calcium 8.3 (*)    GFR calc non Af Amer 47 (*)    GFR calc Af Amer 54 (*)    All other components within normal limits  OCCULT BLOOD X 1 CARD TO LAB, STOOL - Abnormal; Notable for the following components:   Fecal Occult Bld POSITIVE (*)    All other components within normal limits  PROTIME-INR - Abnormal; Notable for the following components:   Prothrombin Time 40.5 (*)    INR 4.4 (*)  All other components within normal limits  APTT - Abnormal; Notable for the following components:   aPTT 43 (*)    All other components within normal limits  RESPIRATORY PANEL BY RT PCR (FLU A&B, COVID)    EKG EKG Interpretation  Date/Time:  Thursday Dec 25 2019 17:06:22 EDT Ventricular Rate:  91 PR Interval:    QRS Duration: 92 QT Interval:  386 QTC Calculation: 475 R Axis:   -66 Text Interpretation: Atrial fibrillation Left anterior fascicular block Anteroseptal infarct, old Nonspecific T abnormalities, lateral leads Baseline wander in lead(s) V1 Confirmed by Virgel Manifold [phone]) on 12/25/2019 5:13:10 PM   Radiology DG Chest Portable 1 View  Result Date: 12/25/2019 CLINICAL DATA:  Rectal bleeding, lightheadedness EXAM: PORTABLE CHEST 1 VIEW COMPARISON:  06/13/2019, 05/14/2018 FINDINGS: Single frontal view of the chest demonstrates an enlarged cardiac silhouette. Background emphysema again noted. No airspace disease, effusion, or pneumothorax. Subsegmental atelectasis or scarring at the lung bases. No acute bony abnormalities. IMPRESSION: 1. Bibasilar scarring or atelectasis. 2. Enlarged cardiac silhouette. 3. Background emphysema.  No acute airspace disease. Electronically Signed   By: Randa Ngo M.D.   On: 12/25/2019 17:23    Procedures Procedures (including critical care time)  Medications Ordered in ED Medications - No data to display  ED Course  I have reviewed the triage vital signs and the nursing notes.  Pertinent labs & imaging results that were available during my care of  the patient were reviewed by me and considered in my medical decision making (see chart for details).  Clinical Course as of Dec 25 1815  Thu Dec 25, 2019  [place]   [BM]  [id number] Dr. Sabino Gasser   [BM]    Clinical Course User Index [BM] Gari Crown   MDM Rules/Calculators/A&P                     I have ordered reviewed and interpreted the following labs. CBC without anemia to suggest infection; hemoglobin of 10.0 appears slightly decreased from prior. CMP shows mild hypokalemia, creatinine appears near baseline. No emergent electrolyte degrangments, acute kidney injury or elevation of LFTs. INR elevated at 4.4. APTT elevated. Fecal occult blood positive.  CXR:  IMPRESSION:  1. Bibasilar scarring or atelectasis.  2. Enlarged cardiac silhouette.  3. Background emphysema. No acute airspace disease.  I have personally reviewed patient's cxr and agree with radiologist interpretation.  EKG: Atrial fibrillation Left anterior fascicular block Anteroseptal infarct, old Nonspecific T abnormalities, lateral leads Baseline wander in lead(s) V1 Confirmed by Virgel Manifold [phone]) on 12/25/2019 5:13:10 PM  Patinet is high risk for worsening given that she is on Xarelto and INR is 4.4. She is currently stable. Will consult hospitalist for admission. ---------------- Patinet reevaluated resting comfortably in bed, NAD, VSS. Patient is agreeable for admission.  Patient's case discussed with Dr. Wilson Singer who agrees with admission. ----------------- Discussed case with Dr. Sabino Gasser at [facility] who has accepted patient to hospitalist service    Note: Portions of this report may have been transcribed using voice recognition software. Every effort was made to ensure accuracy; however, inadvertent computerized transcription errors may still be present. Final Clinical Impression(s) / ED Diagnoses Final diagnoses:  Acute GI bleeding    Rx / DC Orders ED Discharge Orders    None        Gari Crown 12/25/19 1818    Virgel Manifold, MD 12/26/19 [facility]

## 2019-12-26 ENCOUNTER — Inpatient Hospital Stay (HOSPITAL_COMMUNITY): Payer: Medicare HMO

## 2019-12-26 DIAGNOSIS — K922 Gastrointestinal hemorrhage, unspecified: Secondary | ICD-10-CM

## 2019-12-26 DIAGNOSIS — K625 Hemorrhage of anus and rectum: Secondary | ICD-10-CM

## 2019-12-26 LAB — BASIC METABOLIC PANEL
Anion gap: 8 (ref 5–15)
BUN: 16 mg/dL (ref 8–23)
CO2: 22 mmol/L (ref 22–32)
Calcium: 8 mg/dL — ABNORMAL LOW (ref 8.9–10.3)
Chloride: 112 mmol/L — ABNORMAL HIGH (ref 98–111)
Creatinine, Ser: 0.99 mg/dL (ref 0.44–1.00)
GFR calc Af Amer: >60 mL/min (ref >60)
GFR calc non Af Amer: 53 mL/min — ABNORMAL LOW (ref >60)
Glucose, Bld: 90 mg/dL (ref 70–99)
Potassium: 3.3 mmol/L — ABNORMAL LOW (ref 3.5–5.1)
Sodium: 142 mmol/L (ref 135–145)

## 2019-12-26 LAB — CBC
HCT: 27.5 % — ABNORMAL LOW (ref 36.0–46.0)
Hemoglobin: 8.4 g/dL — ABNORMAL LOW (ref 12.0–15.0)
MCH: 24.4 pg — ABNORMAL LOW (ref 26.0–34.0)
MCHC: 30.5 g/dL (ref 30.0–36.0)
MCV: 79.9 fL — ABNORMAL LOW (ref 80.0–100.0)
Platelets: 162 10*3/uL (ref 150–400)
RBC: 3.44 MIL/uL — ABNORMAL LOW (ref 3.87–5.11)
RDW: 19.3 % — ABNORMAL HIGH (ref 11.5–15.5)
WBC: 6.1 10*3/uL (ref 4.0–10.5)
nRBC: 0 % (ref 0.0–0.2)

## 2019-12-26 LAB — IRON AND TIBC
Iron: 29 ug/dL (ref 28–170)
Saturation Ratios: 9 % — ABNORMAL LOW (ref 10.4–31.8)
TIBC: 334 ug/dL (ref 250–450)
UIBC: 305 ug/dL

## 2019-12-26 LAB — MAGNESIUM: Magnesium: 1.9 mg/dL (ref 1.7–2.4)

## 2019-12-26 LAB — FERRITIN: Ferritin: 13 ng/mL (ref 11–307)

## 2019-12-26 MED ORDER — LABETALOL HCL 5 MG/ML IV SOLN
10.0000 mg | INTRAVENOUS | Status: DC | PRN
  Filled 2019-12-26: qty 4

## 2019-12-26 MED ORDER — LATANOPROST 0.005 % OP SOLN
1.0000 [drp] | Freq: Every day | OPHTHALMIC | Status: DC
  Administered 2019-12-26 – 2019-12-29 (×4): 1 [drp] via OPHTHALMIC
  Filled 2019-12-26: qty 2.5

## 2019-12-26 MED ORDER — LACTATED RINGERS IV SOLN: INTRAVENOUS | Status: DC

## 2019-12-26 MED ORDER — IOHEXOL 300 MG/ML  SOLN
100.0000 mL | Freq: Once | INTRAMUSCULAR | Status: AC | PRN
  Administered 2019-12-26: 18:00:00 100 mL via INTRAVENOUS

## 2019-12-26 MED ORDER — SODIUM CHLORIDE (PF) 0.9 % IJ SOLN
INTRAMUSCULAR | Status: AC
  Filled 2019-12-26: qty 50

## 2019-12-26 MED ORDER — HYDRALAZINE HCL 25 MG PO TABS
25.0000 mg | ORAL_TABLET | ORAL | Status: DC | PRN
  Administered 2019-12-28 (×2): 25 mg via ORAL
  Filled 2019-12-26 (×2): qty 1

## 2019-12-26 MED ORDER — LORAZEPAM 1 MG PO TABS: 1.0000 mg | ORAL_TABLET | Freq: Two times a day (BID) | ORAL | Status: DC | PRN

## 2019-12-26 MED ORDER — PANTOPRAZOLE SODIUM 40 MG IV SOLR
40.0000 mg | INTRAVENOUS | Status: DC
  Administered 2019-12-26 – 2019-12-29 (×4): 40 mg via INTRAVENOUS
  Filled 2019-12-26 (×5): qty 40

## 2019-12-26 MED ORDER — IOHEXOL 9 MG/ML PO SOLN
500.0000 mL | ORAL | Status: AC
  Administered 2019-12-26 (×2): 500 mL via ORAL

## 2019-12-26 MED ORDER — IOHEXOL 9 MG/ML PO SOLN
ORAL | Status: AC
  Filled 2019-12-26: qty 1000

## 2019-12-26 NOTE — H&P (View-Only) (Signed)
[facility] Consult  Referring Provider: Dr.Samtani/[facility] Primary Care Physician:  Egbert Garibaldi, PA-C Primary Gastroenterologist: [facility]  Reason for Consultation: Rectal bleeding  HPI: Amber Barnett is a 86 y.o. female was in her usual state of health until 2 days ago when she had black/dark stool on Wednesday, another small dark bowel movement the same day and yesterday had formed stools mixed with bright red blood(liquid red stool). This was not associated with rectal or abdominal pain. Normally, patient has daily bowel movements and denies constipation or diarrhea. She denies unintentional weight loss, in fact has gained some weight recently and reports a good appetite. She has mild acid reflux and heartburn and intermittent trouble swallowing which has been investigated with a barium swallow 1 year ago, and as per patient it was unremarkable. Patient otherwise denies bloating, early satiety. She is on Xarelto. Since admission she has not had any bowel movements of rectal bleeding.  Her last colonoscopy was over 10 years ago, she thinks it was unremarkable, was recommended no repeat due to age. There is no family member with history of colon cancer.  Past Medical History:  Diagnosis Date  . Atrial fibrillation ([place])   . DVT (deep venous thrombosis) ([place])   . Dysrhythmia   . History of left breast cancer   . Hypertension   . Stroke [facility])     Past Surgical History:  Procedure Laterality Date  . IR ANGIO INTRA EXTRACRAN SEL COM CAROTID INNOMINATE UNI R MOD SED  05/18/2017  . IR ANGIO VERTEBRAL SEL SUBCLAVIAN INNOMINATE UNI R MOD SED  05/18/2017  . IR PERCUTANEOUS ART THROMBECTOMY/INFUSION INTRACRANIAL INC DIAG ANGIO  05/18/2017  . IR RADIOLOGIST EVAL & MGMT  07/19/2017  . RADIOLOGY WITH ANESTHESIA N/A 05/18/2017   Procedure: RADIOLOGY WITH ANESTHESIA;  Surgeon: Luanne Bras, MD;  Location: [facility];  Service: Radiology;   Laterality: N/A;    Prior to Admission medications   Medication Sig Start Date End Date Taking? Authorizing Provider  amLODipine-valsartan (EXFORGE) 5-320 MG tablet Take 1 tablet by mouth daily. 08/17/16  Yes [provider]  Cholecalciferol (VITAMIN D3) 2000 units capsule Take 2,000 Units by mouth daily.   Yes [provider]  diltiazem (TIAZAC) 120 MG 24 hr capsule Take 120 mg by mouth daily. 04/26/17  Yes [provider]  diphenhydrAMINE (BENADRYL) 25 mg capsule Take 25 mg by mouth daily as needed for itching (allergic reaction).    Yes [provider]  famotidine (PEPCID) 20 MG tablet Take 20 mg by mouth daily. 11/27/19  Yes [provider]  latanoprost (XALATAN) 0.005 % ophthalmic solution Place 1 drop into both eyes at bedtime. 10/16/16  Yes [provider]  LORazepam (ATIVAN) 1 MG tablet Take 1 mg by mouth every 8 (eight) hours as needed for anxiety.    Yes [provider]  MAGNESIUM-OXIDE 400 (241.3 Mg) MG tablet Take 800 mg by mouth daily.  12/20/19  Yes [provider]  pantoprazole (PROTONIX) 40 MG tablet Take 40 mg by mouth daily. 04/26/17  Yes [provider]  Potassium 99 MG TABS Take 99 mg by mouth every other day.    Yes [provider]  Rivaroxaban (XARELTO) 15 MG TABS tablet Take 15 mg by mouth daily with supper.   Yes [provider]    Current Facility-Administered Medications  Medication Dose Route Frequency Provider Last Rate Last Admin  . hydrALAZINE (APRESOLINE) tablet 25 mg  25 mg Oral Q4H PRN Dwyane Dee, MD      .  labetalol (NORMODYNE) injection 10 mg  10 mg Intravenous Q4H PRN Dwyane Dee, MD      . lactated ringers infusion   Intravenous Continuous Dwyane Dee, MD 75 mL/hr at 12/26/19 0046 New Bag at 12/26/19 0046  . latanoprost (XALATAN) 0.005 % ophthalmic solution 1 drop  1 drop Both Eyes QHS Dwyane Dee, MD      . LORazepam (ATIVAN) tablet 1 mg  1 mg Oral BID  PRN Dwyane Dee, MD      . pantoprazole (PROTONIX) injection 40 mg  40 mg Intravenous Q24H Dwyane Dee, MD   40 mg at 12/26/19 1116    Allergies as of 12/25/2019 - Review Complete 12/25/2019  Allergen Reaction Noted  . Fish-derived products Swelling 07/30/2014  . Lactuca virosa Diarrhea 02/08/2016  . Milk-related compounds Diarrhea 02/08/2016  . Other Swelling 05/18/2017  . Amoxicillin Nausea And Vomiting 01/31/2016    Family History  Problem Relation Age of Onset  . Stroke Son     Social History   Socioeconomic History  . Marital status: Widowed    Spouse name: Not on file  . Number of children: Not on file  . Years of education: Not on file  . Highest education level: Not on file  Occupational History  . Not on file  Tobacco Use  . Smoking status: Former Smoker    Types: Cigarettes  . Smokeless tobacco: Never Used  Substance and Sexual Activity  . Alcohol use: No  . Drug use: Never  . Sexual activity: Not on file  Other Topics Concern  . Not on file  Social History Narrative  . Not on file   Social Determinants of Health   Financial Resource Strain:   . Difficulty of Paying Living Expenses:   Food Insecurity:   . Worried About Charity fundraiser in the Last Year:   . Arboriculturist in the Last Year:   Transportation Needs:   . Film/video editor (Medical):   Marland Kitchen Lack of Transportation (Non-Medical):   Physical Activity:   . Days of Exercise per Week:   . Minutes of Exercise per Session:   Stress:   . Feeling of Stress :   Social Connections:   . Frequency of Communication with Friends and Family:   . Frequency of Social Gatherings with Friends and Family:   . Attends Religious Services:   . Active Member of Clubs or Organizations:   . Attends Archivist Meetings:   Marland Kitchen Marital Status:   Intimate Partner Violence:   . Fear of Current or Ex-Partner:   . Emotionally Abused:   Marland Kitchen Physically Abused:   . Sexually Abused:     Review of  Systems:  GI: Described in detail in HPI.    Gen: Denies any fever, chills, rigors, night sweats, anorexia, fatigue, weakness, malaise, involuntary weight loss, and sleep disorder CV: Denies chest pain, angina, palpitations, syncope, orthopnea, PND, peripheral edema, and claudication. Resp: Denies dyspnea, cough, sputum, wheezing, coughing up blood. GU : Denies urinary burning, blood in urine, urinary frequency, urinary hesitancy, nocturnal urination, and urinary incontinence. MS: Denies joint pain or swelling.  Denies muscle weakness, cramps, atrophy.  Derm: Denies rash, itching, oral ulcerations, hives, unhealing ulcers.  Psych: Denies depression, anxiety, memory loss, suicidal ideation, hallucinations,  and confusion. Heme: Denies bruising, bleeding, and enlarged lymph nodes. Neuro:  Denies any headaches, dizziness, paresthesias. Endo:  Denies any problems with DM, thyroid, adrenal function.  Physical Exam: Vital signs in last 24  hours: Temp:  [98 F (36.7 C)-98.9 F (37.2 C)] 98 F (36.7 C) (05/07 1200) Pulse Rate:  [46-88] 83 (05/07 1200) Resp:  [16-30] 18 (05/07 1200) BP: (131-162)/(65-101) 160/79 (05/07 1200) SpO2:  [91 %-99 %] 91 % (05/07 1200) Weight:  [70.3 kg] 70.3 kg (05/06 1606) Last BM Date: 12/25/19  General:   Alert,  Well-developed, well-nourished, pleasant and cooperative in NAD Head:  Normocephalic and atraumatic. Eyes:  Sclera clear, no icterus.  Mild pallor Ears:  Normal auditory acuity. Nose:  No deformity, discharge,  or lesions. Mouth:  No deformity or lesions.  Oropharynx pink & moist. Neck:  Supple; no masses or thyromegaly. Lungs:  Clear throughout to auscultation.   No wheezes, crackles, or rhonchi. No acute distress. Heart: Irregular rate  Extremities:  Without clubbing or edema. Neurologic:  Alert and  oriented x4;  grossly normal neurologically. Skin:  Intact without significant lesions or rashes. Psych:  Alert and cooperative. Normal mood and  affect. Abdomen:  Soft, nontender and nondistended. No masses, hepatosplenomegaly or hernias noted. Normal bowel sounds, without guarding, and without rebound.         Lab Results: Recent Labs    12/25/19 1657 12/26/19 0518  WBC 6.6 6.1  HGB 10.0* 8.4*  HCT 31.7* 27.5*  PLT 203 162   BMET Recent Labs    12/25/19 1657 12/26/19 0518  NA 144 142  K 3.3* 3.3*  CL 112* 112*  CO2 25 22  GLUCOSE 94 90  BUN 16 16  CREATININE 1.09* 0.99  CALCIUM 8.3* 8.0*   LFT Recent Labs    12/25/19 1657  PROT 7.3  ALBUMIN 3.8  AST 24  ALT 14  ALKPHOS 61  BILITOT 0.8   PT/INR Recent Labs    12/25/19 1657  LABPROT 40.5*  INR 4.4*    Studies/Results: DG Chest Portable 1 View  Result Date: 12/25/2019 CLINICAL DATA:  Rectal bleeding, lightheadedness EXAM: PORTABLE CHEST 1 VIEW COMPARISON:  06/13/2019, 05/14/2018 FINDINGS: Single frontal view of the chest demonstrates an enlarged cardiac silhouette. Background emphysema again noted. No airspace disease, effusion, or pneumothorax. Subsegmental atelectasis or scarring at the lung bases. No acute bony abnormalities. IMPRESSION: 1. Bibasilar scarring or atelectasis. 2. Enlarged cardiac silhouette. 3. Background emphysema.  No acute airspace disease. Electronically Signed   By: Randa Ngo M.D.   On: 12/25/2019 17:23    Impression: Hematochezia-painless, differential diagnosis to consider includes diverticulosis or a colonic mass, or bleeding from hemorrhoids Rectal exam performed by Dr. Verlon Au -noted to have hemorrhoids  Hemoglobin 10 on admission, 8.4 today Surprisingly, PT and INR are both elevated at 40.5 and 4.4, including APTT of 43 Iron deficiency, iron saturation 9%, ferritin 13  Mild hypokalemia   Plan: Discussed about colonoscopy versus CAT scan of the abdomen and pelvis for further evaluation. The patient as well as the niece present at bedside do not want colonoscopy(due to need for colonic prep and anesthesia) unless  absolutely indicated, due to her age, multiple comorbidities and increased PT/INR. For now, we will proceed with a CAT scan of the abdomen and pelvis with oral and IV contrast. INR is elevated, hold Xarelto at least for another 24 hours. Okay to start clear liquid diet.    LOS: 1 day   Ronnette Juniper, MD  12/26/2019, 1:01 PM

## 2019-12-26 NOTE — Progress Notes (Signed)
  I agree with plan of care as per my partner who admitted this patient Dr. Sabino Gasser  85-fem atrial fibrillation, DVT supposed to be on Eliquis, L breast cancer embolic left MCA stroke [id number] status post revascularization of occlusion MCA by IR She was last seen by her neurologist Dr. Leonie Man 06/10/2019 who recommended continue Xarelto statin and goal-directed therapy for secondary prevention of stroke Returns to the hospital via med center [place] liquid red stool Chart review reveals easier dosing with Xarelto and hence has been on that instead of Eliquis No NSAIDs no other issues   Patient seen and examined at bedside independently she is not having any further bleeding per nursing  Iron studies show iron 29 saturation ratios 9 We will consult nonemergently GI  On exam She is awake coherent in nad no focal defciit younger than stated age cta b s1 s 2no m/r/g abd soft--hemorrhoids 12, 7 and 6 pm position--not bleeding Neuro intact  P Appreciate Dr. Acie Fredrickson GI] assist--do not reverse INR, Get CTA abd/pelv, cycle hemoglobin Expect d/c ~48 hours

## 2019-12-26 NOTE — Hospital Course (Addendum)
Ms. Berendsen is an

## 2019-12-26 NOTE — H&P (Signed)
History and Physical    Amber Barnett [id number] DOB: 1934-02-21 DOA: 12/25/2019  PCP: Egbert Garibaldi, PA-C Patient coming from: home  I have personally briefly reviewed patient's old medical records in [place]  Chief Complaint: rectal bleeding  HPI: Amber Barnett is an 86 yo AA female with PMH afib (on Xarelto), CVA (s/p mech embolectomy 04/2017, no residual deficits, follows with Dr. Leonie Man), HTN who presented to [facility] with rectal bleeding.  She states that the bleeding began Wednesday morning.  It started out with stool mixed with dark/red blood.  On the morning of admission it had transitioned into "liquid red stool".  She did endorse feeling a little dizzy and lightheaded after the episode at home. She denied any LOC. She denies any prior history of this happening before. She has been compliant with Xarelto (of note, taken off Eliquis after CVA in 2018 in exchange for easier dosing (e.g. daily) with Xarelto).  She quit smoking 20 years ago, denies etoh and illicit drug use. She does not take NSAIDs, BC powder, or other caustic GI meds. She is on protonix at home as well.  She cannot recall when her last CLN was nor pertinent findings if any.   On workup at she was found to have Hgb 10 which was her baseline compared to 2 years prior. She was hemodynamically stable and asymptomatic.  She was transferred to [facility] for further workup and GI evaluation.   Review of Systems: As per HPI otherwise 10 point review of systems negative.   Past Medical History:  Diagnosis Date  . Atrial fibrillation ([place])   . DVT (deep venous thrombosis) ([place])   . Dysrhythmia   . History of left breast cancer   . Hypertension   . Stroke [facility])     Past Surgical History:  Procedure Laterality Date  . IR ANGIO INTRA EXTRACRAN SEL COM CAROTID INNOMINATE UNI R MOD SED  05/18/2017  . IR ANGIO VERTEBRAL SEL SUBCLAVIAN INNOMINATE UNI R MOD SED  05/18/2017  . IR PERCUTANEOUS ART THROMBECTOMY/INFUSION  INTRACRANIAL INC DIAG ANGIO  05/18/2017  . IR RADIOLOGIST EVAL & MGMT  07/19/2017  . RADIOLOGY WITH ANESTHESIA N/A 05/18/2017   Procedure: RADIOLOGY WITH ANESTHESIA;  Surgeon: Luanne Bras, MD;  Location: [place];  Service: Radiology;  Laterality: N/A;     reports that she has quit smoking. Her smoking use included cigarettes. She has never used smokeless tobacco. She reports that she does not drink alcohol or use drugs.  Allergies  Allergen Reactions  . Fish-Derived Products Swelling    Facial swelling then tongue swelling  . Lactuca Virosa Diarrhea  . Milk-Related Compounds Diarrhea    WHOLE MILK  . Other Swelling    Allergy to all nuts - facial swelling, then tongue swelling  . Amoxicillin Nausea And Vomiting    Family History  Problem Relation Age of Onset  . Stroke Son    Prior to Admission medications   Medication Sig Start Date End Date Taking? Authorizing Provider  amLODipine-valsartan (EXFORGE) 5-320 MG tablet Take 1 tablet by mouth daily. 08/17/16   [provider]  Cholecalciferol (VITAMIN D3) 2000 units capsule Take 2,000 Units by mouth daily.    [provider]  diltiazem (TIAZAC) 120 MG 24 hr capsule Take 120 mg by mouth daily. 04/26/17   [provider]  diphenhydrAMINE (BENADRYL) 25 mg capsule Take 25 mg by mouth daily as needed for itching (allergic reaction).     [provider]  latanoprost Ivin Poot)  0.005 % ophthalmic solution Place 1 drop into both eyes at bedtime. 10/16/16   [provider]  LORazepam (ATIVAN) 1 MG tablet Take 1 mg by mouth every 8 (eight) hours as needed for anxiety.     [provider]  LORazepam (ATIVAN) 1 MG tablet Take 1 mg by mouth.    [provider]  Magnesium 250 MG TABS Take 250 mg by mouth daily.    [provider]  pantoprazole (PROTONIX) 40 MG tablet Take 40 mg by mouth daily. 04/26/17   [provider]  Potassium 99 MG TABS Take 99 mg by mouth daily.     [provider]  Rivaroxaban (XARELTO) 15 MG TABS tablet Take 15 mg by mouth daily with supper.    [provider]    Physical Exam: Vitals:   12/25/19 2200 12/25/19 2230 12/25/19 2243 12/25/19 2335  BP: 131/72 132/66 131/82 140/72  Pulse: 61 (!) 59 82 64  Resp: (!) 26 (!) 30 (!) 24 16  Temp:   98.3 F (36.8 C) 98.9 F (37.2 C)  TempSrc:   Oral Oral  SpO2: 96% 95% 96% 96%  Weight:      Height:       General appearance: Pleasant elderly woman resting in bed in no distress Head: Normocephalic, without obvious abnormality Eyes: EOMI Lungs: clear to auscultation bilaterally Heart: irregularly irregular rhythm and S1, S2 normal Abdomen: normal findings: bowel sounds normal and soft, non-tender Extremities: No edema Skin: mobility and turgor normal Neurologic: Grossly normal  Labs on Admission: I have personally reviewed following labs and imaging studies  CBC: Recent Labs  Lab 12/25/19 1657  WBC 6.6  NEUTROABS 4.0  HGB 10.0*  HCT 31.7*  MCV 78.3*  PLT 123456   Basic Metabolic Panel: Recent Labs  Lab 12/25/19 1657  NA 144  K 3.3*  CL 112*  CO2 25  GLUCOSE 94  BUN 16  CREATININE 1.09*  CALCIUM 8.3*   GFR: Estimated Creatinine Clearance: 36.6 mL/min (A) (by C-G formula based on SCr of 1.09 mg/dL (H)). Liver Function Tests: Recent Labs  Lab 12/25/19 1657  AST 24  ALT 14  ALKPHOS 61  BILITOT 0.8  PROT 7.3  ALBUMIN 3.8   No results for input(s): LIPASE, AMYLASE in the last 168 hours. No results for input(s): AMMONIA in the last 168 hours. Coagulation Profile: Recent Labs  Lab 12/25/19 1657  INR 4.4*   Cardiac Enzymes: No results for input(s): CKTOTAL, CKMB, CKMBINDEX, TROPONINI in the last 168 hours. BNP (last 3 results) No results for input(s): PROBNP in the last 8760 hours. HbA1C: No results for input(s): HGBA1C in the last 72 hours. CBG: No results for input(s): GLUCAP in the last 168 hours. Lipid Profile: No results for  input(s): CHOL, HDL, LDLCALC, TRIG, CHOLHDL, LDLDIRECT in the last 72 hours. Thyroid Function Tests: No results for input(s): TSH, T4TOTAL, FREET4, T3FREE, THYROIDAB in the last 72 hours. Anemia Panel: No results for input(s): VITAMINB12, FOLATE, FERRITIN, TIBC, IRON, RETICCTPCT in the last 72 hours. Urine analysis:    Component Value Date/Time   COLORURINE YELLOW 05/18/2017 2009   APPEARANCEUR CLEAR 05/18/2017 2009   LABSPEC 1.035 (H) 05/18/2017 2009   PHURINE 6.0 05/18/2017 2009   GLUCOSEU NEGATIVE 05/18/2017 2009   HGBUR NEGATIVE 05/18/2017 2009   BILIRUBINUR NEGATIVE 05/18/2017 2009   [place] NEGATIVE 05/18/2017 2009   PROTEINUR 100 (A) 05/18/2017 2009   UROBILINOGEN >8.0 (H) 07/19/2014 0929   NITRITE NEGATIVE 05/18/2017 2009  LEUKOCYTESUR NEGATIVE 05/18/2017 2009    Radiological Exams on Admission: DG Chest Portable 1 View  Result Date: 12/25/2019 CLINICAL DATA:  Rectal bleeding, lightheadedness EXAM: PORTABLE CHEST 1 VIEW COMPARISON:  06/13/2019, 05/14/2018 FINDINGS: Single frontal view of the chest demonstrates an enlarged cardiac silhouette. Background emphysema again noted. No airspace disease, effusion, or pneumothorax. Subsegmental atelectasis or scarring at the lung bases. No acute bony abnormalities. IMPRESSION: 1. Bibasilar scarring or atelectasis. 2. Enlarged cardiac silhouette. 3. Background emphysema.  No acute airspace disease. Electronically Signed   By: Randa Ngo M.D.   On: 12/25/2019 17:23    EKG: Independently reviewed.  A. fib, rate 91  Assessment/Plan  GI bleed -Given stable hemodynamics and stable hemoglobin with story of maroon to red-colored stools, concern is for lower GI bleed at this time, likely contributed to by use of anticoagulation, Xarelto as well -Hold off on reversal at this time unless becomes hemodynamically unstable or further drop in hemoglobin -GI consult in a.m. -Keep n.p.o. -Trend H/H -Continue fluids -Continue IV  PPI  Microcytic anemia - check iron studies  Afib - hold dilt in setting of GIB; resume if needed - hold Xarelto - may need to consider neuro input if GIB found to be significant to consider change back to Eliquis vs other AC options; CHA2DS2-VASc = 6 and HAS-BLED = 4-5; she is high risk regardless and requires a risk/benefit conversation after GI workup complete  Hx CVA - s/p mechanical embolectomy Sept 2018; no residual deficits  - follows with Dr. Leonie Man, last seen Oct 2020 - xarelto on hold for GIB above; see afib as well   HTN - hold home meds for now in setting of GIB - use PRN labetalol or hydralazine    DVT prophylaxis: SCD Code Status: Full code Family Communication: none Disposition Plan: home Consults called: GI Admission status: Med-Surg   Dwyane Dee, MD [facility] Pager: Secure chat via Amion [place] pager # : [phone]  If 7PM-7AM, please contact night-coverage [email or web address] Use universal [place] password for that web site. If you do not have the password, please call the hospital operator.  12/26/2019, 1:29 AM

## 2019-12-26 NOTE — Consult Note (Signed)
[facility] Consult  Referring Provider: Dr.Samtani/[facility] Primary Care Physician:  Egbert Garibaldi, PA-C Primary Gastroenterologist: [facility]  Reason for Consultation: Rectal bleeding  HPI: Amber Barnett is a 86 y.o. female was in her usual state of health until 2 days ago when she had black/dark stool on Wednesday, another small dark bowel movement the same day and yesterday had formed stools mixed with bright red blood(liquid red stool). This was not associated with rectal or abdominal pain. Normally, patient has daily bowel movements and denies constipation or diarrhea. She denies unintentional weight loss, in fact has gained some weight recently and reports a good appetite. She has mild acid reflux and heartburn and intermittent trouble swallowing which has been investigated with a barium swallow 1 year ago, and as per patient it was unremarkable. Patient otherwise denies bloating, early satiety. She is on Xarelto. Since admission she has not had any bowel movements of rectal bleeding.  Her last colonoscopy was over 10 years ago, she thinks it was unremarkable, was recommended no repeat due to age. There is no family member with history of colon cancer.  Past Medical History:  Diagnosis Date  . Atrial fibrillation ([place])   . DVT (deep venous thrombosis) ([place])   . Dysrhythmia   . History of left breast cancer   . Hypertension   . Stroke [facility])     Past Surgical History:  Procedure Laterality Date  . IR ANGIO INTRA EXTRACRAN SEL COM CAROTID INNOMINATE UNI R MOD SED  05/18/2017  . IR ANGIO VERTEBRAL SEL SUBCLAVIAN INNOMINATE UNI R MOD SED  05/18/2017  . IR PERCUTANEOUS ART THROMBECTOMY/INFUSION INTRACRANIAL INC DIAG ANGIO  05/18/2017  . IR RADIOLOGIST EVAL & MGMT  07/19/2017  . RADIOLOGY WITH ANESTHESIA N/A 05/18/2017   Procedure: RADIOLOGY WITH ANESTHESIA;  Surgeon: Luanne Bras, MD;  Location: [place];  Service: Radiology;   Laterality: N/A;    Prior to Admission medications   Medication Sig Start Date End Date Taking? Authorizing Provider  amLODipine-valsartan (EXFORGE) 5-320 MG tablet Take 1 tablet by mouth daily. 08/17/16  Yes [provider]  Cholecalciferol (VITAMIN D3) 2000 units capsule Take 2,000 Units by mouth daily.   Yes [provider]  diltiazem (TIAZAC) 120 MG 24 hr capsule Take 120 mg by mouth daily. 04/26/17  Yes [provider]  diphenhydrAMINE (BENADRYL) 25 mg capsule Take 25 mg by mouth daily as needed for itching (allergic reaction).    Yes [provider]  famotidine (PEPCID) 20 MG tablet Take 20 mg by mouth daily. 11/27/19  Yes [provider]  latanoprost (XALATAN) 0.005 % ophthalmic solution Place 1 drop into both eyes at bedtime. 10/16/16  Yes [provider]  LORazepam (ATIVAN) 1 MG tablet Take 1 mg by mouth every 8 (eight) hours as needed for anxiety.    Yes [provider]  MAGNESIUM-OXIDE 400 (241.3 Mg) MG tablet Take 800 mg by mouth daily.  12/20/19  Yes [provider]  pantoprazole (PROTONIX) 40 MG tablet Take 40 mg by mouth daily. 04/26/17  Yes [provider]  Potassium 99 MG TABS Take 99 mg by mouth every other day.    Yes [provider]  Rivaroxaban (XARELTO) 15 MG TABS tablet Take 15 mg by mouth daily with supper.   Yes [provider]    Current Facility-Administered Medications  Medication Dose Route Frequency Provider Last Rate Last Admin  . hydrALAZINE (APRESOLINE) tablet 25 mg  25 mg Oral Q4H PRN Dwyane Dee, MD      .  labetalol (NORMODYNE) injection 10 mg  10 mg Intravenous Q4H PRN Dwyane Dee, MD      . lactated ringers infusion   Intravenous Continuous Dwyane Dee, MD 75 mL/hr at 12/26/19 0046 New Bag at 12/26/19 0046  . latanoprost (XALATAN) 0.005 % ophthalmic solution 1 drop  1 drop Both Eyes QHS Dwyane Dee, MD      . LORazepam (ATIVAN) tablet 1 mg  1 mg Oral BID  PRN Dwyane Dee, MD      . pantoprazole (PROTONIX) injection 40 mg  40 mg Intravenous Q24H Dwyane Dee, MD   40 mg at 12/26/19 1116    Allergies as of 12/25/2019 - Review Complete 12/25/2019  Allergen Reaction Noted  . Fish-derived products Swelling 07/30/2014  . Lactuca virosa Diarrhea 02/08/2016  . Milk-related compounds Diarrhea 02/08/2016  . Other Swelling 05/18/2017  . Amoxicillin Nausea And Vomiting 01/31/2016    Family History  Problem Relation Age of Onset  . Stroke Son     Social History   Socioeconomic History  . Marital status: Widowed    Spouse name: Not on file  . Number of children: Not on file  . Years of education: Not on file  . Highest education level: Not on file  Occupational History  . Not on file  Tobacco Use  . Smoking status: Former Smoker    Types: Cigarettes  . Smokeless tobacco: Never Used  Substance and Sexual Activity  . Alcohol use: No  . Drug use: Never  . Sexual activity: Not on file  Other Topics Concern  . Not on file  Social History Narrative  . Not on file   Social Determinants of Health   Financial Resource Strain:   . Difficulty of Paying Living Expenses:   Food Insecurity:   . Worried About Charity fundraiser in the Last Year:   . Arboriculturist in the Last Year:   Transportation Needs:   . Film/video editor (Medical):   Marland Kitchen Lack of Transportation (Non-Medical):   Physical Activity:   . Days of Exercise per Week:   . Minutes of Exercise per Session:   Stress:   . Feeling of Stress :   Social Connections:   . Frequency of Communication with Friends and Family:   . Frequency of Social Gatherings with Friends and Family:   . Attends Religious Services:   . Active Member of Clubs or Organizations:   . Attends Archivist Meetings:   Marland Kitchen Marital Status:   Intimate Partner Violence:   . Fear of Current or Ex-Partner:   . Emotionally Abused:   Marland Kitchen Physically Abused:   . Sexually Abused:     Review of  Systems:  GI: Described in detail in HPI.    Gen: Denies any fever, chills, rigors, night sweats, anorexia, fatigue, weakness, malaise, involuntary weight loss, and sleep disorder CV: Denies chest pain, angina, palpitations, syncope, orthopnea, PND, peripheral edema, and claudication. Resp: Denies dyspnea, cough, sputum, wheezing, coughing up blood. GU : Denies urinary burning, blood in urine, urinary frequency, urinary hesitancy, nocturnal urination, and urinary incontinence. MS: Denies joint pain or swelling.  Denies muscle weakness, cramps, atrophy.  Derm: Denies rash, itching, oral ulcerations, hives, unhealing ulcers.  Psych: Denies depression, anxiety, memory loss, suicidal ideation, hallucinations,  and confusion. Heme: Denies bruising, bleeding, and enlarged lymph nodes. Neuro:  Denies any headaches, dizziness, paresthesias. Endo:  Denies any problems with DM, thyroid, adrenal function.  Physical Exam: Vital signs in last 24  hours: Temp:  [98 F (36.7 C)-98.9 F (37.2 C)] 98 F (36.7 C) (05/07 1200) Pulse Rate:  [46-88] 83 (05/07 1200) Resp:  [16-30] 18 (05/07 1200) BP: (131-162)/(65-101) 160/79 (05/07 1200) SpO2:  [91 %-99 %] 91 % (05/07 1200) Weight:  [70.3 kg] 70.3 kg (05/06 1606) Last BM Date: 12/25/19  General:   Alert,  Well-developed, well-nourished, pleasant and cooperative in NAD Head:  Normocephalic and atraumatic. Eyes:  Sclera clear, no icterus.  Mild pallor Ears:  Normal auditory acuity. Nose:  No deformity, discharge,  or lesions. Mouth:  No deformity or lesions.  Oropharynx pink & moist. Neck:  Supple; no masses or thyromegaly. Lungs:  Clear throughout to auscultation.   No wheezes, crackles, or rhonchi. No acute distress. Heart: Irregular rate  Extremities:  Without clubbing or edema. Neurologic:  Alert and  oriented x4;  grossly normal neurologically. Skin:  Intact without significant lesions or rashes. Psych:  Alert and cooperative. Normal mood and  affect. Abdomen:  Soft, nontender and nondistended. No masses, hepatosplenomegaly or hernias noted. Normal bowel sounds, without guarding, and without rebound.         Lab Results: Recent Labs    12/25/19 1657 12/26/19 0518  WBC 6.6 6.1  HGB 10.0* 8.4*  HCT 31.7* 27.5*  PLT 203 162   BMET Recent Labs    12/25/19 1657 12/26/19 0518  NA 144 142  K 3.3* 3.3*  CL 112* 112*  CO2 25 22  GLUCOSE 94 90  BUN 16 16  CREATININE 1.09* 0.99  CALCIUM 8.3* 8.0*   LFT Recent Labs    12/25/19 1657  PROT 7.3  ALBUMIN 3.8  AST 24  ALT 14  ALKPHOS 61  BILITOT 0.8   PT/INR Recent Labs    12/25/19 1657  LABPROT 40.5*  INR 4.4*    Studies/Results: DG Chest Portable 1 View  Result Date: 12/25/2019 CLINICAL DATA:  Rectal bleeding, lightheadedness EXAM: PORTABLE CHEST 1 VIEW COMPARISON:  06/13/2019, 05/14/2018 FINDINGS: Single frontal view of the chest demonstrates an enlarged cardiac silhouette. Background emphysema again noted. No airspace disease, effusion, or pneumothorax. Subsegmental atelectasis or scarring at the lung bases. No acute bony abnormalities. IMPRESSION: 1. Bibasilar scarring or atelectasis. 2. Enlarged cardiac silhouette. 3. Background emphysema.  No acute airspace disease. Electronically Signed   By: Randa Ngo M.D.   On: 12/25/2019 17:23    Impression: Hematochezia-painless, differential diagnosis to consider includes diverticulosis or a colonic mass, or bleeding from hemorrhoids Rectal exam performed by Dr. Verlon Au -noted to have hemorrhoids  Hemoglobin 10 on admission, 8.4 today Surprisingly, PT and INR are both elevated at 40.5 and 4.4, including APTT of 43 Iron deficiency, iron saturation 9%, ferritin 13  Mild hypokalemia   Plan: Discussed about colonoscopy versus CAT scan of the abdomen and pelvis for further evaluation. The patient as well as the niece present at bedside do not want colonoscopy(due to need for colonic prep and anesthesia) unless  absolutely indicated, due to her age, multiple comorbidities and increased PT/INR. For now, we will proceed with a CAT scan of the abdomen and pelvis with oral and IV contrast. INR is elevated, hold Xarelto at least for another 24 hours. Okay to start clear liquid diet.    LOS: 1 day   Ronnette Juniper, MD  12/26/2019, 1:01 PM

## 2019-12-27 NOTE — Progress Notes (Signed)
Subjective: The patient was seen and examined at bedside. Her nurse endorses that patient had 3 episodes of rectal bleeding, maroon stool as well as bright red blood was noted. Patient feels that the rectal bleeding was small in amount. Bowel movement today was dark brown with no evidence of active bleeding. Denies abdominal pain.  Objective: Vital signs in last 24 hours: Temp:  [97.5 F (36.4 C)-98.6 F (37 C)] 98.6 F (37 C) (05/08 0543) Pulse Rate:  [48-83] 48 (05/08 0543) Resp:  [18-20] 20 (05/08 0543) BP: (139-172)/(71-94) 139/71 (05/08 0543) SpO2:  [91 %-99 %] 99 % (05/08 0543) Weight change:  Last BM Date: 12/26/19  PE: Not in distress, mild pallor GENERAL: Alert, oriented x3 ABDOMEN: Soft, nondistended, nontender EXTREMITIES: No deformity  Lab Results: Results for orders placed or performed during the hospital encounter of 12/25/19 (from the past 48 hour(s))  Occult blood card to lab, stool     Status: Abnormal   Collection Time: 12/25/19  4:35 PM  Result Value Ref Range   Fecal Occult Bld POSITIVE (A) NEGATIVE    Comment: Performed at [facility], [address] [place], [place] [address]  CBC with Differential     Status: Abnormal   Collection Time: 12/25/19  4:57 PM  Result Value Ref Range   WBC 6.6 4.0 - 10.5 K/uL   RBC 4.05 3.87 - 5.11 MIL/uL   Hemoglobin 10.0 (L) 12.0 - 15.0 g/dL   HCT 31.7 (L) 36.0 - 46.0 %   MCV 78.3 (L) 80.0 - 100.0 fL   MCH 24.7 (L) 26.0 - 34.0 pg   MCHC 31.5 30.0 - 36.0 g/dL   RDW 19.6 (H) 11.5 - 15.5 %   Platelets 203 150 - 400 K/uL   nRBC 0.0 0.0 - 0.2 %   Neutrophils Relative % 59 %   Neutro Abs 4.0 1.7 - 7.7 K/uL   Lymphocytes Relative 28 %   Lymphs Abs 1.9 0.7 - 4.0 K/uL   Monocytes Relative 10 %   Monocytes Absolute 0.6 0.1 - 1.0 K/uL   Eosinophils Relative 2 %   Eosinophils Absolute 0.1 0.0 - 0.5 K/uL   Basophils Relative 1 %   Basophils Absolute 0.0 0.0 - 0.1 K/uL   Immature Granulocytes 0 %   Abs  Immature Granulocytes 0.02 0.00 - 0.07 K/uL    Comment: Performed at [facility], [address] [place], [place] [address]  Comprehensive metabolic panel     Status: Abnormal   Collection Time: 12/25/19  4:57 PM  Result Value Ref Range   Sodium 144 135 - 145 mmol/L   Potassium 3.3 (L) 3.5 - 5.1 mmol/L   Chloride 112 (H) 98 - 111 mmol/L   CO2 25 22 - 32 mmol/L   Glucose, Bld 94 70 - 99 mg/dL    Comment: Glucose reference range applies only to samples taken after fasting for at least 8 hours.   BUN 16 8 - 23 mg/dL   Creatinine, Ser 1.09 (H) 0.44 - 1.00 mg/dL   Calcium 8.3 (L) 8.9 - 10.3 mg/dL   Total Protein 7.3 6.5 - 8.1 g/dL   Albumin 3.8 3.5 - 5.0 g/dL   AST 24 15 - 41 U/L   ALT 14 0 - 44 U/L   Alkaline Phosphatase 61 38 - 126 U/L   Total Bilirubin 0.8 0.3 - 1.2 mg/dL   GFR calc non Af Amer 47 (L) >60 mL/min   GFR calc Af  Amer 54 (L) >60 mL/min   Anion gap 7 5 - 15    Comment: Performed at [facility], [address] [place], [place] [address]  Protime-INR     Status: Abnormal   Collection Time: 12/25/19  4:57 PM  Result Value Ref Range   Prothrombin Time 40.5 (H) 11.4 - 15.2 seconds   INR 4.4 (HH) 0.8 - 1.2    Comment: CRITICAL RESULT CALLED TO, READ BACK BY AND VERIFIED WITH: REED CYNTHIA @ [id number] ON 12/25/2019, [email or web address] (NOTE) INR goal varies based on device and disease states. Performed at [facility], [address] [place], [place] [address]   APTT     Status: Abnormal   Collection Time: 12/25/19  4:57 PM  Result Value Ref Range   aPTT 43 (H) 24 - 36 seconds    Comment:        IF BASELINE aPTT IS ELEVATED, SUGGEST PATIENT RISK ASSESSMENT BE USED TO DETERMINE APPROPRIATE ANTICOAGULANT THERAPY. Performed at [facility], [address] [place], [place] [address]   Respiratory Panel by RT PCR (Flu A&B, Covid) - Nasopharyngeal Swab     Status: None   Collection Time: 12/25/19  5:58 PM   Specimen: Nasopharyngeal  Swab  Result Value Ref Range   SARS Coronavirus 2 by RT PCR NEGATIVE NEGATIVE    Comment: (NOTE) SARS-CoV-2 target nucleic acids are NOT DETECTED. The SARS-CoV-2 RNA is generally detectable in upper respiratoy specimens during the acute phase of infection. The lowest concentration of SARS-CoV-2 viral copies this assay can detect is 131 copies/mL. A negative result does not preclude SARS-Cov-2 infection and should not be used as the sole basis for treatment or other patient management decisions. A negative result may occur with  improper specimen collection/handling, submission of specimen other than nasopharyngeal swab, presence of viral mutation(s) within the areas targeted by this assay, and inadequate number of viral copies (<131 copies/mL). A negative result must be combined with clinical observations, patient history, and epidemiological information. The expected result is Negative. Fact Sheet for Patients:  PinkCheek.be Fact Sheet for Healthcare Providers:  GravelBags.it This test is not yet ap proved or cleared by the [place] FDA and  has been authorized for detection and/or diagnosis of SARS-CoV-2 by FDA under an Emergency Use Authorization (EUA). This EUA will remain  in effect (meaning this test can be used) for the duration of the COVID-19 declaration under Section 564(b)(1) of the Act, 21 U.S.C. section 360bbb-3(b)(1), unless the authorization is terminated or revoked sooner.    Influenza A by PCR NEGATIVE NEGATIVE   Influenza B by PCR NEGATIVE NEGATIVE    Comment: (NOTE) The Xpert Xpress SARS-CoV-2/FLU/RSV assay is intended as an aid in  the diagnosis of influenza from Nasopharyngeal swab specimens and  should not be used as a sole basis for treatment. Nasal washings and  aspirates are unacceptable for Xpert Xpress SARS-CoV-2/FLU/RSV  testing. Fact Sheet for  Patients: PinkCheek.be Fact Sheet for Healthcare Providers: GravelBags.it This test is not yet approved or cleared by the [place] FDA and  has been authorized for detection and/or diagnosis of SARS-CoV-2 by  FDA under an Emergency Use Authorization (EUA). This EUA will remain  in effect (meaning this test can be used) for the duration of the  Covid-19 declaration under Section 564(b)(1) of the Act, 21  U.S.C. section 360bbb-3(b)(1), unless the authorization is  terminated or revoked. Performed at [facility], [address] [place] [address] [place], [place] [id number]   Basic metabolic panel     Status: Abnormal   Collection Time: 12/26/19  5:18 AM  Result Value Ref Range   Sodium 142 135 - 145 mmol/L   Potassium 3.3 (L) 3.5 - 5.1 mmol/L   Chloride 112 (H) 98 - 111 mmol/L   CO2 22 22 - 32 mmol/L   Glucose, Bld 90 70 - 99 mg/dL    Comment: Glucose reference range applies only to samples taken after fasting for at least 8 hours.   BUN 16 8 - 23 mg/dL   Creatinine, Ser 0.99 0.44 - 1.00 mg/dL   Calcium 8.0 (L) 8.9 - 10.3 mg/dL   GFR calc non Af Amer 53 (L) >60 mL/min   GFR calc Af Amer >60 >60 mL/min   Anion gap 8 5 - 15    Comment: Performed at [facility], [place] [address], [place], [place] [address]  Magnesium     Status: None   Collection Time: 12/26/19  5:18 AM  Result Value Ref Range   Magnesium 1.9 1.7 - 2.4 mg/dL    Comment: Performed at [facility], [place] [address], [place], [place] [address]  CBC     Status: Abnormal   Collection Time: 12/26/19  5:18 AM  Result Value Ref Range   WBC 6.1 4.0 - 10.5 K/uL   RBC 3.44 (L) 3.87 - 5.11 MIL/uL   Hemoglobin 8.4 (L) 12.0 - 15.0 g/dL   HCT 27.5 (L) 36.0 - 46.0 %   MCV 79.9 (L) 80.0 - 100.0 fL   MCH 24.4 (L) 26.0 - 34.0 pg   MCHC 30.5 30.0 - 36.0 g/dL   RDW 19.3 (H) 11.5 - 15.5 %   Platelets 162 150 - 400 K/uL   nRBC 0.0 0.0 - 0.2 %     Comment: Performed at [facility] [place] [address], [place], [place] [address]  Iron and TIBC     Status: Abnormal   Collection Time: 12/26/19  5:18 AM  Result Value Ref Range   Iron 29 28 - 170 ug/dL   TIBC 334 250 - 450 ug/dL   Saturation Ratios 9 (L) 10.4 - 31.8 %   UIBC 305 ug/dL    Comment: Performed at [facility], [place] [address] [place], [place] [address]  Ferritin     Status: None   Collection Time: 12/26/19  5:18 AM  Result Value Ref Range   Ferritin 13 11 - 307 ng/mL    Comment: Performed at [facility], [place] [address], [place], [place] [address]    Studies/Results: CT ABDOMEN PELVIS W CONTRAST  Result Date: 12/26/2019 CLINICAL DATA:  Rectal bleeding. EXAM: CT ABDOMEN AND PELVIS WITH CONTRAST TECHNIQUE: Multidetector CT imaging of the abdomen and pelvis was performed using the standard protocol following bolus administration of intravenous contrast. CONTRAST:  117mL OMNIPAQUE IOHEXOL 300 MG/ML  SOLN COMPARISON:  None. FINDINGS: Lower chest: Mild enlargement of the heart with predominant enlargement of the right atrium. Small right pleural effusion. Mild linear and dependent lung base opacities, greater on the right, consistent with atelectasis. No evidence of pneumonia or pulmonary edema. Hepatobiliary: 9 mm low-attenuation lesion at the dome segment 7, nonspecific but consistent with a cyst or hemangioma. No other liver masses or lesions. Liver normal in size and attenuation. Normal gallbladder. No bile duct dilation. Pancreas: Unremarkable. No pancreatic ductal dilatation or surrounding inflammatory changes. Spleen: Normal in size without  focal abnormality. Adrenals/Urinary Tract: No adrenal masses. Kidneys normal in overall size, orientation and position with symmetric enhancement and excretion. Several low-attenuation masses, largest on the right, cortical, midpole laterally measuring 9 mm. Largest on the left posterior  midpole, 1 cm. These consistent with cysts. No stones. No hydronephrosis. Normal ureters. Normal bladder Stomach/Bowel: Wall of the rectum mildly thickened. There is no defined mass or adjacent inflammation. Numerous colonic diverticula. No diverticulitis. No colonic wall thickening or other inflammatory process. Normal stomach and small bowel. No evidence of appendicitis. Vascular/Lymphatic: Aortic atherosclerosis. No aneurysm. No enlarged lymph nodes. Reproductive: Status post hysterectomy. No adnexal masses. Other: No abdominal wall hernia or abnormality. No abdominopelvic ascites. Musculoskeletal: No fracture or acute finding. No osteoblastic or osteolytic lesions. IMPRESSION: 1. Mild wall thickening of the rectum is suggested, although this could be due to lack of distension. There is no evidence of a rectal or anal mass. No adjacent inflammation. 2. No convincing acute finding within the abdomen or pelvis. 3. Numerous colonic diverticula without evidence of diverticulitis. 4. Small right pleural effusion with lung base atelectasis. Right heart predominant cardiomegaly. 5. Other chronic findings include low-density renal masses consistent with cysts and aortic atherosclerosis. Electronically Signed   By: Lajean Manes M.D.   On: 12/26/2019 19:43   DG Chest Portable 1 View  Result Date: 12/25/2019 CLINICAL DATA:  Rectal bleeding, lightheadedness EXAM: PORTABLE CHEST 1 VIEW COMPARISON:  06/13/2019, 05/14/2018 FINDINGS: Single frontal view of the chest demonstrates an enlarged cardiac silhouette. Background emphysema again noted. No airspace disease, effusion, or pneumothorax. Subsegmental atelectasis or scarring at the lung bases. No acute bony abnormalities. IMPRESSION: 1. Bibasilar scarring or atelectasis. 2. Enlarged cardiac silhouette. 3. Background emphysema.  No acute airspace disease. Electronically Signed   By: Randa Ngo M.D.   On: 12/25/2019 17:23    Medications: I have reviewed the patient's  current medications.  Assessment: Painless hematochezia CAT scan showed mild wall thickening of the rectum without evidence of rectal or anal mass Numerous colonic diverticuli without diverticulitis History of stroke, last dose of Xarelto was on 12/25/2019 Hemoglobin 8.4, was 10 yesterday Patient's last colonoscopy was over 10 years ago  Plan: Discussed with patient that rectal bleeding may be hemorrhoidal or related to diverticulosis.No obvious mass noted on CT. Discussed about risk and benefits of colonoscopy. Discussed about need to be back on Xarelto with history of stroke. Patient wanted to get a clearance from neurologic team to see if there would be any contraindication for anesthesia. Dr. Verlon Au spoke with Dr.Lindzen from neurology who suggested that as long as patient's blood pressure is not lower during sedation she should be safe to undergo colonoscopy. Will put patient on clear liquid diet. Colonic prep tomorrow with plans for colonoscopy on Monday.   Ronnette Juniper, MD 12/27/2019, 9:23 AM

## 2019-12-27 NOTE — Progress Notes (Signed)
PROGRESS NOTE  Amber Barnett  [id number] DOB: 07/14/1934 DOA: 12/25/2019 PCP: Egbert Garibaldi, PA-C    Chief Complaint  Patient presents with  . Rectal Bleeding   Brief Narrative:  85-fem atrial fibrillation, DVT supposed to be on Eliquis, L breast cancer embolic left MCA stroke [id number] status post revascularization of occlusion MCA by IR She was last seen by her neurologist Dr. Leonie Man 06/10/2019 who recommended continue Xarelto statin and goal-directed therapy for secondary prevention of stroke Returns to the hospital via [facility] [place] liquid red stool Chart review reveals easier dosing with Xarelto and hence has been on that instead of Eliquis No NSAIDs no other issues  Assessment & Plan:   Active Problems:   Rectal bleeding  gib -p for scope 5/10 after prep--appreciate dr Therisa Doyne input--spoke with dr. Cheral Marker @family  request-patient at age- matched-risk of morbidity from peri-procedural hypotension as another 86 yr old without prior CVA h/o--feels can proceed with procedure hemoglobin 10-->8.3 with multiple loos dark stools today--rpt tredn in am--get ortho statics q shift x 2  Afib Cha2dvasc2<5--hold AC, holding Cardizem 120 q24  Htn--hold Exforge-prn's in oplace for htn   DVT prophylaxis: loveneox sq Code Status: full Family Communication: none + today Disposition: inpatient pending scope  Status is: Inpatient  Remains inpatient appropriate because:Ongoing active pain requiring inpatient pain management and Ongoing diagnostic testing needed not appropriate for outpatient work up   Dispo: The patient is from: Home              Anticipated d/c is to: Home              Anticipated d/c date is: 2 days              Patient currently is not medically stable to d/c.   Consultants:   gi  Procedures:   Antimicrobials:    Subjective: Pleasant in good spirits not hungry but has had multiple loose and dark stools that were reported this morning by RN Note  hemoglobin has dropped She is not having any pain She has not had vomiting  Objective: Vitals:   12/26/19 1509 12/26/19 2034 12/27/19 0543 12/27/19 1416  BP: (!) 172/94 (!) 149/79 139/71 (!) 143/74  Pulse: 69 66 (!) 48 96  Resp: 18 20 20    Temp: (!) 97.5 F (36.4 C) 98.5 F (36.9 C) 98.6 F (37 C) 98.8 F (37.1 C)  TempSrc: Oral Oral Oral Oral  SpO2: 93% 97% 99% 98%  Weight:      Height:        Intake/Output Summary (Last 24 hours) at 12/27/2019 1448 Last data filed at 12/27/2019 [id number] Gross per 24 hour  Intake 1800.91 ml  Output --  Net 1800.91 ml   Filed Weights   12/25/19 1606  Weight: 70.3 kg    Examination:  Alert coherent no distress EOMI NCAT no focal deficit Looking much younger than stated age S1-S2 no murmur--no on monitors Chest clinically clear no added sound no rales no rhonchi Abdomen slightly tender epigastrium ROM intact major muscle groups no joint swelling No lower extremity edema Psych euthymic pleasant   Data Reviewed: I have personally reviewed following labs and imaging studies Potassium 3.3 BUN/creatinine 16/0.99 saturation ratios less than 9 Hemoglobin down from 10-8.4 WBC 6.1 platelet 162  Radiology Studies: CT ABDOMEN PELVIS W CONTRAST  Result Date: 12/26/2019 CLINICAL DATA:  Rectal bleeding. EXAM: CT ABDOMEN AND PELVIS WITH CONTRAST TECHNIQUE: Multidetector CT imaging of the abdomen and pelvis was performed using the  standard protocol following bolus administration of intravenous contrast. CONTRAST:  170mL OMNIPAQUE IOHEXOL 300 MG/ML  SOLN COMPARISON:  None. FINDINGS: Lower chest: Mild enlargement of the heart with predominant enlargement of the right atrium. Small right pleural effusion. Mild linear and dependent lung base opacities, greater on the right, consistent with atelectasis. No evidence of pneumonia or pulmonary edema. Hepatobiliary: 9 mm low-attenuation lesion at the dome segment 7, nonspecific but consistent with a cyst or  hemangioma. No other liver masses or lesions. Liver normal in size and attenuation. Normal gallbladder. No bile duct dilation. Pancreas: Unremarkable. No pancreatic ductal dilatation or surrounding inflammatory changes. Spleen: Normal in size without focal abnormality. Adrenals/Urinary Tract: No adrenal masses. Kidneys normal in overall size, orientation and position with symmetric enhancement and excretion. Several low-attenuation masses, largest on the right, cortical, midpole laterally measuring 9 mm. Largest on the left posterior midpole, 1 cm. These consistent with cysts. No stones. No hydronephrosis. Normal ureters. Normal bladder Stomach/Bowel: Wall of the rectum mildly thickened. There is no defined mass or adjacent inflammation. Numerous colonic diverticula. No diverticulitis. No colonic wall thickening or other inflammatory process. Normal stomach and small bowel. No evidence of appendicitis. Vascular/Lymphatic: Aortic atherosclerosis. No aneurysm. No enlarged lymph nodes. Reproductive: Status post hysterectomy. No adnexal masses. Other: No abdominal wall hernia or abnormality. No abdominopelvic ascites. Musculoskeletal: No fracture or acute finding. No osteoblastic or osteolytic lesions. IMPRESSION: 1. Mild wall thickening of the rectum is suggested, although this could be due to lack of distension. There is no evidence of a rectal or anal mass. No adjacent inflammation. 2. No convincing acute finding within the abdomen or pelvis. 3. Numerous colonic diverticula without evidence of diverticulitis. 4. Small right pleural effusion with lung base atelectasis. Right heart predominant cardiomegaly. 5. Other chronic findings include low-density renal masses consistent with cysts and aortic atherosclerosis. Electronically Signed   By: Lajean Manes M.D.   On: 12/26/2019 19:43   DG Chest Portable 1 View  Result Date: 12/25/2019 CLINICAL DATA:  Rectal bleeding, lightheadedness EXAM: PORTABLE CHEST 1 VIEW  COMPARISON:  06/13/2019, 05/14/2018 FINDINGS: Single frontal view of the chest demonstrates an enlarged cardiac silhouette. Background emphysema again noted. No airspace disease, effusion, or pneumothorax. Subsegmental atelectasis or scarring at the lung bases. No acute bony abnormalities. IMPRESSION: 1. Bibasilar scarring or atelectasis. 2. Enlarged cardiac silhouette. 3. Background emphysema.  No acute airspace disease. Electronically Signed   By: Randa Ngo M.D.   On: 12/25/2019 17:23      Scheduled Meds: . latanoprost  1 drop Both Eyes QHS  . pantoprazole (PROTONIX) IV  40 mg Intravenous Q24H   Continuous Infusions: . lactated ringers 75 mL/hr at 12/26/19 2041     LOS: 2 days    Time spent: 36  Nita Sells, MD [facility]  To contact the attending provider between 7A-7P or the covering provider during after hours 7P-7A, please log into the web site [email or web address] and access using universal [place] password for that web site. If you do not have the password, please call the hospital operator.  12/27/2019, 2:48 PM

## 2019-12-28 LAB — CBC
HCT: 36.4 % (ref 36.0–46.0)
Hemoglobin: 11 g/dL — ABNORMAL LOW (ref 12.0–15.0)
MCH: 24.3 pg — ABNORMAL LOW (ref 26.0–34.0)
MCHC: 30.2 g/dL (ref 30.0–36.0)
MCV: 80.4 fL (ref 80.0–100.0)
Platelets: 243 10*3/uL (ref 150–400)
RBC: 4.53 MIL/uL (ref 3.87–5.11)
RDW: 18.9 % — ABNORMAL HIGH (ref 11.5–15.5)
WBC: 6.7 10*3/uL (ref 4.0–10.5)
nRBC: 0 % (ref 0.0–0.2)

## 2019-12-28 LAB — GLUCOSE, CAPILLARY
Glucose-Capillary: 83 mg/dL (ref 70–99)
Glucose-Capillary: 81 mg/dL (ref 70–99)

## 2019-12-28 MED ORDER — GLUCOSE 40 % PO GEL
1.0000 | ORAL | Status: AC
  Administered 2019-12-28: 15:00:00 37.5 g via ORAL
  Filled 2019-12-28: qty 1

## 2019-12-28 MED ORDER — AMLODIPINE BESYLATE 5 MG PO TABS
5.0000 mg | ORAL_TABLET | Freq: Every day | ORAL | Status: DC
  Administered 2019-12-28 – 2019-12-29 (×2): 5 mg via ORAL
  Filled 2019-12-28 (×2): qty 1

## 2019-12-28 MED ORDER — PEG 3350-KCL-NA BICARB-NACL 420 G PO SOLR
4000.0000 mL | Freq: Once | ORAL | Status: AC
  Administered 2019-12-28: 11:00:00 4000 mL via ORAL

## 2019-12-28 NOTE — Progress Notes (Addendum)
PROGRESS NOTE Amber Barnett  [id number] DOB: 03-Jun-1934 DOA: 12/25/2019 PCP: Egbert Garibaldi, PA-C   Chief Complaint  Patient presents with  . Rectal Bleeding   Brief Narrative:   86-fem atrial fibrillation, DVT supposed to be on Eliquis, L breast cancer embolic left MCA stroke [id number] status post revascularization of occlusion MCA by IR She was last seen by her neurologist Dr. Leonie Man 06/10/2019 who recommended continue Xarelto statin and goal-directed therapy for secondary prevention of stroke Returns to the hospital via [facility] [place] liquid red stool Chart review reveals easier dosing with Xarelto and hence has been on that instead of Eliquis No NSAIDs no other issues  Assessment & Plan:   Active Problems:   Rectal bleeding  gib -p for scope 5/10 after prep--appreciate dr Therisa Doyne input--spoke with dr. Cheral Marker @family  request-patient at age- matched-risk of morbidity from peri-procedural hypotension as another 86 yr old without prior CVA h/o--feels can proceed with procedure Procedure seems to be planned for tomorrow 5/10 by Dr. Stacie Glaze GI Repeat hemoglobin in a.m. 5/10 as dark stool seems to have stopped  Afib Cha2dvasc2<5--hold AC, holding Cardizem 120 q24 at this time  Htn--hold Exforge-blood pressure remains uncontrolled-I will start the patient on amlodipine 5 mg   DVT prophylaxis: loveneox sq Code Status: full Family Communication:  called niece [name] [phone] and discussed neuro recs with her over phone on 5/9--she agree to procedure Disposition: inpatient pending scope  Status is: Inpatient  Remains inpatient appropriate because:Ongoing active pain requiring inpatient pain management and Ongoing diagnostic testing needed not appropriate for outpatient work up   Dispo: The patient is from: Home              Anticipated d/c is to: Home              Anticipated d/c date is: 2 days              Patient currently is not medically stable to d/c.    Consultants:   gi  Procedures:   Antimicrobials:    Subjective:  Felt a little dizzy this morning-on liquid diet predominantly-sugars seem 90- 94 for the last 2 checks She is not having any further bleeding dark stool or tarry stool since yesterday She has not vomited  Objective: Vitals:   12/27/19 2042 12/28/19 0554 12/28/19 0648 12/28/19 0656  BP: (!) 146/70 (!) 160/83  (!) 158/92  Pulse: 86 85  87  Resp: 20 (!) 21 20 20   Temp: 98.6 F (37 C) 97.8 F (36.6 C)  98.2 F (36.8 C)  TempSrc: Oral Oral  Oral  SpO2: 99% 98%  97%  Weight:      Height:        Intake/Output Summary (Last 24 hours) at 12/28/2019 1415 Last data filed at 12/28/2019 1300 Gross per 24 hour  Intake 2685.72 ml  Output 4 ml  Net 2681.72 ml   Filed Weights   12/25/19 1606  Weight: 70.3 kg   Examination: Awake coherent pleasant EOMI NCAT no focal deficit S1-S2 no murmur Abdomen slightly tender epigastrium No lower extremity edema Neurologically intact all 4 limbs moving equally sensory grossly intact smile symmetric Able to sit up in bed comfortably without assistance   Data Reviewed: I have personally reviewed following labs and imaging studies  Radiology Studies: CT ABDOMEN PELVIS W CONTRAST  Result Date: 12/26/2019 CLINICAL DATA:  Rectal bleeding. EXAM: CT ABDOMEN AND PELVIS WITH CONTRAST TECHNIQUE: Multidetector CT imaging of the abdomen and pelvis was performed using  the standard protocol following bolus administration of intravenous contrast. CONTRAST:  152mL OMNIPAQUE IOHEXOL 300 MG/ML  SOLN COMPARISON:  None. FINDINGS: Lower chest: Mild enlargement of the heart with predominant enlargement of the right atrium. Small right pleural effusion. Mild linear and dependent lung base opacities, greater on the right, consistent with atelectasis. No evidence of pneumonia or pulmonary edema. Hepatobiliary: 9 mm low-attenuation lesion at the dome segment 7, nonspecific but consistent with a cyst or  hemangioma. No other liver masses or lesions. Liver normal in size and attenuation. Normal gallbladder. No bile duct dilation. Pancreas: Unremarkable. No pancreatic ductal dilatation or surrounding inflammatory changes. Spleen: Normal in size without focal abnormality. Adrenals/Urinary Tract: No adrenal masses. Kidneys normal in overall size, orientation and position with symmetric enhancement and excretion. Several low-attenuation masses, largest on the right, cortical, midpole laterally measuring 9 mm. Largest on the left posterior midpole, 1 cm. These consistent with cysts. No stones. No hydronephrosis. Normal ureters. Normal bladder Stomach/Bowel: Wall of the rectum mildly thickened. There is no defined mass or adjacent inflammation. Numerous colonic diverticula. No diverticulitis. No colonic wall thickening or other inflammatory process. Normal stomach and small bowel. No evidence of appendicitis. Vascular/Lymphatic: Aortic atherosclerosis. No aneurysm. No enlarged lymph nodes. Reproductive: Status post hysterectomy. No adnexal masses. Other: No abdominal wall hernia or abnormality. No abdominopelvic ascites. Musculoskeletal: No fracture or acute finding. No osteoblastic or osteolytic lesions. IMPRESSION: 1. Mild wall thickening of the rectum is suggested, although this could be due to lack of distension. There is no evidence of a rectal or anal mass. No adjacent inflammation. 2. No convincing acute finding within the abdomen or pelvis. 3. Numerous colonic diverticula without evidence of diverticulitis. 4. Small right pleural effusion with lung base atelectasis. Right heart predominant cardiomegaly. 5. Other chronic findings include low-density renal masses consistent with cysts and aortic atherosclerosis. Electronically Signed   By: Lajean Manes M.D.   On: 12/26/2019 19:43      Scheduled Meds: . latanoprost  1 drop Both Eyes QHS  . pantoprazole (PROTONIX) IV  40 mg Intravenous Q24H   Continuous  Infusions: . lactated ringers 75 mL/hr at 12/28/19 0600     LOS: 3 days    Time spent: [place], MD [facility]  To contact the attending provider between 7A-7P or the covering provider during after hours 7P-7A, please log into the web site [email or web address] and access using universal [place] password for that web site. If you do not have the password, please call the hospital operator.  12/28/2019, 2:15 PM

## 2019-12-28 NOTE — Progress Notes (Signed)
Subjective: Patient denies any further rectal bleeding since yesterday. She is agreeable for colonoscopy. Denies abdominal pain, nausea or vomiting.  Objective: Vital signs in last 24 hours: Temp:  [97.8 F (36.6 C)-98.8 F (37.1 C)] 98.2 F (36.8 C) (05/09 0656) Pulse Rate:  [85-96] 87 (05/09 0656) Resp:  [19-21] 20 (05/09 0656) BP: (143-160)/(70-92) 158/92 (05/09 0656) SpO2:  [97 %-99 %] 97 % (05/09 0656) Weight change:  Last BM Date: 12/27/19  PE: Lying comfortably on bed, not in distress GENERAL: Mild pallor, no icterus ABDOMEN: Soft, nondistended, nontender, normoactive bowel sounds EXTREMITIES: No deformity, no edema  Lab Results: No results found for this or any previous visit (from the past 48 hour(s)).  Studies/Results: CT ABDOMEN PELVIS W CONTRAST  Result Date: 12/26/2019 CLINICAL DATA:  Rectal bleeding. EXAM: CT ABDOMEN AND PELVIS WITH CONTRAST TECHNIQUE: Multidetector CT imaging of the abdomen and pelvis was performed using the standard protocol following bolus administration of intravenous contrast. CONTRAST:  113mL OMNIPAQUE IOHEXOL 300 MG/ML  SOLN COMPARISON:  None. FINDINGS: Lower chest: Mild enlargement of the heart with predominant enlargement of the right atrium. Small right pleural effusion. Mild linear and dependent lung base opacities, greater on the right, consistent with atelectasis. No evidence of pneumonia or pulmonary edema. Hepatobiliary: 9 mm low-attenuation lesion at the dome segment 7, nonspecific but consistent with a cyst or hemangioma. No other liver masses or lesions. Liver normal in size and attenuation. Normal gallbladder. No bile duct dilation. Pancreas: Unremarkable. No pancreatic ductal dilatation or surrounding inflammatory changes. Spleen: Normal in size without focal abnormality. Adrenals/Urinary Tract: No adrenal masses. Kidneys normal in overall size, orientation and position with symmetric enhancement and excretion. Several low-attenuation  masses, largest on the right, cortical, midpole laterally measuring 9 mm. Largest on the left posterior midpole, 1 cm. These consistent with cysts. No stones. No hydronephrosis. Normal ureters. Normal bladder Stomach/Bowel: Wall of the rectum mildly thickened. There is no defined mass or adjacent inflammation. Numerous colonic diverticula. No diverticulitis. No colonic wall thickening or other inflammatory process. Normal stomach and small bowel. No evidence of appendicitis. Vascular/Lymphatic: Aortic atherosclerosis. No aneurysm. No enlarged lymph nodes. Reproductive: Status post hysterectomy. No adnexal masses. Other: No abdominal wall hernia or abnormality. No abdominopelvic ascites. Musculoskeletal: No fracture or acute finding. No osteoblastic or osteolytic lesions. IMPRESSION: 1. Mild wall thickening of the rectum is suggested, although this could be due to lack of distension. There is no evidence of a rectal or anal mass. No adjacent inflammation. 2. No convincing acute finding within the abdomen or pelvis. 3. Numerous colonic diverticula without evidence of diverticulitis. 4. Small right pleural effusion with lung base atelectasis. Right heart predominant cardiomegaly. 5. Other chronic findings include low-density renal masses consistent with cysts and aortic atherosclerosis. Electronically Signed   By: Lajean Manes M.D.   On: 12/26/2019 19:43    Medications: I have reviewed the patient's current medications.  Assessment: Painless hematochezia-last episode of rectal bleeding was the day before Last dose of Xarelto was on Thursday Last colonoscopy over 10 years ago CAT scan showed mild wall thickening of the rectum and colonic diverticulosis  Plan: Clear liquid diet and split prep today. Plan colonoscopy tomorrow with Dr. Paulita Fujita. The risks and the benefits of the procedure were discussed with the patient in detail. She understands and verbalizes consent.  Ronnette Juniper, MD 12/28/2019, 11:17  AM

## 2019-12-29 ENCOUNTER — Encounter (HOSPITAL_COMMUNITY)
Admission: EM | Disposition: A | Discharge status: Home / Self Care | Payer: Self-pay | Source: Home / Self Care | Attending: Family Medicine

## 2019-12-29 ENCOUNTER — Inpatient Hospital Stay (HOSPITAL_COMMUNITY): Payer: Medicare HMO | Admitting: Certified Registered Nurse Anesthetist

## 2019-12-29 ENCOUNTER — Encounter (HOSPITAL_COMMUNITY): Payer: Self-pay | Admitting: Internal Medicine

## 2019-12-29 ENCOUNTER — Telehealth (HOSPITAL_COMMUNITY): Payer: Self-pay | Admitting: Neurology

## 2019-12-29 HISTORY — PX: COLONOSCOPY WITH PROPOFOL: SHX5780

## 2019-12-29 LAB — CBC WITH DIFFERENTIAL/PLATELET
Abs Immature Granulocytes: 0.02 10*3/uL (ref 0.00–0.07)
Basophils Absolute: 0 10*3/uL (ref 0.0–0.1)
Basophils Relative: 1 %
Eosinophils Absolute: 0.1 10*3/uL (ref 0.0–0.5)
Eosinophils Relative: 2 %
HCT: 32.1 % — ABNORMAL LOW (ref 36.0–46.0)
Hemoglobin: 9.9 g/dL — ABNORMAL LOW (ref 12.0–15.0)
Immature Granulocytes: 0 %
Lymphocytes Relative: 35 %
Lymphs Abs: 2.1 10*3/uL (ref 0.7–4.0)
MCH: 24.4 pg — ABNORMAL LOW (ref 26.0–34.0)
MCHC: 30.8 g/dL (ref 30.0–36.0)
MCV: 79.3 fL — ABNORMAL LOW (ref 80.0–100.0)
Monocytes Absolute: 0.8 10*3/uL (ref 0.1–1.0)
Monocytes Relative: 13 %
Neutro Abs: 2.9 10*3/uL (ref 1.7–7.7)
Neutrophils Relative %: 49 %
Platelets: 198 10*3/uL (ref 150–400)
RBC: 4.05 MIL/uL (ref 3.87–5.11)
RDW: 18.9 % — ABNORMAL HIGH (ref 11.5–15.5)
WBC: 6 10*3/uL (ref 4.0–10.5)
nRBC: 0 % (ref 0.0–0.2)

## 2019-12-29 LAB — GLUCOSE, CAPILLARY
Glucose-Capillary: 108 mg/dL — ABNORMAL HIGH (ref 70–99)
Glucose-Capillary: 114 mg/dL — ABNORMAL HIGH (ref 70–99)
Glucose-Capillary: 88 mg/dL (ref 70–99)
Glucose-Capillary: 92 mg/dL (ref 70–99)

## 2019-12-29 LAB — COMPREHENSIVE METABOLIC PANEL
ALT: 12 U/L (ref 0–44)
AST: 19 U/L (ref 15–41)
Albumin: 3.2 g/dL — ABNORMAL LOW (ref 3.5–5.0)
Alkaline Phosphatase: 43 U/L (ref 38–126)
Anion gap: 9 (ref 5–15)
BUN: <5 mg/dL — ABNORMAL LOW (ref 8–23)
CO2: 23 mmol/L (ref 22–32)
Calcium: 8.4 mg/dL — ABNORMAL LOW (ref 8.9–10.3)
Chloride: 110 mmol/L (ref 98–111)
Creatinine, Ser: 1.13 mg/dL — ABNORMAL HIGH (ref 0.44–1.00)
GFR calc Af Amer: 52 mL/min — ABNORMAL LOW (ref >60)
GFR calc non Af Amer: 45 mL/min — ABNORMAL LOW (ref >60)
Glucose, Bld: 94 mg/dL (ref 70–99)
Potassium: 3.2 mmol/L — ABNORMAL LOW (ref 3.5–5.1)
Sodium: 142 mmol/L (ref 135–145)
Total Bilirubin: 0.7 mg/dL (ref 0.3–1.2)
Total Protein: 6.4 g/dL — ABNORMAL LOW (ref 6.5–8.1)

## 2019-12-29 LAB — PROTIME-INR
INR: 1.2 (ref 0.8–1.2)
Prothrombin Time: 15 seconds (ref 11.4–15.2)

## 2019-12-29 SURGERY — COLONOSCOPY WITH PROPOFOL
Anesthesia: Monitor Anesthesia Care

## 2019-12-29 MED ORDER — SODIUM CHLORIDE 0.9 % IV SOLN: INTRAVENOUS | Status: DC

## 2019-12-29 MED ORDER — PHENYLEPHRINE 40 MCG/ML (10ML) SYRINGE FOR IV PUSH (FOR BLOOD PRESSURE SUPPORT)
PREFILLED_SYRINGE | INTRAVENOUS | Status: DC | PRN
  Administered 2019-12-29: 120 ug via INTRAVENOUS
  Administered 2019-12-29 (×2): 80 ug via INTRAVENOUS
  Administered 2019-12-29: 120 ug via INTRAVENOUS

## 2019-12-29 MED ORDER — ESMOLOL HCL 100 MG/10ML IV SOLN
INTRAVENOUS | Status: DC | PRN
  Administered 2019-12-29: 20 mg via INTRAVENOUS

## 2019-12-29 MED ORDER — PANTOPRAZOLE SODIUM 40 MG PO TBEC
40.0000 mg | DELAYED_RELEASE_TABLET | Freq: Every day | ORAL | Status: DC
  Administered 2019-12-30: 11:00:00 40 mg via ORAL
  Filled 2019-12-29: qty 1

## 2019-12-29 MED ORDER — PROPOFOL 500 MG/50ML IV EMUL
INTRAVENOUS | Status: DC | PRN
  Administered 2019-12-29: 125 ug/kg/min via INTRAVENOUS

## 2019-12-29 MED ORDER — POTASSIUM CHLORIDE 10 MEQ/100ML IV SOLN
10.0000 meq | INTRAVENOUS | Status: AC
  Administered 2019-12-29 (×2): 10 meq via INTRAVENOUS
  Filled 2019-12-29: qty 100

## 2019-12-29 MED ORDER — PROPOFOL 10 MG/ML IV BOLUS
INTRAVENOUS | Status: DC | PRN
  Administered 2019-12-29: 40 mg via INTRAVENOUS

## 2019-12-29 MED ORDER — LACTATED RINGERS IV SOLN
INTRAVENOUS | Status: DC
  Administered 2019-12-29: 12:00:00 1000 mL via INTRAVENOUS

## 2019-12-29 MED ORDER — DILTIAZEM HCL ER COATED BEADS 120 MG PO CP24
120.0000 mg | ORAL_CAPSULE | Freq: Every day | ORAL | Status: DC
  Administered 2019-12-30: 11:00:00 120 mg via ORAL
  Filled 2019-12-29: qty 1

## 2019-12-29 SURGICAL SUPPLY — 22 items

## 2019-12-29 NOTE — Transfer of Care (Signed)
Immediate Anesthesia Transfer of Care Note  Patient: Amber Barnett  Procedure(s) Performed: Procedure(s): COLONOSCOPY WITH PROPOFOL (N/A)  Patient Location: PACU  Anesthesia Type:MAC  Level of Consciousness: Patient easily awoken, sedated, comfortable, cooperative, following commands, responds to stimulation.   Airway & Oxygen Therapy: Patient spontaneously breathing, ventilating well, oxygen via simple oxygen mask.  Post-op Assessment: Report given to PACU RN, vital signs reviewed and stable, moving all extremities.   Post vital signs: Reviewed and stable.  Complications: No apparent anesthesia complications Last Vitals:  Vitals Value Taken Time  BP 92/38 12/29/19 1342  Temp    Pulse 124 12/29/19 1347  Resp 34 12/29/19 1346  SpO2 100 % 12/29/19 1347  Vitals shown include unvalidated device data.  Last Pain:  Vitals:   12/29/19 1342  TempSrc:   PainSc: 0-No pain         Complications: No apparent anesthesia complications

## 2019-12-29 NOTE — Telephone Encounter (Signed)
I received a phone call from Dr. Verlon Au from [facility] stating that the patient is admitted with a GI bleed and xarelto   needs to be held.  Patient's niece had requested that I give her a call to discuss this.  I called her at the listed cell phone number and left a message for her stating that it was okay to hold the Xarelto for a week since she did not have much of an option at this point due to her hemorrhage.

## 2019-12-29 NOTE — Progress Notes (Signed)
PT Cancellation Note  Patient Details Name: Amber Barnett MRN: [id number] DOB: Oct 17, 1933   Cancelled Treatment:    Reason Eval/Treat Not Completed: Patient at procedure or test/unavailable.    Claretha Cooper 12/29/2019, 1:41 PM  [place] Pager [phone] Office [phone]

## 2019-12-29 NOTE — Anesthesia Procedure Notes (Signed)
Procedure Name: MAC Date/Time: 12/29/2019 1:11 PM Performed by: Deliah Boston, CRNA Pre-anesthesia Checklist: Patient identified, Emergency Drugs available, Suction available and Patient being monitored Patient Re-evaluated:Patient Re-evaluated prior to induction Oxygen Delivery Method: Simple face mask Placement Confirmation: positive ETCO2 and breath sounds checked- equal and bilateral

## 2019-12-29 NOTE — Care Management Important Message (Signed)
Important Message  Patient Details IM Letter given to Roque Lias SW Case Manager to present to the Patient Name: Amber Barnett MRN: [id number] Date of Birth: [birth year]   Medicare Important Message Given:  Yes     Kerin Salen 12/29/2019, 10:12 AM

## 2019-12-29 NOTE — Anesthesia Preprocedure Evaluation (Signed)
Anesthesia Evaluation  Patient identified by MRN, date of birth, ID band Patient awake    Reviewed: Allergy & Precautions, NPO status , Patient's Chart, lab work & pertinent test results  Airway Mallampati: II  TM Distance: >3 FB Neck ROM: Full    Dental no notable dental hx. (+) Teeth Intact   Pulmonary former smoker,    Pulmonary exam normal breath sounds clear to auscultation       Cardiovascular hypertension, Pt. on medications Normal cardiovascular exam+ dysrhythmias Atrial Fibrillation  Rhythm:Regular Rate:Normal     Neuro/Psych CVA    GI/Hepatic Neg liver ROS, GERD  Medicated and Controlled,Rectal bleeding   Endo/Other  negative endocrine ROS  Renal/GU negative Renal ROS  negative genitourinary   Musculoskeletal negative musculoskeletal ROS (+)   Abdominal   Peds  Hematology  (+) anemia , Eliquis- last dose 12/25/19   Anesthesia Other Findings   Reproductive/Obstetrics                             Anesthesia Physical Anesthesia Plan  ASA: III  Anesthesia Plan: MAC   Post-op Pain Management:    Induction: Intravenous  PONV Risk Score and Plan: 3 and Propofol infusion, Ondansetron and Treatment may vary due to age or medical condition  Airway Management Planned: Natural Airway and Simple Face Mask  Additional Equipment:   Intra-op Plan:   Post-operative Plan:   Informed Consent: I have reviewed the patients History and Physical, chart, labs and discussed the procedure including the risks, benefits and alternatives for the proposed anesthesia with the patient or authorized representative who has indicated his/her understanding and acceptance.       Plan Discussed with: CRNA  Anesthesia Plan Comments:         Anesthesia Quick Evaluation

## 2019-12-29 NOTE — Anesthesia Postprocedure Evaluation (Signed)
Anesthesia Post Note  Patient: Amber Barnett  Procedure(s) Performed: COLONOSCOPY WITH PROPOFOL (N/A )     Patient location during evaluation: PACU Anesthesia Type: MAC Level of consciousness: awake and alert and oriented Pain management: pain level controlled Vital Signs Assessment: post-procedure vital signs reviewed and stable Respiratory status: spontaneous breathing, nonlabored ventilation and respiratory function stable Cardiovascular status: stable and blood pressure returned to baseline Postop Assessment: no apparent nausea or vomiting Anesthetic complications: no    Last Vitals:  Vitals:   12/29/19 1342 12/29/19 1351  BP: (!) 92/38 (!) 105/58  Pulse: 77 76  Resp: (!) 28 (!) 28  Temp: 36.7 C   SpO2: 100% 100%    Last Pain:  Vitals:   12/29/19 1351  TempSrc:   PainSc: 0-No pain                 [name]

## 2019-12-29 NOTE — Op Note (Signed)
[facility] Patient Name: Amber Barnett Procedure Date: 12/29/2019 MRN: [id number] Attending MD: Arta Silence , MD Date of Birth: 08/09/1934 CSN: [id number] Age: 86 Admit Type: Outpatient Procedure:                Colonoscopy Indications:              Last colonoscopy over 10 years ago, Hematochezia,                            Acute post hemorrhagic anemia Providers:                Arta Silence, MD, Clyde Lundborg, RN, Carmie End, RN, Jess Toney Dalton, Technician Referring MD:             [facility] Medicines:                Monitored Anesthesia Care Complications:            No immediate complications. Estimated Blood Loss:     Estimated blood loss: none. Procedure:                Pre-Anesthesia Assessment:                           - Prior to the procedure, a History and Physical                            was performed, and patient medications and                            allergies were reviewed. The patient's tolerance of                            previous anesthesia was also reviewed. The risks                            and benefits of the procedure and the sedation                            options and risks were discussed with the patient.                            All questions were answered, and informed consent                            was obtained. Prior Anticoagulants: The patient has                            taken Xarelto (rivaroxaban), last dose was 4 days                            prior to procedure. ASA Grade Assessment: III - A  patient with severe systemic disease. After                            reviewing the risks and benefits, the patient was                            deemed in satisfactory condition to undergo the                            procedure.                           After obtaining informed consent, the colonoscope                            was  passed under direct vision. Throughout the                            procedure, the patient's blood pressure, pulse, and                            oxygen saturations were monitored continuously. The                            [id number]-[id number] ([id number]) Olympus ultra slim endoscope                            was introduced through the anus and advanced to the                            the terminal ileum, with identification of the                            appendiceal orifice and IC valve. The ileocecal                            valve, appendiceal orifice, and rectum were                            photographed. The entire colon was examined. The                            colonoscopy was performed without difficulty. The                            patient tolerated the procedure well. The quality                            of the bowel preparation was good. Scope In: 1:20:36 PM Scope Out: 1:34:55 PM Scope Withdrawal Time: 0 hours 11 minutes 21 seconds  Total Procedure Duration: 0 hours 14 minutes 19 seconds  Findings:      The perianal and digital rectal examinations were normal.      Internal hemorrhoids were found during retroflexion. The hemorrhoids  were mild.      No additional abnormalities were found on retroflexion.      A few medium-mouthed diverticula were found in the sigmoid colon,       descending colon, transverse colon and ascending colon.      Colon otherwise normal; no other polyps, masses, vascular ectasias, or       inflammatory changes were seen.      The terminal ileum appeared normal.      No old or fresh blood was seen to the extent of our examination. Impression:               - Internal hemorrhoids.                           - Diverticulosis in the sigmoid colon, in the                            descending colon, in the transverse colon and in                            the ascending colon.                           - The examined portion of the ileum was  normal.                           - No ongoing bleeding seen; suspect prior bleeding                            was from colonic diverticulosis (specific site                            unseen). Moderate Sedation:      Not Applicable - Patient had care per Anesthesia. Recommendation:           - Return patient to hospital ward for ongoing care.                           - Soft diet today.                           - Continue present medications.                           - Hold Xarelto for another week (if possible),                            prior to consideration of restarting.                           - [facility] will sign-off; we can arrange outpatient                            follow-up with [place]; please call with any questions;  thank you for the consultation.                           - Return to GI office in 6 weeks. Procedure Code(s):        --- Professional ---                           [phone], Colonoscopy, flexible; diagnostic, including                            collection of specimen(s) by brushing or washing,                            when performed (separate procedure) Diagnosis Code(s):        --- Professional ---                           K64.8, Other hemorrhoids                           K92.1, Melena (includes Hematochezia)                           D62, Acute posthemorrhagic anemia                           K57.30, Diverticulosis of large intestine without                            perforation or abscess without bleeding CPT copyright 2019 [employer]. All rights reserved. The codes documented in this report are preliminary and upon coder review may  be revised to meet current compliance requirements. Arta Silence, MD 12/29/2019 1:50:01 PM This report has been signed electronically. Number of Addenda: 0

## 2019-12-29 NOTE — Progress Notes (Addendum)
PROGRESS NOTE Amber Barnett  [id number] DOB: Dec 23, 1933 DOA: 12/25/2019 PCP: Egbert Garibaldi, PA-C   Chief Complaint  Patient presents with  . Rectal Bleeding   Brief Narrative:   85-fem atrial fibrillation, DVT supposed to be on Eliquis, L breast cancer embolic left MCA stroke 99991111 status post revascularization of occlusion MCA by IR She was last seen by her neurologist Dr. Leonie Man 06/10/2019 who recommended continue Xarelto statin and goal-directed therapy for secondary prevention of stroke Returns to the hospital via [facility] [place] liquid red stool Chart review reveals easier dosing with Xarelto and hence has been on that instead of Eliquis No NSAIDs no other issues  Assessment & Plan:   Active Problems:   Rectal bleeding  gib -p for scope 5/10 pending at this time by Dr. Paulita Fujita --spoke with dr. Cheral Marker @family  request-patient at age- matched-risk of morbidity from peri-procedural hypotension as another 86 yr old without prior CVA h/o--feels can proceed with procedure as long as hypotension can be reasonably avoided Repeat hemoglobin relatively stable and no reports of dark or tarry stool  Borderline microcytic anemia-saturation ratios 9 AM for goal of 20-we will consider Feraheme in a.m.  Afib Cha2dvasc2<5--hold AC, holding Cardizem 120 q24 at this time-resume post procedure may be lower dose  Htn--hold Exforge-blood pressure remains uncontrolled-I will start the patient on amlodipine 5 mg  Mild hypokalemia Replace with 2 runs of potassium today recheck labs including this tomorrow    DVT prophylaxis: loveneox sq Code Status: full Family Communication:  called niece [name] [phone] --have mentioned will likely d/c am She requests to speak with Dr. Leonie Man, who has kindly agreed to talk with patient family Disposition: inpatient pending scope-not ready for discharge-follow-up GI input from.today    Status is: Inpatient  Remains inpatient appropriate  because:Ongoing diagnostic testing needed not appropriate for outpatient work up and Inpatient level of care appropriate due to severity of illness   Dispo: The patient is from: Home              Anticipated d/c is to: Home              Anticipated d/c date is: 2 days              Patient currently is not medically stable to d/c.   Consultants:   gi  Procedures:   Antimicrobials:    Subjective:  Dizziness resolved-has questions about why with having blood sugar checks and we have stopped them as she has not been hypoglycemic She is not in any distress at this time no fever no chills no nausea No focal deficit Moving all 4 limbs Nursing reports that stool has been clear and there is no report of dark stool She is ready for scope  Objective: Vitals:   12/28/19 2015 12/28/19 2017 12/28/19 2020 12/29/19 0400  BP: 140/75 140/80 (!) 155/84 139/80  Pulse: 91 93 (!) 104 76  Resp: 20 20 20 19   Temp: (!) 97.5 F (36.4 C)   98.6 F (37 C)  TempSrc: Oral   Oral  SpO2: 98% 98% 97% 92%  Weight:      Height:        Intake/Output Summary (Last 24 hours) at 12/29/2019 1022 Last data filed at 12/29/2019 0200 Gross per 24 hour  Intake 1498.3 ml  Output 3 ml  Net 1495.3 ml   Filed Weights   12/25/19 1606  Weight: 70.3 kg   Examination: Awake coherent pleasant younger than stated age black female  Moderate dentition S1-S2 no murmur rub or gallop Chest clear without added sound no rales no rhonchi Abdomen soft no rebound no guarding ROM intact moving 4 limbs equally without deficit    Data Reviewed: I have personally reviewed following labs and imaging studies  Hemoglobin 11.0-->9.9 Potassium down to 3.2  Radiology Studies: No results found.    Scheduled Meds: . amLODipine  5 mg Oral Daily  . latanoprost  1 drop Both Eyes QHS  . pantoprazole (PROTONIX) IV  40 mg Intravenous Q24H   Continuous Infusions: . lactated ringers 75 mL/hr at 12/29/19 0200  . potassium  chloride       LOS: 4 days    Time spent: [place], MD [facility]  To contact the attending provider between 7A-7P or the covering provider during after hours 7P-7A, please log into the web site [email or web address] and access using universal [place] password for that web site. If you do not have the password, please call the hospital operator.  12/29/2019, 10:22 AM

## 2019-12-29 NOTE — Interval H&P Note (Signed)
History and Physical Interval Note:  12/29/2019 12:53 PM  Amber Barnett  has presented today for surgery, with the diagnosis of rectal bleeding.  The various methods of treatment have been discussed with the patient and family. After consideration of risks, benefits and other options for treatment, the patient has consented to  Procedure(s): COLONOSCOPY WITH PROPOFOL (N/A) as a surgical intervention.  The patient's history has been reviewed, patient examined, no change in status, stable for surgery.  I have reviewed the patient's chart and labs.  Questions were answered to the patient's satisfaction.     Landry Dyke

## 2019-12-30 LAB — GLUCOSE, CAPILLARY
Glucose-Capillary: 100 mg/dL — ABNORMAL HIGH (ref 70–99)
Glucose-Capillary: 111 mg/dL — ABNORMAL HIGH (ref 70–99)
Glucose-Capillary: 86 mg/dL (ref 70–99)
Glucose-Capillary: 90 mg/dL (ref 70–99)

## 2019-12-30 MED ORDER — RIVAROXABAN 15 MG PO TABS: 15.0000 mg | ORAL_TABLET | Freq: Every day | ORAL | Status: DC

## 2019-12-30 NOTE — Discharge Summary (Signed)
Physician Discharge Summary  Amber Barnett [id number] DOB: [birth year] DOA: 12/25/2019  PCP: Egbert Garibaldi, PA-C  Admit date: 12/25/2019 Discharge date: 12/30/2019  Time spent: 35   Recommendations for Outpatient Follow-up:  1. Dr. Paulita Fujita has indicated will follow patient with regards to further scope versus other medication management 2. Needs CBC plus differential 1 week 3. Xarelto held till 5/18 4. Outpatient discussion  Discharge Diagnoses:  Active Problems:   Rectal bleeding   Discharge Condition: Improved  Diet recommendation: Regular  Filed Weights   12/25/19 1606  Weight: 70.3 kg    History of present illness:  85-fematrial fibrillation, DVT supposed to be on Eliquis, L breast cancer embolic left MCA stroke 99991111 status post revascularization of occlusion MCA by IR She was last seen by her neurologist Dr. Leonie Man 06/10/2019 who recommended continue Xarelto statin and goal-directed therapy for secondary prevention of stroke Returns to the hospital via med [facility] [place] liquid red stool Chart review reveals easier dosing with Xarelto and hence has been on that instead of Eliquis No NSAIDs no other issues  Hospital Course:  gib -p for scope 5/10 pending at this time by Dr. Paulita Fujita --spoke with dr. Cheral Marker @family  request-patient at age- matched-risk of morbidity from peri-procedural hypotension as another 86 yr old without prior CVA h/o--feels can proceed with procedure as long as hypotension can be reasonably avoided Colonoscopy performed by Dr. Paulita Fujita 5/12 = internal hemorrhoids, diverticulosis sigmoid descending transverse ascending-examined ileum WNL-no ongoing bleeding-impression was suspected colonic diverticulosis causing bleed Repeat hemoglobin relatively stable and no reports of dark or tarry stool Hemoglobin has stabilized she has had no bleeding-she can go home  Borderline microcytic anemia-saturation ratios 9 AM for goal of 20-we will consider Feraheme  in a.m.  Afib Cha2dvasc2<5--hold AC, holding Cardizem 120 q24 at this time-resume post procedure may be lower dose  Htn-- uncontrolled-resumed home medications Cardizem and Exforge on discharge Will need labs to determine if should continue on diuretic  Mild hypokalemia likely secondary to diuretic Resolved on discharge   Discharge Exam: Vitals:   12/30/19 0532 12/30/19 0534  BP: (!) 155/103 (!) 149/96  Pulse: (!) 102 (!) 102  Resp: 16   Temp: 98.1 F (36.7 C)   SpO2: 100%     General: Awake alert coherent no distress sitting up in bed cheerful eating and drinking tells me no stools this morning Cardiovascular: S1-S2 no murmur rub or gallop Respiratory: Clinically clear no added sound no rales no rhonchi Abdomen soft no rebound ROM intact no rash Euthymic and pleasant  Discharge Instructions   Discharge Instructions    Diet - low sodium heart healthy   Complete by: As directed    Discharge instructions   Complete by: As directed    Please do not take your Xarelto until indicated on your paperwork as you have the risk of bleeding because of stomach issues-I would recommend that you take something called a proton pump inhibitor which will prevent you from having bleeding and will coat your stomach You will need to take this at least for 1 month and or discuss with gastroenterology regarding when to stop this and timing of may be further work-up I will attached paperwork and office information for Dr. Maple Hudson case you do not hear from them please call them within 2 or 3 days however he will probably be in touch with you to schedule a follow-up At your primary care physician's office you will need some labs done please follow-up with them within 10  days-it is okay to follow-up with gastroenterology first Take care of yourself and have a good summer   Increase activity slowly   Complete by: As directed      Allergies as of 12/30/2019      Reactions   Fish-derived  Products Swelling   Facial swelling then tongue swelling   Lactuca Virosa Diarrhea   Milk-related Compounds Diarrhea   WHOLE MILK   Other Swelling   Allergy to all nuts - facial swelling, then tongue swelling   Amoxicillin Nausea And Vomiting      Medication List    STOP taking these medications   diphenhydrAMINE 25 mg capsule Commonly known as: BENADRYL     TAKE these medications   amLODipine-valsartan 5-320 MG tablet Commonly known as: EXFORGE Take 1 tablet by mouth daily.   diltiazem 120 MG 24 hr capsule Commonly known as: TIAZAC Take 120 mg by mouth daily.   famotidine 20 MG tablet Commonly known as: PEPCID Take 20 mg by mouth daily.   latanoprost 0.005 % ophthalmic solution Commonly known as: XALATAN Place 1 drop into both eyes at bedtime.   LORazepam 1 MG tablet Commonly known as: ATIVAN Take 1 mg by mouth every 8 (eight) hours as needed for anxiety.   MAGnesium-Oxide 400 (241.3 Mg) MG tablet Generic drug: magnesium oxide Take 800 mg by mouth daily.   pantoprazole 40 MG tablet Commonly known as: PROTONIX Take 40 mg by mouth daily.   Potassium 99 MG Tabs Take 99 mg by mouth every other day.   Rivaroxaban 15 MG Tabs tablet Commonly known as: XARELTO Take 1 tablet (15 mg total) by mouth daily with supper. Start taking on: Jan 06, 2020 What changed: These instructions start on Jan 06, 2020. If you are unsure what to do until then, ask your doctor or other care provider.   Vitamin D3 50 MCG (2000 UT) capsule Take 2,000 Units by mouth daily.      Allergies  Allergen Reactions  . Fish-Derived Products Swelling    Facial swelling then tongue swelling  . Lactuca Virosa Diarrhea  . Milk-Related Compounds Diarrhea    WHOLE MILK  . Other Swelling    Allergy to all nuts - facial swelling, then tongue swelling  . Amoxicillin Nausea And Vomiting      The results of significant diagnostics from this hospitalization (including imaging, microbiology,  ancillary and laboratory) are listed below for reference.    Significant Diagnostic Studies: CT ABDOMEN PELVIS W CONTRAST  Result Date: 12/26/2019 CLINICAL DATA:  Rectal bleeding. EXAM: CT ABDOMEN AND PELVIS WITH CONTRAST TECHNIQUE: Multidetector CT imaging of the abdomen and pelvis was performed using the standard protocol following bolus administration of intravenous contrast. CONTRAST:  11mL OMNIPAQUE IOHEXOL 300 MG/ML  SOLN COMPARISON:  None. FINDINGS: Lower chest: Mild enlargement of the heart with predominant enlargement of the right atrium. Small right pleural effusion. Mild linear and dependent lung base opacities, greater on the right, consistent with atelectasis. No evidence of pneumonia or pulmonary edema. Hepatobiliary: 9 mm low-attenuation lesion at the dome segment 7, nonspecific but consistent with a cyst or hemangioma. No other liver masses or lesions. Liver normal in size and attenuation. Normal gallbladder. No bile duct dilation. Pancreas: Unremarkable. No pancreatic ductal dilatation or surrounding inflammatory changes. Spleen: Normal in size without focal abnormality. Adrenals/Urinary Tract: No adrenal masses. Kidneys normal in overall size, orientation and position with symmetric enhancement and excretion. Several low-attenuation masses, largest on the right, cortical, midpole laterally measuring 9 mm.  Largest on the left posterior midpole, 1 cm. These consistent with cysts. No stones. No hydronephrosis. Normal ureters. Normal bladder Stomach/Bowel: Wall of the rectum mildly thickened. There is no defined mass or adjacent inflammation. Numerous colonic diverticula. No diverticulitis. No colonic wall thickening or other inflammatory process. Normal stomach and small bowel. No evidence of appendicitis. Vascular/Lymphatic: Aortic atherosclerosis. No aneurysm. No enlarged lymph nodes. Reproductive: Status post hysterectomy. No adnexal masses. Other: No abdominal wall hernia or abnormality. No  abdominopelvic ascites. Musculoskeletal: No fracture or acute finding. No osteoblastic or osteolytic lesions. IMPRESSION: 1. Mild wall thickening of the rectum is suggested, although this could be due to lack of distension. There is no evidence of a rectal or anal mass. No adjacent inflammation. 2. No convincing acute finding within the abdomen or pelvis. 3. Numerous colonic diverticula without evidence of diverticulitis. 4. Small right pleural effusion with lung base atelectasis. Right heart predominant cardiomegaly. 5. Other chronic findings include low-density renal masses consistent with cysts and aortic atherosclerosis. Electronically Signed   By: Lajean Manes M.D.   On: 12/26/2019 19:43   DG Chest Portable 1 View  Result Date: 12/25/2019 CLINICAL DATA:  Rectal bleeding, lightheadedness EXAM: PORTABLE CHEST 1 VIEW COMPARISON:  06/13/2019, 05/14/2018 FINDINGS: Single frontal view of the chest demonstrates an enlarged cardiac silhouette. Background emphysema again noted. No airspace disease, effusion, or pneumothorax. Subsegmental atelectasis or scarring at the lung bases. No acute bony abnormalities. IMPRESSION: 1. Bibasilar scarring or atelectasis. 2. Enlarged cardiac silhouette. 3. Background emphysema.  No acute airspace disease. Electronically Signed   By: Randa Ngo M.D.   On: 12/25/2019 17:23    Microbiology: Recent Results (from the past 240 hour(s))  Respiratory Panel by RT PCR (Flu A&B, Covid) - Nasopharyngeal Swab     Status: None   Collection Time: 12/25/19  5:58 PM   Specimen: Nasopharyngeal Swab  Result Value Ref Range Status   SARS Coronavirus 2 by RT PCR NEGATIVE NEGATIVE Final    Comment: (NOTE) SARS-CoV-2 target nucleic acids are NOT DETECTED. The SARS-CoV-2 RNA is generally detectable in upper respiratoy specimens during the acute phase of infection. The lowest concentration of SARS-CoV-2 viral copies this assay can detect is 131 copies/mL. A negative result does not  preclude SARS-Cov-2 infection and should not be used as the sole basis for treatment or other patient management decisions. A negative result may occur with  improper specimen collection/handling, submission of specimen other than nasopharyngeal swab, presence of viral mutation(s) within the areas targeted by this assay, and inadequate number of viral copies (<131 copies/mL). A negative result must be combined with clinical observations, patient history, and epidemiological information. The expected result is Negative. Fact Sheet for Patients:  PinkCheek.be Fact Sheet for Healthcare Providers:  GravelBags.it This test is not yet ap proved or cleared by the [place] FDA and  has been authorized for detection and/or diagnosis of SARS-CoV-2 by FDA under an Emergency Use Authorization (EUA). This EUA will remain  in effect (meaning this test can be used) for the duration of the COVID-19 declaration under Section 564(b)(1) of the Act, 21 U.S.C. section 360bbb-3(b)(1), unless the authorization is terminated or revoked sooner.    Influenza A by PCR NEGATIVE NEGATIVE Final   Influenza B by PCR NEGATIVE NEGATIVE Final    Comment: (NOTE) The Xpert Xpress SARS-CoV-2/FLU/RSV assay is intended as an aid in  the diagnosis of influenza from Nasopharyngeal swab specimens and  should not be used as a sole basis for treatment. Nasal  washings and  aspirates are unacceptable for Xpert Xpress SARS-CoV-2/FLU/RSV  testing. Fact Sheet for Patients: PinkCheek.be Fact Sheet for Healthcare Providers: GravelBags.it This test is not yet approved or cleared by the [place] FDA and  has been authorized for detection and/or diagnosis of SARS-CoV-2 by  FDA under an Emergency Use Authorization (EUA). This EUA will remain  in effect (meaning this test can be used) for the duration of the   Covid-19 declaration under Section 564(b)(1) of the Act, 21  U.S.C. section 360bbb-3(b)(1), unless the authorization is  terminated or revoked. Performed at [facility], [address] [place], [place] [address]      Labs: Basic Metabolic Panel: Recent Labs  Lab 12/25/19 1657 12/26/19 0518 12/29/19 0501  NA 144 142 142  K 3.3* 3.3* 3.2*  CL 112* 112* 110  CO2 25 22 23   GLUCOSE 94 90 94  BUN 16 16 <5*  CREATININE 1.09* 0.99 1.13*  CALCIUM 8.3* 8.0* 8.4*  MG  --  1.9  --    Liver Function Tests: Recent Labs  Lab 12/25/19 1657 12/29/19 0501  AST 24 19  ALT 14 12  ALKPHOS 61 43  BILITOT 0.8 0.7  PROT 7.3 6.4*  ALBUMIN 3.8 3.2*   No results for input(s): LIPASE, AMYLASE in the last 168 hours. No results for input(s): AMMONIA in the last 168 hours. CBC: Recent Labs  Lab 12/25/19 1657 12/26/19 0518 12/28/19 1456 12/29/19 0501  WBC 6.6 6.1 6.7 6.0  NEUTROABS 4.0  --   --  2.9  HGB 10.0* 8.4* 11.0* 9.9*  HCT 31.7* 27.5* 36.4 32.1*  MCV 78.3* 79.9* 80.4 79.3*  PLT 203 162 243 198   Cardiac Enzymes: No results for input(s): CKTOTAL, CKMB, CKMBINDEX, TROPONINI in the last 168 hours. BNP: BNP (last 3 results) No results for input(s): BNP in the last 8760 hours.  ProBNP (last 3 results) No results for input(s): PROBNP in the last 8760 hours.  CBG: Recent Labs  Lab 12/29/19 0800 12/29/19 2007 12/30/19 0001 12/30/19 0407 12/30/19 0756  GLUCAP 108* 114* 111* 100* 86       Signed:  Nita Sells MD   [facility] 12/30/2019, 9:30 AM

## 2019-12-30 NOTE — Progress Notes (Signed)
PT Cancellation Note  Patient Details Name: Amber Barnett MRN: [id number] DOB: 1934/01/13   Cancelled Treatment:    Reason Eval/Treat Not Completed: PT screened, no needs identified, will sign off/to DC home   Claretha Cooper 12/30/2019, 12:19 PM  Tresa Endo PT Acute Rehabilitation Services Pager [phone] Office [phone]

## 2019-12-31 ENCOUNTER — Encounter: Payer: Self-pay | Admitting: *Deleted

## 2020-06-09 ENCOUNTER — Encounter: Payer: Self-pay | Admitting: Neurology

## 2020-06-09 ENCOUNTER — Ambulatory Visit (INDEPENDENT_AMBULATORY_CARE_PROVIDER_SITE_OTHER): Payer: Medicare HMO | Admitting: Neurology

## 2020-06-09 VITALS — BP 119/78 | HR 86 | Ht 65.0 in | Wt 141.0 lb

## 2020-06-09 DIAGNOSIS — I6990 Unspecified sequelae of unspecified cerebrovascular disease: Secondary | ICD-10-CM

## 2020-06-09 NOTE — Progress Notes (Signed)
[facility] [address] [place]. [place] [address] [phone]       OFFICE FOLLOW-UP NOTE  Ms. Antoria Lanza Date of Birth:  1934-03-07 Medical Record Number:  [id number]   HP Last visit 07/23/2017 ; Ms. Balash is a pleasant 86 year old African-American lady seen today for the first office follow-up visit following hospital admission for stroke in September 2018. She is accompanied by her daughter. History is obtained from them and review of electronic medical records. I have personally reviewed imaging films.Ms. Dorion presented with left sized gaze deviation, right hemiparesis, and aphasia witness by family. The patient admitted to not being compliant with taking the second dose of eliquis every day and consistently. On presentation head CT indicated distal M1 occlusive thrombus. CTA was performed and subsequent successful revascularization by Dr. Estanislado Pandy. Subsequent MRI head showed punctate infarct without significant residual stenosis. She had no significant resultant deficits and was discharged to home with her niece for supervision and outpatient PT recommended. On review of risk factors she was noted to report missing doses of Eliquis intermittently, including right before this admission. She remained in Afib throughout her evaluation. Eliquis was discontinued and Xarelto prescribed at discharge for hopefully easier treatment adherence. Patient was counseled to be compliant with Xarelto and to take it with the proper meal consistently at the same time every day. She voiced understanding. She states she has done very well since discharge. She has no residual neurological deficits. She is tolerating Xarelto well and consistently takes it with lunch and a proper meal. She did have minor nasal bleeding a few weeks ago following a cold but that seemed to have stopped. Her blood pressure is well controlled and today it is 127/73. She does not have a any  specific neurological complaints today. She's had no recurrent stroke or TIA symptoms. She is completely independent of all active disease of daily living.  Update 01/21/2018 : She returns for f/u after last visit 6 months ago. She is accompanied by her daughter. She continues to do well without any recurrent stroke or TIA symptoms. She did have some mild rectal bleeding on xarelto 20 mg daily which has stopped after her primary MD decreased the dose to 15 mg daily which she appears to be tolerating well. Her BP is well controled and is 136/79 today.  She denies any recurrent stroke or TIA symptoms.  In fact she has no new complaints today..  She continues to have swelling in her leg she saw she saw her cardiologist recently who increased the dose of amlodipine as she was found to have pulmonary hypertension.Marland Kitchen Update 06/10/2019 : She returns for follow-up after last visit more than a year ago.  She is accompanied by her niece.  Patient continues to do well she has had no recurrent stroke or TIA symptoms.  She remains on Xarelto which is tolerating well without bleeding or bruising.  Her blood pressure is usually well controlled today it is borderline at 148/66 in office.  She states that in the last couple of months she is noted some intermittent swallowing difficulties.  She often coughs out some phlegm.  She feels sometimes she is choking on her saliva.  She denies any pain.  Her appetite is good.  She denies weight loss.  She has not discussed this complaints with the primary care physician but plans to do so soon.  She is planning on having dental extraction and has questions about holding Xarelto in the stroke risk in  the periprocedure well.  She has no other new neurological complaints. 06/09/2020 : She returns for follow-up after last visit a year ago.  She is accompanied by her daughter today.  She states she is done well from the stroke standpoint without recurrent stroke or TIA symptoms.  She had a GI  bleeding in May 2021 and had to stop the Xarelto unfortunately she developed bilateral popliteal artery embolism requiring emergent bilateral popliteal embolectomies and surgery.  She is done well since then.  She is back on Xarelto and tolerating it well without bruising or bleeding.  She has recently moved into independent living.  She also had pleural effusion and is being followed at [facility] for pulmonary hypertension.  She has no new neurological complaints today ROS:   14 system review of systems is positive for GI bleeding, lower extremity ischemia, gait difficulty and all other systems negative PMH:  Past Medical History:  Diagnosis Date  . Atrial fibrillation ([place])   . DVT (deep venous thrombosis) ([place])   . Dysrhythmia   . History of left breast cancer   . Hypertension   . Stroke [facility])     Social History:  Social History   Socioeconomic History  . Marital status: Widowed    Spouse name: Not on file  . Number of children: Not on file  . Years of education: Not on file  . Highest education level: Not on file  Occupational History  . Occupation: Retired  Tobacco Use  . Smoking status: Former Smoker    Types: Cigarettes  . Smokeless tobacco: Never Used  Vaping Use  . Vaping Use: Never used  Substance and Sexual Activity  . Alcohol use: No  . Drug use: Never  . Sexual activity: Not on file  Other Topics Concern  . Not on file  Social History Narrative   Lives alone Independent Living, [facility] in [place]   Right Handed   Drinks no caffeine   Social Determinants of Health   Financial Resource Strain:   . Difficulty of Paying Living Expenses: Not on file  Food Insecurity:   . Worried About Charity fundraiser in the Last Year: Not on file  . Ran Out of Food in the Last Year: Not on file  Transportation Needs:   . Lack of Transportation (Medical): Not on file  . Lack of Transportation (Non-Medical): Not on file  Physical Activity:   . Days of Exercise  per Week: Not on file  . Minutes of Exercise per Session: Not on file  Stress:   . Feeling of Stress : Not on file  Social Connections:   . Frequency of Communication with Friends and Family: Not on file  . Frequency of Social Gatherings with Friends and Family: Not on file  . Attends Religious Services: Not on file  . Active Member of Clubs or Organizations: Not on file  . Attends Archivist Meetings: Not on file  . Marital Status: Not on file  Intimate Partner Violence:   . Fear of Current or Ex-Partner: Not on file  . Emotionally Abused: Not on file  . Physically Abused: Not on file  . Sexually Abused: Not on file    Medications:   Current Outpatient Medications on File Prior to Visit  Medication Sig Dispense Refill  . amLODipine-valsartan (EXFORGE) 5-320 MG tablet Take 1 tablet by mouth daily.    . Cholecalciferol (VITAMIN D3) 2000 units capsule Take 2,000 Units by mouth daily.    Marland Kitchen  diltiazem (TIAZAC) 120 MG 24 hr capsule Take 120 mg by mouth daily.  6  . famotidine (PEPCID) 20 MG tablet Take 20 mg by mouth daily.    Marland Kitchen latanoprost (XALATAN) 0.005 % ophthalmic solution Place 1 drop into both eyes at bedtime.    Marland Kitchen LORazepam (ATIVAN) 1 MG tablet Take 1 mg by mouth every 8 (eight) hours as needed for anxiety.     Marland Kitchen MAGNESIUM-OXIDE 400 (241.3 Mg) MG tablet Take 800 mg by mouth daily.     . pantoprazole (PROTONIX) 40 MG tablet Take 40 mg by mouth daily.  2  . Potassium 99 MG TABS Take 99 mg by mouth every other day.     . Rivaroxaban (XARELTO) 15 MG TABS tablet Take 1 tablet (15 mg total) by mouth daily with supper. 42 tablet    No current facility-administered medications on file prior to visit.    Allergies:   Allergies  Allergen Reactions  . Fish-Derived Products Swelling    Facial swelling then tongue swelling  . Lactuca Virosa Diarrhea  . Milk-Related Compounds Diarrhea    WHOLE MILK  . Other Swelling    Allergy to all nuts - facial swelling, then tongue  swelling  . Amoxicillin Nausea And Vomiting    Physical Exam General: well developed, well nourished pleasant elderly African-American lady, seated, in no evident distress Head: head normocephalic and atraumatic.  Neck: supple with no carotid or supraclavicular bruits Cardiovascular: regular rate and rhythm, no murmurs Musculoskeletal: no deformity Skin:  no rash/petichiae bilateral lower extremity surgical scars from popliteal artery thrombectomies Vascular:  Normal pulses all extremities Vitals:   06/09/20 1246  BP: 119/78  Pulse: 86   Neurologic Exam Mental Status: Awake and fully alert. Oriented to place and time. Recent and remote memory intact. Attention span, concentration and fund of knowledge appropriate. Mood and affect appropriate.  Cranial Nerves: Fundoscopic exam not done Pupils equal, briskly reactive to light. Extraocular movements full without nystagmus. Visual fields full to confrontation. Hearing intact. Facial sensation intact.  Mild right nasolabial asymmetry when she smiles., tongue, palate moves normally and symmetrically.  Motor: Normal bulk and tone. Normal strength in all tested extremity muscles. Diminished fine finger movements on the right. Orbits left over right upper extremity. Sensory.: intact to touch ,pinprick .position and vibratory sensation.  Coordination: Rapid alternating movements normal in all extremities. Finger-to-nose and heel-to-shin performed accurately bilaterally. Gait and Station: Arises from chair without difficulty. Stance is normal. Gait demonstrates normal stride length and balance . Able to heel, toe and tandem walk with mild difficulty.  Reflexes: 1+ and symmetric. Toes downgoing.       ASSESSMENT: 86 year old lady with embolic left MCA infarct and September 2018 secondary to left middle cerebral artery occlusion due to atrial fibrillation treated with mechanical embolectomy with complete recanalization and excellent clinical  recovery.  She had a GI bleed in May 2021 and while Xarelto was held developed significant Bilateral popliteal artery embolisms: Status post bilateral popliteal artery thrombectomies, following with vascular surgery     PLAN: I had a long discussion with the patient and her daughter regarding her remote left MCA infarct from which she seems to be doing well without recurrent stroke or TIA symptoms.  She unfortunately had some GI bleeding and DVT when Xarelto was held in August 2021.  Continue Xarelto for stroke prevention for atrial fibrillation with aggressive risk factor modification with strict control of hypertension with blood pressure goal below 130/90, lipids with LDL cholesterol goal  below 70 mg percent and diabetes with hemoglobin A1c goal below 6.5%.  Check carotid ultrasound for surveillance and lipid profile and hemoglobin A1c.  She will return for follow-up in the future in a year or call earlier if necessary. Greater than 50% of time during this 25 minute visit was spent on counseling,explanation of diagnosis of embolic stroke and atrial fibrillation, planning of further management, discussion with patient and family and coordination of care Antony Contras, MD Note: This document was prepared with digital dictation and possible smart phrase technology. Any transcriptional errors that result from this process are unintentional

## 2020-06-09 NOTE — Patient Instructions (Signed)
I had a long discussion with the patient and her daughter regarding her remote left MCA infarct from which she seems to be doing well without recurrent stroke or TIA symptoms.  She unfortunately had some GI bleeding and DVT when Xarelto was held in August 2021.  Continue Xarelto for stroke prevention for atrial fibrillation with aggressive risk factor modification with strict control of hypertension with blood pressure goal below 130/90, lipids with LDL cholesterol goal below 70 mg percent and diabetes with hemoglobin A1c goal below 6.5%.  Check carotid ultrasound for surveillance and lipid profile and hemoglobin A1c.  She will return for follow-up in the future in a year or call earlier if necessary.

## 2020-06-10 LAB — HEMOGLOBIN A1C
Est. average glucose Bld gHb Est-mCnc: 111 mg/dL
Hgb A1c MFr Bld: 5.5 % (ref 4.8–5.6)

## 2020-06-10 LAB — LIPID PANEL
Chol/HDL Ratio: 2.7 ratio (ref 0.0–4.4)
Cholesterol, Total: 168 mg/dL (ref 100–199)
HDL: 62 mg/dL (ref >39)
LDL Chol Calc (NIH): 92 mg/dL (ref 0–99)
Triglycerides: 71 mg/dL (ref 0–149)
VLDL Cholesterol Cal: 14 mg/dL (ref 5–40)

## 2020-06-28 ENCOUNTER — Ambulatory Visit (HOSPITAL_COMMUNITY): Payer: Medicare HMO

## 2020-07-02 ENCOUNTER — Other Ambulatory Visit: Payer: Self-pay | Admitting: Neurology

## 2020-07-02 MED ORDER — ATORVASTATIN CALCIUM 20 MG PO TABS
20.0000 mg | ORAL_TABLET | Freq: Every day | ORAL | 11 refills | Status: DC
Start: 2020-07-02 — End: 2024-01-04

## 2020-07-02 NOTE — Progress Notes (Signed)
Kindly inform the patient that screening blood work for diabetes was satisfactory.  Cholesterol profile showed that the bad cholesterol is mildly elevated and I recommend she start taking Lipitor 20 mg daily.

## 2020-07-02 NOTE — Progress Notes (Signed)
Kindly inform the patient that lab work shows that screening test for diabetes is satisfactory but bad cholesterol is mildly elevated and recommend she start taking Lipitor 20 mg daily

## 2020-07-06 ENCOUNTER — Telehealth: Payer: Self-pay | Admitting: *Deleted

## 2020-07-06 NOTE — Telephone Encounter (Signed)
Spoke with patient and informed her that lab work shows that screening test for diabetes is satisfactory but bad cholesterol is mildly elevated and Dr Leonie Man recommends she start taking Lipitor 20 mg daily. The RX has been sent in. She had no questions, verbalized understanding, appreciation.

## 2020-08-11 ENCOUNTER — Encounter (HOSPITAL_COMMUNITY): Payer: Self-pay

## 2020-08-11 ENCOUNTER — Observation Stay (HOSPITAL_BASED_OUTPATIENT_CLINIC_OR_DEPARTMENT_OTHER)
Admission: EM | Admit: 2020-08-11 | Discharge: 2020-08-13 | Disposition: A | Discharge status: Home / Self Care | Payer: Medicare HMO | Source: Home / Self Care | Attending: Emergency Medicine | Admitting: Emergency Medicine

## 2020-08-11 ENCOUNTER — Other Ambulatory Visit: Payer: Self-pay

## 2020-08-11 DIAGNOSIS — Z7901 Long term (current) use of anticoagulants: Secondary | ICD-10-CM | POA: Insufficient documentation

## 2020-08-11 DIAGNOSIS — Z79899 Other long term (current) drug therapy: Secondary | ICD-10-CM | POA: Insufficient documentation

## 2020-08-11 DIAGNOSIS — K922 Gastrointestinal hemorrhage, unspecified: Secondary | ICD-10-CM | POA: Insufficient documentation

## 2020-08-11 DIAGNOSIS — Z87891 Personal history of nicotine dependence: Secondary | ICD-10-CM | POA: Insufficient documentation

## 2020-08-11 DIAGNOSIS — I4891 Unspecified atrial fibrillation: Secondary | ICD-10-CM | POA: Insufficient documentation

## 2020-08-11 DIAGNOSIS — Z853 Personal history of malignant neoplasm of breast: Secondary | ICD-10-CM | POA: Insufficient documentation

## 2020-08-11 DIAGNOSIS — I10 Essential (primary) hypertension: Secondary | ICD-10-CM | POA: Insufficient documentation

## 2020-08-11 DIAGNOSIS — Z20822 Contact with and (suspected) exposure to covid-19: Secondary | ICD-10-CM | POA: Insufficient documentation

## 2020-08-11 LAB — CBC WITH DIFFERENTIAL/PLATELET
Abs Immature Granulocytes: 0.01 10*3/uL (ref 0.00–0.07)
Basophils Absolute: 0 10*3/uL (ref 0.0–0.1)
Basophils Relative: 1 %
Eosinophils Absolute: 0.2 10*3/uL (ref 0.0–0.5)
Eosinophils Relative: 3 %
HCT: 30.7 % — ABNORMAL LOW (ref 36.0–46.0)
Hemoglobin: 9.6 g/dL — ABNORMAL LOW (ref 12.0–15.0)
Immature Granulocytes: 0 %
Lymphocytes Relative: 27 %
Lymphs Abs: 1.6 10*3/uL (ref 0.7–4.0)
MCH: 25.5 pg — ABNORMAL LOW (ref 26.0–34.0)
MCHC: 31.3 g/dL (ref 30.0–36.0)
MCV: 81.4 fL (ref 80.0–100.0)
Monocytes Absolute: 0.6 10*3/uL (ref 0.1–1.0)
Monocytes Relative: 9 %
Neutro Abs: 3.6 10*3/uL (ref 1.7–7.7)
Neutrophils Relative %: 60 %
Platelets: 171 10*3/uL (ref 150–400)
RBC: 3.77 MIL/uL — ABNORMAL LOW (ref 3.87–5.11)
RDW: 18.5 % — ABNORMAL HIGH (ref 11.5–15.5)
WBC: 6 10*3/uL (ref 4.0–10.5)
nRBC: 0 % (ref 0.0–0.2)

## 2020-08-11 LAB — TYPE AND SCREEN
ABO/RH(D): B POS
Antibody Screen: NEGATIVE

## 2020-08-11 LAB — COMPREHENSIVE METABOLIC PANEL
ALT: 15 U/L (ref 0–44)
AST: 21 U/L (ref 15–41)
Albumin: 3.9 g/dL (ref 3.5–5.0)
Alkaline Phosphatase: 59 U/L (ref 38–126)
Anion gap: 9 (ref 5–15)
BUN: 22 mg/dL (ref 8–23)
CO2: 24 mmol/L (ref 22–32)
Calcium: 8.6 mg/dL — ABNORMAL LOW (ref 8.9–10.3)
Chloride: 110 mmol/L (ref 98–111)
Creatinine, Ser: 1.19 mg/dL — ABNORMAL HIGH (ref 0.44–1.00)
GFR, Estimated: 45 mL/min — ABNORMAL LOW (ref >60)
Glucose, Bld: 90 mg/dL (ref 70–99)
Potassium: 4 mmol/L (ref 3.5–5.1)
Sodium: 143 mmol/L (ref 135–145)
Total Bilirubin: 0.6 mg/dL (ref 0.3–1.2)
Total Protein: 7 g/dL (ref 6.5–8.1)

## 2020-08-11 LAB — ABO/RH: ABO/RH(D): B POS

## 2020-08-11 LAB — HEMOGLOBIN AND HEMATOCRIT, BLOOD
HCT: 31 % — ABNORMAL LOW (ref 36.0–46.0)
Hemoglobin: 9.6 g/dL — ABNORMAL LOW (ref 12.0–15.0)

## 2020-08-11 LAB — PROTIME-INR
INR: 2.1 — ABNORMAL HIGH (ref 0.8–1.2)
Prothrombin Time: 23.1 seconds — ABNORMAL HIGH (ref 11.4–15.2)

## 2020-08-11 LAB — RESP PANEL BY RT-PCR (FLU A&B, COVID) ARPGX2
Influenza A by PCR: NEGATIVE
Influenza B by PCR: NEGATIVE
SARS Coronavirus 2 by RT PCR: NEGATIVE

## 2020-08-11 LAB — POC OCCULT BLOOD, ED: Fecal Occult Bld: POSITIVE — AB

## 2020-08-11 MED ORDER — PANTOPRAZOLE SODIUM 40 MG PO TBEC
40.0000 mg | DELAYED_RELEASE_TABLET | Freq: Every day | ORAL | Status: DC
  Administered 2020-08-11 – 2020-08-13 (×3): 40 mg via ORAL
  Filled 2020-08-11 (×3): qty 1

## 2020-08-11 MED ORDER — HYDROCORTISONE ACETATE 25 MG RE SUPP
25.0000 mg | Freq: Two times a day (BID) | RECTAL | Status: DC
  Administered 2020-08-11: 12:00:00 25 mg via RECTAL
  Filled 2020-08-11 (×5): qty 1

## 2020-08-11 MED ORDER — ALBUTEROL SULFATE (2.5 MG/3ML) 0.083% IN NEBU: 3.0000 mL | INHALATION_SOLUTION | Freq: Four times a day (QID) | RESPIRATORY_TRACT | Status: DC | PRN

## 2020-08-11 MED ORDER — ACETAMINOPHEN 325 MG PO TABS: 650.0000 mg | ORAL_TABLET | Freq: Four times a day (QID) | ORAL | Status: DC | PRN

## 2020-08-11 MED ORDER — LORAZEPAM 1 MG PO TABS
1.0000 mg | ORAL_TABLET | Freq: Three times a day (TID) | ORAL | Status: DC | PRN
  Administered 2020-08-11: 21:00:00 1 mg via ORAL
  Filled 2020-08-11 (×2): qty 1

## 2020-08-11 MED ORDER — AMLODIPINE BESYLATE-VALSARTAN 5-320 MG PO TABS: 1.0000 | ORAL_TABLET | Freq: Every day | ORAL | Status: DC

## 2020-08-11 MED ORDER — AMLODIPINE BESYLATE 5 MG PO TABS
5.0000 mg | ORAL_TABLET | Freq: Every day | ORAL | Status: DC
  Administered 2020-08-11 – 2020-08-13 (×3): 5 mg via ORAL
  Filled 2020-08-11 (×3): qty 1

## 2020-08-11 MED ORDER — POTASSIUM CHLORIDE CRYS ER 10 MEQ PO TBCR
10.0000 meq | EXTENDED_RELEASE_TABLET | Freq: Every day | ORAL | Status: DC
  Administered 2020-08-11 – 2020-08-13 (×3): 10 meq via ORAL
  Filled 2020-08-11 (×3): qty 1

## 2020-08-11 MED ORDER — IRBESARTAN 300 MG PO TABS
300.0000 mg | ORAL_TABLET | Freq: Every day | ORAL | Status: DC
  Administered 2020-08-11 – 2020-08-13 (×3): 300 mg via ORAL
  Filled 2020-08-11 (×3): qty 1

## 2020-08-11 MED ORDER — ONDANSETRON HCL 4 MG/2ML IJ SOLN: 4.0000 mg | Freq: Four times a day (QID) | INTRAMUSCULAR | Status: DC | PRN

## 2020-08-11 MED ORDER — LATANOPROST 0.005 % OP SOLN
1.0000 [drp] | Freq: Every day | OPHTHALMIC | Status: DC
  Administered 2020-08-11 – 2020-08-12 (×2): 1 [drp] via OPHTHALMIC
  Filled 2020-08-11: qty 2.5

## 2020-08-11 MED ORDER — ONDANSETRON HCL 4 MG PO TABS: 4.0000 mg | ORAL_TABLET | Freq: Four times a day (QID) | ORAL | Status: DC | PRN

## 2020-08-11 MED ORDER — ATORVASTATIN CALCIUM 10 MG PO TABS
20.0000 mg | ORAL_TABLET | Freq: Every day | ORAL | Status: DC
  Administered 2020-08-11 – 2020-08-12 (×2): 20 mg via ORAL
  Filled 2020-08-11 (×2): qty 2

## 2020-08-11 MED ORDER — MAGNESIUM OXIDE 400 (241.3 MG) MG PO TABS
800.0000 mg | ORAL_TABLET | Freq: Every day | ORAL | Status: DC
  Administered 2020-08-12 – 2020-08-13 (×2): 800 mg via ORAL
  Filled 2020-08-11 (×2): qty 2

## 2020-08-11 MED ORDER — FUROSEMIDE 40 MG PO TABS
20.0000 mg | ORAL_TABLET | Freq: Every day | ORAL | Status: DC
  Administered 2020-08-12 – 2020-08-13 (×2): 20 mg via ORAL
  Filled 2020-08-11 (×2): qty 1

## 2020-08-11 MED ORDER — MOMETASONE FURO-FORMOTEROL FUM 100-5 MCG/ACT IN AERO
2.0000 | INHALATION_SPRAY | Freq: Two times a day (BID) | RESPIRATORY_TRACT | Status: DC
  Administered 2020-08-11 – 2020-08-13 (×4): 2 via RESPIRATORY_TRACT
  Filled 2020-08-11: qty 8.8

## 2020-08-11 MED ORDER — MEMANTINE HCL 5 MG PO TABS
5.0000 mg | ORAL_TABLET | Freq: Two times a day (BID) | ORAL | Status: DC
  Administered 2020-08-11 – 2020-08-13 (×4): 5 mg via ORAL
  Filled 2020-08-11 (×4): qty 1

## 2020-08-11 MED ORDER — FAMOTIDINE 20 MG PO TABS
20.0000 mg | ORAL_TABLET | Freq: Every day | ORAL | Status: DC
  Administered 2020-08-11 – 2020-08-13 (×3): 20 mg via ORAL
  Filled 2020-08-11 (×3): qty 1

## 2020-08-11 MED ORDER — ACETAMINOPHEN 650 MG RE SUPP: 650.0000 mg | Freq: Four times a day (QID) | RECTAL | Status: DC | PRN

## 2020-08-11 MED ORDER — DILTIAZEM HCL ER COATED BEADS 120 MG PO CP24
120.0000 mg | ORAL_CAPSULE | Freq: Every day | ORAL | Status: DC
Start: 2020-08-12 — End: 2020-08-13
  Administered 2020-08-12 – 2020-08-13 (×2): 120 mg via ORAL
  Filled 2020-08-11 (×3): qty 1

## 2020-08-11 NOTE — Consult Note (Signed)
Referring Provider: ED Primary Care Physician:  Egbert Garibaldi, PA-C Primary Gastroenterologist:  Althia Forts  Reason for Consultation:  Rectal bleeding  HPI: Amber Barnett is a 86 y.o. female with past medical history of A fib (on Xarelto), DVT, and diverticular bleeding presenting with a chief complaint of rectal bleeding.  Patient was in her usual state of health until last evening, when she noticed bright red blood per rectum.  She then had another episode of rectal bleeding today.  States blood was red in color.  Denies any recent constipation, stating she has 1 formed but soft bowel movement daily.  Denies any melena or diarrhea.  Patient denies any abdominal pain, nausea, vomiting, hematemesis, changes in appetite, unexplained weight loss, early satiety.  Last took Xarelto yesterday morning.  Denies aspirin or NSAID use.  Colonoscopy in 12/2019 for rectal bleeding revealed internal hemorrhoids and diverticulosis.  It was noted that prior bleeding was likely from diverticulosis, though specific site was not seen.   Past Medical History:  Diagnosis Date  . Atrial fibrillation ([place])   . DVT (deep venous thrombosis) ([place])   . Dysrhythmia   . History of left breast cancer   . Hypertension   . Stroke [facility])     Past Surgical History:  Procedure Laterality Date  . COLONOSCOPY WITH PROPOFOL N/A 12/29/2019   Procedure: COLONOSCOPY WITH PROPOFOL;  Surgeon: Arta Silence, MD;  Location: [facility] END[facility]COPY;  Service: Endoscopy;  Laterality: N/A;  . IR ANGIO INTRA EXTRACRAN SEL COM CAROTID INNOMINATE UNI R MOD SED  05/18/2017  . IR ANGIO VERTEBRAL SEL SUBCLAVIAN INNOMINATE UNI R MOD SED  05/18/2017  . IR PERCUTANEOUS ART THROMBECTOMY/INFUSION INTRACRANIAL INC DIAG ANGIO  05/18/2017  . IR RADIOLOGIST EVAL & MGMT  07/19/2017  . RADIOLOGY WITH ANESTHESIA N/A 05/18/2017   Procedure: RADIOLOGY WITH ANESTHESIA;  Surgeon: Luanne Bras, MD;  Location: [facility];  Service: Radiology;   Laterality: N/A;    Prior to Admission medications   Medication Sig Start Date End Date Taking? Authorizing Provider  albuterol (VENTOLIN HFA) 108 (90 Base) MCG/ACT inhaler Inhale 2 puffs into the lungs 4 (four) times daily as needed for wheezing or shortness of breath. 03/11/20  Yes [provider]  amLODipine-valsartan (EXFORGE) 5-320 MG tablet Take 1 tablet by mouth daily. 08/17/16  Yes [provider]  atorvastatin (LIPITOR) 20 MG tablet Take 1 tablet (20 mg total) by mouth daily. 07/02/20 07/02/21 Yes Garvin Fila, MD  Cholecalciferol (VITAMIN D3) 2000 units capsule Take 2,000 Units by mouth daily.   Yes [provider]  diltiazem (TIAZAC) 120 MG 24 hr capsule Take 120 mg by mouth daily. 04/26/17  Yes [provider]  diphenhydrAMINE (BENADRYL) 25 MG tablet Take 25 mg by mouth as needed for itching or allergies.   Yes [provider]  famotidine (PEPCID) 20 MG tablet Take 20 mg by mouth daily. 11/27/19  Yes [provider]  Fe Fum-FA-B Cmp-C-Zn-Mg-Mn-Cu (HEMATINIC PLUS VIT/MINERALS) 106-1 MG TABS Take 1 tablet by mouth 2 (two) times daily. 07/26/20  Yes [provider]  furosemide (LASIX) 20 MG tablet Take 20 mg by mouth daily. MAY TAKE 2 TABLETS IF GAINS OVER 1.5 LB IN A DAY 07/01/20  Yes [provider]  latanoprost (XALATAN) 0.005 % ophthalmic solution Place 1 drop into both eyes at bedtime. 10/16/16  Yes [provider]  LORazepam (ATIVAN) 1 MG tablet Take 1 mg by mouth every 8 (eight) hours as needed for anxiety.    Yes [provider]  MAGNESIUM-OXIDE 400 (241.3 Mg) MG tablet Take 800 mg by mouth daily.  12/20/19  Yes [provider]  memantine (NAMENDA) 5 MG tablet Take 5 mg by mouth 2 (two) times daily. 07/07/20  Yes [provider]  pantoprazole (PROTONIX) 40 MG tablet Take 40 mg by mouth daily. 04/26/17  Yes [provider]  potassium chloride (MICRO-K) 10 MEQ CR capsule Take  10 mEq by mouth daily. 07/21/20  Yes [provider]  Rivaroxaban (XARELTO) 15 MG TABS tablet Take 1 tablet (15 mg total) by mouth daily with supper. 01/06/20  Yes Nita Sells, MD  SYMBICORT 80-4.5 MCG/ACT inhaler Inhale 2 puffs into the lungs 2 (two) times daily. 04/16/20  Yes [provider]    Scheduled Meds: Continuous Infusions: PRN Meds:.  Allergies as of 08/11/2020 - Review Complete 08/11/2020  Allergen Reaction Noted  . Fish-derived products Swelling 07/30/2014  . Lactuca virosa Diarrhea 02/08/2016  . Milk-related compounds Diarrhea 02/08/2016  . Other Swelling 05/18/2017  . Amoxicillin Nausea And Vomiting 01/31/2016    Family History  Problem Relation Age of Onset  . Stroke Son     Social History   Socioeconomic History  . Marital status: Widowed    Spouse name: Not on file  . Number of children: Not on file  . Years of education: Not on file  . Highest education level: Not on file  Occupational History  . Occupation: Retired  Tobacco Use  . Smoking status: Former Smoker    Types: Cigarettes  . Smokeless tobacco: Never Used  Vaping Use  . Vaping Use: Never used  Substance and Sexual Activity  . Alcohol use: No  . Drug use: Never  . Sexual activity: Not on file  Other Topics Concern  . Not on file  Social History Narrative   Lives alone Independent Living, [facility] in [place]   Right Handed   Drinks no caffeine   Social Determinants of Health   Financial Resource Strain: Not on file  Food Insecurity: Not on file  Transportation Needs: Not on file  Physical Activity: Not on file  Stress: Not on file  Social Connections: Not on file  Intimate Partner Violence: Not on file    Review of Systems: Review of Systems  Constitutional: Negative for chills, fever and weight loss.  HENT: Negative for hearing loss and tinnitus.   Eyes: Negative for pain and redness.  Respiratory: Negative for cough and shortness of breath.    Cardiovascular: Negative for chest pain and palpitations.  Gastrointestinal: Positive for blood in stool. Negative for abdominal pain, constipation, diarrhea, heartburn, melena, nausea and vomiting.  Genitourinary: Negative for flank pain and hematuria.  Musculoskeletal: Negative for falls and joint pain.  Skin: Negative for itching and rash.  Neurological: Negative for seizures and loss of consciousness.  Endo/Heme/Allergies: Negative for polydipsia. Does not bruise/bleed easily.  Psychiatric/Behavioral: Negative for substance abuse. The patient is not nervous/anxious.     Physical Exam: Vital signs: Vitals:   08/11/20 0854 08/11/20 1115  BP: 135/80 (!) 147/96  Pulse: (!) 107 (!) 118  Resp: 16 16  Temp: 98.5 F (36.9 C)   SpO2: 98% 93%      Physical Exam Vitals reviewed.  Constitutional:      General: She is not in acute distress.    Comments: Niece present  HENT:     Head: Normocephalic and atraumatic.     Nose: Nose normal. No congestion.     Mouth/Throat:  Mouth: Mucous membranes are moist.     Pharynx: Oropharynx is clear.  Eyes:     General: No scleral icterus.    Extraocular Movements: Extraocular movements intact.     Conjunctiva/sclera: Conjunctivae normal.  Cardiovascular:     Rate and Rhythm: Normal rate. Rhythm irregular.     Pulses: Normal pulses.  Pulmonary:     Effort: Pulmonary effort is normal. No respiratory distress.     Breath sounds: Normal breath sounds.  Abdominal:     General: Bowel sounds are normal. There is no distension.     Palpations: Abdomen is soft. There is no mass.     Tenderness: There is no abdominal tenderness. There is no guarding or rebound.     Hernia: No hernia is present.  Musculoskeletal:        General: No swelling or tenderness.     Cervical back: Normal range of motion and neck supple.  Skin:    General: Skin is warm and dry.  Neurological:     General: No focal deficit present.     Mental Status: She is alert  and oriented to person, place, and time.  Psychiatric:        Mood and Affect: Mood normal.        Behavior: Behavior normal. Behavior is cooperative.      GI:  Lab Results: Recent Labs    08/11/20 0905  WBC 6.0  HGB 9.6*  HCT 30.7*  PLT 171   BMET Recent Labs    08/11/20 0905  NA 143  K 4.0  CL 110  CO2 24  GLUCOSE 90  BUN 22  CREATININE 1.19*  CALCIUM 8.6*   LFT Recent Labs    08/11/20 0905  PROT 7.0  ALBUMIN 3.9  AST 21  ALT 15  ALKPHOS 59  BILITOT 0.6   PT/INR Recent Labs    08/11/20 0905  LABPROT 23.1*  INR 2.1*     Studies/Results: No results found.  Impression: Painless hematochezia in the setting of Xarelto use: Diverticular bleed versus bleeding from internal hemorrhoids -Hemoglobin 9.6, stable and improved from hemoglobin 8.9 as of 03/28/2020. -BUN 22/creatinine 1.19 -INR 2.1 today -Internal hemorrhoids and diverticulosis per colonoscopy 12/2019  A fib, on Xarelto, last dose 12/21  DVT  Plan: Initiate hydrocortisone suppositories.  Soft diet OK.  Continue to hold Xarelto today.  If no further episodes of bleeding, will plan to restart Xarelto tomorrow.  If persistent bleeding, recommend nuclear bleeding scan.  If destabilizing bleeding, recommend CT-A with IR consultation for possible embolization if positive.  Continue to monitor H&H with transfusion as needed to maintain hemoglobin greater than 7.  [facility] will follow.   LOS: 0 days   Salley Slaughter  PA-C 08/11/2020, 11:35 AM  Contact #  [phone]

## 2020-08-11 NOTE — ED Triage Notes (Signed)
Pt BIB EMS. Pt reports having dark red stools that started last night. Pt lives in independent living. Hx of GI bleeds. Hx of A-fib; pt is unsure which medications she took this morning for the A-fib.   BP-126/80 HR-90-120 in A-fib  CBG-124 Temp-98.4

## 2020-08-11 NOTE — ED Provider Notes (Signed)
[facility] DEPT Provider Note   CSN: [id number] Arrival date & time: 08/11/20  9371     History Chief Complaint  Patient presents with  . Rectal Bleeding    Amber Barnett is a 86 y.o. female.  HPI Patient takes Xarelto for history of atrial fibrillation also history of DVT.  Last dose last night.  Patient reports that yesterday she started to see some reddish material in her bowel movement.  At first she attributed to something she had eaten.  However she continued to have several more bowel movements and today she saw red backslash cranberry colored stool and some clots.  She has prior history of GI bleed.  Patient did not take her Xarelto this morning.  She does not have any associated pain.  No nausea no vomiting no upper abdominal pain.  Denies lightheadedness dizziness or near syncope.    Past Medical History:  Diagnosis Date  . Atrial fibrillation ([place])   . DVT (deep venous thrombosis) ([place])   . Dysrhythmia   . History of left breast cancer   . Hypertension   . Stroke [facility])     Patient Active Problem List   Diagnosis Date Noted  . Rectal bleeding 12/25/2019  . Current use of long term anticoagulation 05/21/2017  . Cerebral embolism with cerebral infarction ([place]) 05/18/2017  . Essential hypertension 05/08/2017  . Permanent atrial fibrillation ([place]) 05/08/2017    Past Surgical History:  Procedure Laterality Date  . COLONOSCOPY WITH PROPOFOL N/A 12/29/2019   Procedure: COLONOSCOPY WITH PROPOFOL;  Surgeon: Arta Silence, MD;  Location: [facility];  Service: Endoscopy;  Laterality: N/A;  . IR ANGIO INTRA EXTRACRAN SEL COM CAROTID INNOMINATE UNI R MOD SED  05/18/2017  . IR ANGIO VERTEBRAL SEL SUBCLAVIAN INNOMINATE UNI R MOD SED  05/18/2017  . IR PERCUTANEOUS ART THROMBECTOMY/INFUSION INTRACRANIAL INC DIAG ANGIO  05/18/2017  . IR RADIOLOGIST EVAL & MGMT  07/19/2017  . RADIOLOGY WITH ANESTHESIA N/A 05/18/2017   Procedure:  RADIOLOGY WITH ANESTHESIA;  Surgeon: Luanne Bras, MD;  Location: [facility];  Service: Radiology;  Laterality: N/A;     OB History   No obstetric history on file.     Family History  Problem Relation Age of Onset  . Stroke Son     Social History   Tobacco Use  . Smoking status: Former Smoker    Types: Cigarettes  . Smokeless tobacco: Never Used  Vaping Use  . Vaping Use: Never used  Substance Use Topics  . Alcohol use: No  . Drug use: Never    Home Medications Prior to Admission medications   Medication Sig Start Date End Date Taking? Authorizing Provider  amLODipine-valsartan (EXFORGE) 5-320 MG tablet Take 1 tablet by mouth daily. 08/17/16   [provider]  atorvastatin (LIPITOR) 20 MG tablet Take 1 tablet (20 mg total) by mouth daily. 07/02/20 07/02/21  Garvin Fila, MD  Cholecalciferol (VITAMIN D3) 2000 units capsule Take 2,000 Units by mouth daily.    [provider]  diltiazem (TIAZAC) 120 MG 24 hr capsule Take 120 mg by mouth daily. 04/26/17   [provider]  famotidine (PEPCID) 20 MG tablet Take 20 mg by mouth daily. 11/27/19   [provider]  latanoprost (XALATAN) 0.005 % ophthalmic solution Place 1 drop into both eyes at bedtime. 10/16/16   [provider]  LORazepam (ATIVAN) 1 MG tablet Take 1 mg by mouth every 8 (eight) hours as needed for anxiety.  [provider]  MAGNESIUM-OXIDE 400 (241.3 Mg) MG tablet Take 800 mg by mouth daily.  12/20/19   [provider]  pantoprazole (PROTONIX) 40 MG tablet Take 40 mg by mouth daily. 04/26/17   [provider]  Potassium 99 MG TABS Take 99 mg by mouth every other day.     [provider]  Rivaroxaban (XARELTO) 15 MG TABS tablet Take 1 tablet (15 mg total) by mouth daily with supper. 01/06/20   Nita Sells, MD    Allergies    Fish-derived products, Lactuca virosa, Milk-related compounds, Other, and Amoxicillin  Review of Systems    Review of Systems 10 systems reviewed and negative except as per HPI Physical Exam Updated Vital Signs BP 135/80 (BP Location: Left Arm)   Pulse (!) 107   Temp 98.5 F (36.9 C) (Oral)   Resp 16   SpO2 98%   Physical Exam Constitutional:      Comments: Alert and nontoxic.  Well in appearance.  Clear mental status.  No respiratory distress  Eyes:     Extraocular Movements: Extraocular movements intact.     Conjunctiva/sclera: Conjunctivae normal.  Cardiovascular:     Comments: Borderline tachycardia.  Irregularly irregular. Pulmonary:     Effort: Pulmonary effort is normal.     Breath sounds: Normal breath sounds.  Abdominal:     General: There is no distension.     Palpations: Abdomen is soft.     Tenderness: There is no abdominal tenderness. There is no guarding.  Genitourinary:    Comments: Cranberry colored bloody stool filling the rectal vault. Musculoskeletal:        General: No swelling or tenderness. Normal range of motion.     Right lower leg: No edema.     Left lower leg: No edema.  Skin:    General: Skin is warm and dry.  Neurological:     General: No focal deficit present.     Mental Status: She is oriented to person, place, and time.     Coordination: Coordination normal.  Psychiatric:        Mood and Affect: Mood normal.     ED Results / Procedures / Treatments   Labs (all labs ordered are listed, but only abnormal results are displayed) Labs Reviewed  COMPREHENSIVE METABOLIC PANEL - Abnormal; Notable for the following components:      Result Value   Creatinine, Ser 1.19 (*)    Calcium 8.6 (*)    GFR, Estimated 45 (*)    All other components within normal limits  CBC WITH DIFFERENTIAL/PLATELET - Abnormal; Notable for the following components:   RBC 3.77 (*)    Hemoglobin 9.6 (*)    HCT 30.7 (*)    MCH 25.5 (*)    RDW 18.5 (*)    All other components within normal limits  PROTIME-INR - Abnormal; Notable for the following components:    Prothrombin Time 23.1 (*)    INR 2.1 (*)    All other components within normal limits  POC OCCULT BLOOD, ED - Abnormal; Notable for the following components:   Fecal Occult Bld POSITIVE (*)    All other components within normal limits  RESP PANEL BY RT-PCR (FLU A&B, COVID) [id number]  TYPE AND SCREEN  ABO/RH    EKG None  Radiology No results found.  Procedures Procedures (including critical care time)  Medications Ordered in ED Medications - No data to display  ED Course  I have reviewed the triage vital signs  and the nursing notes.  Pertinent labs & imaging results that were available during my care of the patient were reviewed by me and considered in my medical decision making (see chart for details).  Clinical Course as of 08/11/20 1102  Wed Aug 11, 2020  1101 Consult: Internal medicine for admission Dr. Marylyn Ishihara. [MP]    Clinical Course User Index [MP] Charlesetta Shanks, MD   MDM Rules/Calculators/A&P                         Patient presents with rectal bleeding. She is anticoagulated on Xarelto. She first noted bleeding yesterday. At this time her hemoglobin is stable. Blood pressure stable. Mental status is clear. No respiratory distress. Patient has history of atrial fibrillation. Currently A. fib is asymptomatic. No chest pain, no shortness of breath, no near syncope or lightheadedness. Rectal exam does have gross blood present. Plan for admission to medical service for serial CBC, monitoring and management of anticoagulation in setting of GI bleed with atrial fibrillation  Final Clinical Impression(s) / ED Diagnoses Final diagnoses:  Lower GI bleed  Anticoagulated    Rx / DC Orders ED Discharge Orders    None       Charlesetta Shanks, MD 08/11/20 1104

## 2020-08-11 NOTE — Plan of Care (Signed)
  Problem: Education: Goal: Ability to identify signs and symptoms of gastrointestinal bleeding will improve Outcome: Progressing   Problem: Bowel/Gastric: Goal: Will show no signs and symptoms of gastrointestinal bleeding Outcome: Progressing

## 2020-08-11 NOTE — H&P (Signed)
History and Physical    Amber Barnett [id number] DOB: 09/05/1933 DOA: 08/11/2020  PCP: Egbert Garibaldi, PA-C  Patient coming from: Home  Chief Complaint: rectal bleeding  HPI: Amber Barnett is a 86 y.o. female with medical history significant of a fib and HTN.  The patient reports that last night she had a BM. She noticed that it was dark red and there was bright red blood in the toilet. She didn't think much of it and decided to go to sleep. When she woke up this morning, she had another episode of BRBPR. She decided that it was best to come to the ED. She denies any pain or syncopal type episodes.   ED Course: She was FOBT positive. EDP spoke with GI. TRH was called for admission.   Review of Systems:  Denies CP, dyspnea, ab pain, N, V. Reports some gas. Review of systems is otherwise negative for all not mentioned in HPI.   PMHx Past Medical History:  Diagnosis Date  . Atrial fibrillation ([place])   . DVT (deep venous thrombosis) ([place])   . Dysrhythmia   . History of left breast cancer   . Hypertension   . Stroke [facility])     PSHx Past Surgical History:  Procedure Laterality Date  . COLONOSCOPY WITH PROPOFOL N/A 12/29/2019   Procedure: COLONOSCOPY WITH PROPOFOL;  Surgeon: Arta Silence, MD;  [facility];  Service: Endoscopy;  Laterality: N/A;  . IR ANGIO INTRA EXTRACRAN SEL COM CAROTID INNOMINATE UNI R MOD SED  05/18/2017  . IR ANGIO VERTEBRAL SEL SUBCLAVIAN INNOMINATE UNI R MOD SED  05/18/2017  . IR PERCUTANEOUS ART THROMBECTOMY/INFUSION INTRACRANIAL INC DIAG ANGIO  05/18/2017  . IR RADIOLOGIST EVAL & MGMT  07/19/2017  . RADIOLOGY WITH ANESTHESIA N/A 05/18/2017   Procedure: RADIOLOGY WITH ANESTHESIA;  Surgeon: Luanne Bras, MD;  Location: [facility];  Service: Radiology;  Laterality: N/A;    SocHx  reports that she has quit smoking. Her smoking use included cigarettes. She has never used smokeless tobacco. She reports that she does not drink  alcohol and does not use drugs.  Allergies  Allergen Reactions  . Fish-Derived Products Swelling    Facial swelling then tongue swelling  . Lactuca Virosa Diarrhea  . Milk-Related Compounds Diarrhea    WHOLE MILK  . Other Swelling    Allergy to all nuts - facial swelling, then tongue swelling  . Amoxicillin Nausea And Vomiting    FamHx Family History  Problem Relation Age of Onset  . Stroke Son     Prior to Admission medications   Medication Sig Start Date End Date Taking? Authorizing Provider  albuterol (VENTOLIN HFA) 108 (90 Base) MCG/ACT inhaler Inhale 2 puffs into the lungs 4 (four) times daily as needed for wheezing or shortness of breath. 03/11/20  Yes [provider]  amLODipine-valsartan (EXFORGE) 5-320 MG tablet Take 1 tablet by mouth daily. 08/17/16  Yes [provider]  atorvastatin (LIPITOR) 20 MG tablet Take 1 tablet (20 mg total) by mouth daily. 07/02/20 07/02/21 Yes Garvin Fila, MD  Cholecalciferol (VITAMIN D3) 2000 units capsule Take 2,000 Units by mouth daily.   Yes [provider]  diltiazem (TIAZAC) 120 MG 24 hr capsule Take 120 mg by mouth daily. 04/26/17  Yes [provider]  diphenhydrAMINE (BENADRYL) 25 MG tablet Take 25 mg by mouth as needed for itching or allergies.   Yes [provider]  famotidine (PEPCID) 20 MG tablet Take 20 mg by mouth daily. 11/27/19  Yes [provider]  Fe Fum-FA-B Cmp-C-Zn-Mg-Mn-Cu (HEMATINIC PLUS VIT/MINERALS) 106-1 MG TABS Take 1 tablet by mouth 2 (two) times daily. 07/26/20  Yes [provider]  furosemide (LASIX) 20 MG tablet Take 20 mg by mouth daily. MAY TAKE 2 TABLETS IF GAINS OVER 1.5 LB IN A DAY 07/01/20  Yes [provider]  latanoprost (XALATAN) 0.005 % ophthalmic solution Place 1 drop into both eyes at bedtime. 10/16/16  Yes [provider]  LORazepam (ATIVAN) 1 MG tablet Take 1 mg by mouth every 8 (eight) hours as needed for anxiety.    Yes  [provider]  MAGNESIUM-OXIDE 400 (241.3 Mg) MG tablet Take 800 mg by mouth daily.  12/20/19  Yes [provider]  memantine (NAMENDA) 5 MG tablet Take 5 mg by mouth 2 (two) times daily. 07/07/20  Yes [provider]  pantoprazole (PROTONIX) 40 MG tablet Take 40 mg by mouth daily. 04/26/17  Yes [provider]  potassium chloride (MICRO-K) 10 MEQ CR capsule Take 10 mEq by mouth daily. 07/21/20  Yes [provider]  Rivaroxaban (XARELTO) 15 MG TABS tablet Take 1 tablet (15 mg total) by mouth daily with supper. 01/06/20  Yes Nita Sells, MD  SYMBICORT 80-4.5 MCG/ACT inhaler Inhale 2 puffs into the lungs 2 (two) times daily. 04/16/20  Yes [provider]    Physical Exam: Vitals:   08/11/20 0854 08/11/20 1115  BP: 135/80 (!) 147/96  Pulse: (!) 107 (!) 118  Resp: 16 16  Temp: 98.5 F (36.9 C)   TempSrc: Oral   SpO2: 98% 93%    General: 86 y.o. female resting in bed in NAD Eyes: PERRL, normal sclera ENMT: Nares patent w/o discharge, orophaynx clear, dentition normal, ears w/o discharge/lesions/ulcers Neck: Supple, trachea midline Cardiovascular: RRR, +S1, S2, no m/g/r, equal pulses throughout Respiratory: CTABL, no w/r/r, normal WOB GI: BS+, NDNT, no masses noted, no organomegaly noted MSK: No e/c/c Skin: No rashes, bruises, ulcerations noted Neuro: A&O x 3, no focal deficits Psyc: Appropriate interaction and affect, calm/cooperative  Labs on Admission: I have personally reviewed following labs and imaging studies  CBC: Recent Labs  Lab 08/11/20 0905  WBC 6.0  NEUTROABS 3.6  HGB 9.6*  HCT 30.7*  MCV 81.4  PLT [id number]   Basic Metabolic Panel: Recent Labs  Lab 08/11/20 0905  NA 143  K 4.0  CL 110  CO2 24  GLUCOSE 90  BUN 22  CREATININE 1.19*  CALCIUM 8.6*   GFR: CrCl cannot be calculated (Unknown ideal weight.). Liver Function Tests: Recent Labs  Lab 08/11/20 0905  AST 21  ALT 15  ALKPHOS 59  BILITOT  0.6  PROT 7.0  ALBUMIN 3.9   No results for input(s): LIPASE, AMYLASE in the last 168 hours. No results for input(s): AMMONIA in the last 168 hours. Coagulation Profile: Recent Labs  Lab 08/11/20 0905  INR 2.1*   Cardiac Enzymes: No results for input(s): CKTOTAL, CKMB, CKMBINDEX, TROPONINI in the last 168 hours. BNP (last 3 results) No results for input(s): PROBNP in the last 8760 hours. HbA1C: No results for input(s): HGBA1C in the last 72 hours. CBG: No results for input(s): GLUCAP in the last 168 hours. Lipid Profile: No results for input(s): CHOL, HDL, LDLCALC, TRIG, CHOLHDL, LDLDIRECT in the last 72 hours. Thyroid Function Tests: No results for input(s): TSH, T4TOTAL, FREET4, T3FREE, THYROIDAB in the last 72 hours. Anemia Panel: No results for input(s): VITAMINB12, FOLATE, FERRITIN, TIBC, IRON, RETICCTPCT in the last 72  hours. Urine analysis:    Component Value Date/Time   COLORURINE YELLOW 05/18/2017 2009   APPEARANCEUR CLEAR 05/18/2017 2009   LABSPEC 1.035 (H) 05/18/2017 2009   PHURINE 6.0 05/18/2017 2009   GLUCOSEU NEGATIVE 05/18/2017 2009   HGBUR NEGATIVE 05/18/2017 2009   BILIRUBINUR NEGATIVE 05/18/2017 2009   [place] NEGATIVE 05/18/2017 2009   PROTEINUR 100 (A) 05/18/2017 2009   UROBILINOGEN >8.0 (H) 07/19/2014 0929   NITRITE NEGATIVE 05/18/2017 2009   LEUKOCYTESUR NEGATIVE 05/18/2017 2009    Radiological Exams on Admission: No results found.  Assessment/Plan GIB     - place in obs, tele     - baseline H&H is 10 - 11; she's 9.6 at admission       - hold xarelto     - anusol     - q8h H&H     - transfuse as necessary     - GI onboard, appreciate assistance     - if stable, can consider resuming xarelto tomorrow   A fib     - hold xarelto, continue diltiazem  CKD3a     - baseline Scr is 1 - 1.1; she is at baseline     - watch nephrotoxins  HTN     - continue exforge, lasix  HLD     - continue lipitor  DVT prophylaxis: SCDs  Code  Status: Intubation  Family Communication: With niece at bedside.   Consults called: EDP spoke with GI   Status is: Observation  The patient remains OBS appropriate and will d/c before 2 midnights.  Dispo: The patient is from: Home              Anticipated d/c is to: Home              Anticipated d/c date is: 1 day              Patient currently is not medically stable to d/c.  Jonnie Finner DO [facility]  If 7PM-7AM, please contact night-coverage [email or web address]  08/11/2020, 12:15 PM

## 2020-08-12 ENCOUNTER — Observation Stay (HOSPITAL_COMMUNITY): Payer: Medicare HMO

## 2020-08-12 DIAGNOSIS — K922 Gastrointestinal hemorrhage, unspecified: Secondary | ICD-10-CM | POA: Diagnosis not present

## 2020-08-12 LAB — HEMOGLOBIN AND HEMATOCRIT, BLOOD
HCT: 28.4 % — ABNORMAL LOW (ref 36.0–46.0)
HCT: 30.7 % — ABNORMAL LOW (ref 36.0–46.0)
Hemoglobin: 8.9 g/dL — ABNORMAL LOW (ref 12.0–15.0)
Hemoglobin: 9.4 g/dL — ABNORMAL LOW (ref 12.0–15.0)

## 2020-08-12 MED ORDER — TECHNETIUM TC 99M-LABELED RED BLOOD CELLS IV KIT
22.0000 | PACK | Freq: Once | INTRAVENOUS | Status: AC
  Administered 2020-08-12: 13:00:00 22 via INTRAVENOUS

## 2020-08-12 NOTE — Progress Notes (Signed)
PROGRESS NOTE    Amber Barnett  [id number] DOB: Oct 28, 1933 DOA: 08/11/2020 PCP: Egbert Garibaldi, PA-C    Chief Complaint  Patient presents with  . Rectal Bleeding    Brief Narrative:   86 year old lady with prior history of atrial fibrillation on anticoagulation and hypertension presents to ED with recurrent rectal bleeding.  She denies any nausea vomiting or abdominal pain.  GI consulted recommended watch for recurrent bleeding.  Patient seen and examined this morning she has 2 bloody bowel movements. NM blood loss scan ordered and pending Assessment & Plan:   Active Problems:   GIB (gastrointestinal bleeding)    Recurrent rectal bleeding, painless Differential include diverticular bleed versus bleeding from hemorrhoids Mild drop in hemoglobin to 8.9.  NM blood loss scan ordered and pending. Transfuse to keep hemoglobin greater than 7. GI consulted recommended bleeding scan if persistent bleeding at this time. We will get anemia panel to check iron studies and ferritin.   Anemia of blood loss probably secondary to a diverticular bleed Transfuse to keep hemoglobin greater than 7 Hold anticoagulation until bleeding stops.   History of atrial fibrillation and DVT Rate controlled.  Anticoagulation on hold for now     Essential hypertension Blood pressure parameters appear to be optimal no changes in medications at this time  DVT prophylaxis: SCD'S Code Status: (PARTIAL .  Family Communication: none at bedside.  Disposition:   Status is: Observation  The patient will require care spanning > 2 midnights and should be moved to inpatient because: Ongoing diagnostic testing needed not appropriate for outpatient work up and Inpatient level of care appropriate due to severity of illness  Dispo: The patient is from: Home              Anticipated d/c is to: Home              Anticipated d/c date is: 1 day              Patient currently is not medically  stable to d/c.       Consultants:  Gastroenterology.  Procedures: none.   Antimicrobials: none.    Subjective: Pt reports two bloody bowel movements this am, hemoglobin drop o 8.9  Objective: Vitals:   08/11/20 2109 08/12/20 0454 08/12/20 0623 08/12/20 1042  BP: 132/67 117/72  125/64  Pulse: 73 72  95  Resp: 18 18  20   Temp: 97.9 F (36.6 C) 98 F (36.7 C)  98.3 F (36.8 C)  TempSrc:      SpO2: 99% 98% 96% 96%  Weight:      Height:        Intake/Output Summary (Last 24 hours) at 08/12/2020 1517 Last data filed at 08/12/2020 0500 Gross per 24 hour  Intake 480 ml  Output --  Net 480 ml   Filed Weights   08/11/20 1843  Weight: 64.5 kg    Examination:  General exam: Appears calm and comfortable  Respiratory system: Clear to auscultation. Respiratory effort normal. Cardiovascular system: S1 & S2 heard, RRR. No JVD, . No pedal edema. Gastrointestinal system: Abdomen is nondistended, soft and nontender.Normal bowel sounds heard. Central nervous system: Alert and oriented. No focal neurological deficits. Extremities: Symmetric 5 x 5 power. Skin: No rashes, lesions or ulcers Psychiatry: . Mood & affect appropriate.     Data Reviewed: I have personally reviewed following labs and imaging studies  CBC: Recent Labs  Lab 08/11/20 0905 08/11/20 1921 08/12/20 0217 08/12/20 1014  WBC 6.0  --   --   --  NEUTROABS 3.6  --   --   --   HGB 9.6* 9.6* 8.9* 9.4*  HCT 30.7* 31.0* 28.4* 30.7*  MCV 81.4  --   --   --   PLT 171  --   --   --     Basic Metabolic Panel: Recent Labs  Lab 08/11/20 0905  NA 143  K 4.0  CL 110  CO2 24  GLUCOSE 90  BUN 22  CREATININE 1.19*  CALCIUM 8.6*    GFR: Estimated Creatinine Clearance: 31.7 mL/min (A) (by C-G formula based on SCr of 1.19 mg/dL (H)).  Liver Function Tests: Recent Labs  Lab 08/11/20 0905  AST 21  ALT 15  ALKPHOS 59  BILITOT 0.6  PROT 7.0  ALBUMIN 3.9    CBG: No results for input(s): GLUCAP  in the last 168 hours.   Recent Results (from the past 240 hour(s))  Resp Panel by RT-PCR (Flu A&B, Covid) Nasopharyngeal Swab     Status: None   Collection Time: 08/11/20  9:44 AM   Specimen: Nasopharyngeal Swab; Nasopharyngeal(NP) swabs in vial transport medium  Result Value Ref Range Status   SARS Coronavirus 2 by RT PCR NEGATIVE NEGATIVE Final    Comment: (NOTE) SARS-CoV-2 target nucleic acids are NOT DETECTED.  The SARS-CoV-2 RNA is generally detectable in upper respiratory specimens during the acute phase of infection. The lowest concentration of SARS-CoV-2 viral copies this assay can detect is 138 copies/mL. A negative result does not preclude SARS-Cov-2 infection and should not be used as the sole basis for treatment or other patient management decisions. A negative result may occur with  improper specimen collection/handling, submission of specimen other than nasopharyngeal swab, presence of viral mutation(s) within the areas targeted by this assay, and inadequate number of viral copies(<138 copies/mL). A negative result must be combined with clinical observations, patient history, and epidemiological information. The expected result is Negative.  Fact Sheet for Patients:  EntrepreneurPulse.com.au  Fact Sheet for Healthcare Providers:  IncredibleEmployment.be  This test is no t yet approved or cleared by the [place] FDA and  has been authorized for detection and/or diagnosis of SARS-CoV-2 by FDA under an Emergency Use Authorization (EUA). This EUA will remain  in effect (meaning this test can be used) for the duration of the COVID-19 declaration under Section 564(b)(1) of the Act, 21 U.S.C.section 360bbb-3(b)(1), unless the authorization is terminated  or revoked sooner.       Influenza A by PCR NEGATIVE NEGATIVE Final   Influenza B by PCR NEGATIVE NEGATIVE Final    Comment: (NOTE) The Xpert Xpress SARS-CoV-2/FLU/RSV plus  assay is intended as an aid in the diagnosis of influenza from Nasopharyngeal swab specimens and should not be used as a sole basis for treatment. Nasal washings and aspirates are unacceptable for Xpert Xpress SARS-CoV-2/FLU/RSV testing.  Fact Sheet for Patients: EntrepreneurPulse.com.au  Fact Sheet for Healthcare Providers: IncredibleEmployment.be  This test is not yet approved or cleared by the [place] FDA and has been authorized for detection and/or diagnosis of SARS-CoV-2 by FDA under an Emergency Use Authorization (EUA). This EUA will remain in effect (meaning this test can be used) for the duration of the COVID-19 declaration under Section 564(b)(1) of the Act, 21 U.S.C. section 360bbb-3(b)(1), unless the authorization is terminated or revoked.  Performed at [facility], [place] [address], [place], [place] [address]          Radiology Studies: NM GI Blood Loss  Result Date: 08/12/2020 CLINICAL  DATA:  Lower GI bleed EXAM: NUCLEAR MEDICINE GASTROINTESTINAL BLEEDING SCAN TECHNIQUE: Sequential abdominal images were obtained following intravenous administration of Tc-19m labeled red blood cells. RADIOPHARMACEUTICALS:  22.0 mCi Tc-31m pertechnetate in-vitro labeled red cells. COMPARISON:  None FINDINGS: Following the IV administration of the radiopharmaceutical dynamic imaging was performed for 1 hour and 20 minutes. No signs of active GI bleed identified. IMPRESSION: 1. Negative for active GI bleed. Electronically Signed   By: Kerby Moors M.D.   On: 08/12/2020 14:53        Scheduled Meds: . amLODipine  5 mg Oral Daily   And  . irbesartan  300 mg Oral Daily  . atorvastatin  20 mg Oral QHS  . diltiazem  120 mg Oral Daily  . famotidine  20 mg Oral Daily  . furosemide  20 mg Oral Daily  . hydrocortisone  25 mg Rectal BID  . latanoprost  1 drop Both Eyes QHS  . magnesium oxide  800 mg Oral Daily  . memantine   5 mg Oral BID  . mometasone-formoterol  2 puff Inhalation BID  . pantoprazole  40 mg Oral Daily  . potassium chloride  10 mEq Oral Daily   Continuous Infusions:   LOS: 0 days    Time spent: 31 minutes.     Hosie Poisson, MD [facility]   To contact the attending provider between 7A-7P or the covering provider during after hours 7P-7A, please log into the web site [email or web address] and access using universal [place] password for that web site. If you do not have the password, please call the hospital operator.  08/12/2020, 3:17 PM

## 2020-08-12 NOTE — Progress Notes (Signed)
[facility] Gastroenterology Progress Note  Amber Barnett 86 y.o. May 20, 1934  CC: Rectal bleeding   Subjective: Patient seen and examined at bedside.  Had few episodes of rectal bleeding mostly old blood clots according to patient.  Denies any associated abdominal pain, nausea or vomiting.  ROS : Afebrile, negative for chest pain   Objective: Vital signs in last 24 hours: Vitals:   08/12/20 0454 08/12/20 0623  BP: 117/72   Pulse: 72   Resp: 18   Temp: 98 F (36.7 C)   SpO2: 98% 96%    Physical Exam:  General:  Alert, cooperative, no distress, appears stated age  Head:  Normocephalic, without obvious abnormality, atraumatic  Eyes:  , EOM's intact,   Lungs:    Anterior exam only.  No respiratory distress noted  Heart:  Regular rate and rhythm, S1, S2 normal  Abdomen:   Soft, non-tender, nondistended, bowel sounds present  Psych  mood and affect normal  Neuro  alert and oriented x3    Lab Results: Recent Labs    08/11/20 0905  NA 143  K 4.0  CL 110  CO2 24  GLUCOSE 90  BUN 22  CREATININE 1.19*  CALCIUM 8.6*   Recent Labs    08/11/20 0905  AST 21  ALT 15  ALKPHOS 59  BILITOT 0.6  PROT 7.0  ALBUMIN 3.9   Recent Labs    08/11/20 0905 08/11/20 1921 08/12/20 0217  WBC 6.0  --   --   NEUTROABS 3.6  --   --   HGB 9.6* 9.6* 8.9*  HCT 30.7* 31.0* 28.4*  MCV 81.4  --   --   PLT 171  --   --    Recent Labs    08/11/20 0905  LABPROT 23.1*  INR 2.1*       Assessment  --------------------  -Painless hematochezia.  Most likely diverticular bleed versus hemorrhoidal bleed. Colonoscopy in May 2021 for evaluation of rectal bleeding showed internal hemorrhoids and diverticulosis.   -Atrial fibrillation and DVT.  Xarelto on hold.    Recommendations  -------------------------  -Patient had few episodes of rectal bleeding most likely old blood clots.  Mild drop in hemoglobin noted.  If evidence of overt bleeding, recommend bleeding scan for  further evaluation. -Continue to hold Xarelto today -Continue soft diet -Repeat CBC in the morning -GI will follow   Otis Brace MD, FACP 08/12/2020, 8:52 AM  Contact #  [phone]

## 2020-08-13 DIAGNOSIS — K922 Gastrointestinal hemorrhage, unspecified: Secondary | ICD-10-CM | POA: Diagnosis not present

## 2020-08-13 LAB — IRON AND TIBC
Iron: 37 ug/dL (ref 28–170)
Saturation Ratios: 11 % (ref 10.4–31.8)
TIBC: 335 ug/dL (ref 250–450)
UIBC: 298 ug/dL

## 2020-08-13 LAB — BASIC METABOLIC PANEL
Anion gap: 9 (ref 5–15)
BUN: 22 mg/dL (ref 8–23)
CO2: 23 mmol/L (ref 22–32)
Calcium: 8.7 mg/dL — ABNORMAL LOW (ref 8.9–10.3)
Chloride: 106 mmol/L (ref 98–111)
Creatinine, Ser: 1.09 mg/dL — ABNORMAL HIGH (ref 0.44–1.00)
GFR, Estimated: 50 mL/min — ABNORMAL LOW (ref >60)
Glucose, Bld: 95 mg/dL (ref 70–99)
Potassium: 4 mmol/L (ref 3.5–5.1)
Sodium: 138 mmol/L (ref 135–145)

## 2020-08-13 LAB — FOLATE: Folate: 37.8 ng/mL (ref >5.9)

## 2020-08-13 LAB — VITAMIN B12: Vitamin B-12: 292 pg/mL (ref 180–914)

## 2020-08-13 LAB — RETICULOCYTES
Immature Retic Fract: 9.6 % (ref 2.3–15.9)
RBC.: 3.64 MIL/uL — ABNORMAL LOW (ref 3.87–5.11)
Retic Count, Absolute: 57.1 10*3/uL (ref 19.0–186.0)
Retic Ct Pct: 1.6 % (ref 0.4–3.1)

## 2020-08-13 LAB — CBC
HCT: 29.5 % — ABNORMAL LOW (ref 36.0–46.0)
Hemoglobin: 9.4 g/dL — ABNORMAL LOW (ref 12.0–15.0)
MCH: 25.5 pg — ABNORMAL LOW (ref 26.0–34.0)
MCHC: 31.9 g/dL (ref 30.0–36.0)
MCV: 80.2 fL (ref 80.0–100.0)
Platelets: 171 10*3/uL (ref 150–400)
RBC: 3.68 MIL/uL — ABNORMAL LOW (ref 3.87–5.11)
RDW: 18.2 % — ABNORMAL HIGH (ref 11.5–15.5)
WBC: 6.7 10*3/uL (ref 4.0–10.5)
nRBC: 0 % (ref 0.0–0.2)

## 2020-08-13 LAB — FERRITIN: Ferritin: 23 ng/mL (ref 11–307)

## 2020-08-13 MED ORDER — HYDROCORTISONE ACETATE 25 MG RE SUPP: 25.0000 mg | Freq: Two times a day (BID) | RECTAL | 0 refills | Status: DC

## 2020-08-13 MED ORDER — RIVAROXABAN 15 MG PO TABS: 15.0000 mg | ORAL_TABLET | Freq: Every day | ORAL | Status: DC

## 2020-08-13 NOTE — Discharge Summary (Signed)
Physician Discharge Summary  Amber Barnett [id number] DOB: [birth year] DOA: 08/11/2020  PCP: Egbert Garibaldi, PA-C  Admit date: 08/11/2020 Discharge date: 08/13/2020  Admitted From: Home.  Disposition: Home.   Recommendations for Outpatient Follow-up:  1. Follow up with PCP in 1-2 weeks 2. Please obtain BMP/CBC in one week Please follow up with gastroenterology if recurrent bleeding occurs.  Please resume xarelto tomorrow.   Discharge Condition; stable.  CODE STATUS:PARTIAL CODE.  Diet recommendation: Heart Healthy   Brief/Interim Summary: 86 year old lady with prior history of atrial fibrillation on anticoagulation and hypertension presents to ED with recurrent rectal bleeding.  She denies any nausea vomiting or abdominal pain.  GI consulted recommended watch for recurrent bleeding.  o bleeding overnight and NM blood loss is negative.   Discharge Diagnoses:  Active Problems:   GIB (gastrointestinal bleeding)  Recurrent rectal bleeding, painless Differential include diverticular bleed versus bleeding from hemorrhoids Mild drop in hemoglobin to 8.9.  NM blood loss scan ordered and  And is negative.  Transfuse to keep hemoglobin greater than 7. Adequate iron levels.  No more bleeding and hemoglobin stable around 9.4.    Anemia of blood loss probably secondary to a diverticular bleed Transfuse to keep hemoglobin greater than 7    History of atrial fibrillation and DVT Rate controlled.  Restart the xarelto tomorrow.    Discharge Instructions  Discharge Instructions    Diet - low sodium heart healthy   Complete by: As directed    Discharge instructions   Complete by: As directed    Please follow up with PCP in one week.     Allergies as of 08/13/2020      Reactions   Fish-derived Products Swelling   Facial swelling then tongue swelling   Lactuca Virosa Diarrhea   Milk-related Compounds Diarrhea   WHOLE MILK   Other Swelling   Allergy to all  nuts - facial swelling, then tongue swelling   Amoxicillin Nausea And Vomiting      Medication List    TAKE these medications   albuterol 108 (90 Base) MCG/ACT inhaler Commonly known as: VENTOLIN HFA Inhale 2 puffs into the lungs 4 (four) times daily as needed for wheezing or shortness of breath.   amLODipine-valsartan 5-320 MG tablet Commonly known as: EXFORGE Take 1 tablet by mouth daily.   atorvastatin 20 MG tablet Commonly known as: Lipitor Take 1 tablet (20 mg total) by mouth daily.   diltiazem 120 MG 24 hr capsule Commonly known as: TIAZAC Take 120 mg by mouth daily.   diphenhydrAMINE 25 MG tablet Commonly known as: BENADRYL Take 25 mg by mouth as needed for itching or allergies.   famotidine 20 MG tablet Commonly known as: PEPCID Take 20 mg by mouth daily.   furosemide 20 MG tablet Commonly known as: LASIX Take 20 mg by mouth daily. MAY TAKE 2 TABLETS IF GAINS OVER 1.5 LB IN A DAY   Hematinic Plus Vit/Minerals 106-1 MG Tabs Take 1 tablet by mouth 2 (two) times daily.   hydrocortisone 25 MG suppository Commonly known as: ANUSOL-HC Place 1 suppository (25 mg total) rectally 2 (two) times daily.   latanoprost 0.005 % ophthalmic solution Commonly known as: XALATAN Place 1 drop into both eyes at bedtime.   LORazepam 1 MG tablet Commonly known as: ATIVAN Take 1 mg by mouth every 8 (eight) hours as needed for anxiety.   MAGnesium-Oxide 400 (241.3 Mg) MG tablet Generic drug: magnesium oxide Take 800 mg by mouth daily.   memantine  5 MG tablet Commonly known as: NAMENDA Take 5 mg by mouth 2 (two) times daily.   pantoprazole 40 MG tablet Commonly known as: PROTONIX Take 40 mg by mouth daily.   potassium chloride 10 MEQ CR capsule Commonly known as: MICRO-K Take 10 mEq by mouth daily.   Rivaroxaban 15 MG Tabs tablet Commonly known as: XARELTO Take 1 tablet (15 mg total) by mouth daily with supper. Start taking on: August 14, 2020   Symbicort 80-4.5  MCG/ACT inhaler Generic drug: budesonide-formoterol Inhale 2 puffs into the lungs 2 (two) times daily.   Vitamin D3 50 MCG (2000 UT) capsule Take 2,000 Units by mouth daily.       Follow-up Information    [place], Eather Colas. Schedule an appointment as soon as possible for a visit in 1 week(s).   Specialty: Internal Medicine Contact information: [address] [place] [address] [phone]              Allergies  Allergen Reactions  . Fish-Derived Products Swelling    Facial swelling then tongue swelling  . Lactuca Virosa Diarrhea  . Milk-Related Compounds Diarrhea    WHOLE MILK  . Other Swelling    Allergy to all nuts - facial swelling, then tongue swelling  . Amoxicillin Nausea And Vomiting    Consultations:  Gastroenterology.    Procedures/Studies: NM GI Blood Loss  Result Date: 08/12/2020 CLINICAL DATA:  Lower GI bleed EXAM: NUCLEAR MEDICINE GASTROINTESTINAL BLEEDING SCAN TECHNIQUE: Sequential abdominal images were obtained following intravenous administration of Tc-87m labeled red blood cells. RADIOPHARMACEUTICALS:  22.0 mCi Tc-56m pertechnetate in-vitro labeled red cells. COMPARISON:  None FINDINGS: Following the IV administration of the radiopharmaceutical dynamic imaging was performed for 1 hour and 20 minutes. No signs of active GI bleed identified. IMPRESSION: 1. Negative for active GI bleed. Electronically Signed   By: Kerby Moors M.D.   On: 08/12/2020 14:53     Subjective:  No new complaints.  Discharge Exam: Vitals:   08/13/20 0829 08/13/20 0954  BP:  131/64  Pulse:  89  Resp:  16  Temp:  (!) 97.5 F (36.4 C)  SpO2: 99% 98%   Vitals:   08/12/20 2106 08/13/20 0520 08/13/20 0829 08/13/20 0954  BP: 123/65 124/72  131/64  Pulse: 87 73  89  Resp: 18 18  16   Temp: 98.2 F (36.8 C) 98.2 F (36.8 C)  (!) 97.5 F (36.4 C)  TempSrc: Oral Oral    SpO2: 99% 100% 99% 98%  Weight:      Height:        General: Pt is alert, awake, not  in acute distress Cardiovascular: RRR, S1/S2 +, no rubs, no gallops Respiratory: CTA bilaterally, no wheezing, no rhonchi Abdominal: Soft, NT, ND, bowel sounds + Extremities: no edema, no cyanosis    The results of significant diagnostics from this hospitalization (including imaging, microbiology, ancillary and laboratory) are listed below for reference.     Microbiology: Recent Results (from the past 240 hour(s))  Resp Panel by RT-PCR (Flu A&B, Covid) Nasopharyngeal Swab     Status: None   Collection Time: 08/11/20  9:44 AM   Specimen: Nasopharyngeal Swab; Nasopharyngeal(NP) swabs in vial transport medium  Result Value Ref Range Status   SARS Coronavirus 2 by RT PCR NEGATIVE NEGATIVE Final    Comment: (NOTE) SARS-CoV-2 target nucleic acids are NOT DETECTED.  The SARS-CoV-2 RNA is generally detectable in upper respiratory specimens during the acute phase of infection. The lowest concentration  of SARS-CoV-2 viral copies this assay can detect is 138 copies/mL. A negative result does not preclude SARS-Cov-2 infection and should not be used as the sole basis for treatment or other patient management decisions. A negative result may occur with  improper specimen collection/handling, submission of specimen other than nasopharyngeal swab, presence of viral mutation(s) within the areas targeted by this assay, and inadequate number of viral copies(<138 copies/mL). A negative result must be combined with clinical observations, patient history, and epidemiological information. The expected result is Negative.  Fact Sheet for Patients:  EntrepreneurPulse.com.au  Fact Sheet for Healthcare Providers:  IncredibleEmployment.be  This test is no t yet approved or cleared by the [place] FDA and  has been authorized for detection and/or diagnosis of SARS-CoV-2 by FDA under an Emergency Use Authorization (EUA). This EUA will remain  in effect (meaning  this test can be used) for the duration of the COVID-19 declaration under Section 564(b)(1) of the Act, 21 U.S.C.section 360bbb-3(b)(1), unless the authorization is terminated  or revoked sooner.       Influenza A by PCR NEGATIVE NEGATIVE Final   Influenza B by PCR NEGATIVE NEGATIVE Final    Comment: (NOTE) The Xpert Xpress SARS-CoV-2/FLU/RSV plus assay is intended as an aid in the diagnosis of influenza from Nasopharyngeal swab specimens and should not be used as a sole basis for treatment. Nasal washings and aspirates are unacceptable for Xpert Xpress SARS-CoV-2/FLU/RSV testing.  Fact Sheet for Patients: EntrepreneurPulse.com.au  Fact Sheet for Healthcare Providers: IncredibleEmployment.be  This test is not yet approved or cleared by the [place] FDA and has been authorized for detection and/or diagnosis of SARS-CoV-2 by FDA under an Emergency Use Authorization (EUA). This EUA will remain in effect (meaning this test can be used) for the duration of the COVID-19 declaration under Section 564(b)(1) of the Act, 21 U.S.C. section 360bbb-3(b)(1), unless the authorization is terminated or revoked.  Performed at [facility], [place] [address], [place], [place] [address]      Labs: BNP (last 3 results) No results for input(s): BNP in the last 8760 hours. Basic Metabolic Panel: Recent Labs  Lab 08/11/20 0905 08/13/20 0440  NA 143 138  K 4.0 4.0  CL 110 106  CO2 24 23  GLUCOSE 90 95  BUN 22 22  CREATININE 1.19* 1.09*  CALCIUM 8.6* 8.7*   Liver Function Tests: Recent Labs  Lab 08/11/20 0905  AST 21  ALT 15  ALKPHOS 59  BILITOT 0.6  PROT 7.0  ALBUMIN 3.9   No results for input(s): LIPASE, AMYLASE in the last 168 hours. No results for input(s): AMMONIA in the last 168 hours. CBC: Recent Labs  Lab 08/11/20 0905 08/11/20 1921 08/12/20 0217 08/12/20 1014 08/13/20 0440  WBC 6.0  --   --   --  6.7   NEUTROABS 3.6  --   --   --   --   HGB 9.6* 9.6* 8.9* 9.4* 9.4*  HCT 30.7* 31.0* 28.4* 30.7* 29.5*  MCV 81.4  --   --   --  80.2  PLT 171  --   --   --  171   Cardiac Enzymes: No results for input(s): CKTOTAL, CKMB, CKMBINDEX, TROPONINI in the last 168 hours. BNP: Invalid input(s): POCBNP CBG: No results for input(s): GLUCAP in the last 168 hours. D-Dimer No results for input(s): DDIMER in the last 72 hours. Hgb A1c No results for input(s): HGBA1C in the last 72 hours. Lipid Profile No results for  input(s): CHOL, HDL, LDLCALC, TRIG, CHOLHDL, LDLDIRECT in the last 72 hours. Thyroid function studies No results for input(s): TSH, T4TOTAL, T3FREE, THYROIDAB in the last 72 hours.  Invalid input(s): FREET3 Anemia work up Recent Labs    08/13/20 0440  VITAMINB12 292  FOLATE 37.8  FERRITIN 23  TIBC 335  IRON 37  RETICCTPCT 1.6   Urinalysis    Component Value Date/Time   COLORURINE YELLOW 05/18/2017 2009   APPEARANCEUR CLEAR 05/18/2017 2009   LABSPEC 1.035 (H) 05/18/2017 2009   PHURINE 6.0 05/18/2017 2009   GLUCOSEU NEGATIVE 05/18/2017 2009   HGBUR NEGATIVE 05/18/2017 2009   BILIRUBINUR NEGATIVE 05/18/2017 2009   [place] NEGATIVE 05/18/2017 2009   PROTEINUR 100 (A) 05/18/2017 2009   UROBILINOGEN >8.0 (H) 07/19/2014 0929   NITRITE NEGATIVE 05/18/2017 2009   LEUKOCYTESUR NEGATIVE 05/18/2017 2009   Sepsis Labs Invalid input(s): PROCALCITONIN,  WBC,  LACTICIDVEN Microbiology Recent Results (from the past 240 hour(s))  Resp Panel by RT-PCR (Flu A&B, Covid) Nasopharyngeal Swab     Status: None   Collection Time: 08/11/20  9:44 AM   Specimen: Nasopharyngeal Swab; Nasopharyngeal(NP) swabs in vial transport medium  Result Value Ref Range Status   SARS Coronavirus 2 by RT PCR NEGATIVE NEGATIVE Final    Comment: (NOTE) SARS-CoV-2 target nucleic acids are NOT DETECTED.  The SARS-CoV-2 RNA is generally detectable in upper respiratory specimens during the acute phase of  infection. The lowest concentration of SARS-CoV-2 viral copies this assay can detect is 138 copies/mL. A negative result does not preclude SARS-Cov-2 infection and should not be used as the sole basis for treatment or other patient management decisions. A negative result may occur with  improper specimen collection/handling, submission of specimen other than nasopharyngeal swab, presence of viral mutation(s) within the areas targeted by this assay, and inadequate number of viral copies(<138 copies/mL). A negative result must be combined with clinical observations, patient history, and epidemiological information. The expected result is Negative.  Fact Sheet for Patients:  EntrepreneurPulse.com.au  Fact Sheet for Healthcare Providers:  IncredibleEmployment.be  This test is no t yet approved or cleared by the [place] FDA and  has been authorized for detection and/or diagnosis of SARS-CoV-2 by FDA under an Emergency Use Authorization (EUA). This EUA will remain  in effect (meaning this test can be used) for the duration of the COVID-19 declaration under Section 564(b)(1) of the Act, 21 U.S.C.section 360bbb-3(b)(1), unless the authorization is terminated  or revoked sooner.       Influenza A by PCR NEGATIVE NEGATIVE Final   Influenza B by PCR NEGATIVE NEGATIVE Final    Comment: (NOTE) The Xpert Xpress SARS-CoV-2/FLU/RSV plus assay is intended as an aid in the diagnosis of influenza from Nasopharyngeal swab specimens and should not be used as a sole basis for treatment. Nasal washings and aspirates are unacceptable for Xpert Xpress SARS-CoV-2/FLU/RSV testing.  Fact Sheet for Patients: EntrepreneurPulse.com.au  Fact Sheet for Healthcare Providers: IncredibleEmployment.be  This test is not yet approved or cleared by the [place] FDA and has been authorized for detection and/or diagnosis of SARS-CoV-2  by FDA under an Emergency Use Authorization (EUA). This EUA will remain in effect (meaning this test can be used) for the duration of the COVID-19 declaration under Section 564(b)(1) of the Act, 21 U.S.C. section 360bbb-3(b)(1), unless the authorization is terminated or revoked.  Performed at [facility], [place] [address] [place], [place] [address]      Time coordinating discharge:33 minutes.  SIGNED:   Hosie Poisson, MD  [facility] 08/13/2020, 12:49 PM

## 2020-08-13 NOTE — Progress Notes (Signed)
Discharge instructions provided and explained to patient. Patient verbalized understanding.  Amber Barnett

## 2020-08-13 NOTE — Progress Notes (Signed)
Pt IV removed, discharge instructions reviewed with patient and patients family. All questions answered. Pt discharged with all patients belongings.

## 2020-08-13 NOTE — Progress Notes (Signed)
[facility] Gastroenterology Progress Note  Amber Barnett 86 y.o. [birth year]  CC: Rectal bleeding   Subjective: Patient seen and examined at bedside. No bleeding episodes since last night. Denies any abdominal pain, nausea or vomiting. Wants to go home.  ROS : Afebrile, negative for chest pain   Objective: Vital signs in last 24 hours: Vitals:   08/13/20 0829 08/13/20 0954  BP:  131/64  Pulse:  89  Resp:  16  Temp:  (!) 97.5 F (36.4 C)  SpO2: 99% 98%    Physical Exam:  General:  Alert, cooperative, no distress, appears stated age  Head:  Normocephalic, without obvious abnormality, atraumatic  Eyes:  , EOM's intact,   Lungs:    Anterior exam only.  No respiratory distress noted  Heart:  Regular rate and rhythm, S1, S2 normal  Abdomen:   Soft, non-tender, nondistended, bowel sounds present  Psych  mood and affect normal  Neuro  alert and oriented x3    Lab Results: Recent Labs    08/11/20 0905 08/13/20 0440  NA 143 138  K 4.0 4.0  CL 110 106  CO2 24 23  GLUCOSE 90 95  BUN 22 22  CREATININE 1.19* 1.09*  CALCIUM 8.6* 8.7*   Recent Labs    08/11/20 0905  AST 21  ALT 15  ALKPHOS 59  BILITOT 0.6  PROT 7.0  ALBUMIN 3.9   Recent Labs    08/11/20 0905 08/11/20 1921 08/12/20 1014 08/13/20 0440  WBC 6.0  --   --  6.7  NEUTROABS 3.6  --   --   --   HGB 9.6*   < > 9.4* 9.4*  HCT 30.7*   < > 30.7* 29.5*  MCV 81.4  --   --  80.2  PLT 171  --   --  171   < > = values in this interval not displayed.   Recent Labs    08/11/20 0905  LABPROT 23.1*  INR 2.1*       Assessment  --------------------  -Painless hematochezia.  Most likely diverticular bleed versus hemorrhoidal bleed. Colonoscopy in May 2021 for evaluation of rectal bleeding showed internal hemorrhoids and diverticulosis.  Bleeding scan negative.  -Atrial fibrillation and DVT.  Xarelto on hold.    Recommendations  -------------------------  -No further bleeding episodes.  Hemoglobin stable. Bleeding scan negative. -No further inpatient GI work-up planned. -Resume Xarelto from tomorrow if no further bleeding episodes -Okay to discharge from GI standpoint. GI will sign off. Call us back if needed. Discussed with hospitalist.   Otis Brace MD, [place] 08/13/2020, 10:30 AM  Contact #  [phone]

## 2020-08-14 ENCOUNTER — Inpatient Hospital Stay (HOSPITAL_COMMUNITY): Payer: Medicare HMO

## 2020-08-14 ENCOUNTER — Inpatient Hospital Stay (HOSPITAL_COMMUNITY)
Admission: EM | Admit: 2020-08-14 | Discharge: 2020-08-23 | DRG: 377 | Disposition: A | Discharge status: Nursing Home (Includes ICF) | Payer: Medicare HMO | Source: Skilled Nursing Facility | Attending: Internal Medicine | Admitting: Internal Medicine

## 2020-08-14 ENCOUNTER — Other Ambulatory Visit: Payer: Self-pay

## 2020-08-14 ENCOUNTER — Emergency Department (HOSPITAL_COMMUNITY): Payer: Medicare HMO

## 2020-08-14 DIAGNOSIS — I4821 Permanent atrial fibrillation: Secondary | ICD-10-CM | POA: Diagnosis not present

## 2020-08-14 DIAGNOSIS — I129 Hypertensive chronic kidney disease with stage 1 through stage 4 chronic kidney disease, or unspecified chronic kidney disease: Secondary | ICD-10-CM | POA: Diagnosis present

## 2020-08-14 DIAGNOSIS — Z8673 Personal history of transient ischemic attack (TIA), and cerebral infarction without residual deficits: Secondary | ICD-10-CM

## 2020-08-14 DIAGNOSIS — K5731 Diverticulosis of large intestine without perforation or abscess with bleeding: Principal | ICD-10-CM | POA: Diagnosis present

## 2020-08-14 DIAGNOSIS — D509 Iron deficiency anemia, unspecified: Secondary | ICD-10-CM | POA: Diagnosis present

## 2020-08-14 DIAGNOSIS — I63311 Cerebral infarction due to thrombosis of right middle cerebral artery: Secondary | ICD-10-CM

## 2020-08-14 DIAGNOSIS — Z87891 Personal history of nicotine dependence: Secondary | ICD-10-CM

## 2020-08-14 DIAGNOSIS — D6832 Hemorrhagic disorder due to extrinsic circulating anticoagulants: Secondary | ICD-10-CM | POA: Diagnosis present

## 2020-08-14 DIAGNOSIS — K648 Other hemorrhoids: Secondary | ICD-10-CM | POA: Diagnosis present

## 2020-08-14 DIAGNOSIS — R29704 NIHSS score 4: Secondary | ICD-10-CM | POA: Diagnosis not present

## 2020-08-14 DIAGNOSIS — E785 Hyperlipidemia, unspecified: Secondary | ICD-10-CM | POA: Diagnosis present

## 2020-08-14 DIAGNOSIS — I361 Nonrheumatic tricuspid (valve) insufficiency: Secondary | ICD-10-CM | POA: Diagnosis not present

## 2020-08-14 DIAGNOSIS — R001 Bradycardia, unspecified: Secondary | ICD-10-CM | POA: Diagnosis present

## 2020-08-14 DIAGNOSIS — Z880 Allergy status to penicillin: Secondary | ICD-10-CM

## 2020-08-14 DIAGNOSIS — I340 Nonrheumatic mitral (valve) insufficiency: Secondary | ICD-10-CM | POA: Diagnosis not present

## 2020-08-14 DIAGNOSIS — I959 Hypotension, unspecified: Secondary | ICD-10-CM | POA: Diagnosis present

## 2020-08-14 DIAGNOSIS — Z881 Allergy status to other antibiotic agents status: Secondary | ICD-10-CM

## 2020-08-14 DIAGNOSIS — R4701 Aphasia: Secondary | ICD-10-CM | POA: Diagnosis not present

## 2020-08-14 DIAGNOSIS — Z7951 Long term (current) use of inhaled steroids: Secondary | ICD-10-CM

## 2020-08-14 DIAGNOSIS — D62 Acute posthemorrhagic anemia: Secondary | ICD-10-CM | POA: Diagnosis present

## 2020-08-14 DIAGNOSIS — Z7901 Long term (current) use of anticoagulants: Secondary | ICD-10-CM | POA: Diagnosis not present

## 2020-08-14 DIAGNOSIS — R471 Dysarthria and anarthria: Secondary | ICD-10-CM | POA: Diagnosis not present

## 2020-08-14 DIAGNOSIS — T45515A Adverse effect of anticoagulants, initial encounter: Secondary | ICD-10-CM | POA: Diagnosis present

## 2020-08-14 DIAGNOSIS — I351 Nonrheumatic aortic (valve) insufficiency: Secondary | ICD-10-CM | POA: Diagnosis not present

## 2020-08-14 DIAGNOSIS — I63411 Cerebral infarction due to embolism of right middle cerebral artery: Secondary | ICD-10-CM | POA: Diagnosis not present

## 2020-08-14 DIAGNOSIS — R4781 Slurred speech: Secondary | ICD-10-CM | POA: Diagnosis not present

## 2020-08-14 DIAGNOSIS — K922 Gastrointestinal hemorrhage, unspecified: Secondary | ICD-10-CM | POA: Diagnosis present

## 2020-08-14 DIAGNOSIS — Z823 Family history of stroke: Secondary | ICD-10-CM

## 2020-08-14 DIAGNOSIS — I482 Chronic atrial fibrillation, unspecified: Secondary | ICD-10-CM | POA: Diagnosis present

## 2020-08-14 DIAGNOSIS — I6300 Cerebral infarction due to thrombosis of unspecified precerebral artery: Secondary | ICD-10-CM | POA: Diagnosis not present

## 2020-08-14 DIAGNOSIS — Z79899 Other long term (current) drug therapy: Secondary | ICD-10-CM

## 2020-08-14 DIAGNOSIS — Z9114 Patient's other noncompliance with medication regimen: Secondary | ICD-10-CM

## 2020-08-14 DIAGNOSIS — Z86718 Personal history of other venous thrombosis and embolism: Secondary | ICD-10-CM | POA: Diagnosis not present

## 2020-08-14 DIAGNOSIS — J449 Chronic obstructive pulmonary disease, unspecified: Secondary | ICD-10-CM | POA: Diagnosis present

## 2020-08-14 DIAGNOSIS — R29810 Facial weakness: Secondary | ICD-10-CM | POA: Diagnosis not present

## 2020-08-14 DIAGNOSIS — I639 Cerebral infarction, unspecified: Secondary | ICD-10-CM | POA: Diagnosis present

## 2020-08-14 DIAGNOSIS — I10 Essential (primary) hypertension: Secondary | ICD-10-CM | POA: Diagnosis not present

## 2020-08-14 DIAGNOSIS — Z853 Personal history of malignant neoplasm of breast: Secondary | ICD-10-CM | POA: Diagnosis not present

## 2020-08-14 DIAGNOSIS — N1831 Chronic kidney disease, stage 3a: Secondary | ICD-10-CM | POA: Diagnosis present

## 2020-08-14 DIAGNOSIS — Z91013 Allergy to seafood: Secondary | ICD-10-CM

## 2020-08-14 DIAGNOSIS — Z20822 Contact with and (suspected) exposure to covid-19: Secondary | ICD-10-CM | POA: Diagnosis present

## 2020-08-14 DIAGNOSIS — Z91011 Allergy to milk products: Secondary | ICD-10-CM

## 2020-08-14 DIAGNOSIS — Z9109 Other allergy status, other than to drugs and biological substances: Secondary | ICD-10-CM

## 2020-08-14 DIAGNOSIS — I6389 Other cerebral infarction: Secondary | ICD-10-CM | POA: Diagnosis not present

## 2020-08-14 LAB — I-STAT CHEM 8, ED
BUN: 17 mg/dL (ref 8–23)
Calcium, Ion: 1.1 mmol/L — ABNORMAL LOW (ref 1.15–1.40)
Chloride: 109 mmol/L (ref 98–111)
Creatinine, Ser: 1.2 mg/dL — ABNORMAL HIGH (ref 0.44–1.00)
Glucose, Bld: 90 mg/dL (ref 70–99)
HCT: 34 % — ABNORMAL LOW (ref 36.0–46.0)
Hemoglobin: 11.6 g/dL — ABNORMAL LOW (ref 12.0–15.0)
Potassium: 4.1 mmol/L (ref 3.5–5.1)
Sodium: 142 mmol/L (ref 135–145)
TCO2: 24 mmol/L (ref 22–32)

## 2020-08-14 LAB — COMPREHENSIVE METABOLIC PANEL
ALT: 16 U/L (ref 0–44)
AST: 18 U/L (ref 15–41)
Albumin: 3.8 g/dL (ref 3.5–5.0)
Alkaline Phosphatase: 57 U/L (ref 38–126)
Anion gap: 9 (ref 5–15)
BUN: 14 mg/dL (ref 8–23)
CO2: 22 mmol/L (ref 22–32)
Calcium: 9 mg/dL (ref 8.9–10.3)
Chloride: 107 mmol/L (ref 98–111)
Creatinine, Ser: 1.25 mg/dL — ABNORMAL HIGH (ref 0.44–1.00)
GFR, Estimated: 43 mL/min — ABNORMAL LOW (ref >60)
Glucose, Bld: 93 mg/dL (ref 70–99)
Potassium: 3.7 mmol/L (ref 3.5–5.1)
Sodium: 138 mmol/L (ref 135–145)
Total Bilirubin: 0.8 mg/dL (ref 0.3–1.2)
Total Protein: 7.3 g/dL (ref 6.5–8.1)

## 2020-08-14 LAB — DIFFERENTIAL
Abs Immature Granulocytes: 0.02 10*3/uL (ref 0.00–0.07)
Basophils Absolute: 0 10*3/uL (ref 0.0–0.1)
Basophils Relative: 1 %
Eosinophils Absolute: 0.1 10*3/uL (ref 0.0–0.5)
Eosinophils Relative: 1 %
Immature Granulocytes: 0 %
Lymphocytes Relative: 24 %
Lymphs Abs: 1.7 10*3/uL (ref 0.7–4.0)
Monocytes Absolute: 0.6 10*3/uL (ref 0.1–1.0)
Monocytes Relative: 8 %
Neutro Abs: 4.5 10*3/uL (ref 1.7–7.7)
Neutrophils Relative %: 66 %

## 2020-08-14 LAB — LIPID PANEL
Cholesterol: 132 mg/dL (ref 0–200)
HDL: 54 mg/dL (ref >40)
LDL Cholesterol: 64 mg/dL (ref 0–99)
Total CHOL/HDL Ratio: 2.4 RATIO
Triglycerides: 71 mg/dL (ref <150)
VLDL: 14 mg/dL (ref 0–40)

## 2020-08-14 LAB — RAPID URINE DRUG SCREEN, HOSP PERFORMED
Amphetamines: NOT DETECTED
Barbiturates: NOT DETECTED
Benzodiazepines: POSITIVE — AB
Cocaine: NOT DETECTED
Opiates: NOT DETECTED
Tetrahydrocannabinol: NOT DETECTED

## 2020-08-14 LAB — RESP PANEL BY RT-PCR (FLU A&B, COVID) ARPGX2
Influenza A by PCR: NEGATIVE
Influenza B by PCR: NEGATIVE
SARS Coronavirus 2 by RT PCR: NEGATIVE

## 2020-08-14 LAB — CBC
HCT: 34.1 % — ABNORMAL LOW (ref 36.0–46.0)
Hemoglobin: 10.4 g/dL — ABNORMAL LOW (ref 12.0–15.0)
MCH: 25.1 pg — ABNORMAL LOW (ref 26.0–34.0)
MCHC: 30.5 g/dL (ref 30.0–36.0)
MCV: 82.4 fL (ref 80.0–100.0)
Platelets: 195 10*3/uL (ref 150–400)
RBC: 4.14 MIL/uL (ref 3.87–5.11)
RDW: 18.2 % — ABNORMAL HIGH (ref 11.5–15.5)
WBC: 6.8 10*3/uL (ref 4.0–10.5)
nRBC: 0 % (ref 0.0–0.2)

## 2020-08-14 LAB — ETHANOL: Alcohol, Ethyl (B): <10 mg/dL (ref <10)

## 2020-08-14 LAB — URINALYSIS, ROUTINE W REFLEX MICROSCOPIC
Bacteria, UA: NONE SEEN
Bilirubin Urine: NEGATIVE
Glucose, UA: NEGATIVE mg/dL
Hgb urine dipstick: NEGATIVE
Ketones, ur: NEGATIVE mg/dL
Nitrite: NEGATIVE
Protein, ur: 30 mg/dL — AB
Specific Gravity, Urine: 1.035 — ABNORMAL HIGH (ref 1.005–1.030)
pH: 7 (ref 5.0–8.0)

## 2020-08-14 LAB — HEMOGLOBIN A1C
Hgb A1c MFr Bld: 5.3 % (ref 4.8–5.6)
Mean Plasma Glucose: 105.41 mg/dL

## 2020-08-14 LAB — APTT: aPTT: 26 seconds (ref 24–36)

## 2020-08-14 LAB — HEPARIN LEVEL (UNFRACTIONATED): Heparin Unfractionated: 0.26 IU/mL — ABNORMAL LOW (ref 0.30–0.70)

## 2020-08-14 LAB — CBG MONITORING, ED: Glucose-Capillary: 89 mg/dL (ref 70–99)

## 2020-08-14 LAB — PROTIME-INR
INR: 1.2 (ref 0.8–1.2)
Prothrombin Time: 14.8 seconds (ref 11.4–15.2)

## 2020-08-14 MED ORDER — PANTOPRAZOLE SODIUM 40 MG PO TBEC
40.0000 mg | DELAYED_RELEASE_TABLET | Freq: Every day | ORAL | Status: DC
  Administered 2020-08-15 – 2020-08-23 (×9): 40 mg via ORAL
  Filled 2020-08-14 (×9): qty 1

## 2020-08-14 MED ORDER — ATORVASTATIN CALCIUM 10 MG PO TABS
20.0000 mg | ORAL_TABLET | Freq: Every day | ORAL | Status: DC
  Administered 2020-08-15 – 2020-08-23 (×9): 20 mg via ORAL
  Filled 2020-08-14 (×9): qty 2

## 2020-08-14 MED ORDER — ALBUTEROL SULFATE HFA 108 (90 BASE) MCG/ACT IN AERS
2.0000 | INHALATION_SPRAY | Freq: Four times a day (QID) | RESPIRATORY_TRACT | Status: DC | PRN
  Filled 2020-08-14: qty 6.7

## 2020-08-14 MED ORDER — SODIUM CHLORIDE 0.9 % IV SOLN: INTRAVENOUS | Status: DC

## 2020-08-14 MED ORDER — LORAZEPAM 1 MG PO TABS: 1.0000 mg | ORAL_TABLET | Freq: Three times a day (TID) | ORAL | Status: DC | PRN

## 2020-08-14 MED ORDER — DILTIAZEM HCL ER COATED BEADS 120 MG PO CP24
120.0000 mg | ORAL_CAPSULE | Freq: Every day | ORAL | Status: DC
  Filled 2020-08-14 (×2): qty 1

## 2020-08-14 MED ORDER — ACETAMINOPHEN 650 MG RE SUPP: 650.0000 mg | RECTAL | Status: DC | PRN

## 2020-08-14 MED ORDER — ACETAMINOPHEN 325 MG PO TABS: 650.0000 mg | ORAL_TABLET | ORAL | Status: DC | PRN

## 2020-08-14 MED ORDER — MEMANTINE HCL 10 MG PO TABS
5.0000 mg | ORAL_TABLET | Freq: Two times a day (BID) | ORAL | Status: DC
  Administered 2020-08-14 – 2020-08-23 (×18): 5 mg via ORAL
  Filled 2020-08-14 (×19): qty 1

## 2020-08-14 MED ORDER — HYDROCORTISONE ACETATE 25 MG RE SUPP
25.0000 mg | Freq: Two times a day (BID) | RECTAL | Status: DC
  Administered 2020-08-14 – 2020-08-23 (×17): 25 mg via RECTAL
  Filled 2020-08-14 (×23): qty 1

## 2020-08-14 MED ORDER — MAGNESIUM OXIDE 400 (241.3 MG) MG PO TABS
800.0000 mg | ORAL_TABLET | Freq: Every day | ORAL | Status: DC
  Administered 2020-08-15 – 2020-08-18 (×4): 800 mg via ORAL
  Filled 2020-08-14 (×4): qty 2

## 2020-08-14 MED ORDER — DIPHENHYDRAMINE HCL 25 MG PO CAPS: 25.0000 mg | ORAL_CAPSULE | ORAL | Status: DC | PRN

## 2020-08-14 MED ORDER — LATANOPROST 0.005 % OP SOLN
1.0000 [drp] | Freq: Every day | OPHTHALMIC | Status: DC
  Administered 2020-08-14 – 2020-08-22 (×8): 1 [drp] via OPHTHALMIC
  Filled 2020-08-14 (×2): qty 2.5

## 2020-08-14 MED ORDER — METOPROLOL TARTRATE 5 MG/5ML IV SOLN: 2.5000 mg | Freq: Four times a day (QID) | INTRAVENOUS | Status: DC | PRN

## 2020-08-14 MED ORDER — STROKE: EARLY STAGES OF RECOVERY BOOK
Freq: Once | Status: DC
  Filled 2020-08-14: qty 1

## 2020-08-14 MED ORDER — FAMOTIDINE 20 MG PO TABS
20.0000 mg | ORAL_TABLET | Freq: Every day | ORAL | Status: DC
  Administered 2020-08-15 – 2020-08-23 (×9): 20 mg via ORAL
  Filled 2020-08-14 (×9): qty 1

## 2020-08-14 MED ORDER — SODIUM CHLORIDE 0.9 % IV SOLN
500.0000 mL | INTRAVENOUS | Status: DC
  Administered 2020-08-14: 14:00:00 500 mL via INTRAVENOUS

## 2020-08-14 MED ORDER — FLUTICASONE FUROATE-VILANTEROL 100-25 MCG/INH IN AEPB
1.0000 | INHALATION_SPRAY | Freq: Every day | RESPIRATORY_TRACT | Status: DC
  Administered 2020-08-17 – 2020-08-23 (×7): 1 via RESPIRATORY_TRACT
  Filled 2020-08-14: qty 28

## 2020-08-14 MED ORDER — VITAMIN D 25 MCG (1000 UNIT) PO TABS
2000.0000 [IU] | ORAL_TABLET | Freq: Every day | ORAL | Status: DC
  Administered 2020-08-15 – 2020-08-23 (×9): 2000 [IU] via ORAL
  Filled 2020-08-14 (×9): qty 2

## 2020-08-14 MED ORDER — HEPARIN (PORCINE) 25000 UT/250ML-% IV SOLN
850.0000 [IU]/h | INTRAVENOUS | Status: DC
  Administered 2020-08-14: 14:00:00 750 [IU]/h via INTRAVENOUS
  Filled 2020-08-14: qty 250

## 2020-08-14 MED ORDER — IOHEXOL 350 MG/ML SOLN
75.0000 mL | Freq: Once | INTRAVENOUS | Status: AC | PRN
  Administered 2020-08-14: 75 mL via INTRAVENOUS

## 2020-08-14 MED ORDER — ACETAMINOPHEN 160 MG/5ML PO SOLN: 650.0000 mg | ORAL | Status: DC | PRN

## 2020-08-14 MED ORDER — HYDRALAZINE HCL 20 MG/ML IJ SOLN: 5.0000 mg | Freq: Four times a day (QID) | INTRAMUSCULAR | Status: DC | PRN

## 2020-08-14 MED ORDER — HEMATINIC PLUS VIT/MINERALS 106-1 MG PO TABS: 1.0000 | ORAL_TABLET | Freq: Two times a day (BID) | ORAL | Status: DC

## 2020-08-14 MED ORDER — SENNOSIDES-DOCUSATE SODIUM 8.6-50 MG PO TABS: 1.0000 | ORAL_TABLET | Freq: Every evening | ORAL | Status: DC | PRN

## 2020-08-14 NOTE — ED Notes (Addendum)
Off floor to MRI. Pulse on last vitals was incorrest. HR 71 on recheck

## 2020-08-14 NOTE — Progress Notes (Signed)
[place] for IV heparin Indication: atrial fibrillation and stroke  Allergies  Allergen Reactions  . Fish-Derived Products Swelling    Facial swelling then tongue swelling  . Lactuca Virosa Diarrhea  . Milk-Related Compounds Diarrhea    WHOLE MILK  . Other Swelling    Allergy to all nuts - facial swelling, then tongue swelling  . Amoxicillin Nausea And Vomiting    Patient Measurements: Height: 5\' 6"  (167.6 cm) Weight: 59 kg (130 lb) IBW/kg (Calculated) : 59.3 Heparin Dosing Weight: 59kg  Vital Signs: Temp: 98.3 F (36.8 C) (12/25 2008) Temp Source: Oral (12/25 2008) BP: 123/69 (12/25 2008) Pulse Rate: 76 (12/25 2008)  Labs: Recent Labs    08/13/20 0440 08/14/20 1100 08/14/20 1116 08/14/20 2214  HGB 9.4* 10.4* 11.6*  --   HCT 29.5* 34.1* 34.0*  --   PLT 171 195  --   --   APTT  --  26  --   --   LABPROT  --  14.8  --   --   INR  --  1.2  --   --   HEPARINUNFRC  --   --   --  0.26*  CREATININE 1.09* 1.25* 1.20*  --     Estimated Creatinine Clearance: 32.5 mL/min (A) (by C-G formula based on SCr of 1.2 mg/dL (H)).   Assessment: 86yoF admitted with stroke-like symptoms after stopping Xarelto due to GI bleed. Pt was admitted on 12/22 for dark-colored stools felt to be diverticular bleed. Last dose of Xarelto was taken 12/21 with plans to resume 12/25. Of note, patient had a GI bleed in May 2021 where Xarelto was also held and pt developed bilateral popliteal artery embolisms requiring thrombectomies. Pharmacy has been consulted to dose IV heparin.  Heparin level slightly subtherapeutic (0.26) on gtt at 750 units/hr. No issues with line or bleeding reported per RN.  Goal of Therapy:  Heparin level 0.3-0.5 units/ml Monitor platelets by anticoagulation protocol: Yes   Plan:  Increase heparin infusion to 850 units/hr Will f/u 8 hr heparin level  Sherlon Handing, PharmD, BCPS Please see amion for complete clinical pharmacist  phone list 08/14/2020 10:58 PM

## 2020-08-14 NOTE — Progress Notes (Signed)
ANTICOAGULATION CONSULT NOTE - Initial Consult  Pharmacy Consult for IV heparin Indication: atrial fibrillation and stroke  Allergies  Allergen Reactions  . Fish-Derived Products Swelling    Facial swelling then tongue swelling  . Lactuca Virosa Diarrhea  . Milk-Related Compounds Diarrhea    WHOLE MILK  . Other Swelling    Allergy to all nuts - facial swelling, then tongue swelling  . Amoxicillin Nausea And Vomiting    Patient Measurements: Height: 5\' 6"  (167.6 cm) Weight: 59 kg (130 lb) IBW/kg (Calculated) : 59.3 Heparin Dosing Weight: 59kg  Vital Signs: Temp: 99.2 F (37.3 C) (12/25 1044) Temp Source: Oral (12/25 1044) BP: 130/78 (12/25 1145) Pulse Rate: 85 (12/25 1145)  Labs: Recent Labs    08/13/20 0440 08/14/20 1100 08/14/20 1116  HGB 9.4* 10.4* 11.6*  HCT 29.5* 34.1* 34.0*  PLT 171 195  --   APTT  --  26  --   LABPROT  --  14.8  --   INR  --  1.2  --   CREATININE 1.09* 1.25* 1.20*    Estimated Creatinine Clearance: 32.5 mL/min (A) (by C-G formula based on SCr of 1.2 mg/dL (H)).   Medical History: Past Medical History:  Diagnosis Date  . Atrial fibrillation ([employer])   . DVT (deep venous thrombosis) ([place])   . Dysrhythmia   . History of left breast cancer   . Hypertension   . Stroke [facility])     Medications:  Scheduled:  .  stroke: mapping our early stages of recovery book   Does not apply Once    Assessment: 86yoF admitted with stroke-like symptoms after stopping Xarelto due to GI bleed. Pt was admitted on 12/22 for dark-colored stools felt to be diverticular bleed. Last dose of Xarelto was taken 12/21 with plans to resume 12/25. Of note, patient had a GI bleed in May 2021 where Xarelto was also held and pt developed bilateral popliteal artery embolisms requiring thrombectomies. Pharmacy has been consulted to dose IV heparin.  CBC on admit WNL, Hgb 11.6 and PLT 195. Will not bolus heparin due to GI bleed.   Goal of Therapy:  Heparin level 0.3-0.5  units/ml Monitor platelets by anticoagulation protocol: Yes   Plan:  Start heparin infusion at 750 units/hr Check anti-Xa level in 8 hours and daily while on heparin  Monitor s/sx bleeding closely, follow daily CBC Follow up with plans for long-term anticoagulation   Mercy Riding, PharmD PGY1 Acute Care Pharmacy Resident Please refer to [facility] for unit-specific pharmacist

## 2020-08-14 NOTE — ED Provider Notes (Signed)
[facility] EMERGENCY DEPARTMENT Provider Note   CSN: [id number] Arrival date & time: 08/14/20  1031     History Chief Complaint  Patient presents with  . Stroke Symptoms    Amber Barnett is a 86 y.o. female with a past medical history of prior stroke, hypertension, A. fib on Xarelto presenting to the ED with strokelike symptoms.  History provided by patient and niece over the phone.  At approximately 6 or 7 PM yesterday patient's grandson was with her in stated that she was in her usual state of health.  Patient talked to a classmate on the phone around 8 or 9 PM and they told her that she had slurred speech.  Patient also noticed that she had slurred speech around this time.  When she woke up this morning and spoke to a family member around 9:30 AM they noticed the slurred speech persisted.  They called EMS shortly thereafter.  Patient states that she feels that she has a facial droop on the left side.  She denies any vision changes, numbness in arms or legs or face, injuries or falls, chest pain, shortness of breath.  She states that she did not have any residual effects of her prior stroke. Of note, patient discharged from the hospital yesterday after 2-day stay for anemia of blood loss secondary to diverticular bleed.  HPI     Past Medical History:  Diagnosis Date  . Atrial fibrillation ([place])   . DVT (deep venous thrombosis) ([place])   . Dysrhythmia   . History of left breast cancer   . Hypertension   . Stroke [facility])     Patient Active Problem List   Diagnosis Date Noted  . CVA (cerebral vascular accident) ([place]) 08/14/2020  . GIB (gastrointestinal bleeding) 08/11/2020  . Rectal bleeding 12/25/2019  . Current use of long term anticoagulation 05/21/2017  . Cerebral embolism with cerebral infarction ([place]) 05/18/2017  . Essential hypertension 05/08/2017  . Permanent atrial fibrillation ([place]) 05/08/2017    Past Surgical History:  Procedure  Laterality Date  . COLONOSCOPY WITH PROPOFOL N/A 12/29/2019   Procedure: COLONOSCOPY WITH PROPOFOL;  Surgeon: Arta Silence, MD;  Location: [facility] ENDOSCOPY;  Service: Endoscopy;  Laterality: N/A;  . IR ANGIO INTRA EXTRACRAN SEL COM CAROTID INNOMINATE UNI R MOD SED  05/18/2017  . IR ANGIO VERTEBRAL SEL SUBCLAVIAN INNOMINATE UNI R MOD SED  05/18/2017  . IR PERCUTANEOUS ART THROMBECTOMY/INFUSION INTRACRANIAL INC DIAG ANGIO  05/18/2017  . IR RADIOLOGIST EVAL & MGMT  07/19/2017  . RADIOLOGY WITH ANESTHESIA N/A 05/18/2017   Procedure: RADIOLOGY WITH ANESTHESIA;  Surgeon: Luanne Bras, MD;  Location: [facility];  Service: Radiology;  Laterality: N/A;     OB History   No obstetric history on file.     Family History  Problem Relation Age of Onset  . Stroke Son     Social History   Tobacco Use  . Smoking status: Former Smoker    Types: Cigarettes  . Smokeless tobacco: Never Used  Vaping Use  . Vaping Use: Never used  Substance Use Topics  . Alcohol use: No  . Drug use: Never    Home Medications Prior to Admission medications   Medication Sig Start Date End Date Taking? Authorizing Provider  albuterol (VENTOLIN HFA) 108 (90 Base) MCG/ACT inhaler Inhale 2 puffs into the lungs 4 (four) times daily as needed for wheezing or shortness of breath. 03/11/20  Yes [provider]  amLODipine-valsartan (EXFORGE) 5-320 MG tablet Take 1  tablet by mouth daily. 08/17/16  Yes [provider]  atorvastatin (LIPITOR) 20 MG tablet Take 1 tablet (20 mg total) by mouth daily. 07/02/20 07/02/21 Yes Garvin Fila, MD  Cholecalciferol (VITAMIN D3) 2000 units capsule Take 2,000 Units by mouth daily.   Yes [provider]  diltiazem (TIAZAC) 120 MG 24 hr capsule Take 120 mg by mouth daily. 04/26/17  Yes [provider]  diphenhydrAMINE (BENADRYL) 25 MG tablet Take 25 mg by mouth as needed for itching or allergies.   Yes [provider]  famotidine (PEPCID) 20 MG tablet  Take 20 mg by mouth daily. 11/27/19  Yes [provider]  Fe Fum-FA-B Cmp-C-Zn-Mg-Mn-Cu (HEMATINIC PLUS VIT/MINERALS) 106-1 MG TABS Take 1 tablet by mouth 2 (two) times daily. 07/26/20  Yes [provider]  furosemide (LASIX) 20 MG tablet Take 20 mg by mouth daily. MAY TAKE 2 TABLETS IF GAINS OVER 1.5 LB IN A DAY 07/01/20  Yes [provider]  latanoprost (XALATAN) 0.005 % ophthalmic solution Place 1 drop into both eyes at bedtime. 10/16/16  Yes [provider]  LORazepam (ATIVAN) 1 MG tablet Take 1 mg by mouth every 8 (eight) hours as needed for anxiety.    Yes [provider]  MAGNESIUM-OXIDE 400 (241.3 Mg) MG tablet Take 800 mg by mouth daily.  12/20/19  Yes [provider]  memantine (NAMENDA) 5 MG tablet Take 5 mg by mouth 2 (two) times daily. 07/07/20  Yes [provider]  pantoprazole (PROTONIX) 40 MG tablet Take 40 mg by mouth daily. 04/26/17  Yes [provider]  potassium chloride (MICRO-K) 10 MEQ CR capsule Take 10 mEq by mouth daily. 07/21/20  Yes [provider]  SYMBICORT 80-4.5 MCG/ACT inhaler Inhale 2 puffs into the lungs 2 (two) times daily. 04/16/20  Yes [provider]  hydrocortisone (ANUSOL-HC) 25 MG suppository Place 1 suppository (25 mg total) rectally 2 (two) times daily. 08/13/20   Hosie Poisson, MD  Rivaroxaban (XARELTO) 15 MG TABS tablet Take 1 tablet (15 mg total) by mouth daily with supper. 08/14/20   Hosie Poisson, MD    Allergies    Fish-derived products, Lactuca virosa, Milk-related compounds, Other, and Amoxicillin  Review of Systems   Review of Systems  Constitutional: Negative for appetite change, chills and fever.  HENT: Negative for ear pain, rhinorrhea, sneezing and sore throat.   Eyes: Negative for photophobia and visual disturbance.  Respiratory: Negative for cough, chest tightness, shortness of breath and wheezing.   Cardiovascular: Negative for chest pain and palpitations.   Gastrointestinal: Negative for abdominal pain, blood in stool, constipation, diarrhea, nausea and vomiting.  Genitourinary: Negative for dysuria, hematuria and urgency.  Musculoskeletal: Negative for myalgias.  Skin: Negative for rash.  Neurological: Positive for facial asymmetry, speech difficulty and weakness. Negative for dizziness and light-headedness.    Physical Exam Updated Vital Signs BP 130/78   Pulse 85   Temp 99.2 F (37.3 C) (Oral)   Resp 19   Ht 5\' 6"  (1.676 m)   Wt 59 kg   SpO2 98%   BMI 20.98 kg/m   Physical Exam Vitals and nursing note reviewed.  Constitutional:      General: She is not in acute distress.    Appearance: She is well-developed and well-nourished.  HENT:     Head: Normocephalic and atraumatic.     Nose: Nose normal.  Eyes:     General: No scleral icterus.       Right eye: No  discharge.        Left eye: No discharge.     Extraocular Movements: EOM normal.     Conjunctiva/sclera: Conjunctivae normal.     Pupils: Pupils are equal, round, and reactive to light.  Cardiovascular:     Rate and Rhythm: Normal rate and regular rhythm.     Pulses: Intact distal pulses.     Heart sounds: Normal heart sounds. No murmur heard. No friction rub. No gallop.   Pulmonary:     Effort: Pulmonary effort is normal. No respiratory distress.     Breath sounds: Normal breath sounds.  Abdominal:     General: Bowel sounds are normal. There is no distension.     Palpations: Abdomen is soft.     Tenderness: There is no abdominal tenderness. There is no guarding.  Musculoskeletal:        General: No edema. Normal range of motion.     Cervical back: Normal range of motion and neck supple.  Skin:    General: Skin is warm and dry.     Findings: No rash.  Neurological:     Mental Status: She is alert. Mental status is at baseline.     Cranial Nerves: Cranial nerve deficit present.     Motor: Weakness present. No abnormal muscle tone.     Coordination:  Coordination normal.     Comments: Left-sided facial droop noted.  Asymmetrical tongue protrusion.  Normal sensation to light touch of face, bilateral upper and lower extremities.  Strength 4/5 in left upper extremity with drift noted in right upper extremity.  Strength 5/5 in bilateral lower extremities.  Aphasia noted.  Alert and oriented x3.  Normal finger-to-nose coordination bilaterally.  Psychiatric:        Mood and Affect: Mood and affect normal.     ED Results / Procedures / Treatments   Labs (all labs ordered are listed, but only abnormal results are displayed) Labs Reviewed  CBC - Abnormal; Notable for the following components:      Result Value   Hemoglobin 10.4 (*)    HCT 34.1 (*)    MCH 25.1 (*)    RDW 18.2 (*)    All other components within normal limits  COMPREHENSIVE METABOLIC PANEL - Abnormal; Notable for the following components:   Creatinine, Ser 1.25 (*)    GFR, Estimated 43 (*)    All other components within normal limits  I-STAT CHEM 8, ED - Abnormal; Notable for the following components:   Creatinine, Ser 1.20 (*)    Calcium, Ion 1.10 (*)    Hemoglobin 11.6 (*)    HCT 34.0 (*)    All other components within normal limits  RESP PANEL BY RT-PCR (FLU A&B, COVID) ARPGX2  ETHANOL  PROTIME-INR  APTT  DIFFERENTIAL  RAPID URINE DRUG SCREEN, HOSP PERFORMED  URINALYSIS, ROUTINE W REFLEX MICROSCOPIC  LIPID PANEL  HEMOGLOBIN A1C  CBG MONITORING, ED    EKG EKG Interpretation  Date/Time:  Saturday August 14 2020 10:44:38 EST Ventricular Rate:  85 PR Interval:    QRS Duration: 83 QT Interval:  379 QTC Calculation: 451 R Axis:   -65 Text Interpretation: Atrial fibrillation Ventricular premature complex Left anterior fascicular block Consider right ventricular hypertrophy Probable LVH with secondary repol abnrm Anterior Q waves, possibly due to LVH Baseline wander in lead(s) V6 Confirmed by Fredia Sorrow [phone]) on 08/14/2020 12:23:00 PM   Radiology CT  Angio Head W or Wo Contrast  Result Date: 08/14/2020 CLINICAL DATA:  Slurred speech. Recent GI bleeding and discharge. Atrial fibrillation EXAM: CT ANGIOGRAPHY HEAD AND NECK TECHNIQUE: Multidetector CT imaging of the head and neck was performed using the standard protocol during bolus administration of intravenous contrast. Multiplanar CT image reconstructions and MIPs were obtained to evaluate the vascular anatomy. Carotid stenosis measurements (when applicable) are obtained utilizing NASCET criteria, using the distal internal carotid diameter as the denominator. CONTRAST:  32mL OMNIPAQUE IOHEXOL 350 MG/ML SOLN COMPARISON:  Brain MRI 05/19/2017 FINDINGS: CT HEAD FINDINGS Brain: Moderate area of cytotoxic edema appearance involving the anterior right insula and frontal operculum. No hemorrhage, hydrocephalus, or masslike finding. Vascular: See below Skull: Unremarkable Sinuses: Mild opacification of the left sphenoid sinus, incidental. Orbits: Negative Review of the MIP images confirms the above findings CTA NECK FINDINGS Aortic arch: Atheromatous calcification.  Two vessel branching. Right carotid system: Mild, especially for age, atheromatous plaque at the bifurcation without stenosis or ulceration. Left carotid system: Mild atheromatous plaque at the bifurcation. There is ICA tortuosity with kinking. No dissection or beading. Vertebral arteries: No proximal subclavian stenosis. The codominant vertebral arteries are widely patent. Small outpouching laterally from the V2 segment on the right. No generalized beading or dissection flap. Skeleton: Lower cervical disc degeneration and generalized degenerative facet spurring. No acute or aggressive finding. Other neck: Negative Upper chest: Mild emphysema. Review of the MIP images confirms the above findings CTA HEAD FINDINGS Anterior circulation: Atheromatous calcification on the carotid siphons. Right M2 branch occlusion/pre occlusion with faint reconstitution and  then subsequent reocclusion at the M3 level, correlating with the level of infarct. Regional M3/4 branch is appear asymmetrically irregular on MIPS. No contralateral embolism is noted. Negative for aneurysm Posterior circulation: The vertebral and basilar arteries are smooth and diffusely patent. Fetal type right PCA. No branch occlusion, beading, or aneurysm. Venous sinuses: Diffusely patent Anatomic variants: None significant Review of the MIP images confirms the above findings IMPRESSION: 1. Right M2/3 branch occlusion with completed insular and opercular infarction. Regional M3/4 branch is appear asymmetrically irregular, likely from showering of smaller emboli in this territory. 2. Mild for age atherosclerosis in the head and neck. Electronically Signed   By: Monte Fantasia M.D.   On: 08/14/2020 12:30   CT Angio Neck W and/or Wo Contrast  Result Date: 08/14/2020 CLINICAL DATA:  Slurred speech. Recent GI bleeding and discharge. Atrial fibrillation EXAM: CT ANGIOGRAPHY HEAD AND NECK TECHNIQUE: Multidetector CT imaging of the head and neck was performed using the standard protocol during bolus administration of intravenous contrast. Multiplanar CT image reconstructions and MIPs were obtained to evaluate the vascular anatomy. Carotid stenosis measurements (when applicable) are obtained utilizing NASCET criteria, using the distal internal carotid diameter as the denominator. CONTRAST:  61mL OMNIPAQUE IOHEXOL 350 MG/ML SOLN COMPARISON:  Brain MRI 05/19/2017 FINDINGS: CT HEAD FINDINGS Brain: Moderate area of cytotoxic edema appearance involving the anterior right insula and frontal operculum. No hemorrhage, hydrocephalus, or masslike finding. Vascular: See below Skull: Unremarkable Sinuses: Mild opacification of the left sphenoid sinus, incidental. Orbits: Negative Review of the MIP images confirms the above findings CTA NECK FINDINGS Aortic arch: Atheromatous calcification.  Two vessel branching. Right carotid  system: Mild, especially for age, atheromatous plaque at the bifurcation without stenosis or ulceration. Left carotid system: Mild atheromatous plaque at the bifurcation. There is ICA tortuosity with kinking. No dissection or beading. Vertebral arteries: No proximal subclavian stenosis. The codominant vertebral arteries are widely patent. Small outpouching laterally from the V2 segment on the right. No generalized beading or  dissection flap. Skeleton: Lower cervical disc degeneration and generalized degenerative facet spurring. No acute or aggressive finding. Other neck: Negative Upper chest: Mild emphysema. Review of the MIP images confirms the above findings CTA HEAD FINDINGS Anterior circulation: Atheromatous calcification on the carotid siphons. Right M2 branch occlusion/pre occlusion with faint reconstitution and then subsequent reocclusion at the M3 level, correlating with the level of infarct. Regional M3/4 branch is appear asymmetrically irregular on MIPS. No contralateral embolism is noted. Negative for aneurysm Posterior circulation: The vertebral and basilar arteries are smooth and diffusely patent. Fetal type right PCA. No branch occlusion, beading, or aneurysm. Venous sinuses: Diffusely patent Anatomic variants: None significant Review of the MIP images confirms the above findings IMPRESSION: 1. Right M2/3 branch occlusion with completed insular and opercular infarction. Regional M3/4 branch is appear asymmetrically irregular, likely from showering of smaller emboli in this territory. 2. Mild for age atherosclerosis in the head and neck. Electronically Signed   By: Monte Fantasia M.D.   On: 08/14/2020 12:30   NM GI Blood Loss  Result Date: 08/12/2020 CLINICAL DATA:  Lower GI bleed EXAM: NUCLEAR MEDICINE GASTROINTESTINAL BLEEDING SCAN TECHNIQUE: Sequential abdominal images were obtained following intravenous administration of Tc-38m labeled red blood cells. RADIOPHARMACEUTICALS:  22.0 mCi Tc-37m  pertechnetate in-vitro labeled red cells. COMPARISON:  None FINDINGS: Following the IV administration of the radiopharmaceutical dynamic imaging was performed for 1 hour and 20 minutes. No signs of active GI bleed identified. IMPRESSION: 1. Negative for active GI bleed. Electronically Signed   By: Kerby Moors M.D.   On: 08/12/2020 14:53    Procedures .Critical Care Performed by: Delia Heady, PA-C Authorized by: Delia Heady, PA-C   Critical care provider statement:    Critical care time (minutes):  45   Critical care was necessary to treat or prevent imminent or life-threatening deterioration of the following conditions:  CNS failure or compromise and circulatory failure   Critical care was time spent personally by me on the following activities:  Development of treatment plan with patient or surrogate, discussions with consultants, obtaining history from patient or surrogate, ordering and performing treatments and interventions, ordering and review of laboratory studies, ordering and review of radiographic studies, re-evaluation of patient's condition, review of old charts, evaluation of patient's response to treatment and examination of patient   I assumed direction of critical care for this patient from another provider in my specialty: no     (including critical care time)  Medications Ordered in ED Medications  0.9 %  sodium chloride infusion (has no administration in time range)   stroke: mapping our early stages of recovery book (has no administration in time range)  0.9 %  sodium chloride infusion (has no administration in time range)  acetaminophen (TYLENOL) tablet 650 mg (has no administration in time range)    Or  acetaminophen (TYLENOL) 160 MG/5ML solution 650 mg (has no administration in time range)    Or  acetaminophen (TYLENOL) suppository 650 mg (has no administration in time range)  senna-docusate (Senokot-S) tablet 1 tablet (has no administration in time range)   heparin ADULT infusion 100 units/mL (25000 units/237mL) (has no administration in time range)  iohexol (OMNIPAQUE) 350 MG/ML injection 75 mL (75 mLs Intravenous Contrast Given 08/14/20 1220)    ED Course  I have reviewed the triage vital signs and the nursing notes.  Pertinent labs & imaging results that were available during my care of the patient were reviewed by me and considered in my medical decision  making (see chart for details).    MDM Rules/Calculators/A&P                          86 year old female with past medical history of prior stroke, hypertension, A. fib on Xarelto presenting to the ED with strokelike symptoms.  History provided by patient and niece over the phone.  Was in her usual state of health until last night at around 8 or 9 PM when she was speaking to a classmate on the phone and they noticed she had slurred speech.  Patient also noticed this around this time.  Family states that this persisted when I spoke to her this morning around 9:30 AM.  She has a left-sided facial droop of unknown chronicity.  She was discharged from the hospital for GI bleed yesterday without the symptoms.  She denies any persistent bleeding.  On exam there is an obvious left-sided facial droop.  She has some decreased strength in her left upper extremity and some pronator drift noted on her right upper extremity.  Normal strength and sensation of all extremities and face.  Good strength of bilateral lower extremities. Normal coordination and no show field deficits.  Work-up here significant for slight elevation in creatinine from her baseline to 1.25.  Hemoglobin of 10.4 which is higher than her value yesterday.  Her CT scans today show a new right M2/3 branch occlusion.  Patient was evaluated by stroke team include neurologist at the bedside.  He recommends IV heparin and further imaging.  We will admit to medicine service for further management and stroke work-up.  Please see hospitalist and  neurology notes for further detail.   Portions of this note were generated with [employer]. Dictation errors may occur despite best attempts at proofreading.  Final Clinical Impression(s) / ED Diagnoses Final diagnoses:  Cerebrovascular accident (CVA), unspecified mechanism [facility])    Rx / [place] Orders ED Discharge Orders    None       Delia Heady, PA-C 08/14/20 1317    Fredia Sorrow, MD 08/28/20 2350

## 2020-08-14 NOTE — ED Notes (Signed)
Back from CT

## 2020-08-14 NOTE — ED Notes (Signed)
Still out at MRI 

## 2020-08-14 NOTE — ED Notes (Signed)
Verified 2nd start of heprin by [facility] RN

## 2020-08-14 NOTE — Progress Notes (Signed)
Pt has been admitted to the unit via hospital bed. All IV and equipment were sent and intact. Pt denies any pain and has daughter at bedside. Telephone and call light are within reach  08/14/20 1819  Vitals  Temp 98.2 F (36.8 C)  Temp Source Oral  BP 137/69  MAP (mmHg) 83  BP Location Left Arm  BP Method Automatic  Patient Position (if appropriate) Lying  Pulse Rate 94  Pulse Rate Source Dinamap  Resp (!) 21  Level of Consciousness  Level of Consciousness Alert  MEWS COLOR  MEWS Score Color Green  Oxygen Therapy  SpO2 100 %  O2 Device Room Air  Patient Activity (if Appropriate) In bed  Pulse Oximetry Type Continuous  Pain Assessment  Pain Scale 0-10  Pain Score 0  Patients Stated Pain Goal 0  Glasgow Coma Scale  Eye Opening 4  Best Verbal Response (NON-intubated) 5  Best Motor Response 6  Glasgow Coma Scale Score 15  MEWS Score  MEWS Temp 0  MEWS Systolic 0  MEWS Pulse 0  MEWS RR 1  MEWS LOC 0  MEWS Score 1

## 2020-08-14 NOTE — ED Triage Notes (Signed)
Pt from [facility] in [place], via EMS. LKW 2030 08/13/20. Family called at approx 0930 12/25 stating pt had slurred speech. EMS stated that Pt had gaze pref. To the right. And facial droop to left, No LVO. EMS vitals 140/90, CBG 122. Pt discharged from hospital last night and taken off blood thinners last night

## 2020-08-14 NOTE — ED Notes (Signed)
Off floor to xray.

## 2020-08-14 NOTE — ED Provider Notes (Addendum)
Medical screening examination/treatment/procedure(s) were conducted as a shared visit with non-physician practitioner(s) and myself.  I personally evaluated the patient during the encounter.      Patient seen by me along with physician assistant.  Patient just discharged from hospital yesterday.  Patient is a resident of [facility] in [place] which is sort of independent living.  Family noted that this morning the patient had slurred speech and her gaze preference seem to be to the right and there was left facial droop.  Patient stated that earlier she could not speak at all.  But now the speech may just be secondary to the facial weakness.  Patient was recently admitted for GI bleed.  So she was removed from her blood thinners.  But she felt fine at the time of discharge yesterday.  Onset of symptoms seem to be around 8 or 9 PM.  Patient out of the window for TPA.  Does not seem to have any band symptoms.  Visual fields seem to be intact.  Patient's facial weakness seems to spare the forehead on the left side.  Symptoms seem to be suggestive of CVA.  Contacted neuro hospitalist who recommended CT CT angio head and neck.  They will see the patient in consultation.  Clinically it appears the patient has had a CVA event.  Do not think this is a Bell's palsy.  Since no evidence of large vessel occlusion patient probably not a candidate for vascular intervention  EKG shows atrial fibrillation.  Fact that she had to stop her blood thinners is possible that this resulted in the stroke.Fredia Sorrow, MD 08/14/20 1221    Fredia Sorrow, MD 08/14/20 [id number]

## 2020-08-14 NOTE — H&P (Signed)
History and Physical    Amber Barnett [id number] DOB: [birth year] DOA: 08/14/2020  PCP: Egbert Garibaldi, PA-C (Confirm with patient/family/NH records and if not entered, this has to be entered at [facility] point of entry) Patient coming from: Home  I have personally briefly reviewed patient's old medical records in [place]  Chief Complaint: Left facial droop, slurred speech  HPI: Amber Barnett is a 86 y.o. female with medical history significant of diverticulosis with recurrent lower GI bleed, chronic A. 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  Was hospitalized 2 days ago for lower GI bleed, H&H stabilized and GI bleeding stopped for more than 24 hours and patient discharged home yesterday and planned to restart Xarelto today.  Last night before sleep, patient reported "some problems of focusing my vision", but this morning patient woke up and family found patient had a new left facial droop and slurred speech.  And sent her to ED.  Patient denies any new rectal bleeding, no chest pain or headache. ED Course: CTA showed right M2/3 infarct and right and 3/4 emboli.  Review of Systems: As per HPI otherwise 14 point review of systems negative.    Past Medical History:  Diagnosis Date   Atrial fibrillation ([place])    DVT (deep venous thrombosis) (HCC)    Dysrhythmia    History of left breast cancer    Hypertension    Stroke [facility])     Past Surgical History:  Procedure Laterality Date   COLONOSCOPY WITH PROPOFOL N/A 12/29/2019   Procedure: COLONOSCOPY WITH PROPOFOL;  Surgeon: Arta Silence, MD;  Location: [facility];  Service: Endoscopy;  Laterality: N/A;   IR ANGIO INTRA EXTRACRAN SEL COM CAROTID INNOMINATE UNI R MOD SED  05/18/2017   IR ANGIO VERTEBRAL SEL SUBCLAVIAN INNOMINATE UNI R MOD SED  05/18/2017   IR PERCUTANEOUS  ART THROMBECTOMY/INFUSION INTRACRANIAL INC DIAG ANGIO  05/18/2017   IR RADIOLOGIST EVAL & MGMT  07/19/2017   RADIOLOGY WITH ANESTHESIA N/A 05/18/2017   Procedure: RADIOLOGY WITH ANESTHESIA;  Surgeon: Luanne Bras, MD;  Location: [place];  Service: Radiology;  Laterality: N/A;     reports that she has quit smoking. Her smoking use included cigarettes. She has never used smokeless tobacco. She reports that she does not drink alcohol and does not use drugs.  Allergies  Allergen Reactions   Fish-Derived Products Swelling    Facial swelling then tongue swelling   Lactuca Virosa Diarrhea   Milk-Related Compounds Diarrhea    WHOLE MILK   Other Swelling    Allergy to all nuts - facial swelling, then tongue swelling   Amoxicillin Nausea And Vomiting    Family History  Problem Relation Age of Onset   Stroke Son      Prior to Admission medications   Medication Sig Start Date End Date Taking? Authorizing Provider  albuterol (VENTOLIN HFA) 108 (90 Base) MCG/ACT inhaler Inhale 2 puffs into the lungs 4 (four) times daily as needed for wheezing or shortness of breath. 03/11/20  Yes [provider]  amLODipine-valsartan (EXFORGE) 5-320 MG tablet Take 1 tablet by mouth daily. 08/17/16  Yes [provider]  atorvastatin (LIPITOR) 20 MG tablet Take 1 tablet (20 mg total) by mouth daily. 07/02/20 07/02/21 Yes Garvin Fila, MD  Cholecalciferol (VITAMIN D3) 2000 units capsule Take 2,000 Units by mouth daily.   Yes [provider]  diltiazem (  TIAZAC) 120 MG 24 hr capsule Take 120 mg by mouth daily. 04/26/17  Yes [provider]  diphenhydrAMINE (BENADRYL) 25 MG tablet Take 25 mg by mouth as needed for itching or allergies.   Yes [provider]  famotidine (PEPCID) 20 MG tablet Take 20 mg by mouth daily. 11/27/19  Yes [provider]  Fe Fum-FA-B Cmp-C-Zn-Mg-Mn-Cu (HEMATINIC PLUS VIT/MINERALS) 106-1 MG TABS Take 1 tablet by mouth 2 (two) times  daily. 07/26/20  Yes [provider]  furosemide (LASIX) 20 MG tablet Take 20 mg by mouth daily. MAY TAKE 2 TABLETS IF GAINS OVER 1.5 LB IN A DAY 07/01/20  Yes [provider]  latanoprost (XALATAN) 0.005 % ophthalmic solution Place 1 drop into both eyes at bedtime. 10/16/16  Yes [provider]  LORazepam (ATIVAN) 1 MG tablet Take 1 mg by mouth every 8 (eight) hours as needed for anxiety.    Yes [provider]  MAGNESIUM-OXIDE 400 (241.3 Mg) MG tablet Take 800 mg by mouth daily.  12/20/19  Yes [provider]  memantine (NAMENDA) 5 MG tablet Take 5 mg by mouth 2 (two) times daily. 07/07/20  Yes [provider]  pantoprazole (PROTONIX) 40 MG tablet Take 40 mg by mouth daily. 04/26/17  Yes [provider]  potassium chloride (MICRO-K) 10 MEQ CR capsule Take 10 mEq by mouth daily. 07/21/20  Yes [provider]  SYMBICORT 80-4.5 MCG/ACT inhaler Inhale 2 puffs into the lungs 2 (two) times daily. 04/16/20  Yes [provider]  hydrocortisone (ANUSOL-HC) 25 MG suppository Place 1 suppository (25 mg total) rectally 2 (two) times daily. 08/13/20   Hosie Poisson, MD  Rivaroxaban (XARELTO) 15 MG TABS tablet Take 1 tablet (15 mg total) by mouth daily with supper. 08/14/20   Hosie Poisson, MD    Physical Exam: Vitals:   08/14/20 1043 08/14/20 1044 08/14/20 1046 08/14/20 1145  BP:  (!) 144/74  130/78  Pulse:  87  85  Resp:  14  19  Temp:  99.2 F (37.3 C)    TempSrc:  Oral    SpO2: 96% 97%  98%  Weight:   59 kg   Height:   5\' 6"  (1.676 m)     Constitutional: NAD, calm, comfortable Vitals:   08/14/20 1043 08/14/20 1044 08/14/20 1046 08/14/20 1145  BP:  (!) 144/74  130/78  Pulse:  87  85  Resp:  14  19  Temp:  99.2 F (37.3 C)    TempSrc:  Oral    SpO2: 96% 97%  98%  Weight:   59 kg   Height:   5\' 6"  (1.676 m)    Eyes: PERRL, lids and conjunctivae normal ENMT: Mucous membranes are moist. Posterior pharynx clear of any  exudate or lesions.Normal dentition.  Neck: normal, supple, no masses, no thyromegaly Respiratory: clear to auscultation bilaterally, no wheezing, no crackles. Normal respiratory effort. No accessory muscle use.  Cardiovascular: Regular rate and rhythm, no murmurs / rubs / gallops. No extremity edema. 2+ pedal pulses. No carotid bruits.  Abdomen: no tenderness, no masses palpated. No hepatosplenomegaly. Bowel sounds positive.  Musculoskeletal: no clubbing / cyanosis. No joint deformity upper and lower extremities. Good ROM, no contractures. Normal muscle tone.  Skin: no rashes, lesions, ulcers. No induration Neurologic: Left facial droop and left-sided tongue deviation. Sensation intact, DTR normal. Strength 5/5 in all 4.  Psychiatric: Normal judgment and insight. Alert and oriented x 3. Normal mood.     Labs on Admission:  I have personally reviewed following labs and imaging studies  CBC: Recent Labs  Lab 08/11/20 0905 08/11/20 1921 08/12/20 0217 08/12/20 1014 08/13/20 0440 08/14/20 1100 08/14/20 1116  WBC 6.0  --   --   --  6.7 6.8  --   NEUTROABS 3.6  --   --   --   --  4.5  --   HGB 9.6*   < > 8.9* 9.4* 9.4* 10.4* 11.6*  HCT 30.7*   < > 28.4* 30.7* 29.5* 34.1* 34.0*  MCV 81.4  --   --   --  80.2 82.4  --   PLT 171  --   --   --  171 195  --    < > = values in this interval not displayed.   Basic Metabolic Panel: Recent Labs  Lab 08/11/20 0905 08/13/20 0440 08/14/20 1100 08/14/20 1116  NA 143 138 138 142  K 4.0 4.0 3.7 4.1  CL 110 106 107 109  CO2 24 23 22   --   GLUCOSE 90 95 93 90  BUN 22 22 14 17   CREATININE 1.19* 1.09* 1.25* 1.20*  CALCIUM 8.6* 8.7* 9.0  --    GFR: Estimated Creatinine Clearance: 32.5 mL/min (A) (by C-G formula based on SCr of 1.2 mg/dL (H)). Liver Function Tests: Recent Labs  Lab 08/11/20 0905 08/14/20 1100  AST 21 18  ALT 15 16  ALKPHOS 59 57  BILITOT 0.6 0.8  PROT 7.0 7.3  ALBUMIN 3.9 3.8   No results for input(s): LIPASE,  AMYLASE in the last 168 hours. No results for input(s): AMMONIA in the last 168 hours. Coagulation Profile: Recent Labs  Lab 08/11/20 0905 08/14/20 1100  INR 2.1* 1.2   Cardiac Enzymes: No results for input(s): CKTOTAL, CKMB, CKMBINDEX, TROPONINI in the last 168 hours. BNP (last 3 results) No results for input(s): PROBNP in the last 8760 hours. HbA1C: No results for input(s): HGBA1C in the last 72 hours. CBG: Recent Labs  Lab 08/14/20 1037  GLUCAP 89   Lipid Profile: No results for input(s): CHOL, HDL, LDLCALC, TRIG, CHOLHDL, LDLDIRECT in the last 72 hours. Thyroid Function Tests: No results for input(s): TSH, T4TOTAL, FREET4, T3FREE, THYROIDAB in the last 72 hours. Anemia Panel: Recent Labs    08/13/20 0440  VITAMINB12 292  FOLATE 37.8  FERRITIN 23  TIBC 335  IRON 37  RETICCTPCT 1.6   Urine analysis:    Component Value Date/Time   COLORURINE YELLOW 05/18/2017 2009   APPEARANCEUR CLEAR 05/18/2017 2009   LABSPEC 1.035 (H) 05/18/2017 2009   PHURINE 6.0 05/18/2017 2009   GLUCOSEU NEGATIVE 05/18/2017 2009   HGBUR NEGATIVE 05/18/2017 2009   BILIRUBINUR NEGATIVE 05/18/2017 2009   [place] NEGATIVE 05/18/2017 2009   PROTEINUR 100 (A) 05/18/2017 2009   UROBILINOGEN >8.0 (H) 07/19/2014 0929   NITRITE NEGATIVE 05/18/2017 2009   LEUKOCYTESUR NEGATIVE 05/18/2017 2009    Radiological Exams on Admission: CT Angio Head W or Wo Contrast  Result Date: 08/14/2020 CLINICAL DATA:  Slurred speech. Recent GI bleeding and discharge. Atrial fibrillation EXAM: CT ANGIOGRAPHY HEAD AND NECK TECHNIQUE: Multidetector CT imaging of the head and neck was performed using the standard protocol during bolus administration of intravenous contrast. Multiplanar CT image reconstructions and MIPs were obtained to evaluate the vascular anatomy. Carotid stenosis measurements (when applicable) are obtained utilizing NASCET criteria, using the distal internal carotid diameter as the denominator.  CONTRAST:  73mL OMNIPAQUE IOHEXOL 350 MG/ML SOLN COMPARISON:  Brain MRI 05/19/2017 FINDINGS: CT  HEAD FINDINGS Brain: Moderate area of cytotoxic edema appearance involving the anterior right insula and frontal operculum. No hemorrhage, hydrocephalus, or masslike finding. Vascular: See below Skull: Unremarkable Sinuses: Mild opacification of the left sphenoid sinus, incidental. Orbits: Negative Review of the MIP images confirms the above findings CTA NECK FINDINGS Aortic arch: Atheromatous calcification.  Two vessel branching. Right carotid system: Mild, especially for age, atheromatous plaque at the bifurcation without stenosis or ulceration. Left carotid system: Mild atheromatous plaque at the bifurcation. There is ICA tortuosity with kinking. No dissection or beading. Vertebral arteries: No proximal subclavian stenosis. The codominant vertebral arteries are widely patent. Small outpouching laterally from the V2 segment on the right. No generalized beading or dissection flap. Skeleton: Lower cervical disc degeneration and generalized degenerative facet spurring. No acute or aggressive finding. Other neck: Negative Upper chest: Mild emphysema. Review of the MIP images confirms the above findings CTA HEAD FINDINGS Anterior circulation: Atheromatous calcification on the carotid siphons. Right M2 branch occlusion/pre occlusion with faint reconstitution and then subsequent reocclusion at the M3 level, correlating with the level of infarct. Regional M3/4 branch is appear asymmetrically irregular on MIPS. No contralateral embolism is noted. Negative for aneurysm Posterior circulation: The vertebral and basilar arteries are smooth and diffusely patent. Fetal type right PCA. No branch occlusion, beading, or aneurysm. Venous sinuses: Diffusely patent Anatomic variants: None significant Review of the MIP images confirms the above findings IMPRESSION: 1. Right M2/3 branch occlusion with completed insular and opercular  infarction. Regional M3/4 branch is appear asymmetrically irregular, likely from showering of smaller emboli in this territory. 2. Mild for age atherosclerosis in the head and neck. Electronically Signed   By: Monte Fantasia M.D.   On: 08/14/2020 12:30   CT Angio Neck W and/or Wo Contrast  Result Date: 08/14/2020 CLINICAL DATA:  Slurred speech. Recent GI bleeding and discharge. Atrial fibrillation EXAM: CT ANGIOGRAPHY HEAD AND NECK TECHNIQUE: Multidetector CT imaging of the head and neck was performed using the standard protocol during bolus administration of intravenous contrast. Multiplanar CT image reconstructions and MIPs were obtained to evaluate the vascular anatomy. Carotid stenosis measurements (when applicable) are obtained utilizing NASCET criteria, using the distal internal carotid diameter as the denominator. CONTRAST:  5mL OMNIPAQUE IOHEXOL 350 MG/ML SOLN COMPARISON:  Brain MRI 05/19/2017 FINDINGS: CT HEAD FINDINGS Brain: Moderate area of cytotoxic edema appearance involving the anterior right insula and frontal operculum. No hemorrhage, hydrocephalus, or masslike finding. Vascular: See below Skull: Unremarkable Sinuses: Mild opacification of the left sphenoid sinus, incidental. Orbits: Negative Review of the MIP images confirms the above findings CTA NECK FINDINGS Aortic arch: Atheromatous calcification.  Two vessel branching. Right carotid system: Mild, especially for age, atheromatous plaque at the bifurcation without stenosis or ulceration. Left carotid system: Mild atheromatous plaque at the bifurcation. There is ICA tortuosity with kinking. No dissection or beading. Vertebral arteries: No proximal subclavian stenosis. The codominant vertebral arteries are widely patent. Small outpouching laterally from the V2 segment on the right. No generalized beading or dissection flap. Skeleton: Lower cervical disc degeneration and generalized degenerative facet spurring. No acute or aggressive finding.  Other neck: Negative Upper chest: Mild emphysema. Review of the MIP images confirms the above findings CTA HEAD FINDINGS Anterior circulation: Atheromatous calcification on the carotid siphons. Right M2 branch occlusion/pre occlusion with faint reconstitution and then subsequent reocclusion at the M3 level, correlating with the level of infarct. Regional M3/4 branch is appear asymmetrically irregular on MIPS. No contralateral embolism is noted. Negative for aneurysm  Posterior circulation: The vertebral and basilar arteries are smooth and diffusely patent. Fetal type right PCA. No branch occlusion, beading, or aneurysm. Venous sinuses: Diffusely patent Anatomic variants: None significant Review of the MIP images confirms the above findings IMPRESSION: 1. Right M2/3 branch occlusion with completed insular and opercular infarction. Regional M3/4 branch is appear asymmetrically irregular, likely from showering of smaller emboli in this territory. 2. Mild for age atherosclerosis in the head and neck. Electronically Signed   By: Monte Fantasia M.D.   On: 08/14/2020 12:30   NM GI Blood Loss  Result Date: 08/12/2020 CLINICAL DATA:  Lower GI bleed EXAM: NUCLEAR MEDICINE GASTROINTESTINAL BLEEDING SCAN TECHNIQUE: Sequential abdominal images were obtained following intravenous administration of Tc-61m labeled red blood cells. RADIOPHARMACEUTICALS:  22.0 mCi Tc-48m pertechnetate in-vitro labeled red cells. COMPARISON:  None FINDINGS: Following the IV administration of the radiopharmaceutical dynamic imaging was performed for 1 hour and 20 minutes. No signs of active GI bleed identified. IMPRESSION: 1. Negative for active GI bleed. Electronically Signed   By: Kerby Moors M.D.   On: 08/12/2020 14:53    EKG: Independently reviewed.  Chronic A. fib  Assessment/Plan Active Problems:   CVA (cerebral vascular accident) ([place])  (please populate well all problems here in Problem List. (For example, if patient is on BP  meds at home and you resume or decide to hold them, it is a problem that needs to be her. Same for CAD, COPD, HLD and so on)  Right MCA embolic infarct -Significant aphasia, and likely some level of dysphagia -Probably related to her anticoagulation is being held for significant GI bleed episodes.  Given the bleeding source likely to be diverticulosis which had resolved and patient H&H remained stable, agree with heparin drip for now, until she is able to swallow. -Heparin drip -Allow permissive HTN -Long discussion with patient and her daughter at bedside, seems like lyrics before embolic stroke is high, meanwhile it looks like the rebleeding from diverticulosis as high as well as patient has had 2 episodes of major lower GI bleed this year.  Reviewed image of the abdomen with patient and her family at bedside, appears patient has extensive diverticulosis throughout colon which makes surgery correction challenging, patient daughter expressed that she does not consider any additional surgery intervention. Then I discussed with him likely patient have to accept the risk of continue to have recurrent episodes of GI bleed while on continuous systemic anticoagulation, patient and daughter understood and agreed. -IVF until patient can eat and swallow -Overall prognosis guarded given the dilemma of high risk of bleed on anticoagulation and significant risk of embolic stroke without anticoagulation even for short term.  HTN -As above -Hold lasix for today given that she will be on IVF.  HLD -Check Lipid panel  Chronic A-fib - PRN Lopressor until she can swallow pills.  Diverticulosis -As above  Chronic iron deficiency anemia -On iron supplement.  COPD -Stable  DVT prophylaxis: Heparin drip Code Status: Ok with intubation but no chest compression Family Communication: *Daughter at bedside Disposition Plan: Probably will need more than 2 midnight hospital stay for PT and speech improvement,  may need SNF Consults called: Neuro Admission status: Tele admit   Lequita Halt MD [facility] Pager [phone]  08/14/2020, 1:14 PM

## 2020-08-14 NOTE — Consult Note (Signed)
Reason for Consult:stroke Referring Physician:Scott Briona Barnett Amber Barnett is an 86 y.o. female.  Chronic atrial fibrillation on long-term anticoagulation with Xarelto, hypertension, DVT, stroke presented sudden onset of slurred speech and facial droop initially noted yesterday evening.  Patient was speaking to a friend noticed it but did not seek medical help.  This morning when the daughter talked to the patient she had obvious slurred speech and facial droop.  She denies any extremity weakness numbness gait or balance problems.  Patient was recently admitted on 08/11/2020 with dark-colored stools which were felt to be diverticular bleed.  Xarelto was held and she was discharged yesterday with plans to resume Xarelto today which is on heparin so far.  She has undergone CT scan of the head noncontrast study in the ER which I personally reviewed showed low-density in the right insula consistent with an acute infarct she also underwent CT angiogram of brain and neck which shows Right M2 branch occlusion/pre occlusion with faint reconstitution and then subsequent reocclusion at the M3 level,correlating with the level of infarct. Regional M3/4 branch is appear asymmetrically irregular on MIPS. 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 fib treated with successful mechanical thrombectomy with complete recanalization and excellent clinical recovery.  She has a history of GI bleed in May 2021 Xarelto was held but subsequently she developed bilateral popliteal artery embolisms requiring bilateral popliteal thrombectomies by vascular surgery. She had done well since then on Xarelto until few days ago when she was readmitted for lower GI bleed and Xarelto was held Last seen normal at 7 PM 08/13/2020 IV TPA considered no as presenting outside time window and recent lower GI bleed Mechanical thrombectomy no as NIH stroke  scale is only 4 and right M2 branch occlusion may be too distal for thrombectomy  Past Medical History:  Diagnosis Date   Atrial fibrillation ([place])    DVT (deep venous thrombosis) ([place])    Dysrhythmia    History of left breast cancer    Hypertension    Stroke [facility])     Past Surgical History:  Procedure Laterality Date   COLONOSCOPY WITH PROPOFOL N/A 12/29/2019   Procedure: COLONOSCOPY WITH PROPOFOL;  Surgeon: Arta Silence, MD;  Location: [facility];  Service: Endoscopy;  Laterality: N/A;   IR ANGIO INTRA EXTRACRAN SEL COM CAROTID INNOMINATE UNI R MOD SED  05/18/2017   IR ANGIO VERTEBRAL SEL SUBCLAVIAN INNOMINATE UNI R MOD SED  05/18/2017   IR PERCUTANEOUS ART THROMBECTOMY/INFUSION INTRACRANIAL INC DIAG ANGIO  05/18/2017   IR RADIOLOGIST EVAL & MGMT  07/19/2017   RADIOLOGY WITH ANESTHESIA N/A 05/18/2017   Procedure: RADIOLOGY WITH ANESTHESIA;  Surgeon: Luanne Bras, MD;  Location: [facility];  Service: Radiology;  Laterality: N/A;    Family History  Problem Relation Age of Onset   Stroke Son     Social History:  reports that she has quit smoking. Her smoking use included cigarettes. She has never used smokeless tobacco. She reports that she does not drink alcohol and does not use drugs.  Allergies:  Allergies  Allergen Reactions   Fish-Derived Products Swelling    Facial swelling then tongue swelling   Lactuca Virosa Diarrhea   Milk-Related Compounds Diarrhea    WHOLE MILK   Other Swelling    Allergy to all nuts - facial swelling, then tongue swelling   Amoxicillin Nausea And Vomiting    Medications: I have reviewed the patient's  current medications.  Results for orders placed or performed during the hospital encounter of 08/14/20 (from the past 48 hour(s))  CBG monitoring, ED     Status: None   Collection Time: 08/14/20 10:37 AM  Result Value Ref Range   Glucose-Capillary 89 70 - 99 mg/dL    Comment: Glucose reference range applies only to samples  taken after fasting for at least 8 hours.  Ethanol     Status: None   Collection Time: 08/14/20 11:00 AM  Result Value Ref Range   Alcohol, Ethyl (B) <10 <10 mg/dL    Comment: (NOTE) Lowest detectable limit for serum alcohol is 10 mg/dL.  For medical purposes only. Performed at [facility], [place] [address], [place], [place] [address]   Protime-INR     Status: None   Collection Time: 08/14/20 11:00 AM  Result Value Ref Range   Prothrombin Time 14.8 11.4 - 15.2 seconds   INR 1.2 0.8 - 1.2    Comment: (NOTE) INR goal varies based on device and disease states. Performed at [facility], [place] [address], [place], [place] [address]   APTT     Status: None   Collection Time: 08/14/20 11:00 AM  Result Value Ref Range   aPTT 26 24 - 36 seconds    Comment: Performed at [facility] [address] [place], [place] [address]  CBC     Status: Abnormal   Collection Time: 08/14/20 11:00 AM  Result Value Ref Range   WBC 6.8 4.0 - 10.5 K/uL   RBC 4.14 3.87 - 5.11 MIL/uL   Hemoglobin 10.4 (L) 12.0 - 15.0 g/dL   HCT 34.1 (L) 36.0 - 46.0 %   MCV 82.4 80.0 - 100.0 fL   MCH 25.1 (L) 26.0 - 34.0 pg   MCHC 30.5 30.0 - 36.0 g/dL   RDW 18.2 (H) 11.5 - 15.5 %   Platelets 195 150 - 400 K/uL   nRBC 0.0 0.0 - 0.2 %    Comment: Performed at [facility], [place] [address], [place], [place] [address]  Differential     Status: None   Collection Time: 08/14/20 11:00 AM  Result Value Ref Range   Neutrophils Relative % 66 %   Neutro Abs 4.5 1.7 - 7.7 K/uL   Lymphocytes Relative 24 %   Lymphs Abs 1.7 0.7 - 4.0 K/uL   Monocytes Relative 8 %   Monocytes Absolute 0.6 0.1 - 1.0 K/uL   Eosinophils Relative 1 %   Eosinophils Absolute 0.1 0.0 - 0.5 K/uL   Basophils Relative 1 %   Basophils Absolute 0.0 0.0 - 0.1 K/uL   Immature Granulocytes 0 %   Abs Immature Granulocytes 0.02 0.00 - 0.07 K/uL    Comment: Performed at [facility] [address], [place], [place] [address]   Comprehensive metabolic panel     Status: Abnormal   Collection Time: 08/14/20 11:00 AM  Result Value Ref Range   Sodium 138 135 - 145 mmol/L   Potassium 3.7 3.5 - 5.1 mmol/L   Chloride 107 98 - 111 mmol/L   CO2 22 22 - 32 mmol/L   Glucose, Bld 93 70 - 99 mg/dL    Comment: Glucose reference range applies only to samples taken after fasting for at least 8 hours.   BUN 14 8 - 23 mg/dL   Creatinine, Ser 1.25 (H) 0.44 - 1.00 mg/dL   Calcium 9.0 8.9 - 10.3 mg/dL  Total Protein 7.3 6.5 - 8.1 g/dL   Albumin 3.8 3.5 - 5.0 g/dL   AST 18 15 - 41 U/L   ALT 16 0 - 44 U/L   Alkaline Phosphatase 57 38 - 126 U/L   Total Bilirubin 0.8 0.3 - 1.2 mg/dL   GFR, Estimated 43 (L) >60 mL/min    Comment: (NOTE) Calculated using the CKD-EPI Creatinine Equation (2021)    Anion gap 9 5 - 15    Comment: Performed at [facility] [address], [place], [place] [address]  I-stat chem 8, ED     Status: Abnormal   Collection Time: 08/14/20 11:16 AM  Result Value Ref Range   Sodium 142 135 - 145 mmol/L   Potassium 4.1 3.5 - 5.1 mmol/L   Chloride 109 98 - 111 mmol/L   BUN 17 8 - 23 mg/dL   Creatinine, Ser 1.20 (H) 0.44 - 1.00 mg/dL   Glucose, Bld 90 70 - 99 mg/dL    Comment: Glucose reference range applies only to samples taken after fasting for at least 8 hours.   Calcium, Ion 1.10 (L) 1.15 - 1.40 mmol/L   TCO2 24 22 - 32 mmol/L   Hemoglobin 11.6 (L) 12.0 - 15.0 g/dL   HCT 34.0 (L) 36.0 - 46.0 %    CT Angio Head W or Wo Contrast  Result Date: 08/14/2020 CLINICAL DATA:  Slurred speech. Recent GI bleeding and discharge. Atrial fibrillation EXAM: CT ANGIOGRAPHY HEAD AND NECK TECHNIQUE: Multidetector CT imaging of the head and neck was performed using the standard protocol during bolus administration of intravenous contrast. Multiplanar CT image reconstructions and MIPs were obtained to evaluate the vascular anatomy. Carotid stenosis measurements (when applicable) are obtained utilizing NASCET  criteria, using the distal internal carotid diameter as the denominator. CONTRAST:  15mL OMNIPAQUE IOHEXOL 350 MG/ML SOLN COMPARISON:  Brain MRI 05/19/2017 FINDINGS: CT HEAD FINDINGS Brain: Moderate area of cytotoxic edema appearance involving the anterior right insula and frontal operculum. No hemorrhage, hydrocephalus, or masslike finding. Vascular: See below Skull: Unremarkable Sinuses: Mild opacification of the left sphenoid sinus, incidental. Orbits: Negative Review of the MIP images confirms the above findings CTA NECK FINDINGS Aortic arch: Atheromatous calcification.  Two vessel branching. Right carotid system: Mild, especially for age, atheromatous plaque at the bifurcation without stenosis or ulceration. Left carotid system: Mild atheromatous plaque at the bifurcation. There is ICA tortuosity with kinking. No dissection or beading. Vertebral arteries: No proximal subclavian stenosis. The codominant vertebral arteries are widely patent. Small outpouching laterally from the V2 segment on the right. No generalized beading or dissection flap. Skeleton: Lower cervical disc degeneration and generalized degenerative facet spurring. No acute or aggressive finding. Other neck: Negative Upper chest: Mild emphysema. Review of the MIP images confirms the above findings CTA HEAD FINDINGS Anterior circulation: Atheromatous calcification on the carotid siphons. Right M2 branch occlusion/pre occlusion with faint reconstitution and then subsequent reocclusion at the M3 level, correlating with the level of infarct. Regional M3/4 branch is appear asymmetrically irregular on MIPS. No contralateral embolism is noted. Negative for aneurysm Posterior circulation: The vertebral and basilar arteries are smooth and diffusely patent. Fetal type right PCA. No branch occlusion, beading, or aneurysm. Venous sinuses: Diffusely patent Anatomic variants: None significant Review of the MIP images confirms the above findings IMPRESSION: 1.  Right M2/3 branch occlusion with completed insular and opercular infarction. Regional M3/4 branch is appear asymmetrically irregular, likely from showering of smaller emboli in this territory. 2. Mild for  age atherosclerosis in the head and neck. Electronically Signed   By: Monte Fantasia M.D.   On: 08/14/2020 12:30   CT Angio Neck W and/or Wo Contrast  Result Date: 08/14/2020 CLINICAL DATA:  Slurred speech. Recent GI bleeding and discharge. Atrial fibrillation EXAM: CT ANGIOGRAPHY HEAD AND NECK TECHNIQUE: Multidetector CT imaging of the head and neck was performed using the standard protocol during bolus administration of intravenous contrast. Multiplanar CT image reconstructions and MIPs were obtained to evaluate the vascular anatomy. Carotid stenosis measurements (when applicable) are obtained utilizing NASCET criteria, using the distal internal carotid diameter as the denominator. CONTRAST:  55mL OMNIPAQUE IOHEXOL 350 MG/ML SOLN COMPARISON:  Brain MRI 05/19/2017 FINDINGS: CT HEAD FINDINGS Brain: Moderate area of cytotoxic edema appearance involving the anterior right insula and frontal operculum. No hemorrhage, hydrocephalus, or masslike finding. Vascular: See below Skull: Unremarkable Sinuses: Mild opacification of the left sphenoid sinus, incidental. Orbits: Negative Review of the MIP images confirms the above findings CTA NECK FINDINGS Aortic arch: Atheromatous calcification.  Two vessel branching. Right carotid system: Mild, especially for age, atheromatous plaque at the bifurcation without stenosis or ulceration. Left carotid system: Mild atheromatous plaque at the bifurcation. There is ICA tortuosity with kinking. No dissection or beading. Vertebral arteries: No proximal subclavian stenosis. The codominant vertebral arteries are widely patent. Small outpouching laterally from the V2 segment on the right. No generalized beading or dissection flap. Skeleton: Lower cervical disc degeneration and  generalized degenerative facet spurring. No acute or aggressive finding. Other neck: Negative Upper chest: Mild emphysema. Review of the MIP images confirms the above findings CTA HEAD FINDINGS Anterior circulation: Atheromatous calcification on the carotid siphons. Right M2 branch occlusion/pre occlusion with faint reconstitution and then subsequent reocclusion at the M3 level, correlating with the level of infarct. Regional M3/4 branch is appear asymmetrically irregular on MIPS. No contralateral embolism is noted. Negative for aneurysm Posterior circulation: The vertebral and basilar arteries are smooth and diffusely patent. Fetal type right PCA. No branch occlusion, beading, or aneurysm. Venous sinuses: Diffusely patent Anatomic variants: None significant Review of the MIP images confirms the above findings IMPRESSION: 1. Right M2/3 branch occlusion with completed insular and opercular infarction. Regional M3/4 branch is appear asymmetrically irregular, likely from showering of smaller emboli in this territory. 2. Mild for age atherosclerosis in the head and neck. Electronically Signed   By: Monte Fantasia M.D.   On: 08/14/2020 12:30   NM GI Blood Loss  Result Date: 08/12/2020 CLINICAL DATA:  Lower GI bleed EXAM: NUCLEAR MEDICINE GASTROINTESTINAL BLEEDING SCAN TECHNIQUE: Sequential abdominal images were obtained following intravenous administration of Tc-46m labeled red blood cells. RADIOPHARMACEUTICALS:  22.0 mCi Tc-75m pertechnetate in-vitro labeled red cells. COMPARISON:  None FINDINGS: Following the IV administration of the radiopharmaceutical dynamic imaging was performed for 1 hour and 20 minutes. No signs of active GI bleed identified. IMPRESSION: 1. Negative for active GI bleed. Electronically Signed   By: Kerby Moors M.D.   On: 08/12/2020 14:53      ROS Blood pressure 130/78, pulse 85, temperature 99.2 F (37.3 C), temperature source Oral, resp. rate 19, height 5\' 6"  (1.676 m), weight 59  kg, SpO2 98 %. Physical Exam Pleasant frail elderly African-American lady not in distress. . Afebrile. Head is nontraumatic. Neck is supple without bruit.    Cardiac exam no murmur or gallop. Lungs are clear to auscultation. Distal pulses are well felt. Neurological Exam: She is awake alert oriented to time place and person.  Severe dysarthria  but can be understood.  Follows commands well.  No aphasia.  Extraocular movements are full range without nystagmus.  She blinks to threat bilaterally.  Moderate left lower facial weakness.  Tongue midline.  Motor system exam shows no upper or lower extremity drift with only mild weakness of left grip and intrinsic hand muscles.  Orbits right over left upper extremity.  Lower extremity strength is symmetric.  Sensation intact.  Coordination normal.  Gait not tested.  NIH stroke scale 4 premorbid modified Rankin scale 1  Assessment   86 year old lady with sudden onset of slurred speech and left facial droop secondary to right MCA branch embolic infarct from atrial fibrillation while anticoagulation was recently held for lower GI bleed.  Prior history of left MCA infarct from A. fib s/p successful mechanical thrombectomy for left M1 occlusion in 2018 with excellent clinical recovery.  Vascular risk factors of atrial fibrillation, hypertension and hyperlipidemia. Patient has presented within 24 hours of onset of symptoms but there is not a candidate for thrombectomy as an NIH stroke scale is too low and the M2/M3 branch occlusion appears to be too distal for thrombectomy Plan: Admit to the medical service.  IV heparin till patient is able to swallow safely then restart Xarelto.  Check MRI scan of the brain later, echocardiogram, lipid profile, hemoglobin A1c.  Physical occupational and speech therapy consults.  Monitor closely for recurrence of GI bleed on heparin. I had a long discussion with patient and daughter regarding the tricky situation of resuming  anticoagulation so soon after a recent lower GI bleed versus holding running high risk for recurrent strokes.  She may eventually need to be considered for watchman device for stroke prevention for A. fib as she is not a good candidate for long-term anticoagulation given 2 separate episodes of GI bleed in this area.  Discussed with Dr. Rogene Houston ER physician. This patient is critically ill and at significant risk of neurological worsening, death and care requires constant monitoring of vital signs, hemodynamics,respiratory and cardiac monitoring, extensive review of multiple databases, frequent neurological assessment, discussion with family, other specialists and medical decision making of high complexity.I have made any additions or clarifications directly to the above note.This critical care time does not reflect procedure time, or teaching time or supervisory time of PA/NP/Med Resident etc but could involve care discussion time.  I spent 50 minutes of neurocritical care time  in the care of  this patient.      Antony Contras 08/14/2020, 12:41 PM    Note: This document was prepared with digital dictation and possible smart phrase technology. Any transcriptional errors that result from this process are unintentional.

## 2020-08-14 NOTE — ED Notes (Signed)
Back from MRI.

## 2020-08-14 NOTE — ED Notes (Signed)
Off floor to CT 

## 2020-08-15 ENCOUNTER — Inpatient Hospital Stay (HOSPITAL_COMMUNITY): Payer: Medicare HMO

## 2020-08-15 DIAGNOSIS — I4821 Permanent atrial fibrillation: Secondary | ICD-10-CM

## 2020-08-15 DIAGNOSIS — I6300 Cerebral infarction due to thrombosis of unspecified precerebral artery: Secondary | ICD-10-CM

## 2020-08-15 DIAGNOSIS — I351 Nonrheumatic aortic (valve) insufficiency: Secondary | ICD-10-CM

## 2020-08-15 DIAGNOSIS — K922 Gastrointestinal hemorrhage, unspecified: Secondary | ICD-10-CM | POA: Diagnosis not present

## 2020-08-15 DIAGNOSIS — I6389 Other cerebral infarction: Secondary | ICD-10-CM

## 2020-08-15 DIAGNOSIS — I10 Essential (primary) hypertension: Secondary | ICD-10-CM | POA: Diagnosis not present

## 2020-08-15 DIAGNOSIS — I340 Nonrheumatic mitral (valve) insufficiency: Secondary | ICD-10-CM

## 2020-08-15 DIAGNOSIS — I361 Nonrheumatic tricuspid (valve) insufficiency: Secondary | ICD-10-CM

## 2020-08-15 LAB — ECHOCARDIOGRAM COMPLETE
Area-P 1/2: 5.13 cm2
Height: 66 in
MV M vel: 4.35 m/s
MV Peak grad: 75.7 mmHg
P 1/2 time: 588 msec
S' Lateral: 1.7 cm
Weight: 2080 oz

## 2020-08-15 LAB — CBC
HCT: 29 % — ABNORMAL LOW (ref 36.0–46.0)
Hemoglobin: 9.4 g/dL — ABNORMAL LOW (ref 12.0–15.0)
MCH: 25.8 pg — ABNORMAL LOW (ref 26.0–34.0)
MCHC: 32.4 g/dL (ref 30.0–36.0)
MCV: 79.7 fL — ABNORMAL LOW (ref 80.0–100.0)
Platelets: 201 10*3/uL (ref 150–400)
RBC: 3.64 MIL/uL — ABNORMAL LOW (ref 3.87–5.11)
RDW: 18.1 % — ABNORMAL HIGH (ref 11.5–15.5)
WBC: 6.3 10*3/uL (ref 4.0–10.5)
nRBC: 0 % (ref 0.0–0.2)

## 2020-08-15 LAB — HEMOGLOBIN AND HEMATOCRIT, BLOOD
HCT: 28.1 % — ABNORMAL LOW (ref 36.0–46.0)
HCT: 25 % — ABNORMAL LOW (ref 36.0–46.0)
Hemoglobin: 8.5 g/dL — ABNORMAL LOW (ref 12.0–15.0)
Hemoglobin: 7.9 g/dL — ABNORMAL LOW (ref 12.0–15.0)

## 2020-08-15 LAB — HEPARIN LEVEL (UNFRACTIONATED): Heparin Unfractionated: 0.53 IU/mL (ref 0.30–0.70)

## 2020-08-15 LAB — GLUCOSE, CAPILLARY: Glucose-Capillary: 134 mg/dL — ABNORMAL HIGH (ref 70–99)

## 2020-08-15 MED ORDER — TECHNETIUM TC 99M-LABELED RED BLOOD CELLS IV KIT
21.6000 | PACK | Freq: Once | INTRAVENOUS | Status: AC | PRN
  Administered 2020-08-15: 17:00:00 21.6 via INTRAVENOUS

## 2020-08-15 MED ORDER — SODIUM CHLORIDE 0.9 % IV SOLN
500.0000 mL | INTRAVENOUS | Status: DC
  Administered 2020-08-15 (×2): 500 mL via INTRAVENOUS

## 2020-08-15 NOTE — Progress Notes (Signed)
*  PRELIMINARY RESULTS* Echocardiogram 2D Echocardiogram has been performed.  Samuel Germany 08/15/2020, 3:40 PM

## 2020-08-15 NOTE — Assessment & Plan Note (Addendum)
-  History of diverticular bleed.  Just hospitalized from 12/22 to 12/24 for likely recurrent diverticular bleed, negative bleeding scan.  Discussion was held on admission regarding risks and benefits of ongoing anticoagulation.  Patient elected for continuing on anticoagulation given stroke however this morning she is developing worsening recurrent GI bleeding with hypotension; at this time we will discontinue her heparin drip -Check stat H/H: stable but downtrending -Stat bleeding scan ordered. Negative again for active bleed - Hgb trend: 11.6 g/dL on admission down to 7.8g/dL on morning of 12/27. Given underlying comorbidites and symptomatic, will transfuse 1 unit PRBC on 12/27 - Hgb up to 9.6 g/dL this am and no further bleeding off anticoagulation

## 2020-08-15 NOTE — Assessment & Plan Note (Addendum)
-  Cardizem and Xarelto on hold in setting of GI bleed and hypotension/bradycardia - resumed cardizem on 12/28 - now on eliquis 2.5 mg BID due to age and weight

## 2020-08-15 NOTE — Progress Notes (Signed)
PROGRESS NOTE    Amber Barnett   [id number]  DOB: April 25, 1934  DOA: 08/14/2020     1  PCP: Egbert Garibaldi, PA-C  CC: Dysarthria and facial droop  Hospital Course: [name] is an 86 yo female with PMH afib (on xarelto), HTN, diverticulosis, CVA (2018 s/p mech embolectomy and revasc), GIB, DVT who presented with severe dysarthria, left facial droop.  She was just hospitalized from 12/22 to 12/24 for hematochezia.  She underwent a bleeding scan which was negative.  Hemoglobin was monitored and remained stable; her Xarelto was held and resumed at discharge. Last colonoscopy was in May 2021 for rectal bleeding which showed internal hemorrhoids and diverticulosis with a negative bleeding scan at that time as well.  She underwent stroke work-up and was found to have a small to moderate sized acute right MCA infarct on MRI brain.  CTA head/neck showed right M2/3 branch occlusion with completed insular and opercular infarction.  A discussion was held on admission regarding bleed risk versus stroke risk.  Overall the patient and family elected to remain on anticoagulation and she was placed on a heparin drip.  The the night following admission and into the morning she developed 2 large bloody bowel movements with associated hypotension.  Her heparin drip was then discontinued and she underwent repeat bleeding scan.   Interval History:  Patient sitting up in chair this morning tearful due to episode of bloody stool in the toilet.  This was a bright red in nature.  We again discussed anticoagulation and she wished to remain on a heparin drip.  Her vitals were stable and a repeat hemoglobin was ordered for 12 PM.  However, later in the morning she again had an even larger bloody bowel movement and then developed hypotension and bradycardia.  Heparin drip was then discontinued due to risk outweighing benefit of ongoing anticoagulation.  Hemoglobin was ordered to be repeated at that  time as well as a bleeding scan ordered.  Old records reviewed in assessment of this patient  ROS: Constitutional: negative for chills and fevers, Respiratory: negative for cough, Cardiovascular: negative for chest pain and Gastrointestinal: positive for Bloody bowel movement  Assessment & Plan: * CVA (cerebral vascular accident) ([place]) -Unfortunately patient remains high risk for recurrent stroke; developed new CVA this hospitalization (was off Xarelto for approximately 2 to 3 days, 12/22-12/24 for LGIB) - MRI brain shows "Right M2/3 branch occlusion with completed insular and opercular infarction. Regional M3/4 branch is appear asymmetrically irregular, likely from showering of smaller emboli in this territory." - xarelto on hold; was on heparin on admission but now recurrent rectal bleeding (stopping heparin drip again) -Continue statin  GIB (gastrointestinal bleeding) -History of diverticular bleed.  Just hospitalized from 12/22 to 12/24 for likely recurrent diverticular bleed, negative bleeding scan.  Discussion was held on admission regarding risks and benefits of ongoing anticoagulation.  Patient elected for continuing on anticoagulation given stroke however this morning she is developing worsening recurrent GI bleeding with hypotension; at this time we will discontinue her heparin drip -Check stat H/H -Stat bleeding scan ordered.  Depending on results, will involve GI for further input and evaluation  Permanent atrial fibrillation (HCC) -Cardizem and Xarelto on hold in setting of GI bleed and hypotension/bradycardia  Essential hypertension -Hold Cardizem and metoprolol in setting of hypotension    Antimicrobials: n/a  DVT prophylaxis: Heparin drip now discontinued Code Status: Intubation but no CPR Family Communication: none present this am Disposition Plan: Status is: Inpatient  Remains inpatient appropriate because:Hemodynamically unstable, Ongoing diagnostic testing  needed not appropriate for outpatient work up, Unsafe d/c plan, IV treatments appropriate due to intensity of illness or inability to take PO and Inpatient level of care appropriate due to severity of illness   Dispo: The patient is from: Home              Anticipated d/c is to: Pending PT eval              Anticipated d/c date is: > 3 days              Patient currently is not medically stable to d/c.       Objective: Blood pressure 123/66, pulse 68, temperature 98.1 F (36.7 C), temperature source Oral, resp. rate (!) 22, height 5\' 6"  (1.676 m), weight 59 kg, SpO2 100 %.  Examination: General appearance: Mild ongoing dysarthria.  No distress but is tearful and upset due to recent bleeding Head: Normocephalic, without obvious abnormality, atraumatic Eyes: EOMI Lungs: clear to auscultation bilaterally Heart: regular rate and rhythm and S1, S2 normal Abdomen: normal findings: bowel sounds normal and soft, non-tender Extremities: No edema Skin: mobility and turgor normal Neurologic: Mild dysarthria.  No obvious aphasia.  Strength and sensation intact throughout  Consultants:   Neuro   Procedures:     Data Reviewed: I have personally reviewed following labs and imaging studies Results for orders placed or performed during the hospital encounter of 08/14/20 (from the past 24 hour(s))  I-stat chem 8, ED     Status: Abnormal   Collection Time: 08/14/20 11:16 AM  Result Value Ref Range   Sodium 142 135 - 145 mmol/L   Potassium 4.1 3.5 - 5.1 mmol/L   Chloride 109 98 - 111 mmol/L   BUN 17 8 - 23 mg/dL   Creatinine, Ser 1.611.20 (H) 0.44 - 1.00 mg/dL   Glucose, Bld 90 70 - 99 mg/dL   Calcium, Ion 0.961.10 (L) 1.15 - 1.40 mmol/L   TCO2 24 22 - 32 mmol/L   Hemoglobin 11.6 (L) 12.0 - 15.0 g/dL   HCT 04.534.0 (L) 40.936.0 - 81.146.0 %  Resp Panel by RT-PCR (Flu A&B, Covid) Nasopharyngeal Swab     Status: None   Collection Time: 08/14/20 12:35 PM   Specimen: Nasopharyngeal Swab; Nasopharyngeal(NP)  swabs in vial transport medium  Result Value Ref Range   SARS Coronavirus 2 by RT PCR NEGATIVE NEGATIVE   Influenza A by PCR NEGATIVE NEGATIVE   Influenza B by PCR NEGATIVE NEGATIVE  Urine rapid drug screen (hosp performed)     Status: Abnormal   Collection Time: 08/14/20  2:58 PM  Result Value Ref Range   Opiates NONE DETECTED NONE DETECTED   Cocaine NONE DETECTED NONE DETECTED   Benzodiazepines POSITIVE (A) NONE DETECTED   Amphetamines NONE DETECTED NONE DETECTED   Tetrahydrocannabinol NONE DETECTED NONE DETECTED   Barbiturates NONE DETECTED NONE DETECTED  Urinalysis, Routine w reflex microscopic Urine, Clean Catch     Status: Abnormal   Collection Time: 08/14/20  2:58 PM  Result Value Ref Range   Color, Urine YELLOW YELLOW   APPearance HAZY (A) CLEAR   Specific Gravity, Urine 1.035 (H) 1.005 - 1.030   pH 7.0 5.0 - 8.0   Glucose, UA NEGATIVE NEGATIVE mg/dL   Hgb urine dipstick NEGATIVE NEGATIVE   Bilirubin Urine NEGATIVE NEGATIVE   Ketones, ur NEGATIVE NEGATIVE mg/dL   Protein, ur 30 (A) NEGATIVE mg/dL   Nitrite NEGATIVE NEGATIVE  Leukocytes,Ua LARGE (A) NEGATIVE   RBC / HPF 0-5 0 - 5 RBC/hpf   WBC, UA 11-20 0 - 5 WBC/hpf   Bacteria, UA NONE SEEN NONE SEEN   Squamous Epithelial / LPF 0-5 0 - 5   Mucus PRESENT   Heparin level (unfractionated)     Status: Abnormal   Collection Time: 08/14/20 10:14 PM  Result Value Ref Range   Heparin Unfractionated 0.26 (L) 0.30 - 0.70 IU/mL  CBC     Status: Abnormal   Collection Time: 08/15/20  6:43 AM  Result Value Ref Range   WBC 6.3 4.0 - 10.5 K/uL   RBC 3.64 (L) 3.87 - 5.11 MIL/uL   Hemoglobin 9.4 (L) 12.0 - 15.0 g/dL   HCT 29.0 (L) 36.0 - 46.0 %   MCV 79.7 (L) 80.0 - 100.0 fL   MCH 25.8 (L) 26.0 - 34.0 pg   MCHC 32.4 30.0 - 36.0 g/dL   RDW 18.1 (H) 11.5 - 15.5 %   Platelets 201 150 - 400 K/uL   nRBC 0.0 0.0 - 0.2 %  Heparin level (unfractionated)     Status: None   Collection Time: 08/15/20  6:43 AM  Result Value Ref Range    Heparin Unfractionated 0.53 0.30 - 0.70 IU/mL  Glucose, capillary     Status: Abnormal   Collection Time: 08/15/20 10:21 AM  Result Value Ref Range   Glucose-Capillary 134 (H) 70 - 99 mg/dL    Recent Results (from the past 240 hour(s))  Resp Panel by RT-PCR (Flu A&B, Covid) Nasopharyngeal Swab     Status: None   Collection Time: 08/11/20  9:44 AM   Specimen: Nasopharyngeal Swab; Nasopharyngeal(NP) swabs in vial transport medium  Result Value Ref Range Status   SARS Coronavirus 2 by RT PCR NEGATIVE NEGATIVE Final    Comment: (NOTE) SARS-CoV-2 target nucleic acids are NOT DETECTED.  The SARS-CoV-2 RNA is generally detectable in upper respiratory specimens during the acute phase of infection. The lowest concentration of SARS-CoV-2 viral copies this assay can detect is 138 copies/mL. A negative result does not preclude SARS-Cov-2 infection and should not be used as the sole basis for treatment or other patient management decisions. A negative result may occur with  improper specimen collection/handling, submission of specimen other than nasopharyngeal swab, presence of viral mutation(s) within the areas targeted by this assay, and inadequate number of viral copies(<138 copies/mL). A negative result must be combined with clinical observations, patient history, and epidemiological information. The expected result is Negative.  Fact Sheet for Patients:  EntrepreneurPulse.com.au  Fact Sheet for Healthcare Providers:  IncredibleEmployment.be  This test is no t yet approved or cleared by the [place] FDA and  has been authorized for detection and/or diagnosis of SARS-CoV-2 by FDA under an Emergency Use Authorization (EUA). This EUA will remain  in effect (meaning this test can be used) for the duration of the COVID-19 declaration under Section 564(b)(1) of the Act, 21 U.S.C.section 360bbb-3(b)(1), unless the authorization is terminated  or  revoked sooner.       Influenza A by PCR NEGATIVE NEGATIVE Final   Influenza B by PCR NEGATIVE NEGATIVE Final    Comment: (NOTE) The Xpert Xpress SARS-CoV-2/FLU/RSV plus assay is intended as an aid in the diagnosis of influenza from Nasopharyngeal swab specimens and should not be used as a sole basis for treatment. Nasal washings and aspirates are unacceptable for Xpert Xpress SARS-CoV-2/FLU/RSV testing.  Fact Sheet for Patients: EntrepreneurPulse.com.au  Fact Sheet for Healthcare Providers:  IncredibleEmployment.be  This test is not yet approved or cleared by the [place] and has been authorized for detection and/or diagnosis of SARS-CoV-2 by FDA under an Emergency Use Authorization (EUA). This EUA will remain in effect (meaning this test can be used) for the duration of the COVID-19 declaration under Section 564(b)(1) of the Act, 21 U.S.C. section 360bbb-3(b)(1), unless the authorization is terminated or revoked.  Performed at [facility], [place] [address] [place], [place] [address]   Resp Panel by RT-PCR (Flu A&B, Covid) Nasopharyngeal Swab     Status: None   Collection Time: 08/14/20 12:35 PM   Specimen: Nasopharyngeal Swab; Nasopharyngeal(NP) swabs in vial transport medium  Result Value Ref Range Status   SARS Coronavirus 2 by RT PCR NEGATIVE NEGATIVE Final    Comment: (NOTE) SARS-CoV-2 target nucleic acids are NOT DETECTED.  The SARS-CoV-2 RNA is generally detectable in upper respiratory specimens during the acute phase of infection. The lowest concentration of SARS-CoV-2 viral copies this assay can detect is 138 copies/mL. A negative result does not preclude SARS-Cov-2 infection and should not be used as the sole basis for treatment or other patient management decisions. A negative result may occur with  improper specimen collection/handling, submission of specimen other than nasopharyngeal swab,  presence of viral mutation(s) within the areas targeted by this assay, and inadequate number of viral copies(<138 copies/mL). A negative result must be combined with clinical observations, patient history, and epidemiological information. The expected result is Negative.  Fact Sheet for Patients:  EntrepreneurPulse.com.au  Fact Sheet for Healthcare Providers:  IncredibleEmployment.be  This test is no t yet approved or cleared by the [place] FDA and  has been authorized for detection and/or diagnosis of SARS-CoV-2 by FDA under an Emergency Use Authorization (EUA). This EUA will remain  in effect (meaning this test can be used) for the duration of the COVID-19 declaration under Section 564(b)(1) of the Act, 21 U.S.C.section 360bbb-3(b)(1), unless the authorization is terminated  or revoked sooner.       Influenza A by PCR NEGATIVE NEGATIVE Final   Influenza B by PCR NEGATIVE NEGATIVE Final    Comment: (NOTE) The Xpert Xpress SARS-CoV-2/FLU/RSV plus assay is intended as an aid in the diagnosis of influenza from Nasopharyngeal swab specimens and should not be used as a sole basis for treatment. Nasal washings and aspirates are unacceptable for Xpert Xpress SARS-CoV-2/FLU/RSV testing.  Fact Sheet for Patients: EntrepreneurPulse.com.au  Fact Sheet for Healthcare Providers: IncredibleEmployment.be  This test is not yet approved or cleared by the [place] FDA and has been authorized for detection and/or diagnosis of SARS-CoV-2 by FDA under an Emergency Use Authorization (EUA). This EUA will remain in effect (meaning this test can be used) for the duration of the COVID-19 declaration under Section 564(b)(1) of the Act, 21 U.S.C. section 360bbb-3(b)(1), unless the authorization is terminated or revoked.  Performed at [facility], [place] [address], [place], [place] [address]      Radiology  Studies: CT Angio Head W or Wo Contrast  Result Date: 08/14/2020 CLINICAL DATA:  Slurred speech. Recent GI bleeding and discharge. Atrial fibrillation EXAM: CT ANGIOGRAPHY HEAD AND NECK TECHNIQUE: Multidetector CT imaging of the head and neck was performed using the standard protocol during bolus administration of intravenous contrast. Multiplanar CT image reconstructions and MIPs were obtained to evaluate the vascular anatomy. Carotid stenosis measurements (when applicable) are obtained utilizing NASCET criteria, using the distal internal carotid diameter as the denominator. CONTRAST:  69mL OMNIPAQUE IOHEXOL 350 MG/ML SOLN COMPARISON:  Brain MRI 05/19/2017 FINDINGS: CT HEAD FINDINGS Brain: Moderate area of cytotoxic edema appearance involving the anterior right insula and frontal operculum. No hemorrhage, hydrocephalus, or masslike finding. Vascular: See below Skull: Unremarkable Sinuses: Mild opacification of the left sphenoid sinus, incidental. Orbits: Negative Review of the MIP images confirms the above findings CTA NECK FINDINGS Aortic arch: Atheromatous calcification.  Two vessel branching. Right carotid system: Mild, especially for age, atheromatous plaque at the bifurcation without stenosis or ulceration. Left carotid system: Mild atheromatous plaque at the bifurcation. There is ICA tortuosity with kinking. No dissection or beading. Vertebral arteries: No proximal subclavian stenosis. The codominant vertebral arteries are widely patent. Small outpouching laterally from the V2 segment on the right. No generalized beading or dissection flap. Skeleton: Lower cervical disc degeneration and generalized degenerative facet spurring. No acute or aggressive finding. Other neck: Negative Upper chest: Mild emphysema. Review of the MIP images confirms the above findings CTA HEAD FINDINGS Anterior circulation: Atheromatous calcification on the carotid siphons. Right M2 branch occlusion/pre occlusion with faint  reconstitution and then subsequent reocclusion at the M3 level, correlating with the level of infarct. Regional M3/4 branch is appear asymmetrically irregular on MIPS. No contralateral embolism is noted. Negative for aneurysm Posterior circulation: The vertebral and basilar arteries are smooth and diffusely patent. Fetal type right PCA. No branch occlusion, beading, or aneurysm. Venous sinuses: Diffusely patent Anatomic variants: None significant Review of the MIP images confirms the above findings IMPRESSION: 1. Right M2/3 branch occlusion with completed insular and opercular infarction. Regional M3/4 branch is appear asymmetrically irregular, likely from showering of smaller emboli in this territory. 2. Mild for age atherosclerosis in the head and neck. Electronically Signed   By: Monte Fantasia M.D.   On: 08/14/2020 12:30   DG Chest 2 View  Result Date: 08/14/2020 CLINICAL DATA:  Stroke symptoms EXAM: CHEST - 2 VIEW COMPARISON:  03/29/2020 FINDINGS: Stable cardiomegaly. Atherosclerotic calcification of the aortic knob. No focal airspace consolidation, pleural effusion, or pneumothorax. Degenerative changes of the bilateral shoulders. IMPRESSION: Stable cardiomegaly. No acute cardiopulmonary findings. Electronically Signed   By: Davina Poke D.O.   On: 08/14/2020 13:45   CT Angio Neck W and/or Wo Contrast  Result Date: 08/14/2020 CLINICAL DATA:  Slurred speech. Recent GI bleeding and discharge. Atrial fibrillation EXAM: CT ANGIOGRAPHY HEAD AND NECK TECHNIQUE: Multidetector CT imaging of the head and neck was performed using the standard protocol during bolus administration of intravenous contrast. Multiplanar CT image reconstructions and MIPs were obtained to evaluate the vascular anatomy. Carotid stenosis measurements (when applicable) are obtained utilizing NASCET criteria, using the distal internal carotid diameter as the denominator. CONTRAST:  6mL OMNIPAQUE IOHEXOL 350 MG/ML SOLN COMPARISON:   Brain MRI 05/19/2017 FINDINGS: CT HEAD FINDINGS Brain: Moderate area of cytotoxic edema appearance involving the anterior right insula and frontal operculum. No hemorrhage, hydrocephalus, or masslike finding. Vascular: See below Skull: Unremarkable Sinuses: Mild opacification of the left sphenoid sinus, incidental. Orbits: Negative Review of the MIP images confirms the above findings CTA NECK FINDINGS Aortic arch: Atheromatous calcification.  Two vessel branching. Right carotid system: Mild, especially for age, atheromatous plaque at the bifurcation without stenosis or ulceration. Left carotid system: Mild atheromatous plaque at the bifurcation. There is ICA tortuosity with kinking. No dissection or beading. Vertebral arteries: No proximal subclavian stenosis. The codominant vertebral arteries are widely patent. Small outpouching laterally from the V2 segment on the right. No generalized beading or dissection flap. Skeleton:  Lower cervical disc degeneration and generalized degenerative facet spurring. No acute or aggressive finding. Other neck: Negative Upper chest: Mild emphysema. Review of the MIP images confirms the above findings CTA HEAD FINDINGS Anterior circulation: Atheromatous calcification on the carotid siphons. Right M2 branch occlusion/pre occlusion with faint reconstitution and then subsequent reocclusion at the M3 level, correlating with the level of infarct. Regional M3/4 branch is appear asymmetrically irregular on MIPS. No contralateral embolism is noted. Negative for aneurysm Posterior circulation: The vertebral and basilar arteries are smooth and diffusely patent. Fetal type right PCA. No branch occlusion, beading, or aneurysm. Venous sinuses: Diffusely patent Anatomic variants: None significant Review of the MIP images confirms the above findings IMPRESSION: 1. Right M2/3 branch occlusion with completed insular and opercular infarction. Regional M3/4 branch is appear asymmetrically irregular,  likely from showering of smaller emboli in this territory. 2. Mild for age atherosclerosis in the head and neck. Electronically Signed   By: Monte Fantasia M.D.   On: 08/14/2020 12:30   MR BRAIN WO CONTRAST  Result Date: 08/14/2020 CLINICAL DATA:  Stroke follow-up. EXAM: MRI HEAD WITHOUT CONTRAST TECHNIQUE: Multiplanar, multiecho pulse sequences of the brain and surrounding structures were obtained without intravenous contrast. COMPARISON:  05/19/2017 head MRI and 08/14/2020 head and neck CTA FINDINGS: Brain: There is a small to moderate-sized acute right MCA infarct involving the insula and frontal lobe (primarily the operculum). No intracranial hemorrhage, mass, midline shift, or extra-axial fluid collection is identified. Scattered small foci of T2 hyperintensity in the cerebral white matter bilaterally are similar to the prior MRI and are nonspecific but compatible with chronic small vessel ischemic disease, minimal for age. Mild cerebral atrophy is within normal limits for age. Vascular: Major intracranial vascular flow voids are preserved. Skull and upper cervical spine: Unremarkable bone marrow signal. Sinuses/Orbits: Bilateral cataract extraction. Trace left sphenoid sinus fluid. Small left mastoid effusion. Other: None. IMPRESSION: 1. Small to moderate-sized acute right MCA infarct. 2. Minimal chronic small vessel ischemic disease. Electronically Signed   By: Logan Bores M.D.   On: 08/14/2020 16:11   MR BRAIN WO CONTRAST  Final Result    DG Chest 2 View  Final Result    CT Angio Head W or Wo Contrast  Final Result    CT Angio Neck W and/or Wo Contrast  Final Result    DG Swallowing Func-Speech Pathology    (Results Pending)  NM GI Blood Loss    (Results Pending)    Scheduled Meds: .  stroke: mapping our early stages of recovery book   Does not apply Once  . atorvastatin  20 mg Oral Daily  . cholecalciferol  2,000 Units Oral Daily  . famotidine  20 mg Oral Daily  . fluticasone  furoate-vilanterol  1 puff Inhalation Daily  . hydrocortisone  25 mg Rectal BID  . latanoprost  1 drop Both Eyes QHS  . magnesium oxide  800 mg Oral Daily  . memantine  5 mg Oral BID  . pantoprazole  40 mg Oral Daily   PRN Meds: acetaminophen **OR** acetaminophen (TYLENOL) oral liquid 160 mg/5 mL **OR** acetaminophen, albuterol, diphenhydrAMINE, hydrALAZINE, LORazepam, senna-docusate Continuous Infusions: . sodium chloride       LOS: 1 day  Time spent: Greater than 50% of the 35 minute visit was spent in counseling/coordination of care for the patient as laid out in the A&P.   Dwyane Dee, MD [facility] 08/15/2020, 11:02 AM

## 2020-08-15 NOTE — Assessment & Plan Note (Addendum)
-  Unfortunately patient remains high risk for recurrent stroke; developed new CVA this hospitalization (was off Xarelto for approximately 2 to 3 days, 12/22-12/24 for LGIB) - MRI brain shows "Right M2/3 branch occlusion with completed insular and opercular infarction. Regional M3/4 branch is appear asymmetrically irregular, likely from showering of smaller emboli in this territory." - xarelto on hold; was on heparin on admission but now recurrent rectal bleeding (stopping heparin drip again) -Continue statin -Hgb now stable, no further bleeding x 2 days. Per neuro, will place patient on eliquis (of note she did have a stroke on Eliquis and was changed to Xarelto but has had the GIB issues on Xarelto) - start Eliquis 2.5 mg BID *(dose adjusted due to age and weight); monitor Hgb and for any further recurrent rectal bleeding (hopefully stable for d/c on Wed/Thus if no bleeding)

## 2020-08-15 NOTE — Evaluation (Signed)
Clinical/Bedside Swallow Evaluation Patient Details  Name: Amber Barnett MRN: [id number] Date of Birth: [birth year]  Today's Date: 08/15/2020 Time: SLP Start Time (ACUTE ONLY): 0858 SLP Stop Time (ACUTE ONLY): 0913 SLP Time Calculation (min) (ACUTE ONLY): 15 min  Past Medical History:  Past Medical History:  Diagnosis Date  . Atrial fibrillation ([place])   . DVT (deep venous thrombosis) ([place])   . Dysrhythmia   . History of left breast cancer   . Hypertension   . Stroke [facility])    Past Surgical History:  Past Surgical History:  Procedure Laterality Date  . COLONOSCOPY WITH PROPOFOL N/A 12/29/2019   Procedure: COLONOSCOPY WITH PROPOFOL;  Surgeon: Arta Silence, MD;  Location: [facility];  Service: Endoscopy;  Laterality: N/A;  . IR ANGIO INTRA EXTRACRAN SEL COM CAROTID INNOMINATE UNI R MOD SED  05/18/2017  . IR ANGIO VERTEBRAL SEL SUBCLAVIAN INNOMINATE UNI R MOD SED  05/18/2017  . IR PERCUTANEOUS ART THROMBECTOMY/INFUSION INTRACRANIAL INC DIAG ANGIO  05/18/2017  . IR RADIOLOGIST EVAL & MGMT  07/19/2017  . RADIOLOGY WITH ANESTHESIA N/A 05/18/2017   Procedure: RADIOLOGY WITH ANESTHESIA;  Surgeon: Luanne Bras, MD;  Location: [facility];  Service: Radiology;  Laterality: N/A;   HPI:  Amber Barnett is a 86 y.o. female with medical history significant of diverticulosis with recurrent lower GI bleed, chronic A. 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  Imaging revealed R MCA embolic infarct.   Assessment / Plan / Recommendation Clinical Impression  Signs of aspiration were present this morning during breakfast. In addition to left labial residue, she coughed immediately after orange juice x 2. Her voice was wet and dysphonic Left facial CN VII involvement with reduced ROM and sensation. Tongue deviates to left caused by CN XII impairment. Nectar  thick juice was consumed without s/s aspiration during remainder of meal. Liquid downgraded to nectar thick, continue regular texture, pills whole in puree and MBS tomorrow. SLP Visit Diagnosis: Dysphagia, unspecified (R13.10)    Aspiration Risk  Mild aspiration risk    Diet Recommendation Regular;Nectar-thick liquid   Liquid Administration via: Cup Medication Administration: Whole meds with puree Supervision: Patient able to self feed Compensations: Lingual sweep for clearance of pocketing Postural Changes: Seated upright at 90 degrees    Other  Recommendations Oral Care Recommendations: Oral care BID   Follow up Recommendations Inpatient Rehab      Frequency and Duration min 2x/week  2 weeks       Prognosis Prognosis for Safe Diet Advancement: Good      Swallow Study   General Date of Onset: 08/14/20 HPI: Amber Barnett is a 86 y.o. female with medical history significant of diverticulosis with recurrent lower GI bleed, chronic A. 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  Imaging revealed R MCA embolic infarct. Type of Study: Bedside Swallow Evaluation Previous Swallow Assessment:  (none) Diet Prior to this Study: Regular;Thin liquids Temperature Spikes Noted: No Respiratory Status: Room air History of Recent Intubation: No Behavior/Cognition: Alert;Cooperative;Pleasant mood Oral Cavity Assessment: Within Functional Limits Oral Care Completed by SLP: No Oral Cavity - Dentition: Other (Comment) (partials upper and lower) Vision: Functional for self-feeding Self-Feeding Abilities: Able to feed self Patient Positioning: Upright in chair Baseline Vocal Quality: Normal Volitional Cough: Strong Volitional Swallow: Able to elicit  Oral/Motor/Sensory Function Overall Oral Motor/Sensory Function: Mild impairment Facial ROM: Reduced  left;Suspected CN VII (facial) dysfunction Facial Symmetry: Abnormal symmetry left;Suspected CN VII (facial) dysfunction Facial Strength: Suspected CN VII (facial) dysfunction Lingual ROM: Suspected CN XII (hypoglossal) dysfunction;Reduced right Lingual Symmetry: Abnormal symmetry left;Suspected CN XII (hypoglossal) dysfunction Lingual Strength: Reduced   Ice Chips Ice chips: Not tested   Thin Liquid Thin Liquid: Impaired Presentation: Cup Pharyngeal  Phase Impairments: Cough - Immediate;Wet Vocal Quality;Throat Clearing - Delayed    Nectar Thick Nectar Thick Liquid: Impaired Presentation: Cup Oral phase functional implications:  (labial residue)   Honey Thick Honey Thick Liquid: Not tested   Puree Puree: Impaired Presentation: Self Fed;Spoon Oral Phase Functional Implications: Other (comment) (labial residue) Pharyngeal Phase Impairments:  (none)   Solid     Solid: Within functional limits      Amber Barnett 08/15/2020,9:35 AM  Amber Barnett Caroli.Ed Risk analyst [phone] Office [phone]

## 2020-08-15 NOTE — Assessment & Plan Note (Addendum)
-  Cardizem resumed -Metoprolol still on hold, resume if still needed

## 2020-08-15 NOTE — Progress Notes (Signed)
OT Cancellation Note  Patient Details Name: Amber Barnett MRN: [id number] DOB: Aug 04, 1934   Cancelled Treatment:    Reason Eval/Treat Not Completed: Patient at procedure or test/ unavailable.  Pt undergoing ECHO.  Will reattempt.  [name]., OTR/L Acute Rehabilitation Services Pager [phone] Office [phone]   [name] 08/15/2020, 3:15 PM

## 2020-08-15 NOTE — Hospital Course (Addendum)
[name] is an 86 yo female with PMH afib (on xarelto), HTN, diverticulosis, CVA (2018 s/p mech embolectomy and revasc), GIB, DVT who presented with severe dysarthria, left facial droop.  She was just hospitalized from 12/22 to 12/24 for hematochezia.  She underwent a bleeding scan which was negative.  Hemoglobin was monitored and remained stable; her Xarelto was held and resumed at discharge. Last colonoscopy was in May 2021 for rectal bleeding which showed internal hemorrhoids and diverticulosis with a negative bleeding scan at that time as well.  She underwent stroke work-up and was found to have a small to moderate sized acute right MCA infarct on MRI brain.  CTA head/neck showed right M2/3 branch occlusion with completed insular and opercular infarction.  A discussion was held on admission regarding bleed risk versus stroke risk.  Overall the patient and family elected to remain on anticoagulation and she was placed on a heparin drip.  The the night following admission and into the morning she developed 2 large bloody bowel movements with associated hypotension.  Her heparin drip was then discontinued and she underwent repeat bleeding scan which was also negative.  Hgb continued to downtrend and she was given 1 unit PRBC on 12/27.

## 2020-08-15 NOTE — Progress Notes (Addendum)
[place] for IV heparin Indication: atrial fibrillation and stroke  Allergies  Allergen Reactions  . Fish-Derived Products Swelling    Facial swelling then tongue swelling  . Lactuca Virosa Diarrhea  . Milk-Related Compounds Diarrhea    WHOLE MILK  . Other Swelling    Allergy to all nuts - facial swelling, then tongue swelling  . Amoxicillin Nausea And Vomiting    Patient Measurements: Height: 5\' 6"  (167.6 cm) Weight: 59 kg (130 lb) IBW/kg (Calculated) : 59.3 Heparin Dosing Weight: 59kg  Vital Signs: Temp: 98.1 F (36.7 C) (12/26 0418) Temp Source: Oral (12/26 0418) BP: 123/66 (12/26 0418) Pulse Rate: 68 (12/26 0418)  Labs: Recent Labs    08/13/20 0440 08/14/20 1100 08/14/20 1116 08/14/20 2214 08/15/20 0643  HGB 9.4* 10.4* 11.6*  --  9.4*  HCT 29.5* 34.1* 34.0*  --  29.0*  PLT 171 195  --   --  201  APTT  --  26  --   --   --   LABPROT  --  14.8  --   --   --   INR  --  1.2  --   --   --   HEPARINUNFRC  --   --   --  0.26* 0.53  CREATININE 1.09* 1.25* 1.20*  --   --     Estimated Creatinine Clearance: 32.5 mL/min (A) (by C-G formula based on SCr of 1.2 mg/dL (H)).   Assessment: 86yoF admitted with stroke-like symptoms after stopping Xarelto due to GI bleed. Pt was admitted on 12/22 for dark-colored stools felt to be diverticular bleed. Last dose of Xarelto was taken 12/21 with plans to resume 12/25. Of note, patient had a GI bleed in May 2021 where Xarelto was also held and pt developed bilateral popliteal artery embolisms requiring thrombectomies. Pharmacy has been consulted to dose IV heparin.  12/26 AM: Heparin level therapeutic (0.53) on gtt at 850 units/hr. No issues with line or bleeding reported per RN.  Goal of Therapy:  Heparin level 0.3-0.5 units/ml Monitor platelets by anticoagulation protocol: Yes   Plan:  Continue heparin infusion at 850 units/hr Will f/u 8 hr confirmatory heparin  level Addendum: Bloody stools reported by nurse this morning and heparin infusion discontinued.  Jocsan Mcginley P. Legrand Como, PharmD, [place] Please utilize Amion for appropriate phone number to reach the unit pharmacist ([place]) 08/15/2020 7:51 AM

## 2020-08-15 NOTE — Evaluation (Signed)
Physical Therapy Evaluation Patient Details Name: Amber Barnett Barnett MRN: [id number] DOB: [birth year] Today's Date: 08/15/2020   History of Present Illness  Amber Barnett is a 86 y.o. female with medical history significant of diverticulosis with recurrent lower GI bleed, chronic A. 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  Imaging revealed R MCA embolic infarct. Heparin re-started.    Clinical Impression  Pt admitted with above. Pt very emotionally labile today. Appears stroke symptoms have resolved. Pt eager to mobilize however pts HR increased to 150s during amb limiting ambulation distance. Pt denies feeling heart racing and denies lightheadedness. Pt did request to use bathroom. Pt with significant amount of bright red blood s/p BM, RN and MD made aware, PT did not flush toliet to it can be witness. Aware pt with recent GI bleed and now on heparin due to emoblic stroke. Pt very motivated to return home and reports she has lots of family that can help if needed. Pt functioning at min guard level at this time. Anticipate once bleeding and HR under control she will be able to return home with assist. Acute PT to cont to follow.    Follow Up Recommendations No PT follow up;Supervision/Assistance - 24 hour (24/7 initially)    Equipment Recommendations  None recommended by PT (has RW at home she may need to use initially)    Recommendations for Other Services       Precautions / Restrictions Precautions Precautions: Fall Precaution Comments: pt with GI bleed, lots of blood in toliet Restrictions Weight Bearing Restrictions: No      Mobility  Bed Mobility Overal bed mobility: Modified Independent             General bed mobility comments: HOB elevated, no use of bed rail, increased time but no physical assist required     Transfers Overall transfer level: Needs assistance Equipment used: None Transfers: Sit to/from Stand Sit to Stand: Min guard         General transfer comment: no difficulty, min guard for safety due to first time up  Ambulation/Gait Ambulation/Gait assistance: Min assist Gait Distance (Feet): 50 Feet Assistive device: 1 person hand held assist Gait Pattern/deviations: Step-through pattern;Decreased stride length (leaned into PT on the L) Gait velocity: dec Gait velocity interpretation: <1.31 ft/sec, indicative of household ambulator General Gait Details: pt denies feeling her heart racing or lightheadedness however HR inc to 150s, pt leaning into PT on the left for support  Stairs            Wheelchair Mobility    Modified Rankin (Stroke Patients Only)       Balance Overall balance assessment: Needs assistance Sitting-balance support: No upper extremity supported;Feet unsupported Sitting balance-Leahy Scale: Good Sitting balance - Comments: pt able to don socks EOB without difficulty or LOB   Standing balance support: No upper extremity supported Standing balance-Leahy Scale: Fair Standing balance comment: pt able to stand at sink and wash hands without LOB or leaning on sink for support                             Pertinent Vitals/Pain Pain Assessment: No/denies pain    Home Living Family/patient expects to be discharged to:: Private residence Living Arrangements: Alone Available Help at Discharge: Family;Friend(s);Available 24 hours/day Type of Home: Apartment (independent living, 5th floor) Home Access:  Elevator     Home Layout: One level Home Equipment: [place] - 2 wheels;Cane - single point;Shower seat      Prior Function Level of Independence: Independent         Comments: goes to [employer], does all self care, drives     Hand Dominance   Dominant Hand: Right    Extremity/Trunk Assessment   Upper Extremity  Assessment Upper Extremity Assessment: Generalized weakness    Lower Extremity Assessment Lower Extremity Assessment: Generalized weakness    Cervical / Trunk Assessment Cervical / Trunk Assessment: Normal  Communication   Communication: No difficulties (had expressive difficulties but seems to have resolved since being admitted)  Cognition Arousal/Alertness: Awake/alert Behavior During Therapy: WFL for tasks assessed/performed Overall Cognitive Status: Within Functional Limits for tasks assessed                                        General Comments General comments (skin integrity, edema, etc.): pts HR in 100s upon arrival, increased to 120-130s with movement, 140s-150s with ambulation limiting ambulation    Exercises     Assessment/Plan    PT Assessment Patient needs continued PT services  PT Problem List Decreased strength;Decreased activity tolerance;Decreased balance;Decreased mobility       PT Treatment Interventions DME instruction;Gait training;Stair training;Functional mobility training;Therapeutic activities;Therapeutic exercise;Balance training;Neuromuscular re-education    PT Goals (Current goals can be found in the Care Plan section)  Acute Rehab PT Goals Patient Stated Goal: home to her dog, Cream PT Goal Formulation: With patient Time For Goal Achievement: 08/29/20 Potential to Achieve Goals: Good    Frequency Min 4X/week   Barriers to discharge Decreased caregiver support lives in indep living but states she has neices and a brother who can stay with her    Co-evaluation               AM-PAC PT "6 Clicks" Mobility  Outcome Measure Help needed turning from your back to your side while in a flat bed without using bedrails?: None Help needed moving from lying on your back to sitting on the side of a flat bed without using bedrails?: None Help needed moving to and from a bed to a chair (including a wheelchair)?: A Little Help  needed standing up from a chair using your arms (e.g., wheelchair or bedside chair)?: A Little Help needed to walk in hospital room?: A Little Help needed climbing 3-5 steps with a railing? : A Little 6 Click Score: 20    End of Session Equipment Utilized During Treatment: Gait belt Activity Tolerance: Patient tolerated treatment well (limited by tachycardia) Patient left: in chair;with call bell/phone within reach;with nursing/sitter in room Nurse Communication: Mobility status (increased blood in toliet) PT Visit Diagnosis: Unsteadiness on feet (R26.81);Muscle weakness (generalized) (M62.81)    Time: 0829-0900 PT Time Calculation (min) (ACUTE ONLY): 31 min   Charges:   PT Evaluation $PT Eval Moderate Complexity: 1 Mod PT Treatments $Gait Training: 8-22 mins        Kittie Plater, PT, DPT Acute Rehabilitation Services Pager #: [phone] Office #: [phone]   Berline Lopes 08/15/2020, 9:11 AM

## 2020-08-15 NOTE — Progress Notes (Signed)
STROKE TEAM PROGRESS NOTE   HISTORY OF PRESENT ILLNESS (per record) Amber Barnett is an 86 y.o. female.  Chronic atrial fibrillation on long-term anticoagulation with Xarelto, hypertension, DVT, stroke presented sudden onset of slurred speech and facial droop initially noted yesterday evening.  Patient was speaking to a friend noticed it but did not seek medical help.  This morning when the daughter talked to the patient she had obvious slurred speech and facial droop.  She denies any extremity weakness numbness gait or balance problems.  Patient was recently admitted on 08/11/2020 with dark-colored stools which were felt to be diverticular bleed.  Xarelto was held and she was discharged yesterday with plans to resume Xarelto today which is on heparin so far.  She has undergone CT scan of the head noncontrast study in the ER which I personally reviewed showed low-density in the right insula consistent with an acute infarct she also underwent CT angiogram of brain and neck which shows Right M2 branch occlusion/pre occlusion with faint reconstitution and then subsequent reocclusion at the M3 level,correlating with the level of infarct. Regional M3/4 branch is appear asymmetrically irregular on MIPS. 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 fib treated with successful mechanical thrombectomy with complete recanalization and excellent clinical recovery.  She has a history of GI bleed in May 2021 Xarelto was held but subsequently she developed bilateral popliteal artery embolisms requiring bilateral popliteal thrombectomies by vascular surgery. She had done well since then on Xarelto until few days ago when she was readmitted for lower GI bleed and Xarelto was held Last seen normal at 7 PM 08/13/2020 IV TPA considered no as presenting outside time window and recent lower GI bleed Mechanical thrombectomy no as NIH  stroke scale is only 4 and right M2 branch occlusion may be too distal for thrombectomy   INTERVAL HISTORY I personally reviewed history of presenting illness, electronic medical records and imaging films in PACS.  Patient states she is doing a lot better her speech is improved significantly.  She is passed a swallow eval.  I discussed with her resuming Xarelto or alternatives like Eliquis and since she had GI hemorrhage on Xarelto she would prefer to switch to Eliquis.  She has no new complaints today MRI scan of the brain confirmed right insular and subfrontal cortical infarct which is nonhemorrhagic.   OBJECTIVE Vitals:   08/14/20 1819 08/14/20 2008 08/15/20 0009 08/15/20 0418  BP: 137/69 123/69 (!) 118/55 123/66  Pulse: 94 76 64 68  Resp: (!) 21 20 20  (!) 22  Temp: 98.2 F (36.8 C) 98.3 F (36.8 C) 98.1 F (36.7 C) 98.1 F (36.7 C)  TempSrc: Oral Oral Oral Oral  SpO2: 100% 99% 99% 100%  Weight:      Height:        CBC:  Recent Labs  Lab 08/11/20 0905 08/11/20 1921 08/13/20 0440 08/14/20 1100 08/14/20 1116  WBC 6.0  --  6.7 6.8  --   NEUTROABS 3.6  --   --  4.5  --   HGB 9.6*   < > 9.4* 10.4* 11.6*  HCT 30.7*   < > 29.5* 34.1* 34.0*  MCV 81.4  --  80.2 82.4  --   PLT 171  --  171 195  --    < > = values in this interval not displayed.    Basic Metabolic Panel:  Recent Labs  Lab  08/13/20 0440 08/14/20 1100 08/14/20 1116  NA 138 138 142  K 4.0 3.7 4.1  CL 106 107 109  CO2 23 22  --   GLUCOSE 95 93 90  BUN 22 14 17   CREATININE 1.09* 1.25* 1.20*  CALCIUM 8.7* 9.0  --     Lipid Panel:     Component Value Date/Time   CHOL 132 08/14/2020 1100   CHOL 168 06/09/2020 1327   TRIG 71 08/14/2020 1100   HDL 54 08/14/2020 1100   HDL 62 06/09/2020 1327   CHOLHDL 2.4 08/14/2020 1100   VLDL 14 08/14/2020 1100   LDLCALC 64 08/14/2020 1100   LDLCALC 92 06/09/2020 1327   HgbA1c:  Lab Results  Component Value Date   HGBA1C 5.3 08/14/2020   Urine Drug Screen:      Component Value Date/Time   LABOPIA NONE DETECTED 08/14/2020 1458   COCAINSCRNUR NONE DETECTED 08/14/2020 1458   LABBENZ POSITIVE (A) 08/14/2020 1458   AMPHETMU NONE DETECTED 08/14/2020 1458   THCU NONE DETECTED 08/14/2020 1458   LABBARB NONE DETECTED 08/14/2020 1458    Alcohol Level     Component Value Date/Time   ETH <10 08/14/2020 1100    IMAGING  CT Angio Head W or Wo Contrast CT Angio Neck W and/or Wo Contrast 08/14/2020 IMPRESSION:  1. Right M2/3 branch occlusion with completed insular and opercular infarction. Regional M3/4 branch is appear asymmetrically irregular, likely from showering of smaller emboli in this territory.  2. Mild for age atherosclerosis in the head and neck.   DG Chest 2 View 08/14/2020 IMPRESSION:  Stable cardiomegaly. No acute cardiopulmonary findings.  MR BRAIN WO CONTRAST 08/14/2020 IMPRESSION:  1. Small to moderate-sized acute right MCA infarct.  2. Minimal chronic small vessel ischemic disease.   Transthoracic Echocardiogram  00/00/2021 Pending  ECG - atrial fibrillation - ventricular response 85 BPM (See cardiology reading for complete details)   PHYSICAL EXAM Blood pressure 123/66, pulse 68, temperature 98.1 F (36.7 C), temperature source Oral, resp. rate (!) 22, height 5\' 6"  (1.676 m), weight 59 kg, SpO2 100 %. Pleasant elderly African-American lady not in distress. . Afebrile. Head is nontraumatic. Neck is supple without bruit.    Cardiac exam no murmur or gallop. Lungs are clear to auscultation. Distal pulses are well felt. Neurological Exam ;  Awake  Alert oriented x 3.  Mildly dysarthric speech but no aphasia.  Eye movements full without nystagmus.fundi were not visualized. Vision acuity and fields appear normal. Hearing is normal. Palatal movements are normal. Face asymmetric with left lower facial weakness.. Tongue midline. Normal strength, tone, reflexes and coordination. Normal sensation. Gait deferred.  NIHSS  2   ASSESSMENT/PLAN [name] is an 86 y.o. 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 fib treated with successful mechanical thrombectomy with complete recanalization and excellent clinical recovery) and history of GI bleed in May 2021 -> Xarelto was held but subsequently she developed bilateral popliteal artery embolisms requiring bilateral popliteal thrombectomies by vascular surgery  who presented 08/14/20 with sudden onset of slurred speech and facial droop initially noted the evening of 12/24.  She did not receive IV t-PA due to late presentation (>4.5 hours from time of onset). Not felt to be a candidate for thrombectomy due to  location of occlusion (distal right M2 branch) and low NIH of 4.  Stroke: Small to moderate-sized acute right MCA infarct - embolic - likely from atrial fibrillation.  Resultant dysarthria and left facial weakness  Code Stroke CT Head - not ordered  CT head - not ordered  MRI head - Small to moderate-sized acute right MCA infarct. Minimal chronic small vessel ischemic disease.  MRA head - not ordered  CTA H&N - Right M2/3 branch occlusion with completed insular and opercular infarction. Regional M3/4 branch is appear asymmetrically irregular, likely from showering of smaller emboli in this territory. Mild for age atherosclerosis in the head and neck.   CT Perfusion - not ordered  Carotid Doppler - CTA neck ordered - carotid dopplers not indicated.  2D Echo - pending  Lacey Jensen Virus 2 - negative  LDL - 64  HgbA1c - 5.3  UDS - benzodiazepine  VTE prophylaxis - Heparin IV Diet  Diet Order            Diet Heart Room service appropriate? Yes; Fluid consistency: Thin  Diet  effective now                 Secundino Ginger recently held for diverticular bleed prior to admission, now on heparin IV  Patient will be counseled to be compliant with her antithrombotic medications  Ongoing aggressive stroke risk factor management  Therapy recommendations:  pending  Disposition:  Pending  Hypertension  Home BP meds: diltiazem ; Exforge  Current BP meds: Hydralazine prn ; metoprolol prn  Stable . Permissive hypertension (OK if < 220/120) but gradually normalize in 5-7 days  . Long-term BP goal normotensive  Hyperlipidemia  Home Lipid lowering medication: Lipitor 20 mg daily  LDL 64, goal < 70  Current lipid lowering medication: Lipitor 20 mg daily  Continue statin at discharge  Other Stroke Risk Factors  Advanced age  Former cigarette smoker - quit  Family hx stroke (son)   Hx stroke/TIA  Atrial fibrillation  Other Active Problems, Findings and Recommendations Code status - Limited code   CKD - stage 3a  - creatinine - 1.20  Recent blood loss anemia - Hgb - 11.6  Hospital day # 1 I had a long discussion the patient with regards to available treatment options for atrial fibrillation for stroke prevention and given recent GI bleed this is an treatment paradox.  She is willing to go back on anticoagulation but prefers changing to Eliquis and she had a recent GI bleed on Xarelto.  Recommend DC IV heparin and start Eliquis 5 mg twice daily and pharmacy to dose.  Patient may be discharged home based therapy recommendations.  Follow-up as an outpatient stroke clinic in 6 weeks.  Greater than 50% time during this 25-minute visit was spent in counseling and coordination of care about embolic stroke and atrial fibrillation discussion about risk benefit of anticoagulation in this setting.  Discussed with Dr. Dwyane Dee Stroke team will sign off and call for questions Antony Contras, MD To contact Stroke Continuity provider, please refer to [email or web address] After  hours, contact General Neurology

## 2020-08-15 NOTE — Progress Notes (Signed)
Pt began to have increased slurred speech and reports stomach pain. Pt states "I feel like I need to poop" and reporting lightheadedness. HR and pulse began to drop and BP was reading low as well. Pt then began to have a bloody stool and vomit. Rapid Response was called. MD notified and new orders completed. Pt was cleaned and moved back to bed feeling better and hemodynamically stable. RN will continue to monitor

## 2020-08-16 ENCOUNTER — Encounter (HOSPITAL_COMMUNITY): Payer: Medicare HMO

## 2020-08-16 ENCOUNTER — Inpatient Hospital Stay (HOSPITAL_COMMUNITY): Payer: Medicare HMO

## 2020-08-16 DIAGNOSIS — I10 Essential (primary) hypertension: Secondary | ICD-10-CM | POA: Diagnosis not present

## 2020-08-16 LAB — BASIC METABOLIC PANEL
Anion gap: 8 (ref 5–15)
BUN: 14 mg/dL (ref 8–23)
CO2: 21 mmol/L — ABNORMAL LOW (ref 22–32)
Calcium: 8.3 mg/dL — ABNORMAL LOW (ref 8.9–10.3)
Chloride: 112 mmol/L — ABNORMAL HIGH (ref 98–111)
Creatinine, Ser: 1.13 mg/dL — ABNORMAL HIGH (ref 0.44–1.00)
GFR, Estimated: 48 mL/min — ABNORMAL LOW (ref >60)
Glucose, Bld: 90 mg/dL (ref 70–99)
Potassium: 3.8 mmol/L (ref 3.5–5.1)
Sodium: 141 mmol/L (ref 135–145)

## 2020-08-16 LAB — CBC
HCT: 24.4 % — ABNORMAL LOW (ref 36.0–46.0)
Hemoglobin: 7.8 g/dL — ABNORMAL LOW (ref 12.0–15.0)
MCH: 25.6 pg — ABNORMAL LOW (ref 26.0–34.0)
MCHC: 32 g/dL (ref 30.0–36.0)
MCV: 80 fL (ref 80.0–100.0)
Platelets: 195 10*3/uL (ref 150–400)
RBC: 3.05 MIL/uL — ABNORMAL LOW (ref 3.87–5.11)
RDW: 17.7 % — ABNORMAL HIGH (ref 11.5–15.5)
WBC: 6 10*3/uL (ref 4.0–10.5)
nRBC: 0 % (ref 0.0–0.2)

## 2020-08-16 LAB — HEMOGLOBIN AND HEMATOCRIT, BLOOD
HCT: 24.3 % — ABNORMAL LOW (ref 36.0–46.0)
HCT: 27.6 % — ABNORMAL LOW (ref 36.0–46.0)
HCT: 29.6 % — ABNORMAL LOW (ref 36.0–46.0)
Hemoglobin: 7.6 g/dL — ABNORMAL LOW (ref 12.0–15.0)
Hemoglobin: 8.8 g/dL — ABNORMAL LOW (ref 12.0–15.0)
Hemoglobin: 9.6 g/dL — ABNORMAL LOW (ref 12.0–15.0)

## 2020-08-16 LAB — MAGNESIUM: Magnesium: 1.9 mg/dL (ref 1.7–2.4)

## 2020-08-16 LAB — PREPARE RBC (CROSSMATCH)

## 2020-08-16 MED ORDER — SODIUM CHLORIDE 0.9% IV SOLUTION: Freq: Once | INTRAVENOUS | Status: DC

## 2020-08-16 NOTE — Progress Notes (Signed)
PROGRESS NOTE    Amber Barnett   [id number]  DOB: Sep 01, 1933  DOA: 08/14/2020     2  PCP: Egbert Garibaldi, PA-C  CC: Dysarthria and facial droop  Hospital Course: Mr. Shintaku is an 86 yo female with PMH afib (on xarelto), HTN, diverticulosis, CVA (2018 s/p mech embolectomy and revasc), GIB, DVT who presented with severe dysarthria, left facial droop.  She was just hospitalized from 12/22 to 12/24 for hematochezia.  She underwent a bleeding scan which was negative.  Hemoglobin was monitored and remained stable; her Xarelto was held and resumed at discharge. Last colonoscopy was in May 2021 for rectal bleeding which showed internal hemorrhoids and diverticulosis with a negative bleeding scan at that time as well.  She underwent stroke work-up and was found to have a small to moderate sized acute right MCA infarct on MRI brain.  CTA head/neck showed right M2/3 branch occlusion with completed insular and opercular infarction.  A discussion was held on admission regarding bleed risk versus stroke risk.  Overall the patient and family elected to remain on anticoagulation and she was placed on a heparin drip.  The the night following admission and into the morning she developed 2 large bloody bowel movements with associated hypotension.  Her heparin drip was then discontinued and she underwent repeat bleeding scan which was also negative.  Hgb continued to downtrend and she was given 1 unit PRBC on 12/27.    Interval History:  No further bleeding overnight since yesterday.  Patient amenable for blood transfusion today.  Understands we will continue to monitor hemoglobin further and make further decisions tomorrow if stable.  Old records reviewed in assessment of this patient  ROS: Constitutional: negative for chills and fevers, Respiratory: negative for cough, Cardiovascular: negative for chest pain and Gastrointestinal: positive for Bloody bowel movement  Assessment &  Plan: * CVA (cerebral vascular accident) ([place]) -Unfortunately patient remains high risk for recurrent stroke; developed new CVA this hospitalization (was off Xarelto for approximately 2 to 3 days, 12/22-12/24 for LGIB) - MRI brain shows "Right M2/3 branch occlusion with completed insular and opercular infarction. Regional M3/4 branch is appear asymmetrically irregular, likely from showering of smaller emboli in this territory." - xarelto on hold; was on heparin on admission but now recurrent rectal bleeding (stopping heparin drip again) -Continue statin -Further decisions to be made on 08/17/2020 regarding anticoagulation going forward  GIB (gastrointestinal bleeding) -History of diverticular bleed.  Just hospitalized from 12/22 to 12/24 for likely recurrent diverticular bleed, negative bleeding scan.  Discussion was held on admission regarding risks and benefits of ongoing anticoagulation.  Patient elected for continuing on anticoagulation given stroke however this morning she is developing worsening recurrent GI bleeding with hypotension; at this time we will discontinue her heparin drip -Check stat H/H: stable but downtrending -Stat bleeding scan ordered. Negative again for active bleed - Hgb trend: 11.6 g/dL on admission down to 7.8g/dL this am. Given underlying comorbidites and symptomatic, will transfuse 1 unit PRBC on 12/27  Permanent atrial fibrillation (HCC) -Cardizem and Xarelto on hold in setting of GI bleed and hypotension/bradycardia  Essential hypertension -Hold Cardizem and metoprolol in setting of hypotension   Antimicrobials: n/a  DVT prophylaxis: Heparin drip now discontinued Code Status: Intubation but no CPR Family Communication: none present this am Disposition Plan: Status is: Inpatient  Remains inpatient appropriate because:Hemodynamically unstable, Ongoing diagnostic testing needed not appropriate for outpatient work up, Unsafe d/c plan, IV treatments appropriate  due to intensity of illness  or inability to take PO and Inpatient level of care appropriate due to severity of illness   Dispo: The patient is from: Home              Anticipated d/c is to: Pending PT eval              Anticipated d/c date is: > 3 days              Patient currently is not medically stable to d/c.   Objective: Blood pressure (!) 128/94, pulse 97, temperature 97.8 F (36.6 C), temperature source Oral, resp. rate 17, height 5\' 6"  (1.676 m), weight 59 kg, SpO2 99 %.  Examination: General appearance: Mild ongoing dysarthria.  No distress but is tearful and upset due to recent bleeding Head: Normocephalic, without obvious abnormality, atraumatic Eyes: EOMI Lungs: clear to auscultation bilaterally Heart: regular rate and rhythm and S1, S2 normal Abdomen: normal findings: bowel sounds normal and soft, non-tender Extremities: No edema Skin: mobility and turgor normal Neurologic: Mild dysarthria.  No obvious aphasia.  Strength and sensation intact throughout  Consultants:   Neuro   Procedures:     Data Reviewed: I have personally reviewed following labs and imaging studies Results for orders placed or performed during the hospital encounter of 08/14/20 (from the past 24 hour(s))  Hemoglobin and hematocrit, blood     Status: Abnormal   Collection Time: 08/15/20  7:22 PM  Result Value Ref Range   Hemoglobin 7.9 (L) 12.0 - 15.0 g/dL   HCT 25.0 (L) 36.0 - 46.0 %  Hemoglobin and hematocrit, blood     Status: Abnormal   Collection Time: 08/16/20 12:00 AM  Result Value Ref Range   Hemoglobin 7.6 (L) 12.0 - 15.0 g/dL   HCT 24.3 (L) 36.0 - 46.0 %  CBC     Status: Abnormal   Collection Time: 08/16/20  7:34 AM  Result Value Ref Range   WBC 6.0 4.0 - 10.5 K/uL   RBC 3.05 (L) 3.87 - 5.11 MIL/uL   Hemoglobin 7.8 (L) 12.0 - 15.0 g/dL   HCT 24.4 (L) 36.0 - 46.0 %   MCV 80.0 80.0 - 100.0 fL   MCH 25.6 (L) 26.0 - 34.0 pg   MCHC 32.0 30.0 - 36.0 g/dL   RDW 17.7 (H) 11.5 -  15.5 %   Platelets 195 150 - 400 K/uL   nRBC 0.0 0.0 - 0.2 %  Basic metabolic panel     Status: Abnormal   Collection Time: 08/16/20  7:34 AM  Result Value Ref Range   Sodium 141 135 - 145 mmol/L   Potassium 3.8 3.5 - 5.1 mmol/L   Chloride 112 (H) 98 - 111 mmol/L   CO2 21 (L) 22 - 32 mmol/L   Glucose, Bld 90 70 - 99 mg/dL   BUN 14 8 - 23 mg/dL   Creatinine, Ser 1.13 (H) 0.44 - 1.00 mg/dL   Calcium 8.3 (L) 8.9 - 10.3 mg/dL   GFR, Estimated 48 (L) >60 mL/min   Anion gap 8 5 - 15  Magnesium     Status: None   Collection Time: 08/16/20  7:34 AM  Result Value Ref Range   Magnesium 1.9 1.7 - 2.4 mg/dL  Hemoglobin and hematocrit, blood     Status: Abnormal   Collection Time: 08/16/20 11:41 AM  Result Value Ref Range   Hemoglobin 8.8 (L) 12.0 - 15.0 g/dL   HCT 27.6 (L) 36.0 - 46.0 %    Recent  Results (from the past 240 hour(s))  Resp Panel by RT-PCR (Flu A&B, Covid) Nasopharyngeal Swab     Status: None   Collection Time: 08/11/20  9:44 AM   Specimen: Nasopharyngeal Swab; Nasopharyngeal(NP) swabs in vial transport medium  Result Value Ref Range Status   SARS Coronavirus 2 by RT PCR NEGATIVE NEGATIVE Final    Comment: (NOTE) SARS-CoV-2 target nucleic acids are NOT DETECTED.  The SARS-CoV-2 RNA is generally detectable in upper respiratory specimens during the acute phase of infection. The lowest concentration of SARS-CoV-2 viral copies this assay can detect is 138 copies/mL. A negative result does not preclude SARS-Cov-2 infection and should not be used as the sole basis for treatment or other patient management decisions. A negative result may occur with  improper specimen collection/handling, submission of specimen other than nasopharyngeal swab, presence of viral mutation(s) within the areas targeted by this assay, and inadequate number of viral copies(<138 copies/mL). A negative result must be combined with clinical observations, patient history, and  epidemiological information. The expected result is Negative.  Fact Sheet for Patients:  BloggerCourse.comhttps://www.fda.gov/media/152166/download  Fact Sheet for Healthcare Providers:  SeriousBroker.ithttps://www.fda.gov/media/152162/download  This test is no t yet approved or cleared by the [place] FDA and  has been authorized for detection and/or diagnosis of SARS-CoV-2 by FDA under an Emergency Use Authorization (EUA). This EUA will remain  in effect (meaning this test can be used) for the duration of the COVID-19 declaration under Section 564(b)(1) of the Act, 21 U.S.C.section 360bbb-3(b)(1), unless the authorization is terminated  or revoked sooner.       Influenza A by PCR NEGATIVE NEGATIVE Final   Influenza B by PCR NEGATIVE NEGATIVE Final    Comment: (NOTE) The Xpert Xpress SARS-CoV-2/FLU/RSV plus assay is intended as an aid in the diagnosis of influenza from Nasopharyngeal swab specimens and should not be used as a sole basis for treatment. Nasal washings and aspirates are unacceptable for Xpert Xpress SARS-CoV-2/FLU/RSV testing.  Fact Sheet for Patients: BloggerCourse.comhttps://www.fda.gov/media/152166/download  Fact Sheet for Healthcare Providers: SeriousBroker.ithttps://www.fda.gov/media/152162/download  This test is not yet approved or cleared by the [place] FDA and has been authorized for detection and/or diagnosis of SARS-CoV-2 by FDA under an Emergency Use Authorization (EUA). This EUA will remain in effect (meaning this test can be used) for the duration of the COVID-19 declaration under Section 564(b)(1) of the Act, 21 U.S.C. section 360bbb-3(b)(1), unless the authorization is terminated or revoked.  Performed at [facility], [address] [place], [place] [address]   Resp Panel by RT-PCR (Flu A&B, Covid) Nasopharyngeal Swab     Status: None   Collection Time: 08/14/20 12:35 PM   Specimen: Nasopharyngeal Swab; Nasopharyngeal(NP) swabs in vial transport medium  Result Value Ref  Range Status   SARS Coronavirus 2 by RT PCR NEGATIVE NEGATIVE Final    Comment: (NOTE) SARS-CoV-2 target nucleic acids are NOT DETECTED.  The SARS-CoV-2 RNA is generally detectable in upper respiratory specimens during the acute phase of infection. The lowest concentration of SARS-CoV-2 viral copies this assay can detect is 138 copies/mL. A negative result does not preclude SARS-Cov-2 infection and should not be used as the sole basis for treatment or other patient management decisions. A negative result may occur with  improper specimen collection/handling, submission of specimen other than nasopharyngeal swab, presence of viral mutation(s) within the areas targeted by this assay, and inadequate number of viral copies(<138 copies/mL). A negative result must be combined with clinical observations, patient history, and epidemiological information.  The expected result is Negative.  Fact Sheet for Patients:  EntrepreneurPulse.com.au  Fact Sheet for Healthcare Providers:  IncredibleEmployment.be  This test is no t yet approved or cleared by the [place] FDA and  has been authorized for detection and/or diagnosis of SARS-CoV-2 by FDA under an Emergency Use Authorization (EUA). This EUA will remain  in effect (meaning this test can be used) for the duration of the COVID-19 declaration under Section 564(b)(1) of the Act, 21 U.S.C.section 360bbb-3(b)(1), unless the authorization is terminated  or revoked sooner.       Influenza A by PCR NEGATIVE NEGATIVE Final   Influenza B by PCR NEGATIVE NEGATIVE Final    Comment: (NOTE) The Xpert Xpress SARS-CoV-2/FLU/RSV plus assay is intended as an aid in the diagnosis of influenza from Nasopharyngeal swab specimens and should not be used as a sole basis for treatment. Nasal washings and aspirates are unacceptable for Xpert Xpress SARS-CoV-2/FLU/RSV testing.  Fact Sheet for  Patients: EntrepreneurPulse.com.au  Fact Sheet for Healthcare Providers: IncredibleEmployment.be  This test is not yet approved or cleared by the [place] FDA and has been authorized for detection and/or diagnosis of SARS-CoV-2 by FDA under an Emergency Use Authorization (EUA). This EUA will remain in effect (meaning this test can be used) for the duration of the COVID-19 declaration under Section 564(b)(1) of the Act, 21 U.S.C. section 360bbb-3(b)(1), unless the authorization is terminated or revoked.  Performed at [facility], [place] [address] [place], [place] [address]      Radiology Studies: MR BRAIN WO CONTRAST  Result Date: 08/14/2020 CLINICAL DATA:  Stroke follow-up. EXAM: MRI HEAD WITHOUT CONTRAST TECHNIQUE: Multiplanar, multiecho pulse sequences of the brain and surrounding structures were obtained without intravenous contrast. COMPARISON:  05/19/2017 head MRI and 08/14/2020 head and neck CTA FINDINGS: Brain: There is a small to moderate-sized acute right MCA infarct involving the insula and frontal lobe (primarily the operculum). No intracranial hemorrhage, mass, midline shift, or extra-axial fluid collection is identified. Scattered small foci of T2 hyperintensity in the cerebral white matter bilaterally are similar to the prior MRI and are nonspecific but compatible with chronic small vessel ischemic disease, minimal for age. Mild cerebral atrophy is within normal limits for age. Vascular: Major intracranial vascular flow voids are preserved. Skull and upper cervical spine: Unremarkable bone marrow signal. Sinuses/Orbits: Bilateral cataract extraction. Trace left sphenoid sinus fluid. Small left mastoid effusion. Other: None. IMPRESSION: 1. Small to moderate-sized acute right MCA infarct. 2. Minimal chronic small vessel ischemic disease. Electronically Signed   By: Logan Bores M.D.   On: 08/14/2020 16:11   NM GI Blood Loss  Result  Date: 08/15/2020 CLINICAL DATA:  Lower gastrointestinal bleed. Diverticulosis. Hematochezia. EXAM: NUCLEAR MEDICINE GASTROINTESTINAL BLEEDING SCAN TECHNIQUE: Sequential abdominal images were obtained following intravenous administration of Tc-52m labeled red blood cells. RADIOPHARMACEUTICALS:  21.6 mCi Tc-39m pertechnetate in-vitro labeled red cells. COMPARISON:  Bleeding scan 08/12/2020 FINDINGS: No evidence of active bleeding into the GI tract over 2 hours of imaging. Normal blood pool activity within the vascular structures. Physiologic activity in the kidneys and bladder. IMPRESSION: No evidence of active gastrointestinal bleeding. Electronically Signed   By: Suzy Bouchard M.D.   On: 08/15/2020 19:22   DG Swallowing Func-Speech Pathology  Result Date: 08/16/2020 Objective Swallowing Evaluation: Type of Study: MBS-Modified Barium Swallow Study  Patient Details Name: Remini Narciso MRN: [id number] Date of Birth: Oct 22, 1933 Today's Date: 08/16/2020 Time: SLP Start Time (ACUTE ONLY): 1010 -SLP Stop Time (ACUTE ONLY): 1025  SLP Time Calculation (min) (ACUTE ONLY): 15 min Past Medical History: Past Medical History: Diagnosis Date  Atrial fibrillation ([place])   DVT (deep venous thrombosis) (HCC)   Dysrhythmia   History of left breast cancer   Hypertension   Stroke [facility])  Past Surgical History: Past Surgical History: Procedure Laterality Date  COLONOSCOPY WITH PROPOFOL N/A 12/29/2019  Procedure: COLONOSCOPY WITH PROPOFOL;  Surgeon: Arta Silence, MD;  Location: [facility];  Service: Endoscopy;  Laterality: N/A;  IR ANGIO INTRA EXTRACRAN SEL COM CAROTID INNOMINATE UNI R MOD SED  05/18/2017  IR ANGIO VERTEBRAL SEL SUBCLAVIAN INNOMINATE UNI R MOD SED  05/18/2017  IR PERCUTANEOUS ART THROMBECTOMY/INFUSION INTRACRANIAL INC DIAG ANGIO  05/18/2017  IR RADIOLOGIST EVAL & MGMT  07/19/2017  RADIOLOGY WITH ANESTHESIA N/A 05/18/2017  Procedure: RADIOLOGY WITH ANESTHESIA;  Surgeon: Luanne Bras, MD;   Location: [place];  Service: Radiology;  Laterality: N/A; HPI: Lizbet Noury is a 86 y.o. female with medical history significant of diverticulosis with recurrent lower GI bleed, chronic A. 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  Imaging revealed R MCA embolic infarct.  Subjective: pleasant, alert Assessment / Plan / Recommendation CHL IP CLINICAL IMPRESSIONS 08/16/2020 Clinical Impression --Patient presents with a mild oropharyngeal dysphagia without penetration or aspiration of any tested boluses (thin liquids, regular solids, barium tablet). Patient exhibited premature spillage of all boluses resulting in swallow initiation delay to level of vallecular sinus. With thin liquids, there were two instances of swallow initiation delay to pyriform sinus. Trace pyriform sinus residuals noted with thin liquids however these cleared with subsequent swallows. Patient does present with a prominent cricopharyngeal bar which did slow transit of puree and regular solid textures, but did not significantly impact patient's swallow function or safety with PO's.  SLP Visit Diagnosis Dysphagia, oropharyngeal phase (R13.12) Attention and concentration deficit following -- Frontal lobe and executive function deficit following -- Impact on safety and function No limitations   CHL IP TREATMENT RECOMMENDATION 08/16/2020 Treatment Recommendations Therapy as outlined in treatment plan below   Prognosis 08/16/2020 Prognosis for Safe Diet Advancement Good Barriers to Reach Goals -- Barriers/Prognosis Comment -- CHL IP DIET RECOMMENDATION 08/16/2020 SLP Diet Recommendations Thin liquid;Regular solids Liquid Administration via Cup;Straw Medication Administration Whole meds with liquid Compensations Slow rate;Small sips/bites;Follow solids with liquid Postural Changes Seated upright at 90 degrees   CHL IP  OTHER RECOMMENDATIONS 08/16/2020 Recommended Consults -- Oral Care Recommendations Oral care BID Other Recommendations --   CHL IP FOLLOW UP RECOMMENDATIONS 08/16/2020 Follow up Recommendations 24 hour supervision/assistance;None   CHL IP FREQUENCY AND DURATION 08/16/2020 Speech Therapy Frequency (ACUTE ONLY) min 1 x/week Treatment Duration --      CHL IP ORAL PHASE 08/16/2020 Oral Phase Impaired Oral - Pudding Teaspoon -- Oral - Pudding Cup -- Oral - Honey Teaspoon -- Oral - Honey Cup -- Oral - Nectar Teaspoon -- Oral - Nectar Cup -- Oral - Nectar Straw -- Oral - Thin Teaspoon -- Oral - Thin Cup Premature spillage Oral - Thin Straw Premature spillage Oral - Puree Premature spillage Oral - Mech Soft -- Oral - Regular Premature spillage Oral - Multi-Consistency -- Oral - Pill Premature spillage Oral Phase - Comment --  CHL IP PHARYNGEAL PHASE 08/16/2020 Pharyngeal Phase Impaired Pharyngeal- Pudding Teaspoon -- Pharyngeal -- Pharyngeal- Pudding Cup -- Pharyngeal -- Pharyngeal- Honey Teaspoon -- Pharyngeal -- Pharyngeal- Honey Cup -- Pharyngeal -- Pharyngeal- Nectar  Teaspoon -- Pharyngeal -- Pharyngeal- Nectar Cup -- Pharyngeal -- Pharyngeal- Nectar Straw -- Pharyngeal -- Pharyngeal- Thin Teaspoon -- Pharyngeal -- Pharyngeal- Thin Cup Delayed swallow initiation-vallecula;Delayed swallow initiation-pyriform sinuses;Pharyngeal residue - pyriform Pharyngeal -- Pharyngeal- Thin Straw Delayed swallow initiation-vallecula;Pharyngeal residue - pyriform Pharyngeal -- Pharyngeal- Puree Delayed swallow initiation-vallecula Pharyngeal -- Pharyngeal- Mechanical Soft -- Pharyngeal -- Pharyngeal- Regular Delayed swallow initiation-vallecula Pharyngeal -- Pharyngeal- Multi-consistency -- Pharyngeal -- Pharyngeal- Pill Delayed swallow initiation-vallecula Pharyngeal -- Pharyngeal Comment --  CHL IP CERVICAL ESOPHAGEAL PHASE 08/16/2020 Cervical Esophageal Phase Impaired Pudding Teaspoon -- Pudding Cup -- Honey Teaspoon -- Honey Cup --  Nectar Teaspoon -- Nectar Cup -- Nectar Straw -- Thin Teaspoon NT Thin Cup Prominent cricopharyngeal segment Thin Straw Prominent cricopharyngeal segment Puree Prominent cricopharyngeal segment Mechanical Soft NT Regular Prominent cricopharyngeal segment Multi-consistency NT Pill Prominent cricopharyngeal segment Cervical Esophageal Comment Prominent cp bar did result in some slowed bolus transit with purees and with barium tablet but not significanlty impacting swallow overall at this time. Sonia Baller, MA, CCC-SLP Speech Therapy [facility]             ECHOCARDIOGRAM COMPLETE  Result Date: 08/15/2020    ECHOCARDIOGRAM REPORT   Patient Name:   BREEYA FRIZZELL [facility] Date of Exam: 08/15/2020 Medical Rec #:  [id number]                 Height:       66.0 in Accession #:    [id number]                Weight:       130.0 lb Date of Birth:  [birth year]                BSA:          1.665 m Patient Age:    69 years                  BP:           123/66 mmHg Patient Gender: F                         HR:           68 bpm. Exam Location:  Inpatient Procedure: 2D Echo, Cardiac Doppler and Color Doppler Indications:    Stroke 434.91 / I163.9  History:        Patient has prior history of Echocardiogram examinations, most                 recent 05/20/2017. Stroke, Arrythmias:Atrial Fibrillation; Risk                 Factors:Hypertension. Current use of long term anticoagulation,                 History of left breast cancer (From Hx).  Sonographer:    Alvino Chapel RCS Referring Phys: [id number] [place]  1. Left ventricular ejection fraction, by estimation, is 60 to 65%. The left ventricle has normal function. The left ventricle has no regional wall motion abnormalities. There is severe concentric left ventricular hypertrophy. Left ventricular diastolic  function could not be evaluated. There is the interventricular septum is flattened in systole and diastole, consistent with right ventricular pressure  and volume overload.  2. Right ventricular systolic function is mildly reduced. The right ventricular size is normal. There is mildly elevated pulmonary artery systolic pressure. The estimated right ventricular systolic pressure  is 42.9 mmHg.  3. Left atrial size was moderately dilated.  4. Right atrial size was severely dilated.  5. A small pericardial effusion is present. The pericardial effusion is posterior to the left ventricle.  6. The mitral valve is normal in structure. Moderate mitral valve regurgitation. No evidence of mitral stenosis.  7. Tricuspid valve regurgitation is moderate to severe.  8. The aortic valve is normal in structure. Aortic valve regurgitation is mild. Mild to moderate aortic valve sclerosis/calcification is present, without any evidence of aortic stenosis.  9. The inferior vena cava is dilated in size with >50% respiratory variability, suggesting right atrial pressure of 8 mmHg. FINDINGS  Left Ventricle: Left ventricular ejection fraction, by estimation, is 60 to 65%. The left ventricle has normal function. The left ventricle has no regional wall motion abnormalities. The left ventricular internal cavity size was normal in size. There is  severe concentric left ventricular hypertrophy. The interventricular septum is flattened in systole and diastole, consistent with right ventricular pressure and volume overload. Left ventricular diastolic function could not be evaluated due to atrial fibrillation. Left ventricular diastolic function could not be evaluated. Right Ventricle: The right ventricular size is normal. No increase in right ventricular wall thickness. Right ventricular systolic function is mildly reduced. There is mildly elevated pulmonary artery systolic pressure. The tricuspid regurgitant velocity  is 3.16 m/s, and with an assumed right atrial pressure of 3 mmHg, the estimated right ventricular systolic pressure is 0000000 mmHg. Left Atrium: Left atrial size was moderately  dilated. Right Atrium: Right atrial size was severely dilated. Pericardium: A small pericardial effusion is present. The pericardial effusion is posterior to the left ventricle. Mitral Valve: The mitral valve is normal in structure. Moderate mitral valve regurgitation. No evidence of mitral valve stenosis. MV peak gradient, 7.8 mmHg. The mean mitral valve gradient is 2.0 mmHg. Tricuspid Valve: The tricuspid valve is normal in structure. Tricuspid valve regurgitation is moderate to severe. No evidence of tricuspid stenosis. Aortic Valve: The aortic valve is normal in structure. Aortic valve regurgitation is mild. Aortic regurgitation PHT measures 588 msec. Mild to moderate aortic valve sclerosis/calcification is present, without any evidence of aortic stenosis. Pulmonic Valve: The pulmonic valve was normal in structure. Pulmonic valve regurgitation is trivial. No evidence of pulmonic stenosis. Aorta: The aortic root is normal in size and structure. Venous: The inferior vena cava is dilated in size with greater than 50% respiratory variability, suggesting right atrial pressure of 8 mmHg. IAS/Shunts: No atrial level shunt detected by color flow Doppler.  LEFT VENTRICLE PLAX 2D LVIDd:         3.40 cm LVIDs:         1.70 cm LV PW:         1.20 cm LV IVS:        2.10 cm LVOT diam:     1.60 cm LV SV:         31 LV SV Index:   19 LVOT Area:     2.01 cm  RIGHT VENTRICLE RV S prime:     8.16 cm/s TAPSE (M-mode): 1.3 cm LEFT ATRIUM              Index       RIGHT ATRIUM           Index LA diam:        4.50 cm  2.70 cm/m  RA Area:     33.90 cm LA Vol (A2C):   115.0 ml 69.06 ml/m  RA Volume:   112.00 ml 67.26 ml/m LA Vol (A4C):   118.0 ml 70.86 ml/m LA Biplane Vol: 117.0 ml 70.26 ml/m  AORTIC VALVE LVOT Vmax:   81.70 cm/s LVOT Vmean:  55.500 cm/s LVOT VTI:    0.154 m AI PHT:      588 msec  AORTA Ao Root diam: 3.10 cm MITRAL VALVE                TRICUSPID VALVE MV Area (PHT): 5.13 cm     TR Peak grad:   39.9 mmHg MV Peak  grad:  7.8 mmHg     TR Vmax:        316.00 cm/s MV Mean grad:  2.0 mmHg MV Vmax:       1.40 m/s     SHUNTS MV Vmean:      52.6 cm/s    Systemic VTI:  0.15 m MV Decel Time: 148 msec     Systemic Diam: 1.60 cm MR Peak grad: 75.7 mmHg MR Mean grad: 52.0 mmHg MR Vmax:      435.00 cm/s MR Vmean:     337.0 cm/s MV E velocity: 123.00 cm/s Tobias Alexander MD Electronically signed by Tobias Alexander MD Signature Date/Time: 08/15/2020/4:11:21 PM    Final    DG Swallowing Func-Speech Pathology  Final Result    NM GI Blood Loss  Final Result    MR BRAIN WO CONTRAST  Final Result    DG Chest 2 View  Final Result    CT Angio Head W or Wo Contrast  Final Result    CT Angio Neck W and/or Wo Contrast  Final Result      Scheduled Meds:   stroke: mapping our early stages of recovery book   Does not apply Once   sodium chloride   Intravenous Once   atorvastatin  20 mg Oral Daily   cholecalciferol  2,000 Units Oral Daily   famotidine  20 mg Oral Daily   fluticasone furoate-vilanterol  1 puff Inhalation Daily   hydrocortisone  25 mg Rectal BID   latanoprost  1 drop Both Eyes QHS   magnesium oxide  800 mg Oral Daily   memantine  5 mg Oral BID   pantoprazole  40 mg Oral Daily   PRN Meds: acetaminophen **OR** acetaminophen (TYLENOL) oral liquid 160 mg/5 mL **OR** acetaminophen, albuterol, diphenhydrAMINE, hydrALAZINE, LORazepam, senna-docusate Continuous Infusions:  sodium chloride 500 mL (08/15/20 2122)     LOS: 2 days  Time spent: Greater than 50% of the 35 minute visit was spent in counseling/coordination of care for the patient as laid out in the A&P.   Lewie Chamber, MD [facility] 08/16/2020, 2:20 PM

## 2020-08-16 NOTE — Progress Notes (Signed)
Modified Barium Swallow Progress Note  Patient Details  Name: Amber Barnett MRN: [id number] Date of Birth: 25-Dec-1933  Today's Date: 08/16/2020  Modified Barium Swallow completed.  Full report located under Chart Review in the Imaging Section.  Brief recommendations include the following:  Clinical Impression  Patient presents with a mild oropharyngeal dysphagia without penetration or aspiration of any tested boluses (thin liquids, regular solids, barium tablet). Patient exhibited premature spillage of all boluses resulting in swallow initiation delay to level of vallecular sinus. With thin liquids, there were two instances of swallow initiation delay to pyriform sinus. Trace pyriform sinus residuals noted with thin liquids however these cleared with subsequent swallows. Patient does present with a prominent cricopharyngeal bar which did slow transit of puree and regular solid textures, but did not significantly impact patient's swallow function or safety with PO's.   Swallow Evaluation Recommendations       SLP Diet Recommendations: Thin liquid;Regular solids   Liquid Administration via: Cup;Straw   Medication Administration: Whole meds with liquid   Supervision: Patient able to self feed;Intermittent supervision to cue for compensatory strategies   Compensations: Slow rate;Small sips/bites;Follow solids with liquid   Postural Changes: Seated upright at 90 degrees   Oral Care Recommendations: Oral care BID      Sonia Baller, MA, CCC-SLP Speech Therapy [facility] Acute Rehab

## 2020-08-16 NOTE — Progress Notes (Signed)
Occupational Therapy Evaluation Patient Details Name: Amber Barnett MRN: [id number] DOB: [birth year] Today's Date: 08/16/2020    History of Present Illness Nicia Lavigna is a 86 y.o. female with medical history significant of diverticulosis with recurrent lower GI bleed, chronic A. 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  Imaging revealed R MCA embolic infarct. Heparin re-started.   Clinical Impression   Pt able to complete bathroom and sink transfer supervision level without RW this session. Pt with lunch arriving and needing help opening a few containers on the tray. Pt reports eagerness to d/c soon from acute care. OT to see one more session for higher balance challenges as pt will d/c home alone.     Follow Up Recommendations  Other (comment) (home safety evaluation)    Equipment Recommendations  None recommended by OT    Recommendations for Other Services       Precautions / Restrictions Precautions Precautions: Fall;Other (comment) Precaution Comments: pt with GI bleed, lots of blood in toliet, watch HR      Mobility Bed Mobility Overal bed mobility: Modified Independent                  Transfers Overall transfer level: Needs assistance   Transfers: Sit to/from Stand Sit to Stand: Supervision              Balance                                           ADL either performed or assessed with clinical judgement   ADL Overall ADL's : Needs assistance/impaired Eating/Feeding: Set up Eating/Feeding Details (indicate cue type and reason): unable to open a few containers and needed help Grooming: Wash/dry hands;Standing;Supervision/safety               Lower Body Dressing: Min guard;Sit to/from stand   Toilet Transfer: Copy Details (indicate cue type  and reason): needed cue to doff mesh panties and pt states "oh i forgot they put these on. i havent had any on for days" Toileting- Clothing Manipulation and Hygiene: Supervision/safety       Functional mobility during ADLs: Supervision/safety       Vision Baseline Vision/History: No visual deficits Patient Visual Report: No change from baseline       Perception     Praxis      Pertinent Vitals/Pain Pain Assessment: No/denies pain     Hand Dominance Right   Extremity/Trunk Assessment Upper Extremity Assessment Upper Extremity Assessment: Overall WFL for tasks assessed   Lower Extremity Assessment Lower Extremity Assessment: Defer to PT evaluation   Cervical / Trunk Assessment Cervical / Trunk Assessment: Normal   Communication Communication Communication: No difficulties   Cognition Arousal/Alertness: Awake/alert Behavior During Therapy: WFL for tasks assessed/performed Overall Cognitive Status: Within Functional Limits for tasks assessed                                     General Comments  no bleeding with bathroom trip this session    Exercises     Shoulder Instructions      Home Living Family/patient expects to be discharged to:: Private residence Living Arrangements: Alone Available Help at  Discharge: Family;Friend(s);Available 24 hours/day Type of Home: Apartment Home Access: Elevator     Home Layout: One level     Bathroom Shower/Tub: Producer, television/film/video: Standard     Home Equipment: Environmental consultant - 2 wheels;Cane - single point;Shower seat      Lives With: Friend(s)    Prior Functioning/Environment Level of Independence: Independent        Comments: goes to [employer], does all self care, drives        OT Problem List: Decreased strength;Decreased activity tolerance;Impaired balance (sitting and/or standing);Decreased cognition;Decreased safety awareness;Decreased knowledge of use of DME or AE;Decreased  knowledge of precautions      OT Treatment/Interventions: Self-care/ADL training;Therapeutic exercise;DME and/or AE instruction;Energy conservation;Therapeutic activities;Patient/family education;Balance training    OT Goals(Current goals can be found in the care plan section) Acute Rehab OT Goals Patient Stated Goal: home to her dog, Cream OT Goal Formulation: With patient Time For Goal Achievement: 08/31/20 Potential to Achieve Goals: Good  OT Frequency: Min 2X/week   Barriers to D/C: Decreased caregiver support          Co-evaluation              AM-PAC OT "6 Clicks" Daily Activity     Outcome Measure Help from another person eating meals?: None Help from another person taking care of personal grooming?: None Help from another person toileting, which includes using toliet, bedpan, or urinal?: None Help from another person bathing (including washing, rinsing, drying)?: A Little Help from another person to put on and taking off regular upper body clothing?: None Help from another person to put on and taking off regular lower body clothing?: None 6 Click Score: 23   End of Session Nurse Communication: Mobility status;Precautions  Activity Tolerance: Patient tolerated treatment well Patient left: in chair;with call bell/phone within reach;Other (comment) (tech made aware up in chair for lunch)  OT Visit Diagnosis: Unsteadiness on feet (R26.81);Muscle weakness (generalized) (M62.81)                Time: 3825-0539 OT Time Calculation (min): 13 min Charges:  OT General Charges $OT Visit: 1 Visit OT Evaluation $OT Eval Low Complexity: 1 Low   Brynn, OTR/L  Acute Rehabilitation Services Pager: [phone] Office: [phone] .   Mateo Flow 08/16/2020, 2:13 PM

## 2020-08-16 NOTE — Progress Notes (Signed)
Physical Therapy Treatment Patient Details Name: Amber Barnett MRN: [id number] DOB: Feb 07, 1934 Today's Date: 08/16/2020    History of Present Illness Amber Barnett is a 85 y.o. female with medical history significant of diverticulosis with recurrent lower GI bleed, chronic A. 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  Imaging revealed R MCA embolic infarct. Heparin re-started.    PT Comments    Patient progressing well towards PT goals. Reports feeling good today. Improved ambulation distance with Min guard assist for balance/safety. Initially with very slow gait speed and veering towards left but this improved with increased distance and cues to increase speed.  HR up to 152 bpm max with activity; asymptomatic. Pt with blood in stool again, RN notified as it was a good amount. Eager to return home to dog. Will follow.   Follow Up Recommendations  No PT follow up;Supervision/Assistance - 24 hour (24/7 initially)     Equipment Recommendations  None recommended by PT    Recommendations for Other Services       Precautions / Restrictions Precautions Precautions: Fall;Other (comment) Precaution Comments: pt with GI bleed, lots of blood in toliet, watch HR Restrictions Weight Bearing Restrictions: No    Mobility  Bed Mobility Overal bed mobility: Modified Independent             General bed mobility comments: HOB elevated, no use of bed rail, increased time but no physical assist required  Transfers Overall transfer level: Needs assistance Equipment used: None Transfers: Sit to/from Stand Sit to Stand: Min guard         General transfer comment: no difficulty, min guard for safety, stood from EOB x1, from toilet x1. Transferred to chair post ambulation.  Ambulation/Gait Ambulation/Gait assistance: Min guard Gait Distance  (Feet): 200 Feet Assistive device: None Gait Pattern/deviations: Step-through pattern;Decreased stride length Gait velocity: 1.49 ft/sec Gait velocity interpretation: 1.31 - 2.62 ft/sec, indicative of limited community ambulator General Gait Details: Slow, mildly unsteady gait veering towards left; very slow at first with cues to increase speed. HR up to 152 bpm, asymptomtaic.   Stairs             Wheelchair Mobility    Modified Rankin (Stroke Patients Only) Modified Rankin (Stroke Patients Only) Pre-Morbid Rankin Score: No significant disability Modified Rankin: Moderately severe disability     Balance Overall balance assessment: Needs assistance Sitting-balance support: Feet supported;No upper extremity supported Sitting balance-Leahy Scale: Good Sitting balance - Comments: pt able to don socks EOB without difficulty or LOB   Standing balance support: During functional activity Standing balance-Leahy Scale: Fair                              Cognition Arousal/Alertness: Awake/alert Behavior During Therapy: WFL for tasks assessed/performed Overall Cognitive Status: Within Functional Limits for tasks assessed                                        Exercises      General Comments General comments (skin integrity, edema, etc.): HR up to 152 bpm max during session. Bright red Blood in stool. RN notified and left in toilet to observe.      Pertinent Vitals/Pain Pain Assessment: No/denies pain    Home Living  Prior Function            PT Goals (current goals can now be found in the care plan section) Progress towards PT goals: Progressing toward goals    Frequency    Min 4X/week      PT Plan Current plan remains appropriate    Co-evaluation              AM-PAC PT "6 Clicks" Mobility   Outcome Measure  Help needed turning from your back to your side while in a flat bed without using  bedrails?: None Help needed moving from lying on your back to sitting on the side of a flat bed without using bedrails?: None Help needed moving to and from a bed to a chair (including a wheelchair)?: A Little Help needed standing up from a chair using your arms (e.g., wheelchair or bedside chair)?: A Little Help needed to walk in hospital room?: A Little Help needed climbing 3-5 steps with a railing? : A Little 6 Click Score: 20    End of Session Equipment Utilized During Treatment: Gait belt Activity Tolerance: Patient tolerated treatment well (limited by tachycardia) Patient left: in chair;with call bell/phone within reach Nurse Communication: Mobility status;Other (comment) (blood in stool and HR) PT Visit Diagnosis: Unsteadiness on feet (R26.81);Muscle weakness (generalized) (M62.81)     Time: 4599-7741 PT Time Calculation (min) (ACUTE ONLY): 20 min  Charges:  $Gait Training: 8-22 mins                     Vale Haven, PT, DPT Acute Rehabilitation Services Pager [phone] Office [phone]       Blake Divine A Lanier Ensign 08/16/2020, 8:14 AM

## 2020-08-16 NOTE — Evaluation (Signed)
Speech Language Pathology Evaluation Patient Details Name: Amber Barnett MRN: [id number] DOB: [birth year] Today's Date: 08/16/2020 Time: 1040-1105 SLP Time Calculation (min) (ACUTE ONLY): 25 min  Problem List:  Patient Active Problem List   Diagnosis Date Noted  . CVA (cerebral vascular accident) ([place]) 08/14/2020  . GIB (gastrointestinal bleeding) 08/11/2020  . Rectal bleeding 12/25/2019  . Current use of long term anticoagulation 05/21/2017  . Cerebral embolism with cerebral infarction ([place]) 05/18/2017  . Essential hypertension 05/08/2017  . Permanent atrial fibrillation ([place]) 05/08/2017   Past Medical History:  Past Medical History:  Diagnosis Date  . Atrial fibrillation ([place])   . DVT (deep venous thrombosis) ([place])   . Dysrhythmia   . History of left breast cancer   . Hypertension   . Stroke [facility])    Past Surgical History:  Past Surgical History:  Procedure Laterality Date  . COLONOSCOPY WITH PROPOFOL N/A 12/29/2019   Procedure: COLONOSCOPY WITH PROPOFOL;  Surgeon: Arta Silence, MD;  Location: [facility] ENDOSCOPY;  Service: Endoscopy;  Laterality: N/A;  . IR ANGIO INTRA EXTRACRAN SEL COM CAROTID INNOMINATE UNI R MOD SED  05/18/2017  . IR ANGIO VERTEBRAL SEL SUBCLAVIAN INNOMINATE UNI R MOD SED  05/18/2017  . IR PERCUTANEOUS ART THROMBECTOMY/INFUSION INTRACRANIAL INC DIAG ANGIO  05/18/2017  . IR RADIOLOGIST EVAL & MGMT  07/19/2017  . RADIOLOGY WITH ANESTHESIA N/A 05/18/2017   Procedure: RADIOLOGY WITH ANESTHESIA;  Surgeon: Luanne Bras, MD;  Location: [place];  Service: Radiology;  Laterality: N/A;   HPI:  Amber Barnett is a 86 y.o. female with medical history significant of diverticulosis with recurrent lower GI bleed, chronic A. 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  Imaging revealed R MCA embolic infarct.   Assessment / Plan /  Recommendation Clinical Impression  Patient presents with mild oral-motor impairment but without significant impact on speech intelligibility which was approximately 95-100% at conversational level.  Cognitive and linguistic abilities were all [facility] and patient with appropriate reasoning, safety awareness. She reported that she has been living at an ILF for the past few weeks and that her niece who is retired, plans to stay with her for short term after this hospital discharge. No follow up warranted for patient as far as her speech-language and cognitive functioning.    SLP Assessment  SLP Recommendation/Assessment: Patient does not need any further Speech Lanaguage Pathology Services SLP Visit Diagnosis: Cognitive communication deficit (R41.841)    Follow Up Recommendations  None    Frequency and Duration min 1 x/week         SLP Evaluation Cognition  Overall Cognitive Status: Within Functional Limits for tasks assessed Arousal/Alertness: Awake/alert Orientation Level: Oriented X4       Comprehension  Auditory Comprehension Overall Auditory Comprehension: Appears within functional limits for tasks assessed    Expression Expression Primary Mode of Expression: Verbal Verbal Expression Overall Verbal Expression: Appears within functional limits for tasks assessed   Oral / Motor  Oral Motor/Sensory Function Overall Oral Motor/Sensory Function: Mild impairment Facial ROM: Reduced left;Suspected CN VII (facial) dysfunction Facial Symmetry: Abnormal symmetry left;Suspected CN VII (facial) dysfunction Facial Strength: Suspected CN VII (facial) dysfunction Lingual ROM: Suspected CN XII (hypoglossal) dysfunction;Reduced right Lingual Symmetry: Abnormal symmetry left;Suspected CN XII (hypoglossal) dysfunction Lingual Strength: Reduced   GO                    Devario Bucklew  Earline Mayotte, MA, [place] Acute Rehab

## 2020-08-17 DIAGNOSIS — I4821 Permanent atrial fibrillation: Secondary | ICD-10-CM | POA: Diagnosis not present

## 2020-08-17 DIAGNOSIS — K922 Gastrointestinal hemorrhage, unspecified: Secondary | ICD-10-CM | POA: Diagnosis not present

## 2020-08-17 LAB — BPAM RBC
Blood Product Expiration Date: 202201142359
ISSUE DATE / TIME: 202112271621
Unit Type and Rh: 7300

## 2020-08-17 LAB — BASIC METABOLIC PANEL
Anion gap: 7 (ref 5–15)
BUN: 9 mg/dL (ref 8–23)
CO2: 21 mmol/L — ABNORMAL LOW (ref 22–32)
Calcium: 8.2 mg/dL — ABNORMAL LOW (ref 8.9–10.3)
Chloride: 113 mmol/L — ABNORMAL HIGH (ref 98–111)
Creatinine, Ser: 1.06 mg/dL — ABNORMAL HIGH (ref 0.44–1.00)
GFR, Estimated: 52 mL/min — ABNORMAL LOW (ref >60)
Glucose, Bld: 96 mg/dL (ref 70–99)
Potassium: 3.7 mmol/L (ref 3.5–5.1)
Sodium: 141 mmol/L (ref 135–145)

## 2020-08-17 LAB — CBC
HCT: 28.5 % — ABNORMAL LOW (ref 36.0–46.0)
Hemoglobin: 9.6 g/dL — ABNORMAL LOW (ref 12.0–15.0)
MCH: 27 pg (ref 26.0–34.0)
MCHC: 33.7 g/dL (ref 30.0–36.0)
MCV: 80.3 fL (ref 80.0–100.0)
Platelets: 190 10*3/uL (ref 150–400)
RBC: 3.55 MIL/uL — ABNORMAL LOW (ref 3.87–5.11)
RDW: 16.8 % — ABNORMAL HIGH (ref 11.5–15.5)
WBC: 6.5 10*3/uL (ref 4.0–10.5)
nRBC: 0 % (ref 0.0–0.2)

## 2020-08-17 LAB — TYPE AND SCREEN
ABO/RH(D): B POS
Antibody Screen: NEGATIVE
Unit division: 0

## 2020-08-17 LAB — HEMOGLOBIN AND HEMATOCRIT, BLOOD
HCT: 30 % — ABNORMAL LOW (ref 36.0–46.0)
Hemoglobin: 9.9 g/dL — ABNORMAL LOW (ref 12.0–15.0)

## 2020-08-17 LAB — MAGNESIUM: Magnesium: 1.7 mg/dL (ref 1.7–2.4)

## 2020-08-17 MED ORDER — APIXABAN 2.5 MG PO TABS
2.5000 mg | ORAL_TABLET | Freq: Two times a day (BID) | ORAL | Status: DC
  Administered 2020-08-17 – 2020-08-20 (×6): 2.5 mg via ORAL
  Filled 2020-08-17 (×7): qty 1

## 2020-08-17 MED ORDER — DILTIAZEM HCL ER COATED BEADS 120 MG PO CP24
120.0000 mg | ORAL_CAPSULE | Freq: Every day | ORAL | Status: DC
  Administered 2020-08-17 – 2020-08-23 (×8): 120 mg via ORAL
  Filled 2020-08-17 (×8): qty 1

## 2020-08-17 NOTE — Progress Notes (Addendum)
Occupational Therapy Treatment Patient Details Name: Amber Barnett MRN: [id number] DOB: 11/28/1933 Today's Date: 08/17/2020    History of present illness Amber Barnett is a 86 y.o. female with medical history significant of diverticulosis with recurrent lower GI bleed, chronic A. 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  Imaging revealed R MCA embolic infarct. Heparin re-started.   OT comments  Pt making great progress towards OT goals this session. Session focus on dynamic balance challenges in relation to IADL tasks. Pt able to complete household distance functional mobility with no AD with gross supervision to gather ADL items on floor in order to simulate IADL tasks of cleaning and taking care of her dog "cream". Pt completed task with no reports of dizziness or LOB. Pt able to stand at sink to fold towels as simulated laundry task,however pt with increase in HR to 153 bpm with tachycardia resolving with seated rest break. Pt currently requires supervision for standing UB ADLs and supervision for LB ADLs from EOB. Agree with DC plan, will follow acutely per POC.    Follow Up Recommendations  Other (comment) (home safety evaluation)    Equipment Recommendations  None recommended by OT    Recommendations for Other Services      Precautions / Restrictions Precautions Precautions: Fall;Other (comment) Precaution Comments: watch HR; GI bleed Restrictions Weight Bearing Restrictions: No       Mobility Bed Mobility Overal bed mobility: Modified Independent             General bed mobility comments: HOB elevated, no use of bed rail, increased time but no physical assist required  Transfers Overall transfer level: Needs assistance Equipment used: None Transfers: Sit to/from Stand Sit to Stand: Supervision         General  transfer comment: gross supervision for safety, good hand placement noted with pt completing multiple sit<>stands from recliner and EOB with no difficulty noted    Balance Overall balance assessment: Needs assistance Sitting-balance support: Feet supported;No upper extremity supported Sitting balance-Leahy Scale: Good     Standing balance support: During functional activity Standing balance-Leahy Scale: Fair Standing balance comment: pt able to stand at sink and complete UB ADLS without LOB or leaning on sink for support, pt also able to reach out of BOS to gather ADL items on floor with no AD or LOB                           ADL either performed or assessed with clinical judgement   ADL Overall ADL's : Needs assistance/impaired     Grooming: Oral care;Standing;Wash/dry face;Supervision/safety;Sitting Grooming Details (indicate cue type and reason): pt tachy during standing ADLs, deferred standing to sitting d/t increase in HR             Lower Body Dressing: Supervision/safety;Sitting/lateral leans Lower Body Dressing Details (indicate cue type and reason): to adjust socks from EOB Toilet Transfer: Supervision/safety;Ambulation Toilet Transfer Details (indicate cue type and reason): gross supervision for safety during simulated toilet transfer via functional mobility       Tub/Shower Transfer Details (indicate cue type and reason): pt reports walkin shower at home with seat if needed Functional mobility during ADLs: Supervision/safety General ADL Comments: session focus on dynamic  balacne challenges in relation to IADL tasks, pt able to gather ADL items from floor around room with no  LOB. pt completed standing grooming tasks with supervision and no LOB, HR increase to 152 bpm with standing IADL tasks, deferred task to sitting     Vision Baseline Vision/History: No visual deficits Patient Visual Report: No change from baseline     Perception     Praxis       Cognition Arousal/Alertness: Awake/alert Behavior During Therapy: WFL for tasks assessed/performed Overall Cognitive Status: Within Functional Limits for tasks assessed                                          Exercises     Shoulder Instructions       General Comments BP from supine post mobility- 165/150- RN aware, pt reprots she goes to [employer] for all meals but will occassionally make soup in her room, her nieces and nephews get her groceries as needed, pt liked to decorate her room and take her dog "cream" for a walk. pt reports she sometimes will hand wash her delicates but mostly has the facility d/t her laundry    Pertinent Vitals/ Pain       Pain Assessment: No/denies pain  Home Living                                          Prior Functioning/Environment              Frequency  Min 2X/week        Progress Toward Goals  OT Goals(current goals can now be found in the care plan section)  Progress towards OT goals: Progressing toward goals  Acute Rehab OT Goals Patient Stated Goal: home to her dog, Cream OT Goal Formulation: With patient Time For Goal Achievement: 08/31/20 Potential to Achieve Goals: Good  Plan Discharge plan remains appropriate;Frequency remains appropriate    Co-evaluation                 AM-PAC OT "6 Clicks" Daily Activity     Outcome Measure   Help from another person eating meals?: None Help from another person taking care of personal grooming?: None Help from another person toileting, which includes using toliet, bedpan, or urinal?: None Help from another person bathing (including washing, rinsing, drying)?: A Little Help from another person to put on and taking off regular upper body clothing?: None Help from another person to put on and taking off regular lower body clothing?: None 6 Click Score: 23    End of Session Equipment Utilized During Treatment: Gait belt  OT Visit  Diagnosis: Unsteadiness on feet (R26.81);Muscle weakness (generalized) (M62.81)   Activity Tolerance Patient tolerated treatment well   Patient Left in bed;with call bell/phone within reach;with bed alarm set   Nurse Communication Mobility status;Other (comment) (increase HR and hypertension post mobility)        Time: 1696-7893 OT Time Calculation (min): 27 min  Charges: OT General Charges $OT Visit: 1 Visit OT Treatments $Self Care/Home Management : 23-37 mins  Lenor Derrick., COTA/L Acute Rehabilitation Services [phone]    Barron Schmid 08/17/2020, 8:36 AM

## 2020-08-17 NOTE — Progress Notes (Signed)
PROGRESS NOTE    Amber Barnett   [id number]  DOB: 1933/10/22  DOA: 08/14/2020     3  PCP: Egbert Garibaldi, PA-C  CC: Dysarthria and facial droop  Hospital Course: Mr. Holihan is an 86 yo female with PMH afib (on xarelto), HTN, diverticulosis, CVA (2018 s/p mech embolectomy and revasc), GIB, DVT who presented with severe dysarthria, left facial droop.  She was just hospitalized from 12/22 to 12/24 for hematochezia.  She underwent a bleeding scan which was negative.  Hemoglobin was monitored and remained stable; her Xarelto was held and resumed at discharge. Last colonoscopy was in May 2021 for rectal bleeding which showed internal hemorrhoids and diverticulosis with a negative bleeding scan at that time as well.  She underwent stroke work-up and was found to have a small to moderate sized acute right MCA infarct on MRI brain.  CTA head/neck showed right M2/3 branch occlusion with completed insular and opercular infarction.  A discussion was held on admission regarding bleed risk versus stroke risk.  Overall the patient and family elected to remain on anticoagulation and she was placed on a heparin drip.  The the night following admission and into the morning she developed 2 large bloody bowel movements with associated hypotension.  Her heparin drip was then discontinued and she underwent repeat bleeding scan which was also negative.  Hgb continued to downtrend and she was given 1 unit PRBC on 12/27.    Interval History:  Continues to have no further bleeding.  Feels well and has improved significantly.  Speech back to normal.  Brother bedside this morning as well.  She is amenable for restarting on anticoagulation and monitoring response.  Old records reviewed in assessment of this patient  ROS: Constitutional: negative for chills and fevers, Respiratory: negative for cough, Cardiovascular: negative for chest pain and Gastrointestinal: positive for Bloody bowel  movement  Assessment & Plan: * CVA (cerebral vascular accident) ([place]) -Unfortunately patient remains high risk for recurrent stroke; developed new CVA this hospitalization (was off Xarelto for approximately 2 to 3 days, 12/22-12/24 for LGIB) - MRI brain shows "Right M2/3 branch occlusion with completed insular and opercular infarction. Regional M3/4 branch is appear asymmetrically irregular, likely from showering of smaller emboli in this territory." - xarelto on hold; was on heparin on admission but now recurrent rectal bleeding (stopping heparin drip again) -Continue statin -Hgb now stable, no further bleeding x 2 days. Per neuro, will place patient on eliquis (of note she did have a stroke on Eliquis and was changed to Xarelto but has had the GIB issues on Xarelto) - start Eliquis 2.5 mg BID *(dose adjusted due to age and weight); monitor Hgb and for any further recurrent rectal bleeding (hopefully stable for d/c on Wed/Thus if no bleeding)  GIB (gastrointestinal bleeding) -History of diverticular bleed.  Just hospitalized from 12/22 to 12/24 for likely recurrent diverticular bleed, negative bleeding scan.  Discussion was held on admission regarding risks and benefits of ongoing anticoagulation.  Patient elected for continuing on anticoagulation given stroke however this morning she is developing worsening recurrent GI bleeding with hypotension; at this time we will discontinue her heparin drip -Check stat H/H: stable but downtrending -Stat bleeding scan ordered. Negative again for active bleed - Hgb trend: 11.6 g/dL on admission down to 7.8g/dL on morning of 12/27. Given underlying comorbidites and symptomatic, will transfuse 1 unit PRBC on 12/27 - Hgb up to 9.6 g/dL this am and no further bleeding off anticoagulation  Permanent atrial  fibrillation ([place]) -Cardizem and Xarelto on hold in setting of GI bleed and hypotension/bradycardia - resumed cardizem on 12/28 - now on eliquis 2.5 mg BID  due to age and weight  Essential hypertension -Cardizem resumed -Metoprolol still on hold, resume if still needed   Antimicrobials: n/a  DVT prophylaxis: Starting Eliquis back on 08/17/2020 Code Status: Intubation but no CPR Family Communication: Brother bedside Disposition Plan: Status is: Inpatient  Remains inpatient appropriate because:Hemodynamically unstable, Ongoing diagnostic testing needed not appropriate for outpatient work up, Unsafe d/c plan, IV treatments appropriate due to intensity of illness or inability to take PO and Inpatient level of care appropriate due to severity of illness   Dispo: The patient is from: Home              Anticipated d/c is to: Pending PT eval              Anticipated d/c date is: > 3 days              Patient currently is not medically stable to d/c.   Objective: Blood pressure (!) 165/150, pulse 94, temperature 97.9 F (36.6 C), temperature source Oral, resp. rate 14, height 5\' 6"  (1.676 m), weight 59 kg, SpO2 98 %.  Examination: General appearance: Pleasant elderly woman resting in bed with family bedside.  No distress and feeling better Head: Normocephalic, without obvious abnormality, atraumatic Eyes: EOMI Lungs: clear to auscultation bilaterally Heart: regular rate and rhythm and S1, S2 normal Abdomen: normal findings: bowel sounds normal and soft, non-tender Extremities: No edema Skin: mobility and turgor normal Neurologic: Dysarthria now resolved.  No obvious aphasia.  Strength and sensation intact throughout  Consultants:   Neuro   Procedures:     Data Reviewed: I have personally reviewed following labs and imaging studies Results for orders placed or performed during the hospital encounter of 08/14/20 (from the past 24 hour(s))  Hemoglobin and hematocrit, blood     Status: Abnormal   Collection Time: 08/16/20 11:41 AM  Result Value Ref Range   Hemoglobin 8.8 (L) 12.0 - 15.0 g/dL   HCT 27.6 (L) 36.0 - 46.0 %  Type  and screen [place]     Status: None   Collection Time: 08/16/20  2:41 PM  Result Value Ref Range   ABO/RH(D) B POS    Antibody Screen NEG    Sample Expiration 08/19/2020,2359    Unit Number [id number]    Blood Component Type RED CELLS,LR    Unit division 00    Status of Unit ISSUED,FINAL    Transfusion Status OK TO TRANSFUSE    Crossmatch Result      Compatible Performed at [facility], [address], [place], [place] [address]   Prepare RBC (crossmatch)     Status: None   Collection Time: 08/16/20  2:41 PM  Result Value Ref Range   Order Confirmation      ORDER PROCESSED BY BLOOD BANK Performed at [facility], [place] [address] [place], [place] [address]   Hemoglobin and hematocrit, blood     Status: Abnormal   Collection Time: 08/16/20 10:26 PM  Result Value Ref Range   Hemoglobin 9.6 (L) 12.0 - 15.0 g/dL   HCT 29.6 (L) 36.0 - 46.0 %  CBC     Status: Abnormal   Collection Time: 08/17/20  4:06 AM  Result Value Ref Range   WBC 6.5 4.0 - 10.5 K/uL   RBC 3.55 (L) 3.87 -  5.11 MIL/uL   Hemoglobin 9.6 (L) 12.0 - 15.0 g/dL   HCT 28.5 (L) 36.0 - 46.0 %   MCV 80.3 80.0 - 100.0 fL   MCH 27.0 26.0 - 34.0 pg   MCHC 33.7 30.0 - 36.0 g/dL   RDW 16.8 (H) 11.5 - 15.5 %   Platelets 190 150 - 400 K/uL   nRBC 0.0 0.0 - 0.2 %  Basic metabolic panel     Status: Abnormal   Collection Time: 08/17/20  4:06 AM  Result Value Ref Range   Sodium 141 135 - 145 mmol/L   Potassium 3.7 3.5 - 5.1 mmol/L   Chloride 113 (H) 98 - 111 mmol/L   CO2 21 (L) 22 - 32 mmol/L   Glucose, Bld 96 70 - 99 mg/dL   BUN 9 8 - 23 mg/dL   Creatinine, Ser 1.06 (H) 0.44 - 1.00 mg/dL   Calcium 8.2 (L) 8.9 - 10.3 mg/dL   GFR, Estimated 52 (L) >60 mL/min   Anion gap 7 5 - 15  Magnesium     Status: None   Collection Time: 08/17/20  4:06 AM  Result Value Ref Range   Magnesium 1.7 1.7 - 2.4 mg/dL    Recent Results (from the past 240 hour(s))  Resp Panel by RT-PCR (Flu A&B, Covid)  Nasopharyngeal Swab     Status: None   Collection Time: 08/11/20  9:44 AM   Specimen: Nasopharyngeal Swab; Nasopharyngeal(NP) swabs in vial transport medium  Result Value Ref Range Status   SARS Coronavirus 2 by RT PCR NEGATIVE NEGATIVE Final    Comment: (NOTE) SARS-CoV-2 target nucleic acids are NOT DETECTED.  The SARS-CoV-2 RNA is generally detectable in upper respiratory specimens during the acute phase of infection. The lowest concentration of SARS-CoV-2 viral copies this assay can detect is 138 copies/mL. A negative result does not preclude SARS-Cov-2 infection and should not be used as the sole basis for treatment or other patient management decisions. A negative result may occur with  improper specimen collection/handling, submission of specimen other than nasopharyngeal swab, presence of viral mutation(s) within the areas targeted by this assay, and inadequate number of viral copies(<138 copies/mL). A negative result must be combined with clinical observations, patient history, and epidemiological information. The expected result is Negative.  Fact Sheet for Patients:  EntrepreneurPulse.com.au  Fact Sheet for Healthcare Providers:  IncredibleEmployment.be  This test is no t yet approved or cleared by the [place] FDA and  has been authorized for detection and/or diagnosis of SARS-CoV-2 by FDA under an Emergency Use Authorization (EUA). This EUA will remain  in effect (meaning this test can be used) for the duration of the COVID-19 declaration under Section 564(b)(1) of the Act, 21 U.S.C.section 360bbb-3(b)(1), unless the authorization is terminated  or revoked sooner.       Influenza A by PCR NEGATIVE NEGATIVE Final   Influenza B by PCR NEGATIVE NEGATIVE Final    Comment: (NOTE) The Xpert Xpress SARS-CoV-2/FLU/RSV plus assay is intended as an aid in the diagnosis of influenza from Nasopharyngeal swab specimens and should not be  used as a sole basis for treatment. Nasal washings and aspirates are unacceptable for Xpert Xpress SARS-CoV-2/FLU/RSV testing.  Fact Sheet for Patients: EntrepreneurPulse.com.au  Fact Sheet for Healthcare Providers: IncredibleEmployment.be  This test is not yet approved or cleared by the [place] FDA and has been authorized for detection and/or diagnosis of SARS-CoV-2 by FDA under an Emergency Use Authorization (EUA). This EUA will remain in effect (meaning  this test can be used) for the duration of the COVID-19 declaration under Section 564(b)(1) of the Act, 21 U.S.C. section 360bbb-3(b)(1), unless the authorization is terminated or revoked.  Performed at [facility], [place] [address], [place], [place] [address]   Resp Panel by RT-PCR (Flu A&B, Covid) Nasopharyngeal Swab     Status: None   Collection Time: 08/14/20 12:35 PM   Specimen: Nasopharyngeal Swab; Nasopharyngeal(NP) swabs in vial transport medium  Result Value Ref Range Status   SARS Coronavirus 2 by RT PCR NEGATIVE NEGATIVE Final    Comment: (NOTE) SARS-CoV-2 target nucleic acids are NOT DETECTED.  The SARS-CoV-2 RNA is generally detectable in upper respiratory specimens during the acute phase of infection. The lowest concentration of SARS-CoV-2 viral copies this assay can detect is 138 copies/mL. A negative result does not preclude SARS-Cov-2 infection and should not be used as the sole basis for treatment or other patient management decisions. A negative result may occur with  improper specimen collection/handling, submission of specimen other than nasopharyngeal swab, presence of viral mutation(s) within the areas targeted by this assay, and inadequate number of viral copies(<138 copies/mL). A negative result must be combined with clinical observations, patient history, and epidemiological information. The expected result is Negative.  Fact Sheet for  Patients:  EntrepreneurPulse.com.au  Fact Sheet for Healthcare Providers:  IncredibleEmployment.be  This test is no t yet approved or cleared by the [place] FDA and  has been authorized for detection and/or diagnosis of SARS-CoV-2 by FDA under an Emergency Use Authorization (EUA). This EUA will remain  in effect (meaning this test can be used) for the duration of the COVID-19 declaration under Section 564(b)(1) of the Act, 21 U.S.C.section 360bbb-3(b)(1), unless the authorization is terminated  or revoked sooner.       Influenza A by PCR NEGATIVE NEGATIVE Final   Influenza B by PCR NEGATIVE NEGATIVE Final    Comment: (NOTE) The Xpert Xpress SARS-CoV-2/FLU/RSV plus assay is intended as an aid in the diagnosis of influenza from Nasopharyngeal swab specimens and should not be used as a sole basis for treatment. Nasal washings and aspirates are unacceptable for Xpert Xpress SARS-CoV-2/FLU/RSV testing.  Fact Sheet for Patients: EntrepreneurPulse.com.au  Fact Sheet for Healthcare Providers: IncredibleEmployment.be  This test is not yet approved or cleared by the [place] FDA and has been authorized for detection and/or diagnosis of SARS-CoV-2 by FDA under an Emergency Use Authorization (EUA). This EUA will remain in effect (meaning this test can be used) for the duration of the COVID-19 declaration under Section 564(b)(1) of the Act, 21 U.S.C. section 360bbb-3(b)(1), unless the authorization is terminated or revoked.  Performed at [facility], [place] [address] [place], [place] [address]      Radiology Studies: NM GI Blood Loss  Result Date: 08/15/2020 CLINICAL DATA:  Lower gastrointestinal bleed. Diverticulosis. Hematochezia. EXAM: NUCLEAR MEDICINE GASTROINTESTINAL BLEEDING SCAN TECHNIQUE: Sequential abdominal images were obtained following intravenous administration of Tc-48m labeled red  blood cells. RADIOPHARMACEUTICALS:  21.6 mCi Tc-4m pertechnetate in-vitro labeled red cells. COMPARISON:  Bleeding scan 08/12/2020 FINDINGS: No evidence of active bleeding into the GI tract over 2 hours of imaging. Normal blood pool activity within the vascular structures. Physiologic activity in the kidneys and bladder. IMPRESSION: No evidence of active gastrointestinal bleeding. Electronically Signed   By: Suzy Bouchard M.D.   On: 08/15/2020 19:22   DG Swallowing Func-Speech Pathology  Result Date: 08/16/2020 Objective Swallowing Evaluation: Type of Study: MBS-Modified Barium Swallow Study  Patient Details Name: Amber Barnett MRN: [id number] Date of Birth: 06-02-1934 Today's Date: 08/16/2020 Time: SLP Start Time (ACUTE ONLY): 1010 -SLP Stop Time (ACUTE ONLY): 1025 SLP Time Calculation (min) (ACUTE ONLY): 15 min Past Medical History: Past Medical History: Diagnosis Date . Atrial fibrillation (HCC)  . DVT (deep venous thrombosis) (HCC)  . Dysrhythmia  . History of left breast cancer  . Hypertension  . Stroke [facility])  Past Surgical History: Past Surgical History: Procedure Laterality Date . COLONOSCOPY WITH PROPOFOL N/A 12/29/2019  Procedure: COLONOSCOPY WITH PROPOFOL;  Surgeon: Willis Modena, MD;  Location: [facility] ENDOSCOPY;  Service: Endoscopy;  Laterality: N/A; . IR ANGIO INTRA EXTRACRAN SEL COM CAROTID INNOMINATE UNI R MOD SED  05/18/2017 . IR ANGIO VERTEBRAL SEL SUBCLAVIAN INNOMINATE UNI R MOD SED  05/18/2017 . IR PERCUTANEOUS ART THROMBECTOMY/INFUSION INTRACRANIAL INC DIAG ANGIO  05/18/2017 . IR RADIOLOGIST EVAL & MGMT  07/19/2017 . RADIOLOGY WITH ANESTHESIA N/A 05/18/2017  Procedure: RADIOLOGY WITH ANESTHESIA;  Surgeon: Julieanne Cotton, MD;  Location: [facility] OR;  Service: Radiology;  Laterality: N/A; HPI: Aiden Rao is a 86 y.o. female with medical history significant of diverticulosis with recurrent lower GI bleed, chronic A. fib on Xarelto, HTN, HLD, embolic stroke on the right M1  branch of MCA 2018 status post mechanical embolectomy and revascularization, presented with stroke symptoms.  Imaging revealed R MCA embolic infarct.  Subjective: pleasant, alert Assessment / Plan / Recommendation CHL IP CLINICAL IMPRESSIONS 08/16/2020 Clinical Impression --Patient presents with a mild oropharyngeal dysphagia without penetration or aspiration of any tested boluses (thin liquids, regular solids, barium tablet). Patient exhibited premature spillage of all boluses resulting in swallow initiation delay to level of vallecular sinus. With thin liquids, there were two instances of swallow initiation delay to pyriform sinus. Trace pyriform sinus residuals noted with thin liquids however these cleared with subsequent swallows. Patient does present with a prominent cricopharyngeal bar which did slow transit of puree and regular solid textures, but did not significantly impact patient's swallow function or safety with PO's.  SLP Visit Diagnosis Dysphagia, oropharyngeal phase (R13.12) Attention and concentration deficit following -- Frontal lobe and executive function deficit following -- Impact on safety and function No limitations   CHL IP TREATMENT RECOMMENDATION 08/16/2020 Treatment Recommendations Therapy as outlined in treatment plan below   Prognosis 08/16/2020 Prognosis for Safe Diet Advancement Good Barriers to Reach Goals -- Barriers/Prognosis Comment -- CHL IP DIET RECOMMENDATION 08/16/2020 SLP Diet Recommendations Thin liquid;Regular solids Liquid Administration via Cup;Straw Medication Administration Whole meds with liquid Compensations Slow rate;Small sips/bites;Follow solids with liquid Postural Changes Seated upright at 90 degrees   CHL IP OTHER RECOMMENDATIONS 08/16/2020 Recommended Consults -- Oral Care Recommendations Oral care BID Other Recommendations --   CHL IP FOLLOW UP RECOMMENDATIONS 08/16/2020 Follow up Recommendations 24 hour supervision/assistance;None   CHL IP FREQUENCY AND  DURATION 08/16/2020 Speech Therapy Frequency (ACUTE ONLY) min 1 x/week Treatment Duration --      CHL IP ORAL PHASE 08/16/2020 Oral Phase Impaired Oral - Pudding Teaspoon -- Oral - Pudding Cup -- Oral - Honey Teaspoon -- Oral - Honey Cup -- Oral - Nectar Teaspoon -- Oral - Nectar Cup -- Oral - Nectar Straw -- Oral - Thin Teaspoon -- Oral - Thin Cup Premature spillage Oral - Thin Straw Premature spillage Oral - Puree Premature spillage Oral - Mech Soft -- Oral - Regular Premature spillage Oral - Multi-Consistency -- Oral - Pill Premature spillage Oral Phase - Comment --  CHL IP PHARYNGEAL PHASE 08/16/2020 Pharyngeal  Phase Impaired Pharyngeal- Pudding Teaspoon -- Pharyngeal -- Pharyngeal- Pudding Cup -- Pharyngeal -- Pharyngeal- Honey Teaspoon -- Pharyngeal -- Pharyngeal- Honey Cup -- Pharyngeal -- Pharyngeal- Nectar Teaspoon -- Pharyngeal -- Pharyngeal- Nectar Cup -- Pharyngeal -- Pharyngeal- Nectar Straw -- Pharyngeal -- Pharyngeal- Thin Teaspoon -- Pharyngeal -- Pharyngeal- Thin Cup Delayed swallow initiation-vallecula;Delayed swallow initiation-pyriform sinuses;Pharyngeal residue - pyriform Pharyngeal -- Pharyngeal- Thin Straw Delayed swallow initiation-vallecula;Pharyngeal residue - pyriform Pharyngeal -- Pharyngeal- Puree Delayed swallow initiation-vallecula Pharyngeal -- Pharyngeal- Mechanical Soft -- Pharyngeal -- Pharyngeal- Regular Delayed swallow initiation-vallecula Pharyngeal -- Pharyngeal- Multi-consistency -- Pharyngeal -- Pharyngeal- Pill Delayed swallow initiation-vallecula Pharyngeal -- Pharyngeal Comment --  CHL IP CERVICAL ESOPHAGEAL PHASE 08/16/2020 Cervical Esophageal Phase Impaired Pudding Teaspoon -- Pudding Cup -- Honey Teaspoon -- Honey Cup -- Nectar Teaspoon -- Nectar Cup -- Nectar Straw -- Thin Teaspoon NT Thin Cup Prominent cricopharyngeal segment Thin Straw Prominent cricopharyngeal segment Puree Prominent cricopharyngeal segment Mechanical Soft NT Regular Prominent cricopharyngeal  segment Multi-consistency NT Pill Prominent cricopharyngeal segment Cervical Esophageal Comment Prominent cp bar did result in some slowed bolus transit with purees and with barium tablet but not significanlty impacting swallow overall at this time. Sonia Baller, MA, CCC-SLP Speech Therapy [facility]             ECHOCARDIOGRAM COMPLETE  Result Date: 08/15/2020    ECHOCARDIOGRAM REPORT   Patient Name:   ANAYLA MCEVERS [facility] Date of Exam: 08/15/2020 Medical Rec #:  [id number]                 Height:       66.0 in Accession #:    [id number]                Weight:       130.0 lb Date of Birth:  02-27-1934                BSA:          1.665 m Patient Age:    44 years                  BP:           123/66 mmHg Patient Gender: F                         HR:           68 bpm. Exam Location:  Inpatient Procedure: 2D Echo, Cardiac Doppler and Color Doppler Indications:    Stroke 434.91 / I163.9  History:        Patient has prior history of Echocardiogram examinations, most                 recent 05/20/2017. Stroke, Arrythmias:Atrial Fibrillation; Risk                 Factors:Hypertension. Current use of long term anticoagulation,                 History of left breast cancer (From Hx).  Sonographer:    Alvino Chapel RCS Referring Phys: [id number] [place]  1. Left ventricular ejection fraction, by estimation, is 60 to 65%. The left ventricle has normal function. The left ventricle has no regional wall motion abnormalities. There is severe concentric left ventricular hypertrophy. Left ventricular diastolic  function could not be evaluated. There is the interventricular septum is flattened in systole and diastole, consistent with right ventricular pressure and volume overload.  2.  Right ventricular systolic function is mildly reduced. The right ventricular size is normal. There is mildly elevated pulmonary artery systolic pressure. The estimated right ventricular systolic pressure is 0000000 mmHg.  3.  Left atrial size was moderately dilated.  4. Right atrial size was severely dilated.  5. A small pericardial effusion is present. The pericardial effusion is posterior to the left ventricle.  6. The mitral valve is normal in structure. Moderate mitral valve regurgitation. No evidence of mitral stenosis.  7. Tricuspid valve regurgitation is moderate to severe.  8. The aortic valve is normal in structure. Aortic valve regurgitation is mild. Mild to moderate aortic valve sclerosis/calcification is present, without any evidence of aortic stenosis.  9. The inferior vena cava is dilated in size with >50% respiratory variability, suggesting right atrial pressure of 8 mmHg. FINDINGS  Left Ventricle: Left ventricular ejection fraction, by estimation, is 60 to 65%. The left ventricle has normal function. The left ventricle has no regional wall motion abnormalities. The left ventricular internal cavity size was normal in size. There is  severe concentric left ventricular hypertrophy. The interventricular septum is flattened in systole and diastole, consistent with right ventricular pressure and volume overload. Left ventricular diastolic function could not be evaluated due to atrial fibrillation. Left ventricular diastolic function could not be evaluated. Right Ventricle: The right ventricular size is normal. No increase in right ventricular wall thickness. Right ventricular systolic function is mildly reduced. There is mildly elevated pulmonary artery systolic pressure. The tricuspid regurgitant velocity  is 3.16 m/s, and with an assumed right atrial pressure of 3 mmHg, the estimated right ventricular systolic pressure is 0000000 mmHg. Left Atrium: Left atrial size was moderately dilated. Right Atrium: Right atrial size was severely dilated. Pericardium: A small pericardial effusion is present. The pericardial effusion is posterior to the left ventricle. Mitral Valve: The mitral valve is normal in structure. Moderate mitral  valve regurgitation. No evidence of mitral valve stenosis. MV peak gradient, 7.8 mmHg. The mean mitral valve gradient is 2.0 mmHg. Tricuspid Valve: The tricuspid valve is normal in structure. Tricuspid valve regurgitation is moderate to severe. No evidence of tricuspid stenosis. Aortic Valve: The aortic valve is normal in structure. Aortic valve regurgitation is mild. Aortic regurgitation PHT measures 588 msec. Mild to moderate aortic valve sclerosis/calcification is present, without any evidence of aortic stenosis. Pulmonic Valve: The pulmonic valve was normal in structure. Pulmonic valve regurgitation is trivial. No evidence of pulmonic stenosis. Aorta: The aortic root is normal in size and structure. Venous: The inferior vena cava is dilated in size with greater than 50% respiratory variability, suggesting right atrial pressure of 8 mmHg. IAS/Shunts: No atrial level shunt detected by color flow Doppler.  LEFT VENTRICLE PLAX 2D LVIDd:         3.40 cm LVIDs:         1.70 cm LV PW:         1.20 cm LV IVS:        2.10 cm LVOT diam:     1.60 cm LV SV:         31 LV SV Index:   19 LVOT Area:     2.01 cm  RIGHT VENTRICLE RV S prime:     8.16 cm/s TAPSE (M-mode): 1.3 cm LEFT ATRIUM              Index       RIGHT ATRIUM           Index LA diam:  4.50 cm  2.70 cm/m  RA Area:     33.90 cm LA Vol (A2C):   115.0 ml 69.06 ml/m RA Volume:   112.00 ml 67.26 ml/m LA Vol (A4C):   118.0 ml 70.86 ml/m LA Biplane Vol: 117.0 ml 70.26 ml/m  AORTIC VALVE LVOT Vmax:   81.70 cm/s LVOT Vmean:  55.500 cm/s LVOT VTI:    0.154 m AI PHT:      588 msec  AORTA Ao Root diam: 3.10 cm MITRAL VALVE                TRICUSPID VALVE MV Area (PHT): 5.13 cm     TR Peak grad:   39.9 mmHg MV Peak grad:  7.8 mmHg     TR Vmax:        316.00 cm/s MV Mean grad:  2.0 mmHg MV Vmax:       1.40 m/s     SHUNTS MV Vmean:      52.6 cm/s    Systemic VTI:  0.15 m MV Decel Time: 148 msec     Systemic Diam: 1.60 cm MR Peak grad: 75.7 mmHg MR Mean grad: 52.0  mmHg MR Vmax:      435.00 cm/s MR Vmean:     337.0 cm/s MV E velocity: 123.00 cm/s Ena Dawley MD Electronically signed by Ena Dawley MD Signature Date/Time: 08/15/2020/4:11:21 PM    Final    DG Swallowing Func-Speech Pathology  Final Result    NM GI Blood Loss  Final Result    MR BRAIN WO CONTRAST  Final Result    DG Chest 2 View  Final Result    CT Angio Head W or Wo Contrast  Final Result    CT Angio Neck W and/or Wo Contrast  Final Result      Scheduled Meds: .  stroke: mapping our early stages of recovery book   Does not apply Once  . sodium chloride   Intravenous Once  . apixaban  2.5 mg Oral BID  . atorvastatin  20 mg Oral Daily  . cholecalciferol  2,000 Units Oral Daily  . diltiazem  120 mg Oral Daily  . famotidine  20 mg Oral Daily  . fluticasone furoate-vilanterol  1 puff Inhalation Daily  . hydrocortisone  25 mg Rectal BID  . latanoprost  1 drop Both Eyes QHS  . magnesium oxide  800 mg Oral Daily  . memantine  5 mg Oral BID  . pantoprazole  40 mg Oral Daily   PRN Meds: acetaminophen **OR** acetaminophen (TYLENOL) oral liquid 160 mg/5 mL **OR** acetaminophen, albuterol, diphenhydrAMINE, hydrALAZINE, LORazepam, senna-docusate Continuous Infusions: . sodium chloride 500 mL (08/15/20 2122)     LOS: 3 days  Time spent: Greater than 50% of the 35 minute visit was spent in counseling/coordination of care for the patient as laid out in the A&P.   Dwyane Dee, MD [facility] 08/17/2020, 11:39 AM

## 2020-08-17 NOTE — Plan of Care (Signed)
°  Problem: Education: Goal: Knowledge of General Education information will improve Description: Including pain rating scale, medication(s)/side effects and non-pharmacologic comfort measures Outcome: Progressing   Problem: Health Behavior/Discharge Planning: Goal: Ability to manage health-related needs will improve Outcome: Progressing   Problem: Clinical Measurements: Goal: Ability to maintain clinical measurements within normal limits will improve Outcome: Progressing Goal: Will remain free from infection Outcome: Progressing Goal: Diagnostic test results will improve Outcome: Progressing Goal: Respiratory complications will improve Outcome: Progressing Goal: Cardiovascular complication will be avoided Outcome: Progressing   Problem: Activity: Goal: Risk for activity intolerance will decrease Outcome: Progressing   Problem: Nutrition: Goal: Adequate nutrition will be maintained Outcome: Progressing   Problem: Coping: Goal: Level of anxiety will decrease Outcome: Progressing   Problem: Elimination: Goal: Will not experience complications related to bowel motility Outcome: Progressing Goal: Will not experience complications related to urinary retention Outcome: Progressing   Problem: Pain Managment: Goal: General experience of comfort will improve Outcome: Progressing   Problem: Safety: Goal: Ability to remain free from injury will improve Outcome: Progressing   Problem: Skin Integrity: Goal: Risk for impaired skin integrity will decrease Outcome: Progressing   Problem: Education: Goal: Knowledge of disease or condition will improve Outcome: Progressing Goal: Knowledge of secondary prevention will improve Outcome: Progressing Goal: Knowledge of patient specific risk factors addressed and post discharge goals established will improve Outcome: Progressing Goal: Individualized Educational Video(s) Outcome: Progressing   Problem: Coping: Goal: Will verbalize  positive feelings about self Outcome: Progressing Goal: Will identify appropriate support needs Outcome: Progressing   Problem: Health Behavior/Discharge Planning: Goal: Ability to manage health-related needs will improve Outcome: Progressing   Problem: Self-Care: Goal: Ability to participate in self-care as condition permits will improve Outcome: Progressing Goal: Verbalization of feelings and concerns over difficulty with self-care will improve Outcome: Progressing Goal: Ability to communicate needs accurately will improve Outcome: Progressing   Problem: Nutrition: Goal: Risk of aspiration will decrease Outcome: Progressing   

## 2020-08-17 NOTE — Care Management Important Message (Signed)
Important Message  Patient Details  Name: Amber Barnett MRN: [id number] Date of Birth: 1934/01/23   Medicare Important Message Given:  Yes     Dorena Bodo 08/17/2020, 3:23 PM

## 2020-08-17 NOTE — Progress Notes (Signed)
Physical Therapy Treatment Patient Details Name: Amber Barnett MRN: [id number] DOB: [birth year] Today's Date: 08/17/2020    History of Present Illness Amber Barnett is a 86 y.o. female with medical history significant of diverticulosis with recurrent lower GI bleed, chronic A. 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  Imaging revealed R MCA embolic infarct. Heparin re-started.    PT Comments    Pt reports that she feels well but is frustrated with word finding difficulties. HR fluctuated throughout session regardless of activity, 90's to 137 bpm. Pt ambulated  225' without AD but continues to have left drift and mild unsteadiness. Prefers to remain close to hallway rail which is not her baseline. Discussed possible cane use for community distances. Changing d/c rec to HHPT for balance program. PT will continue to follow.   Follow Up Recommendations  Supervision/Assistance - 24 hour;Home health PT (24/7 initially (niece))     Equipment Recommendations  None recommended by PT    Recommendations for Other Services       Precautions / Restrictions Precautions Precautions: Fall;Other (comment) Precaution Comments: watch HR; GI bleed Restrictions Weight Bearing Restrictions: No    Mobility  Bed Mobility Overal bed mobility: Modified Independent             General bed mobility comments: HOB elevated, no use of bed rail, increased time but no physical assist required  Transfers Overall transfer level: Needs assistance Equipment used: None Transfers: Sit to/from Stand Sit to Stand: Supervision         General transfer comment: no difficulty standing from recliner or bed. HR fluctuated throughout activity, not directly correlated with what she was doing, 90's to 137 bpm  Ambulation/Gait Ambulation/Gait assistance: Min  guard;Min assist Gait Distance (Feet): 225 Feet Assistive device: None Gait Pattern/deviations: Step-through pattern;Decreased stride length;Drifts right/left Gait velocity: decreased Gait velocity interpretation: <1.8 ft/sec, indicate of risk for recurrent falls General Gait Details: veering to L initially but pt aware and stopped and reset self. No support first 38' then pt moved to wall with access to rail if she needed. Did not hold rail but hovered hand over. Discussed possible use of cane or RW for longer distances since she is mildly unsteady and she has this equipment available. HR fluctuated 91-137 bpm   Stairs             Wheelchair Mobility    Modified Rankin (Stroke Patients Only) Modified Rankin (Stroke Patients Only) Pre-Morbid Rankin Score: No significant disability Modified Rankin: Moderately severe disability     Balance Overall balance assessment: Needs assistance Sitting-balance support: Feet supported;No upper extremity supported Sitting balance-Leahy Scale: Good Sitting balance - Comments: pt able to reach to feet to pull up socks before ambulating   Standing balance support: During functional activity;No upper extremity supported Standing balance-Leahy Scale: Good Standing balance comment: pt able to reach out of BOS but limitations evident especially when fatigued                            Cognition Arousal/Alertness: Awake/alert Behavior During Therapy: WFL for tasks assessed/performed Overall Cognitive Status: Within Functional Limits for tasks assessed  Exercises General Exercises - Lower Extremity Long Arc Quad: AROM;Both;10 reps;Seated Hip Flexion/Marching: AROM;Both;10 reps;Seated    General Comments General comments (skin integrity, edema, etc.): discussed her word finding difficulties and encouraged her to be patient and keep trying      Pertinent Vitals/Pain Pain  Assessment: No/denies pain    Home Living                      Prior Function            PT Goals (current goals can now be found in the care plan section) Acute Rehab PT Goals Patient Stated Goal: home to her dog, Cream PT Goal Formulation: With patient Time For Goal Achievement: 08/29/20 Potential to Achieve Goals: Good Progress towards PT goals: Progressing toward goals    Frequency    Min 4X/week      PT Plan Discharge plan needs to be updated    Co-evaluation              AM-PAC PT "6 Clicks" Mobility   Outcome Measure  Help needed turning from your back to your side while in a flat bed without using bedrails?: None Help needed moving from lying on your back to sitting on the side of a flat bed without using bedrails?: None Help needed moving to and from a bed to a chair (including a wheelchair)?: A Little Help needed standing up from a chair using your arms (e.g., wheelchair or bedside chair)?: A Little Help needed to walk in hospital room?: A Little Help needed climbing 3-5 steps with a railing? : A Little 6 Click Score: 20    End of Session Equipment Utilized During Treatment: Gait belt Activity Tolerance: Patient tolerated treatment well Patient left: in chair;with call bell/phone within reach Nurse Communication: Mobility status PT Visit Diagnosis: Unsteadiness on feet (R26.81);Muscle weakness (generalized) (M62.81)     Time: [id number] PT Time Calculation (min) (ACUTE ONLY): 25 min  Charges:  $Gait Training: 8-22 mins $Therapeutic Activity: 8-22 mins                     Leighton Roach, PT  Acute Rehab Services  Pager [phone] Office [place] 08/17/2020, 12:50 PM

## 2020-08-18 DIAGNOSIS — I4821 Permanent atrial fibrillation: Secondary | ICD-10-CM | POA: Diagnosis not present

## 2020-08-18 DIAGNOSIS — K922 Gastrointestinal hemorrhage, unspecified: Secondary | ICD-10-CM | POA: Diagnosis not present

## 2020-08-18 LAB — CBC
HCT: 28.4 % — ABNORMAL LOW (ref 36.0–46.0)
Hemoglobin: 9.4 g/dL — ABNORMAL LOW (ref 12.0–15.0)
MCH: 26.8 pg (ref 26.0–34.0)
MCHC: 33.1 g/dL (ref 30.0–36.0)
MCV: 80.9 fL (ref 80.0–100.0)
Platelets: 198 10*3/uL (ref 150–400)
RBC: 3.51 MIL/uL — ABNORMAL LOW (ref 3.87–5.11)
RDW: 17.2 % — ABNORMAL HIGH (ref 11.5–15.5)
WBC: 8.2 10*3/uL (ref 4.0–10.5)
nRBC: 0 % (ref 0.0–0.2)

## 2020-08-18 LAB — BASIC METABOLIC PANEL
Anion gap: 9 (ref 5–15)
BUN: 7 mg/dL — ABNORMAL LOW (ref 8–23)
CO2: 22 mmol/L (ref 22–32)
Calcium: 8.5 mg/dL — ABNORMAL LOW (ref 8.9–10.3)
Chloride: 109 mmol/L (ref 98–111)
Creatinine, Ser: 1.03 mg/dL — ABNORMAL HIGH (ref 0.44–1.00)
GFR, Estimated: 54 mL/min — ABNORMAL LOW (ref >60)
Glucose, Bld: 99 mg/dL (ref 70–99)
Potassium: 3.6 mmol/L (ref 3.5–5.1)
Sodium: 140 mmol/L (ref 135–145)

## 2020-08-18 LAB — MAGNESIUM: Magnesium: 1.8 mg/dL (ref 1.7–2.4)

## 2020-08-18 MED ORDER — MAGNESIUM OXIDE 400 (241.3 MG) MG PO TABS
200.0000 mg | ORAL_TABLET | Freq: Every day | ORAL | Status: DC
  Administered 2020-08-19 – 2020-08-23 (×5): 200 mg via ORAL
  Filled 2020-08-18 (×5): qty 1

## 2020-08-18 MED ORDER — SIMETHICONE 80 MG PO CHEW
80.0000 mg | CHEWABLE_TABLET | Freq: Four times a day (QID) | ORAL | Status: DC | PRN
  Administered 2020-08-18 – 2020-08-20 (×2): 80 mg via ORAL
  Filled 2020-08-18 (×4): qty 1

## 2020-08-18 MED ORDER — WHITE PETROLATUM EX OINT
TOPICAL_OINTMENT | CUTANEOUS | Status: AC
  Administered 2020-08-18: 0.2
  Filled 2020-08-18: qty 28.35

## 2020-08-18 MED ORDER — DILTIAZEM HCL ER COATED BEADS 120 MG PO CP24
120.0000 mg | ORAL_CAPSULE | Freq: Every day | ORAL | Status: DC
Start: 2020-08-18 — End: 2020-08-19

## 2020-08-18 NOTE — Progress Notes (Addendum)
PROGRESS NOTE    Amber Barnett   [id number]  DOB: 1934/06/18  DOA: 08/14/2020     4  PCP: Egbert Garibaldi, PA-C  CC: Dysarthria and facial droop  Hospital Course: Mr. Compo is an 86 yo female with PMH afib (on xarelto), HTN, diverticulosis, CVA (2018 s/p mech embolectomy and revasc), GIB, DVT who presented with severe dysarthria, left facial droop. -She was just hospitalized from 12/22 to 12/24 for hematochezia.  She underwent a bleeding scan which was negative.  Hemoglobin was monitored and remained stable; her Xarelto was held during admission and resumed at discharge. Last colonoscopy was in May 2021 for rectal bleeding which showed internal hemorrhoids and diverticulosis with a negative bleeding scan at that time as well. -She underwent stroke work-up and was found to have a small to moderate sized acute right MCA infarct on MRI brain.  CTA head/neck showed right M2/3 branch occlusion with completed insular and opercular infarction.  A discussion was held on admission regarding bleed risk versus stroke risk.  Overall the patient and family elected to remain on anticoagulation and she was placed on a heparin drip.  The the night following admission and into the morning she developed 2 large bloody bowel movements with associated hypotension.  Her heparin drip was then discontinued and she underwent repeat bleeding scan which was also negative.  Hgb continued to downtrend and she was given 1 unit PRBC on 12/27.    Interval History:  -Feels well, no complaints, did have a bowel movement yesterday with a small streak of blood  Assessment & Plan:  CVA (cerebral vascular accident) ([place]) -Presented to the ED with dysarthria and left facial droop  -Recently hospitalized with lower GI bleed, Xarelto was held on admission 12/22 -12/24 and then resumed at discharge. Unfortunately on 12/25 presented with acute CVA -Followed by neurology this admission - MRI brain shows  "Right M2/3 branch occlusion with completed insular and opercular infarction. Regional M3/4 branch is appear asymmetrically irregular, likely from showering of smaller emboli in this territory." -Was started on IV heparin on admission which was briefly held in the setting of recurrent lower GI bleed, which has since subsided, restarted on Eliquis 2.5 mg twice daily yesterday -Continue statin -PT OT eval completed, recommendations for supervision only and home safety eval  GIB (gastrointestinal bleeding) -History of diverticular bleed.  Just hospitalized from 12/22 to 12/24 for likely recurrent diverticular bleed, negative bleeding scan.  Discussion was held on admission regarding risks and benefits of ongoing anticoagulation.  -On 12/26 she had recurrent bleeding, heparin was held temporarily on account of this, bleeding scan was ordered, she was transfused 1 unit of PRBC on 12/27 -Subsequently bleeding has resolved, Eliquis restarted 12/28-due to high stroke risk -Hemoglobin stable, check CBC in a.m.  Permanent atrial fibrillation (HCC) -Restart Cardizem and continue Eliquis, see discussions above  Essential hypertension -Restart Cardizem    DVT prophylaxis: Eliquis from 08/17/2020 Code Status: Intubation but no CPR Family Communication: Brother bedside Disposition Plan: Status is: Inpatient  Remains inpatient appropriate because:Hemodynamically unstable, Ongoing diagnostic testing needed not appropriate for outpatient work up, Unsafe d/c plan, IV treatments appropriate due to intensity of illness or inability to take PO and Inpatient level of care appropriate due to severity of illness   Dispo: The patient is from: Home              Anticipated d/c is to: Home              Anticipated  d/c date is: Tomorrow              Patient currently is not medically stable to d/c.   Objective: Blood pressure 118/80, pulse 84, temperature 98.4 F (36.9 C), temperature source Oral, resp.  rate 18, height 5\' 6"  (1.676 m), weight 59 kg, SpO2 94 %.  Examination: Gen: Pleasant elderly female sitting up in bed awake, Alert, Oriented X 3, no distress Lungs: Clear bilaterally CVS: S1-S2, regular rate rhythm Abd: soft, Non tender, non distended, BS present Extremities: No edema Skin: no new rashes Neurologic: No obvious dysarthria or aphasia at this time, motor strength 4+ bilaterally, sensations intact  Consultants:   Neuro   Procedures:     Data Reviewed: I have personally reviewed following labs and imaging studies Results for orders placed or performed during the hospital encounter of 08/14/20 (from the past 24 hour(s))  Hemoglobin and hematocrit, blood     Status: Abnormal   Collection Time: 08/17/20  5:49 PM  Result Value Ref Range   Hemoglobin 9.9 (L) 12.0 - 15.0 g/dL   HCT 30.0 (L) 36.0 - 46.0 %  CBC     Status: Abnormal   Collection Time: 08/18/20  2:47 AM  Result Value Ref Range   WBC 8.2 4.0 - 10.5 K/uL   RBC 3.51 (L) 3.87 - 5.11 MIL/uL   Hemoglobin 9.4 (L) 12.0 - 15.0 g/dL   HCT 28.4 (L) 36.0 - 46.0 %   MCV 80.9 80.0 - 100.0 fL   MCH 26.8 26.0 - 34.0 pg   MCHC 33.1 30.0 - 36.0 g/dL   RDW 17.2 (H) 11.5 - 15.5 %   Platelets 198 150 - 400 K/uL   nRBC 0.0 0.0 - 0.2 %  Basic metabolic panel     Status: Abnormal   Collection Time: 08/18/20  2:47 AM  Result Value Ref Range   Sodium 140 135 - 145 mmol/L   Potassium 3.6 3.5 - 5.1 mmol/L   Chloride 109 98 - 111 mmol/L   CO2 22 22 - 32 mmol/L   Glucose, Bld 99 70 - 99 mg/dL   BUN 7 (L) 8 - 23 mg/dL   Creatinine, Ser 1.03 (H) 0.44 - 1.00 mg/dL   Calcium 8.5 (L) 8.9 - 10.3 mg/dL   GFR, Estimated 54 (L) >60 mL/min   Anion gap 9 5 - 15  Magnesium     Status: None   Collection Time: 08/18/20  2:47 AM  Result Value Ref Range   Magnesium 1.8 1.7 - 2.4 mg/dL    Recent Results (from the past 240 hour(s))  Resp Panel by RT-PCR (Flu A&B, Covid) Nasopharyngeal Swab     Status: None   Collection Time: 08/11/20   9:44 AM   Specimen: Nasopharyngeal Swab; Nasopharyngeal(NP) swabs in vial transport medium  Result Value Ref Range Status   SARS Coronavirus 2 by RT PCR NEGATIVE NEGATIVE Final    Comment: (NOTE) SARS-CoV-2 target nucleic acids are NOT DETECTED.  The SARS-CoV-2 RNA is generally detectable in upper respiratory specimens during the acute phase of infection. The lowest concentration of SARS-CoV-2 viral copies this assay can detect is 138 copies/mL. A negative result does not preclude SARS-Cov-2 infection and should not be used as the sole basis for treatment or other patient management decisions. A negative result may occur with  improper specimen collection/handling, submission of specimen other than nasopharyngeal swab, presence of viral mutation(s) within the areas targeted by this assay, and inadequate number of viral copies(<138 copies/mL).  A negative result must be combined with clinical observations, patient history, and epidemiological information. The expected result is Negative.  Fact Sheet for Patients:  BloggerCourse.com  Fact Sheet for Healthcare Providers:  SeriousBroker.it  This test is no t yet approved or cleared by the [place] FDA and  has been authorized for detection and/or diagnosis of SARS-CoV-2 by FDA under an Emergency Use Authorization (EUA). This EUA will remain  in effect (meaning this test can be used) for the duration of the COVID-19 declaration under Section 564(b)(1) of the Act, 21 U.S.C.section 360bbb-3(b)(1), unless the authorization is terminated  or revoked sooner.       Influenza A by PCR NEGATIVE NEGATIVE Final   Influenza B by PCR NEGATIVE NEGATIVE Final    Comment: (NOTE) The Xpert Xpress SARS-CoV-2/FLU/RSV plus assay is intended as an aid in the diagnosis of influenza from Nasopharyngeal swab specimens and should not be used as a sole basis for treatment. Nasal washings and aspirates  are unacceptable for Xpert Xpress SARS-CoV-2/FLU/RSV testing.  Fact Sheet for Patients: BloggerCourse.com  Fact Sheet for Healthcare Providers: SeriousBroker.it  This test is not yet approved or cleared by the [place] FDA and has been authorized for detection and/or diagnosis of SARS-CoV-2 by FDA under an Emergency Use Authorization (EUA). This EUA will remain in effect (meaning this test can be used) for the duration of the COVID-19 declaration under Section 564(b)(1) of the Act, 21 U.S.C. section 360bbb-3(b)(1), unless the authorization is terminated or revoked.  Performed at [facility], [address] [place], [place] [address]   Resp Panel by RT-PCR (Flu A&B, Covid) Nasopharyngeal Swab     Status: None   Collection Time: 08/14/20 12:35 PM   Specimen: Nasopharyngeal Swab; Nasopharyngeal(NP) swabs in vial transport medium  Result Value Ref Range Status   SARS Coronavirus 2 by RT PCR NEGATIVE NEGATIVE Final    Comment: (NOTE) SARS-CoV-2 target nucleic acids are NOT DETECTED.  The SARS-CoV-2 RNA is generally detectable in upper respiratory specimens during the acute phase of infection. The lowest concentration of SARS-CoV-2 viral copies this assay can detect is 138 copies/mL. A negative result does not preclude SARS-Cov-2 infection and should not be used as the sole basis for treatment or other patient management decisions. A negative result may occur with  improper specimen collection/handling, submission of specimen other than nasopharyngeal swab, presence of viral mutation(s) within the areas targeted by this assay, and inadequate number of viral copies(<138 copies/mL). A negative result must be combined with clinical observations, patient history, and epidemiological information. The expected result is Negative.  Fact Sheet for Patients:  BloggerCourse.com  Fact Sheet  for Healthcare Providers:  SeriousBroker.it  This test is no t yet approved or cleared by the [place] FDA and  has been authorized for detection and/or diagnosis of SARS-CoV-2 by FDA under an Emergency Use Authorization (EUA). This EUA will remain  in effect (meaning this test can be used) for the duration of the COVID-19 declaration under Section 564(b)(1) of the Act, 21 U.S.C.section 360bbb-3(b)(1), unless the authorization is terminated  or revoked sooner.       Influenza A by PCR NEGATIVE NEGATIVE Final   Influenza B by PCR NEGATIVE NEGATIVE Final    Comment: (NOTE) The Xpert Xpress SARS-CoV-2/FLU/RSV plus assay is intended as an aid in the diagnosis of influenza from Nasopharyngeal swab specimens and should not be used as a sole basis for treatment. Nasal washings and aspirates are unacceptable for Xpert Xpress SARS-CoV-2/FLU/RSV  testing.  Fact Sheet for Patients: EntrepreneurPulse.com.au  Fact Sheet for Healthcare Providers: IncredibleEmployment.be  This test is not yet approved or cleared by the [place] FDA and has been authorized for detection and/or diagnosis of SARS-CoV-2 by FDA under an Emergency Use Authorization (EUA). This EUA will remain in effect (meaning this test can be used) for the duration of the COVID-19 declaration under Section 564(b)(1) of the Act, 21 U.S.C. section 360bbb-3(b)(1), unless the authorization is terminated or revoked.  Performed at [facility], [place] [address] [place], [place] [address]      Radiology Studies: No results found. DG Swallowing Func-Speech Pathology  Final Result    NM GI Blood Loss  Final Result    MR BRAIN WO CONTRAST  Final Result    DG Chest 2 View  Final Result    CT Angio Head W or Wo Contrast  Final Result    CT Angio Neck W and/or Wo Contrast  Final Result      Scheduled Meds: .  stroke: mapping our early stages of  recovery book   Does not apply Once  . sodium chloride   Intravenous Once  . apixaban  2.5 mg Oral BID  . atorvastatin  20 mg Oral Daily  . cholecalciferol  2,000 Units Oral Daily  . diltiazem  120 mg Oral Daily  . famotidine  20 mg Oral Daily  . fluticasone furoate-vilanterol  1 puff Inhalation Daily  . hydrocortisone  25 mg Rectal BID  . latanoprost  1 drop Both Eyes QHS  . magnesium oxide  800 mg Oral Daily  . memantine  5 mg Oral BID  . pantoprazole  40 mg Oral Daily   PRN Meds: acetaminophen **OR** acetaminophen (TYLENOL) oral liquid 160 mg/5 mL **OR** acetaminophen, albuterol, diphenhydrAMINE, hydrALAZINE, LORazepam, senna-docusate Continuous Infusions:    LOS: 4 days  Time spent: Greater than 50% of the 35 minute visit was spent in counseling/coordination of care for the patient as laid out in the A&P.   Domenic Polite, MD [facility] 08/18/2020, 11:06 AM

## 2020-08-18 NOTE — Progress Notes (Signed)
Physical Therapy Treatment Patient Details Name: Amber Barnett MRN: [id number] DOB: Nov 18, 1933 Today's Date: 08/18/2020    History of Present Illness Amber Barnett is a 86 y.o. female with medical history significant of diverticulosis with recurrent lower GI bleed, chronic A. 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  Imaging revealed R MCA embolic infarct. Heparin re-started.    PT Comments    Pt tolerates treatment well initially despite tachycardia. Pt is able to ambulate household distances without noticeable drift this session. Pt completes ADL tasks including toileting and handwashing without physical assistance. At the end of session pt does intermittently report dizziness, limiting her ability to participate in further dynamic balance training. Pt will benefit from continued acute PT POC to improve dynamic gait and balance quality and to increase activity tolerance. PT continues to recommend discharge home with HHPT and supervision from family.  Follow Up Recommendations  Home health PT;Supervision/Assistance - 24 hour     Equipment Recommendations  None recommended by PT    Recommendations for Other Services       Precautions / Restrictions Precautions Precautions: Fall;Other (comment) Precaution Comments: watch HR; GI bleed Restrictions Weight Bearing Restrictions: No    Mobility  Bed Mobility Overal bed mobility: Modified Independent             General bed mobility comments: supine to sit with increased time  Transfers Overall transfer level: Needs assistance Equipment used: None Transfers: Sit to/from Stand Sit to Stand: Supervision            Ambulation/Gait Ambulation/Gait assistance: Min guard;Supervision Gait Distance (Feet): 250 Feet Assistive device: None Gait Pattern/deviations: Step-through  pattern Gait velocity: reduced Gait velocity interpretation: <1.8 ft/sec, indicate of risk for recurrent falls General Gait Details: pt with slowed step-through gait, PT initially providing minG for safety, close supervision by end of session   Stairs             Wheelchair Mobility    Modified Rankin (Stroke Patients Only) Modified Rankin (Stroke Patients Only) Pre-Morbid Rankin Score: No significant disability Modified Rankin: Moderately severe disability     Balance Overall balance assessment: Needs assistance Sitting-balance support: No upper extremity supported;Feet supported Sitting balance-Leahy Scale: Good     Standing balance support: No upper extremity supported;During functional activity Standing balance-Leahy Scale: Good                              Cognition Arousal/Alertness: Awake/alert Behavior During Therapy: WFL for tasks assessed/performed Overall Cognitive Status: Within Functional Limits for tasks assessed                                        Exercises      General Comments General comments (skin integrity, edema, etc.): pt tachy during session with HR ranging from 125-161 when ambulating. HR in low 100s at rest. Pt reports some SOB with activity, SpO2 reading labile during session due to poor wavelength but is in 90s when providing a reliable waveform. Pt also reports some dizziness near the end of session      Pertinent Vitals/Pain Pain Assessment: No/denies pain    Home Living  Prior Function            PT Goals (current goals can now be found in the care plan section) Acute Rehab PT Goals Patient Stated Goal: home to her dog, Cream Progress towards PT goals: Progressing toward goals    Frequency    Min 4X/week      PT Plan Discharge plan needs to be updated    Co-evaluation              AM-PAC PT "6 Clicks" Mobility   Outcome Measure  Help needed  turning from your back to your side while in a flat bed without using bedrails?: None Help needed moving from lying on your back to sitting on the side of a flat bed without using bedrails?: None Help needed moving to and from a bed to a chair (including a wheelchair)?: A Little Help needed standing up from a chair using your arms (e.g., wheelchair or bedside chair)?: A Little Help needed to walk in hospital room?: A Little Help needed climbing 3-5 steps with a railing? : A Little 6 Click Score: 20    End of Session   Activity Tolerance: Patient tolerated treatment well Patient left: in chair;with call bell/phone within reach;with chair alarm set Nurse Communication: Mobility status PT Visit Diagnosis: Unsteadiness on feet (R26.81);Muscle weakness (generalized) (M62.81)     Time: 2595-6387 PT Time Calculation (min) (ACUTE ONLY): 29 min  Charges:  $Gait Training: 8-22 mins $Therapeutic Activity: 8-22 mins                     [name], PT, DPT Acute Rehabilitation Pager: [phone]    [name] 08/18/2020, 3:37 PM

## 2020-08-18 NOTE — Progress Notes (Signed)
  Speech Language Pathology Treatment: Dysphagia  Patient Details Name: Amber Barnett MRN: [id number] DOB: April 18, 1934 Today's Date: 08/18/2020 Time: 6754-4920 SLP Time Calculation (min) (ACUTE ONLY): 16 min  Assessment / Plan / Recommendation Clinical Impression  Skilled treatment with intake of regular/thin liquids post-MBS recommendation for this consistency with snack with pt given mod I cues for general swallowing strategies without overt s/s of aspiration present during entire PO consumption.  Pt stated she "gets choked up on her saliva sometimes, but not during meals."  Educated pt re: sensory changes with CVA which may precipitate this occurrence and pt agreed.  This occurs infrequently (1-2x per day only) and was not observed during dysphagia tx.  Pt given strategies for slow, small bites and sips d/t recent stroke when consuming any POs and pt agreed and was able to reiterate strategies.  ST will s/o at this time as pt is independent with strategies and consuming current diet without overt s/s of aspiration/dysphagia present.    HPI HPI: Anjeanette Petzold is a 86 y.o. female with medical history significant of diverticulosis with recurrent lower GI bleed, chronic A. 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  Imaging revealed R MCA embolic infarct.      SLP Plan  Discharge SLP treatment due to (comment) (goals met)       Recommendations  Diet recommendations: Thin liquid;Regular Liquids provided via: Cup;Straw Medication Administration: Whole meds with liquid Supervision: Patient able to self feed Compensations: Slow rate;Small sips/bites                Oral Care Recommendations: Oral care BID Follow up Recommendations: None SLP Visit Diagnosis: Dysphagia, unspecified (R13.10) Plan: Discharge SLP treatment due to (comment) (goals  met)                       Elvina Sidle, M.S., CCC-SLP 08/18/2020, 12:00 PM

## 2020-08-18 NOTE — Progress Notes (Signed)
Purwick leaking, pt, pads, and device changed. Located knot in tubing. Knot removed, suction now effective

## 2020-08-19 DIAGNOSIS — I4821 Permanent atrial fibrillation: Secondary | ICD-10-CM | POA: Diagnosis not present

## 2020-08-19 DIAGNOSIS — K922 Gastrointestinal hemorrhage, unspecified: Secondary | ICD-10-CM | POA: Diagnosis not present

## 2020-08-19 LAB — CBC
HCT: 27.7 % — ABNORMAL LOW (ref 36.0–46.0)
HCT: 29.9 % — ABNORMAL LOW (ref 36.0–46.0)
Hemoglobin: 8.7 g/dL — ABNORMAL LOW (ref 12.0–15.0)
Hemoglobin: 9.4 g/dL — ABNORMAL LOW (ref 12.0–15.0)
MCH: 25.6 pg — ABNORMAL LOW (ref 26.0–34.0)
MCH: 25.9 pg — ABNORMAL LOW (ref 26.0–34.0)
MCHC: 31.4 g/dL (ref 30.0–36.0)
MCHC: 31.4 g/dL (ref 30.0–36.0)
MCV: 81.5 fL (ref 80.0–100.0)
MCV: 82.4 fL (ref 80.0–100.0)
Platelets: 222 10*3/uL (ref 150–400)
Platelets: 247 10*3/uL (ref 150–400)
RBC: 3.4 MIL/uL — ABNORMAL LOW (ref 3.87–5.11)
RBC: 3.63 MIL/uL — ABNORMAL LOW (ref 3.87–5.11)
RDW: 17.1 % — ABNORMAL HIGH (ref 11.5–15.5)
RDW: 17.2 % — ABNORMAL HIGH (ref 11.5–15.5)
WBC: 8.8 10*3/uL (ref 4.0–10.5)
WBC: 9.7 10*3/uL (ref 4.0–10.5)
nRBC: 0 % (ref 0.0–0.2)
nRBC: 0 % (ref 0.0–0.2)

## 2020-08-19 LAB — BASIC METABOLIC PANEL
Anion gap: 8 (ref 5–15)
BUN: 12 mg/dL (ref 8–23)
CO2: 24 mmol/L (ref 22–32)
Calcium: 8.4 mg/dL — ABNORMAL LOW (ref 8.9–10.3)
Chloride: 106 mmol/L (ref 98–111)
Creatinine, Ser: 1.1 mg/dL — ABNORMAL HIGH (ref 0.44–1.00)
GFR, Estimated: 50 mL/min — ABNORMAL LOW (ref >60)
Glucose, Bld: 95 mg/dL (ref 70–99)
Potassium: 3.8 mmol/L (ref 3.5–5.1)
Sodium: 138 mmol/L (ref 135–145)

## 2020-08-19 MED ORDER — SODIUM CHLORIDE 0.9 % IV SOLN
510.0000 mg | Freq: Once | INTRAVENOUS | Status: AC
  Administered 2020-08-19: 13:00:00 510 mg via INTRAVENOUS
  Filled 2020-08-19: qty 17

## 2020-08-19 NOTE — Progress Notes (Signed)
PROGRESS NOTE    Giselda Goldbach   [id number]  DOB: [birth year]  DOA: 08/14/2020     5  PCP: Egbert Garibaldi, PA-C  CC: Dysarthria and facial droop  Hospital Course: Mr. Falconer is an 86 yo female with PMH afib (on xarelto), HTN, diverticulosis, CVA (2018 s/p mech embolectomy and revasc), GIB, DVT who presented with severe dysarthria, left facial droop. -She was just hospitalized from 12/22 to 12/24 for hematochezia.  She underwent a bleeding scan which was negative.  Hemoglobin was monitored and remained stable; her Xarelto was held during admission and resumed at discharge. Last colonoscopy was in May 2021 for rectal bleeding which showed internal hemorrhoids and diverticulosis with a negative bleeding scan at that time as well. -She underwent stroke work-up and was found to have a small to moderate sized acute right MCA infarct on MRI brain.  CTA head/neck showed right M2/3 branch occlusion with completed insular and opercular infarction.  A discussion was held on admission regarding bleed risk versus stroke risk.  Overall the patient and family elected to remain on anticoagulation and she was placed on a heparin drip.  The the night following admission and into the morning she developed 2 large bloody bowel movements with associated hypotension.  Her heparin drip was then discontinued and she underwent repeat bleeding scan which was also negative.  Hgb continued to downtrend and she was given 1 unit PRBC on 12/27.    Interval History:  -Feels okay overall, did ambulate in the halls yesterday, had 2 bowel movements yesterday 1 of which had small amounts of maroon blood  Assessment & Plan:  CVA (cerebral vascular accident) ([place]) -Presented to the ED with dysarthria and left facial droop  -Recently hospitalized with lower GI bleed, Xarelto was held on admission 12/22 -12/24 and then resumed at discharge. Unfortunately on 12/25 presented with acute CVA -Followed by  neurology this admission - MRI brain shows "Right M2/3 branch occlusion with completed insular and opercular infarction. Regional M3/4 branch is appear asymmetrically irregular, likely from showering of smaller emboli in this territory." -Was started on IV heparin on admission which was briefly held in the setting of recurrent lower GI bleed, which has since considerably improved, restarted on Eliquis 2.5 mg twice daily 12/28 -Continue statin -PT OT eval completed, recommendations for supervision only and home safety eval  GIB (gastrointestinal bleeding) -History of diverticular bleed.  Just hospitalized from 12/22 to 12/24 for likely recurrent diverticular bleed, negative bleeding scan.  Discussion was held on admission regarding risks and benefits of ongoing anticoagulation, see discussion above.  -On 12/26 she had recurrent bleeding, heparin was held temporarily on account of this, bleeding scan was negative, she was transfused 1 unit of PRBC on 12/27 -Subsequently bleeding has considerably improved, Eliquis restarted 12/28-due to high stroke risk -Mild downtrend in hemoglobin noted, trace blood in stools yesterday, give IV iron x1, CBC this afternoon and in a.m.  Permanent atrial fibrillation (HCC) -Continue Cardizem and Eliquis, see discussions above  Essential hypertension -Continue Cardizem   DVT prophylaxis: Eliquis from 08/17/2020 Code Status: Intubation but no CPR Family Communication: Brother bedside Disposition Plan: Status is: Inpatient  Remains inpatient appropriate because ongoing lower GI bleed  Dispo: The patient is from: Home              Anticipated d/c is to: Home              Anticipated d/c date is: Later today or tomorrow if bleeding resolves  Patient currently is not medically stable to d/c.   Objective: Blood pressure 131/72, pulse 64, temperature 98.8 F (37.1 C), temperature source Oral, resp. rate 20, height 5\' 6"  (1.676 m), weight 59 kg,  SpO2 98 %.  Examination: Gen: Pleasant elderly female sitting up in bed, awake alert oriented x3, no distress Lungs: Clear bilaterally CVS: S1-S2, regular rate rhythm Abdomen: Soft, nontender, bowel sounds present Extremities: No edema  Skin: no new rashes Neurologic: No obvious dysarthria or aphasia at this time, motor strength 4+ bilaterally, sensations intact  Consultants:   Neuro   Procedures:     Data Reviewed: I have personally reviewed following labs and imaging studies Results for orders placed or performed during the hospital encounter of 08/14/20 (from the past 24 hour(s))  CBC     Status: Abnormal   Collection Time: 08/19/20  2:57 AM  Result Value Ref Range   WBC 8.8 4.0 - 10.5 K/uL   RBC 3.40 (L) 3.87 - 5.11 MIL/uL   Hemoglobin 8.7 (L) 12.0 - 15.0 g/dL   HCT 08/21/20 (L) 46.5 - 68.1 %   MCV 81.5 80.0 - 100.0 fL   MCH 25.6 (L) 26.0 - 34.0 pg   MCHC 31.4 30.0 - 36.0 g/dL   RDW 27.5 (H) 17.0 - 01.7 %   Platelets 222 150 - 400 K/uL   nRBC 0.0 0.0 - 0.2 %  Basic metabolic panel     Status: Abnormal   Collection Time: 08/19/20  2:57 AM  Result Value Ref Range   Sodium 138 135 - 145 mmol/L   Potassium 3.8 3.5 - 5.1 mmol/L   Chloride 106 98 - 111 mmol/L   CO2 24 22 - 32 mmol/L   Glucose, Bld 95 70 - 99 mg/dL   BUN 12 8 - 23 mg/dL   Creatinine, Ser 08/21/20 (H) 0.44 - 1.00 mg/dL   Calcium 8.4 (L) 8.9 - 10.3 mg/dL   GFR, Estimated 50 (L) >60 mL/min   Anion gap 8 5 - 15    Recent Results (from the past 240 hour(s))  Resp Panel by RT-PCR (Flu A&B, Covid) Nasopharyngeal Swab     Status: None   Collection Time: 08/11/20  9:44 AM   Specimen: Nasopharyngeal Swab; Nasopharyngeal(NP) swabs in vial transport medium  Result Value Ref Range Status   SARS Coronavirus 2 by RT PCR NEGATIVE NEGATIVE Final    Comment: (NOTE) SARS-CoV-2 target nucleic acids are NOT DETECTED.  The SARS-CoV-2 RNA is generally detectable in upper respiratory specimens during the acute phase of  infection. The lowest concentration of SARS-CoV-2 viral copies this assay can detect is 138 copies/mL. A negative result does not preclude SARS-Cov-2 infection and should not be used as the sole basis for treatment or other patient management decisions. A negative result may occur with  improper specimen collection/handling, submission of specimen other than nasopharyngeal swab, presence of viral mutation(s) within the areas targeted by this assay, and inadequate number of viral copies(<138 copies/mL). A negative result must be combined with clinical observations, patient history, and epidemiological information. The expected result is Negative.  Fact Sheet for Patients:  08/13/20  Fact Sheet for Healthcare Providers:  BloggerCourse.com  This test is no t yet approved or cleared by the [employer] FDA and  has been authorized for detection and/or diagnosis of SARS-CoV-2 by FDA under an Emergency Use Authorization (EUA). This EUA will remain  in effect (meaning this test can be used) for the duration of the COVID-19 declaration under Section  564(b)(1) of the Act, 21 U.S.C.section 360bbb-3(b)(1), unless the authorization is terminated  or revoked sooner.       Influenza A by PCR NEGATIVE NEGATIVE Final   Influenza B by PCR NEGATIVE NEGATIVE Final    Comment: (NOTE) The Xpert Xpress SARS-CoV-2/FLU/RSV plus assay is intended as an aid in the diagnosis of influenza from Nasopharyngeal swab specimens and should not be used as a sole basis for treatment. Nasal washings and aspirates are unacceptable for Xpert Xpress SARS-CoV-2/FLU/RSV testing.  Fact Sheet for Patients: EntrepreneurPulse.com.au  Fact Sheet for Healthcare Providers: IncredibleEmployment.be  This test is not yet approved or cleared by the [place] FDA and has been authorized for detection and/or diagnosis of SARS-CoV-2  by FDA under an Emergency Use Authorization (EUA). This EUA will remain in effect (meaning this test can be used) for the duration of the COVID-19 declaration under Section 564(b)(1) of the Act, 21 U.S.C. section 360bbb-3(b)(1), unless the authorization is terminated or revoked.  Performed at [facility], [place] [address] [place], [place] [address]   Resp Panel by RT-PCR (Flu A&B, Covid) Nasopharyngeal Swab     Status: None   Collection Time: 08/14/20 12:35 PM   Specimen: Nasopharyngeal Swab; Nasopharyngeal(NP) swabs in vial transport medium  Result Value Ref Range Status   SARS Coronavirus 2 by RT PCR NEGATIVE NEGATIVE Final    Comment: (NOTE) SARS-CoV-2 target nucleic acids are NOT DETECTED.  The SARS-CoV-2 RNA is generally detectable in upper respiratory specimens during the acute phase of infection. The lowest concentration of SARS-CoV-2 viral copies this assay can detect is 138 copies/mL. A negative result does not preclude SARS-Cov-2 infection and should not be used as the sole basis for treatment or other patient management decisions. A negative result may occur with  improper specimen collection/handling, submission of specimen other than nasopharyngeal swab, presence of viral mutation(s) within the areas targeted by this assay, and inadequate number of viral copies(<138 copies/mL). A negative result must be combined with clinical observations, patient history, and epidemiological information. The expected result is Negative.  Fact Sheet for Patients:  EntrepreneurPulse.com.au  Fact Sheet for Healthcare Providers:  IncredibleEmployment.be  This test is no t yet approved or cleared by the [place] FDA and  has been authorized for detection and/or diagnosis of SARS-CoV-2 by FDA under an Emergency Use Authorization (EUA). This EUA will remain  in effect (meaning this test can be used) for the duration of  the COVID-19 declaration under Section 564(b)(1) of the Act, 21 U.S.C.section 360bbb-3(b)(1), unless the authorization is terminated  or revoked sooner.       Influenza A by PCR NEGATIVE NEGATIVE Final   Influenza B by PCR NEGATIVE NEGATIVE Final    Comment: (NOTE) The Xpert Xpress SARS-CoV-2/FLU/RSV plus assay is intended as an aid in the diagnosis of influenza from Nasopharyngeal swab specimens and should not be used as a sole basis for treatment. Nasal washings and aspirates are unacceptable for Xpert Xpress SARS-CoV-2/FLU/RSV testing.  Fact Sheet for Patients: EntrepreneurPulse.com.au  Fact Sheet for Healthcare Providers: IncredibleEmployment.be  This test is not yet approved or cleared by the [place] FDA and has been authorized for detection and/or diagnosis of SARS-CoV-2 by FDA under an Emergency Use Authorization (EUA). This EUA will remain in effect (meaning this test can be used) for the duration of the COVID-19 declaration under Section 564(b)(1) of the Act, 21 U.S.C. section 360bbb-3(b)(1), unless the authorization is terminated or revoked.  Performed at [facility], [address] [place], [place] [address]      Radiology Studies: No results found. DG Swallowing Func-Speech Pathology  Final Result    NM GI Blood Loss  Final Result    MR BRAIN WO CONTRAST  Final Result    DG Chest 2 View  Final Result    CT Angio Head W or Wo Contrast  Final Result    CT Angio Neck W and/or Wo Contrast  Final Result      Scheduled Meds: .  stroke: mapping our early stages of recovery book   Does not apply Once  . sodium chloride   Intravenous Once  . apixaban  2.5 mg Oral BID  . atorvastatin  20 mg Oral Daily  . cholecalciferol  2,000 Units Oral Daily  . diltiazem  120 mg Oral Daily  . famotidine  20 mg Oral Daily  . fluticasone furoate-vilanterol  1 puff Inhalation Daily  . hydrocortisone  25 mg Rectal BID   . latanoprost  1 drop Both Eyes QHS  . magnesium oxide  200 mg Oral Daily  . memantine  5 mg Oral BID  . pantoprazole  40 mg Oral Daily   PRN Meds: acetaminophen **OR** acetaminophen (TYLENOL) oral liquid 160 mg/5 mL **OR** acetaminophen, albuterol, diphenhydrAMINE, hydrALAZINE, LORazepam, senna-docusate, simethicone Continuous Infusions: . ferumoxytol       LOS: 5 days  Time spent: 9min  Domenic Polite, MD [facility] 08/19/2020, 11:09 AM

## 2020-08-19 NOTE — Progress Notes (Signed)
Physical Therapy Treatment Patient Details Name: Amber Barnett MRN: [id number] DOB: 1934-01-27 Today's Date: 08/19/2020    History of Present Illness Amber Barnett is a 86 y.o. female with medical history significant of diverticulosis with recurrent lower GI bleed, chronic A. 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  Imaging revealed R MCA embolic infarct. Heparin re-started.    PT Comments    Pt remains limited by reports of lightheadedness at this time. Pt reports lightheadedness mid-session after ambulating ~100'. Pt BP stable and HR tachy to 120s. Pt continues to ambulate with reduced gait speed and continues to demonstrate a preference for ambulating close to railing to provide support. PT encourages use of RW initially upon return home to improve stability when ambulating. Pt will continue to benefit from acute PT POC to improve dynamic gait and balance and aide in a complete return to independence.  Follow Up Recommendations  Home health PT;Supervision/Assistance - 24 hour     Equipment Recommendations  None recommended by PT (PT encourages use of RW initially upon return home)    Recommendations for Other Services       Precautions / Restrictions Precautions Precautions: Fall;Other (comment) Precaution Comments: watch HR; GI bleed Restrictions Weight Bearing Restrictions: No    Mobility  Bed Mobility Overal bed mobility: Modified Independent             General bed mobility comments: increased time, HOB elevated  Transfers Overall transfer level: Needs assistance Equipment used: None Transfers: Sit to/from Stand Sit to Stand: Supervision            Ambulation/Gait Ambulation/Gait assistance: Supervision;Min guard Gait Distance (Feet): 200 Feet Assistive device:  (PRN use of railing) Gait  Pattern/deviations: Step-to pattern Gait velocity: reduced Gait velocity interpretation: <1.8 ft/sec, indicate of risk for recurrent falls General Gait Details: pt with slowed step-to gait, despite PT attempts to encourage increased gait speed and step length. Pt preferring to ambulate close to railing for intermittent support   Stairs             Wheelchair Mobility    Modified Rankin (Stroke Patients Only) Modified Rankin (Stroke Patients Only) Pre-Morbid Rankin Score: No significant disability Modified Rankin: Moderately severe disability     Balance Overall balance assessment: Needs assistance Sitting-balance support: No upper extremity supported;Feet supported Sitting balance-Leahy Scale: Good     Standing balance support: No upper extremity supported Standing balance-Leahy Scale: Fair                              Cognition Arousal/Alertness: Awake/alert Behavior During Therapy: WFL for tasks assessed/performed Overall Cognitive Status: Within Functional Limits for tasks assessed                                        Exercises      General Comments General comments (skin integrity, edema, etc.): pt tachy into 120s during session, reports feeling lightheaded during ambulation at this time but BP stable 154/69      Pertinent Vitals/Pain Pain Assessment: No/denies pain    Home Living                      Prior Function  PT Goals (current goals can now be found in the care plan section) Acute Rehab PT Goals Patient Stated Goal: home to her dog, Cream Progress towards PT goals: Progressing toward goals (slowly)    Frequency    Min 4X/week      PT Plan Current plan remains appropriate    Co-evaluation              AM-PAC PT "6 Clicks" Mobility   Outcome Measure  Help needed turning from your back to your side while in a flat bed without using bedrails?: None Help needed moving from lying  on your back to sitting on the side of a flat bed without using bedrails?: None Help needed moving to and from a bed to a chair (including a wheelchair)?: A Little Help needed standing up from a chair using your arms (e.g., wheelchair or bedside chair)?: A Little Help needed to walk in hospital room?: A Little Help needed climbing 3-5 steps with a railing? : A Little 6 Click Score: 20    End of Session   Activity Tolerance: Patient tolerated treatment well Patient left: in bed;with call bell/phone within reach;with bed alarm set Nurse Communication: Mobility status PT Visit Diagnosis: Unsteadiness on feet (R26.81);Muscle weakness (generalized) (M62.81)     Time: [id number] PT Time Calculation (min) (ACUTE ONLY): 16 min  Charges:  $Gait Training: 8-22 mins                     Zenaida Niece, PT, DPT Acute Rehabilitation Pager: [phone]    Zenaida Niece 08/19/2020, 2:24 PM

## 2020-08-19 NOTE — TOC Initial Note (Signed)
Transition of Care [facility]) - Initial/Assessment Note    Patient Details  Name: Amber Barnett MRN: [id number] Date of Birth: 06-27-1934  Transition of Care [facility]) CM/SW Contact:    Amber Friar, RN Phone Number: 08/19/2020, 10:46 AM  Clinical Narrative:                 Pt lives at [facility] in [place]. She states her niece can stay with her a few days after d/c.  Recommendations for [facility] services. CM met with the patient and she had no preference. [facility] arranged and Amber Barnett with Amber Barnett accepted the referral.  Pt has access to all needed DME. Pt has transport home when medically ready.    Expected Discharge Plan: [place] Barriers to Discharge: Continued Medical Work up   Patient Goals and CMS Choice   CMS Medicare.gov Compare Post Acute Care list provided to:: Patient Choice offered to / list presented to : Patient  Expected Discharge Plan and Services Expected Discharge Plan: [place]   Discharge Planning Services: CM Consult Post Acute Care Choice: [place] arrangements for the past 2 months: [place]: PT,OT [place]: [place] Date [place]: 08/19/20   Representative spoke with at [place]: Amber Barnett  Prior Living Arrangements/Services Living arrangements for the past 2 months: [place] Lives with:: Self Patient language and need for interpreter reviewed:: Yes Do you feel safe going back to the place where you live?: Yes      Need for Family Participation in Patient Care: Yes (Comment) Care giver support system in place?: Yes (comment)   Criminal Activity/Legal Involvement Pertinent to Current Situation/Hospitalization: No - Comment as needed  Activities of Daily Living      Permission Sought/Granted                  Emotional Assessment Appearance:: Appears stated  age Attitude/Demeanor/Rapport: Engaged Affect (typically observed): Accepting Orientation: : Oriented to Self,Oriented to Place,Oriented to  Time,Oriented to Situation   Psych Involvement: No (comment)  Admission diagnosis:  CVA (cerebral vascular accident) ([place]) [I63.9] Cerebrovascular accident (CVA), unspecified mechanism ([place]) [I63.9] Patient Active Problem List   Diagnosis Date Noted  . CVA (cerebral vascular accident) ([place]) 08/14/2020  . GIB (gastrointestinal bleeding) 08/11/2020  . Rectal bleeding 12/25/2019  . Current use of long term anticoagulation 05/21/2017  . Cerebral embolism with cerebral infarction ([place]) 05/18/2017  . Essential hypertension 05/08/2017  . Permanent atrial fibrillation ([place]) 05/08/2017   PCP:  Amber Garibaldi, PA-C Pharmacy:   [facility] [id number] - [place], [place] AT [address] [place] [address] Phone: [phone] Fax: [phone]     Social Determinants of Health (SDOH) Interventions    Readmission Risk Interventions No flowsheet data found.

## 2020-08-20 DIAGNOSIS — I4821 Permanent atrial fibrillation: Secondary | ICD-10-CM | POA: Diagnosis not present

## 2020-08-20 DIAGNOSIS — K922 Gastrointestinal hemorrhage, unspecified: Secondary | ICD-10-CM | POA: Diagnosis not present

## 2020-08-20 LAB — BASIC METABOLIC PANEL
Anion gap: 9 (ref 5–15)
BUN: 15 mg/dL (ref 8–23)
CO2: 22 mmol/L (ref 22–32)
Calcium: 8.3 mg/dL — ABNORMAL LOW (ref 8.9–10.3)
Chloride: 107 mmol/L (ref 98–111)
Creatinine, Ser: 1.16 mg/dL — ABNORMAL HIGH (ref 0.44–1.00)
GFR, Estimated: 46 mL/min — ABNORMAL LOW (ref >60)
Glucose, Bld: 92 mg/dL (ref 70–99)
Potassium: 4 mmol/L (ref 3.5–5.1)
Sodium: 138 mmol/L (ref 135–145)

## 2020-08-20 LAB — CBC
HCT: 27.2 % — ABNORMAL LOW (ref 36.0–46.0)
Hemoglobin: 8.5 g/dL — ABNORMAL LOW (ref 12.0–15.0)
MCH: 26.2 pg (ref 26.0–34.0)
MCHC: 31.3 g/dL (ref 30.0–36.0)
MCV: 83.7 fL (ref 80.0–100.0)
Platelets: 214 10*3/uL (ref 150–400)
RBC: 3.25 MIL/uL — ABNORMAL LOW (ref 3.87–5.11)
RDW: 17.4 % — ABNORMAL HIGH (ref 11.5–15.5)
WBC: 8.2 10*3/uL (ref 4.0–10.5)
nRBC: 0 % (ref 0.0–0.2)

## 2020-08-20 MED ORDER — LORAZEPAM 1 MG PO TABS: 0.5000 mg | ORAL_TABLET | Freq: Three times a day (TID) | ORAL | 0 refills | Status: DC | PRN

## 2020-08-20 MED ORDER — LORAZEPAM 0.5 MG PO TABS: 0.5000 mg | ORAL_TABLET | Freq: Three times a day (TID) | ORAL | Status: DC | PRN

## 2020-08-20 MED ORDER — APIXABAN 2.5 MG PO TABS: 2.5000 mg | ORAL_TABLET | Freq: Two times a day (BID) | ORAL | 0 refills | Status: DC

## 2020-08-20 NOTE — Plan of Care (Signed)
Contacted by attending Dr. Jomarie Longs that pt still has low grade hematochezia and her hb still not stable dropping from yesterday 9.4 to today 8.5. she is on eliquis 2.5mg  bid for afib stroke prevention. Agree with Dr. Jomarie Longs that with current active bleeding and dropping Hb, she is not good candidate for anticoagulation at this time. Would recommend to hold off anticoagulation at this time and consider to re-start once GIB and Hb more stabilized. Pt will follow up with Dr. Pearlean Brownie at [facility] in 4 weeks.   Marvel Plan, MD PhD Stroke Neurology 08/20/2020 11:59 AM

## 2020-08-20 NOTE — Progress Notes (Addendum)
PROGRESS NOTE    Bretta Fees   RJJ:[id number]  DOB: 01/19/1934  DOA: 08/14/2020     6  PCP: Doreen Salvage, PA-C  CC: Dysarthria and facial droop  Hospital Course: Mr. San is an 86 yo female with PMH afib (on xarelto), HTN, diverticulosis, CVA (2018 s/p mech embolectomy and revasc), GIB, DVT who presented with severe dysarthria, left facial droop. -She was just hospitalized from 12/22 to 12/24 for hematochezia.  She underwent a bleeding scan which was negative.  Hemoglobin was monitored and remained stable; her Xarelto was held during admission and resumed at discharge. Last colonoscopy was in May 2021 for rectal bleeding which showed internal hemorrhoids and diverticulosis with a negative bleeding scan at that time as well. -She underwent stroke work-up and was found to have a small to moderate sized acute right MCA infarct on MRI brain.  CTA head/neck showed right M2/3 branch occlusion with completed insular and opercular infarction.  A discussion was held on admission regarding anticoagulation considering bleed risk versus stroke risk.  Overall the patient and family elected to remain on anticoagulation and she was placed on a heparin drip.  The the night following admission and into the morning she developed 2 large bloody bowel movements with associated hypotension.  Her heparin drip was then discontinued and she underwent repeat bleeding scan which was also negative.  Hgb continued to downtrend and she was given 1 unit PRBC on 12/27.    Interval History:  -No bleeding yesterday, one  bowel movement with trace streak of blood. -Bowel movement today with maroon blood  Assessment & Plan:  CVA (cerebral vascular accident) (HCC) -Presented to the ED with dysarthria and left facial droop  -Recently hospitalized with lower GI bleed, Xarelto was held on admission 12/22 -12/24 and then resumed at discharge. Unfortunately on 12/25 presented with acute CVA -Followed by  neurology this admission - MRI brain shows "Right M2/3 branch occlusion with completed insular and opercular infarction. Regional M3/4 branch is appear asymmetrically irregular, likely from showering of smaller emboli in this territory." -Was started on IV heparin on admission which was briefly held in the setting of recurrent lower GI bleed, restarted on Eliquis 2.5 mg twice daily 12/28 -Continue statin -PT OT eval completed, recommendations for supervision only and home safety eval  GIB (gastrointestinal bleeding) Acute blood loss anemia -History of diverticular bleed.  Just hospitalized from 12/22 to 12/24 for likely recurrent diverticular bleed, negative bleeding scan.  Discussion was held on admission regarding risks and benefits of ongoing anticoagulation, see discussion above.  -On 12/26 she had recurrent bleeding, heparin was held temporarily on account of this, bleeding scan was negative, she was transfused 1 unit of PRBC on 12/27 -Subsequently bleeding improved, Eliquis restarted 12/28-due to high stroke risk -Continues to have mild persistent hematochezia -Will ask gastroenterology to reassess and discuss anticoagulation with neurology again ADDENDUM: D/w Neurology -in the setting on ongoing hematochezia will hold eliquis  Permanent atrial fibrillation (HCC) -Continue Cardizem and Eliquis, see discussions above  Essential hypertension -Continue Cardizem   DVT prophylaxis: Eliquis from 08/17/2020 Code Status: Intubation but no CPR Family Communication: No family at bedside Disposition Plan: Status is: Inpatient  Remains inpatient appropriate because ongoing lower GI bleed  Dispo: The patient is from: Home              Anticipated d/c is to: Home              Anticipated d/c date is: Unknown, pending resolution  of hematochezia              Patient currently is not medically stable to d/c.   Objective: Blood pressure 129/69, pulse 85, temperature 99.2 F (37.3 C),  temperature source Oral, resp. rate (!) 24, height 5\' 6"  (1.676 m), weight 59 kg, SpO2 94 %.  Examination:  General: Pleasant elderly female sitting up in bed, AAOx3, no distress CVS: S1-S2, regular rate rhythm Lungs: Clear bilaterally Abdomen: Soft, nontender, bowel sounds present Extremities: No edema  Skin: no new rashes Neurologic: No obvious dysarthria or aphasia at this time, motor strength 4+ bilaterally, sensations intact  Consultants:   Neuro   Procedures:     Data Reviewed: I have personally reviewed following labs and imaging studies Results for orders placed or performed during the hospital encounter of 08/14/20 (from the past 24 hour(s))  CBC     Status: Abnormal   Collection Time: 08/19/20  2:22 PM  Result Value Ref Range   WBC 9.7 4.0 - 10.5 K/uL   RBC 3.63 (L) 3.87 - 5.11 MIL/uL   Hemoglobin 9.4 (L) 12.0 - 15.0 g/dL   HCT 29.9 (L) 36.0 - 46.0 %   MCV 82.4 80.0 - 100.0 fL   MCH 25.9 (L) 26.0 - 34.0 pg   MCHC 31.4 30.0 - 36.0 g/dL   RDW 17.2 (H) 11.5 - 15.5 %   Platelets 247 150 - 400 K/uL   nRBC 0.0 0.0 - 0.2 %  CBC     Status: Abnormal   Collection Time: 08/20/20  3:13 AM  Result Value Ref Range   WBC 8.2 4.0 - 10.5 K/uL   RBC 3.25 (L) 3.87 - 5.11 MIL/uL   Hemoglobin 8.5 (L) 12.0 - 15.0 g/dL   HCT 27.2 (L) 36.0 - 46.0 %   MCV 83.7 80.0 - 100.0 fL   MCH 26.2 26.0 - 34.0 pg   MCHC 31.3 30.0 - 36.0 g/dL   RDW 17.4 (H) 11.5 - 15.5 %   Platelets 214 150 - 400 K/uL   nRBC 0.0 0.0 - 0.2 %  Basic metabolic panel     Status: Abnormal   Collection Time: 08/20/20  3:13 AM  Result Value Ref Range   Sodium 138 135 - 145 mmol/L   Potassium 4.0 3.5 - 5.1 mmol/L   Chloride 107 98 - 111 mmol/L   CO2 22 22 - 32 mmol/L   Glucose, Bld 92 70 - 99 mg/dL   BUN 15 8 - 23 mg/dL   Creatinine, Ser 1.16 (H) 0.44 - 1.00 mg/dL   Calcium 8.3 (L) 8.9 - 10.3 mg/dL   GFR, Estimated 46 (L) >60 mL/min   Anion gap 9 5 - 15    Recent Results (from the past 240 hour(s))  Resp  Panel by RT-PCR (Flu A&B, Covid) Nasopharyngeal Swab     Status: None   Collection Time: 08/11/20  9:44 AM   Specimen: Nasopharyngeal Swab; Nasopharyngeal(NP) swabs in vial transport medium  Result Value Ref Range Status   SARS Coronavirus 2 by RT PCR NEGATIVE NEGATIVE Final    Comment: (NOTE) SARS-CoV-2 target nucleic acids are NOT DETECTED.  The SARS-CoV-2 RNA is generally detectable in upper respiratory specimens during the acute phase of infection. The lowest concentration of SARS-CoV-2 viral copies this assay can detect is 138 copies/mL. A negative result does not preclude SARS-Cov-2 infection and should not be used as the sole basis for treatment or other patient management decisions. A negative result may occur with  improper specimen collection/handling, submission of specimen other than nasopharyngeal swab, presence of viral mutation(s) within the areas targeted by this assay, and inadequate number of viral copies(<138 copies/mL). A negative result must be combined with clinical observations, patient history, and epidemiological information. The expected result is Negative.  Fact Sheet for Patients:  EntrepreneurPulse.com.au  Fact Sheet for Healthcare Providers:  IncredibleEmployment.be  This test is no t yet approved or cleared by the [place] FDA and  has been authorized for detection and/or diagnosis of SARS-CoV-2 by FDA under an Emergency Use Authorization (EUA). This EUA will remain  in effect (meaning this test can be used) for the duration of the COVID-19 declaration under Section 564(b)(1) of the Act, 21 U.S.C.section 360bbb-3(b)(1), unless the authorization is terminated  or revoked sooner.       Influenza A by PCR NEGATIVE NEGATIVE Final   Influenza B by PCR NEGATIVE NEGATIVE Final    Comment: (NOTE) The Xpert Xpress SARS-CoV-2/FLU/RSV plus assay is intended as an aid in the diagnosis of influenza from Nasopharyngeal  swab specimens and should not be used as a sole basis for treatment. Nasal washings and aspirates are unacceptable for Xpert Xpress SARS-CoV-2/FLU/RSV testing.  Fact Sheet for Patients: EntrepreneurPulse.com.au  Fact Sheet for Healthcare Providers: IncredibleEmployment.be  This test is not yet approved or cleared by the [place] FDA and has been authorized for detection and/or diagnosis of SARS-CoV-2 by FDA under an Emergency Use Authorization (EUA). This EUA will remain in effect (meaning this test can be used) for the duration of the COVID-19 declaration under Section 564(b)(1) of the Act, 21 U.S.C. section 360bbb-3(b)(1), unless the authorization is terminated or revoked.  Performed at [facility], [place] [address], [place], [place] [address]   Resp Panel by RT-PCR (Flu A&B, Covid) Nasopharyngeal Swab     Status: None   Collection Time: 08/14/20 12:35 PM   Specimen: Nasopharyngeal Swab; Nasopharyngeal(NP) swabs in vial transport medium  Result Value Ref Range Status   SARS Coronavirus 2 by RT PCR NEGATIVE NEGATIVE Final    Comment: (NOTE) SARS-CoV-2 target nucleic acids are NOT DETECTED.  The SARS-CoV-2 RNA is generally detectable in upper respiratory specimens during the acute phase of infection. The lowest concentration of SARS-CoV-2 viral copies this assay can detect is 138 copies/mL. A negative result does not preclude SARS-Cov-2 infection and should not be used as the sole basis for treatment or other patient management decisions. A negative result may occur with  improper specimen collection/handling, submission of specimen other than nasopharyngeal swab, presence of viral mutation(s) within the areas targeted by this assay, and inadequate number of viral copies(<138 copies/mL). A negative result must be combined with clinical observations, patient history, and epidemiological information. The expected result  is Negative.  Fact Sheet for Patients:  EntrepreneurPulse.com.au  Fact Sheet for Healthcare Providers:  IncredibleEmployment.be  This test is no t yet approved or cleared by the [place] FDA and  has been authorized for detection and/or diagnosis of SARS-CoV-2 by FDA under an Emergency Use Authorization (EUA). This EUA will remain  in effect (meaning this test can be used) for the duration of the COVID-19 declaration under Section 564(b)(1) of the Act, 21 U.S.C.section 360bbb-3(b)(1), unless the authorization is terminated  or revoked sooner.       Influenza A by PCR NEGATIVE NEGATIVE Final   Influenza B by PCR NEGATIVE NEGATIVE Final    Comment: (NOTE) The Xpert Xpress SARS-CoV-2/FLU/RSV plus assay is intended as an aid in the  diagnosis of influenza from Nasopharyngeal swab specimens and should not be used as a sole basis for treatment. Nasal washings and aspirates are unacceptable for Xpert Xpress SARS-CoV-2/FLU/RSV testing.  Fact Sheet for Patients: EntrepreneurPulse.com.au  Fact Sheet for Healthcare Providers: IncredibleEmployment.be  This test is not yet approved or cleared by the [place] FDA and has been authorized for detection and/or diagnosis of SARS-CoV-2 by FDA under an Emergency Use Authorization (EUA). This EUA will remain in effect (meaning this test can be used) for the duration of the COVID-19 declaration under Section 564(b)(1) of the Act, 21 U.S.C. section 360bbb-3(b)(1), unless the authorization is terminated or revoked.  Performed at [facility], [place] [address] [place], [place] [address]      Radiology Studies: No results found. DG Swallowing Func-Speech Pathology  Final Result    NM GI Blood Loss  Final Result    MR BRAIN WO CONTRAST  Final Result    DG Chest 2 View  Final Result    CT Angio Head W or Wo Contrast  Final Result    CT Angio Neck W  and/or Wo Contrast  Final Result      Scheduled Meds: .  stroke: mapping our early stages of recovery book   Does not apply Once  . sodium chloride   Intravenous Once  . apixaban  2.5 mg Oral BID  . atorvastatin  20 mg Oral Daily  . cholecalciferol  2,000 Units Oral Daily  . diltiazem  120 mg Oral Daily  . famotidine  20 mg Oral Daily  . fluticasone furoate-vilanterol  1 puff Inhalation Daily  . hydrocortisone  25 mg Rectal BID  . latanoprost  1 drop Both Eyes QHS  . magnesium oxide  200 mg Oral Daily  . memantine  5 mg Oral BID  . pantoprazole  40 mg Oral Daily   PRN Meds: acetaminophen **OR** acetaminophen (TYLENOL) oral liquid 160 mg/5 mL **OR** acetaminophen, albuterol, diphenhydrAMINE, hydrALAZINE, LORazepam, senna-docusate, simethicone Continuous Infusions:    LOS: 6 days  Time spent: 95min  Domenic Polite, MD [facility] 08/20/2020, 11:23 AM

## 2020-08-20 NOTE — Progress Notes (Signed)
[facility] Gastroenterology Progress Note  Amber Barnett 86 y.o. 10/26/1933   Subjective: Rectal bleeding this morning. Feels ok. Denies abdominal pain/N/V. Family at bedside.  Objective: Vital signs: Vitals:   08/20/20 0916 08/20/20 1243  BP:  105/68  Pulse:  (!) 105  Resp:  20  Temp:  99.1 F (37.3 C)  SpO2: 94% 94%    Physical Exam: Gen: lethargic, elderly, no acute distress, pleasant HEENT: anicteric sclera CV: RRR Chest: CTA B Abd: soft, nontender, nondistended, +BS Ext: no edema  Lab Results: Recent Labs    08/18/20 0247 08/19/20 0257 08/20/20 0313  NA 140 138 138  K 3.6 3.8 4.0  CL 109 106 107  CO2 22 24 22   GLUCOSE 99 95 92  BUN 7* 12 15  CREATININE 1.03* 1.10* 1.16*  CALCIUM 8.5* 8.4* 8.3*  MG 1.8  --   --    No results for input(s): AST, ALT, ALKPHOS, BILITOT, PROT, ALBUMIN in the last 72 hours. Recent Labs    08/19/20 1422 08/20/20 0313  WBC 9.7 8.2  HGB 9.4* 8.5*  HCT 29.9* 27.2*  MCV 82.4 83.7  PLT 247 214      Assessment/Plan: Hematochezia on Eliquis and drop in Hgb. Colonoscopy in May 2021 showed diffuse diverticulosis and hemorrhoids. Bleeding likely mucosal source and Eliquis on hold for now and seen by neurology. Would manage conservatively and think a repeat colonoscopy would be very low yield therefore no plans for repeat colonoscopy at this time. Follow H/Hs. If bleeding significantly worsens, then would do bleeding scan as next step. D/W family. Will follow.   June 2021 08/20/2020, 4:11 PM  Questions please call [phone] ID: [id number], female   DOB: 1933-12-11, 86 y.o.   MRN: [id number]

## 2020-08-21 DIAGNOSIS — I4821 Permanent atrial fibrillation: Secondary | ICD-10-CM | POA: Diagnosis not present

## 2020-08-21 DIAGNOSIS — K922 Gastrointestinal hemorrhage, unspecified: Secondary | ICD-10-CM | POA: Diagnosis not present

## 2020-08-21 LAB — CBC
HCT: 26.4 % — ABNORMAL LOW (ref 36.0–46.0)
Hemoglobin: 8.4 g/dL — ABNORMAL LOW (ref 12.0–15.0)
MCH: 26.4 pg (ref 26.0–34.0)
MCHC: 31.8 g/dL (ref 30.0–36.0)
MCV: 83 fL (ref 80.0–100.0)
Platelets: 242 10*3/uL (ref 150–400)
RBC: 3.18 MIL/uL — ABNORMAL LOW (ref 3.87–5.11)
RDW: 17.4 % — ABNORMAL HIGH (ref 11.5–15.5)
WBC: 8.7 10*3/uL (ref 4.0–10.5)
nRBC: 0.2 % (ref 0.0–0.2)

## 2020-08-21 LAB — BASIC METABOLIC PANEL
Anion gap: 9 (ref 5–15)
BUN: 15 mg/dL (ref 8–23)
CO2: 23 mmol/L (ref 22–32)
Calcium: 8.6 mg/dL — ABNORMAL LOW (ref 8.9–10.3)
Chloride: 107 mmol/L (ref 98–111)
Creatinine, Ser: 1.13 mg/dL — ABNORMAL HIGH (ref 0.44–1.00)
GFR, Estimated: 48 mL/min — ABNORMAL LOW (ref >60)
Glucose, Bld: 90 mg/dL (ref 70–99)
Potassium: 3.9 mmol/L (ref 3.5–5.1)
Sodium: 139 mmol/L (ref 135–145)

## 2020-08-21 NOTE — Progress Notes (Signed)
[facility] Gastroenterology Progress Note  Amber Barnett 87 y.o. [birth year]   Subjective: Manson Passey stools this morning. Denies abdominal pain. Feels ok.  Objective: Vital signs: Vitals:   08/21/20 1116 08/21/20 1227  BP:  116/66  Pulse: 98 85  Resp: 18 20  Temp: 98 F (36.7 C) 98 F (36.7 C)  SpO2: 98% 100%    Physical Exam: Gen: alert, no acute distress, thin, elderly HEENT: anicteric sclera CV: RRR Chest: CTA B Abd: soft, nontender, nondistended, +BS Ext: no edema  Lab Results: Recent Labs    08/20/20 0313 08/21/20 0149  NA 138 139  K 4.0 3.9  CL 107 107  CO2 22 23  GLUCOSE 92 90  BUN 15 15  CREATININE 1.16* 1.13*  CALCIUM 8.3* 8.6*   No results for input(s): AST, ALT, ALKPHOS, BILITOT, PROT, ALBUMIN in the last 72 hours. Recent Labs    08/20/20 0313 08/21/20 0149  WBC 8.2 8.7  HGB 8.5* 8.4*  HCT 27.2* 26.4*  MCV 83.7 83.0  PLT 214 242      Assessment/Plan: Hematochezia seems to have resolved with brown stools and stable Hgb with anticoagulation on hold. Ok to resume anticoagulation when deemed necessary by neurology/primary team. Manage GI bleed conservatively and it appears to have resolved. Will sign off. Call if questions.   Shirley Friar 08/21/2020, 2:32 PM  Questions please call [phone] ID: Amber Barnett, female   DOB: [birth year], 87 y.o.   MRN: [id number]

## 2020-08-21 NOTE — Progress Notes (Signed)
Occupational Therapy Treatment Patient Details Name: Amber Barnett MRN: [id number] DOB: 26-Apr-1934 Today's Date: 08/21/2020    History of present illness Amber Barnett is a 87 y.o. female with medical history significant of diverticulosis with recurrent lower GI bleed, chronic A. 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  Imaging revealed R MCA embolic infarct. Heparin re-started.   OT comments  Pt making steady progress towards OT goals this session. Pt seated in recliner upon arrival agreeable to OT intervention. Session focus on functional mobility with a focus on dynamic balance during IADL tasks. Pt able to ambulate in room to retrieve cup of water on bed side table and return to recliner with no spillage, noted slowed pace during ambulation when returning with cup of water. Pt reports dizziness with initial bout of functional mobility needing min guard assist- MIN A for balance with pt preferring to hold onto handrail during mobility. Pt took seated rest break with reports of dizziness resolving with increased seated rest break. pt remains tachy during sessions with HR ranging from 120- 132 bpm with mobility, BP taken from recliner prior to mobility 146/67, BP after ambulation from recliner 84/70 however pt no longer reporting feeling dizzy, feel BP may have been faulty reading as pt with no reports of dizziness, BP taken again from recliner ~ 1 min later 131/5. DC plan remains appropriate, will follow acutely per POC.    Follow Up Recommendations  Other (comment) (home safety evaluation)    Equipment Recommendations  None recommended by OT    Recommendations for Other Services      Precautions / Restrictions Precautions Precautions: Fall;Other (comment) Precaution Comments: watch HR; GI bleed Restrictions Weight Bearing Restrictions: No        Mobility Bed Mobility               General bed mobility comments: OOb in recliner  Transfers Overall transfer level: Needs assistance Equipment used: None Transfers: Sit to/from Stand Sit to Stand: Supervision;Min guard         General transfer comment: gross supervision with pt complete x4 sit<>stands from recliner and chair in hallway, close superivison increasing to min guard at times with reports of dizziness    Balance Overall balance assessment: Needs assistance Sitting-balance support: No upper extremity supported;Feet supported Sitting balance-Leahy Scale: Good     Standing balance support: No upper extremity supported Standing balance-Leahy Scale: Fair                             ADL either performed or assessed with clinical judgement   ADL Overall ADL's : Needs assistance/impaired                         Toilet Transfer: Ambulation;Min guard;Minimal assistance Statistician Details (indicate cue type and reason): simulated via functional mobility with Rw and min guard- MIN A with no AD however pt noted to prefer to hold onto rail during mobility needing up to MIN A at times d/t reports of dizziness         Functional mobility during ADLs: Minimal assistance;Min guard General ADL Comments: continued work on dynamic balance challenges in relation to IADL tasks, pt able to ambulate in room to retrieve cup of water from side table and return to recliner with no spillage however noticed  pts pace decreased while completing functional task. Pt with reports of dizziness during mobility needing seated rest break.     Vision       Perception     Praxis      Cognition Arousal/Alertness: Awake/alert Behavior During Therapy: WFL for tasks assessed/performed Overall Cognitive Status: Within Functional Limits for tasks assessed                                 General Comments: very tearful during session, pt  able to recall 3/3 words stated in beginning of session at end of session        Exercises     Shoulder Instructions       General Comments pt remains tachy during sessions with HR ranging from 120- 132 bpm with mobility, BP taken from recliner prior to mobility 146/67, BP after ambulation from recliner 84/70 however pt no longer reporting feeling dizzy, BP taken again from recliner ~ 1 min later 131/51    Pertinent Vitals/ Pain       Pain Assessment: No/denies pain  Home Living                                          Prior Functioning/Environment              Frequency  Min 2X/week        Progress Toward Goals  OT Goals(current goals can now be found in the care plan section)  Progress towards OT goals: Progressing toward goals  Acute Rehab OT Goals Patient Stated Goal: home to her dog, Cream OT Goal Formulation: With patient Time For Goal Achievement: 08/31/20 Potential to Achieve Goals: Good  Plan Discharge plan remains appropriate;Frequency remains appropriate    Co-evaluation                 AM-PAC OT "6 Clicks" Daily Activity     Outcome Measure   Help from another person eating meals?: None Help from another person taking care of personal grooming?: None Help from another person toileting, which includes using toliet, bedpan, or urinal?: A Little Help from another person bathing (including washing, rinsing, drying)?: A Little Help from another person to put on and taking off regular upper body clothing?: None Help from another person to put on and taking off regular lower body clothing?: None 6 Click Score: 22    End of Session Equipment Utilized During Treatment: Gait belt  OT Visit Diagnosis: Unsteadiness on feet (R26.81);Muscle weakness (generalized) (M62.81)   Activity Tolerance Patient tolerated treatment well   Patient Left in chair;with call bell/phone within reach;with chair alarm set   Nurse Communication  Mobility status;Other (comment) (drop in BP however feel faulty reading as pt asymptomatic)        Time: [id number] OT Time Calculation (min): 30 min  Charges: OT General Charges $OT Visit: 1 Visit OT Treatments $Self Care/Home Management : 23-37 mins  Amber Barnett., COTA/L Acute Rehabilitation Services [phone]    Amber Barnett 08/21/2020, 12:30 PM

## 2020-08-21 NOTE — Progress Notes (Signed)
PROGRESS NOTE    Amber Barnett   [id number]  DOB: 01/31/1934  DOA: 08/14/2020     7  PCP: Egbert Garibaldi, PA-C  CC: Dysarthria and facial droop  Hospital Course: Mr. Cutrona is an 87 yo female with PMH afib (on xarelto), HTN, diverticulosis, CVA (2018 s/p mech embolectomy and revasc), GIB, DVT who presented with severe dysarthria, left facial droop on 12/25. -She was just hospitalized from 12/22 to 12/24 for hematochezia.  She underwent a bleeding scan which was negative.  Hemoglobin was monitored and remained stable; her Xarelto was held during admission (3days) and resumed at discharge. Last colonoscopy was in May 2021 for rectal bleeding which showed internal hemorrhoids and diverticulosis with a negative bleeding scan at that time as well. -She underwent stroke work-up and was found to have a small to moderate sized acute right MCA infarct on MRI brain.  CTA head/neck showed right M2/3 branch occlusion with completed insular and opercular infarction. -After discussion of risks/benefits with anticoagulation patient and family elected to continue anticoagulation, she was placed on a heparin drip initially subsequently developed 2 large bloody bowel movements, heparin drip was discontinued, she had a bleeding scan which is negative and transfused 1 unit of PRBC on 12/27 -Since then bleeding had improved and was started on Eliquis 12/28 -Subsequently overall improving however continued to have low-grade hematochezia on Eliquis -On 12/31 had stool with more blood, neurology reconsulted, agreed with holding anticoagulation in the setting despite recent stroke, GI reconsulted, conservative management recommended    Interval History:  -Bowel movement overnight with scant blood only,  none so far this morning  Assessment & Plan:  CVA (cerebral vascular accident) [facility]) -Presented to the ED with dysarthria and left facial droop  -Recently hospitalized with lower GI bleed,  Xarelto was held on admission 12/22 -12/24 and then resumed at discharge. Unfortunately on 12/25 presented with acute CVA -Followed by neurology this admission - MRI brain shows "Right M2/3 branch occlusion with completed insular and opercular infarction. Regional M3/4 branch is appear asymmetrically irregular, likely from showering of smaller emboli in this territory." -Was started on IV heparin on admission which was briefly held in the setting of recurrent lower GI bleed, restarted on Eliquis 2.5 mg twice daily 12/28 -Due to recurrent bleeding Eliquis held again on 12/31 after discussion with neurology, plan to restart this once bleeding has resolved -Continue statin -PT OT eval completed, recommendations for supervision only and home safety eval  GIB (gastrointestinal bleeding) Acute blood loss anemia -History of diverticular bleed.  Just hospitalized from 12/22 to 12/24 for likely recurrent diverticular bleed, negative bleeding scan.   -Due to concurrent embolic stroke she was started on heparin on admission, the following day developed recurrent bleeding, heparin was held, repeat bleeding scan was negative, transfused 1 unit of PRBC on 12/27 -Subsequently bleeding improved, Eliquis started 12/28-due to high stroke risk -Continues to have mild persistent hematochezia -Appreciate gastroenterology input, held Eliquis yesterday, see discussion above  Permanent atrial fibrillation ([place]) -Continue Cardizem, Eliquis on hold, see discussions above  Essential hypertension -Continue Cardizem   DVT prophylaxis: Eliquis from 08/17/2020 Code Status: Intubation but no CPR Family Communication: Discussed with niece 12/30 Disposition Plan: Status is: Inpatient  Remains inpatient appropriate because ongoing lower GI bleed  Dispo: The patient is from: Home              Anticipated d/c is to: Home              Anticipated  d/c date is: Unknown, pending resolution of hematochezia               Patient currently is not medically stable to d/c.    Objective: Blood pressure (!) (P) 122/93, pulse (P) 70, temperature (P) 98.4 F (36.9 C), temperature source (P) Oral, resp. rate (P) 20, height 5\' 6"  (1.676 m), weight 59 kg, SpO2 (P) 99 %.  Examination:  General: Pleasant elderly female sitting up in bed, AAOx3, no distress CVS: S I-S II, regular rate rhythm Lungs: Clear bilaterally Abdomen: Soft, nontender, bowel sounds present Extremities: No edema Skin: No rashes on exposed skin  Neurologic: No obvious dysarthria or aphasia at this time, motor strength 4+ bilaterally, sensations intact  Consultants:   Neuro   [facility]  Procedures:   Bleeding scan  Data Reviewed: I have personally reviewed following labs and imaging studies Results for orders placed or performed during the hospital encounter of 08/14/20 (from the past 24 hour(s))  CBC     Status: Abnormal   Collection Time: 08/21/20  1:49 AM  Result Value Ref Range   WBC 8.7 4.0 - 10.5 K/uL   RBC 3.18 (L) 3.87 - 5.11 MIL/uL   Hemoglobin 8.4 (L) 12.0 - 15.0 g/dL   HCT 10/19/20 (L) 16.6 - 06.3 %   MCV 83.0 80.0 - 100.0 fL   MCH 26.4 26.0 - 34.0 pg   MCHC 31.8 30.0 - 36.0 g/dL   RDW 01.6 (H) 01.0 - 93.2 %   Platelets 242 150 - 400 K/uL   nRBC 0.2 0.0 - 0.2 %  Basic metabolic panel     Status: Abnormal   Collection Time: 08/21/20  1:49 AM  Result Value Ref Range   Sodium 139 135 - 145 mmol/L   Potassium 3.9 3.5 - 5.1 mmol/L   Chloride 107 98 - 111 mmol/L   CO2 23 22 - 32 mmol/L   Glucose, Bld 90 70 - 99 mg/dL   BUN 15 8 - 23 mg/dL   Creatinine, Ser 10/19/20 (H) 0.44 - 1.00 mg/dL   Calcium 8.6 (L) 8.9 - 10.3 mg/dL   GFR, Estimated 48 (L) >60 mL/min   Anion gap 9 5 - 15    Recent Results (from the past 240 hour(s))  Resp Panel by RT-PCR (Flu A&B, Covid) Nasopharyngeal Swab     Status: None   Collection Time: 08/14/20 12:35 PM   Specimen: Nasopharyngeal Swab; Nasopharyngeal(NP) swabs in vial transport  medium  Result Value Ref Range Status   SARS Coronavirus 2 by RT PCR NEGATIVE NEGATIVE Final    Comment: (NOTE) SARS-CoV-2 target nucleic acids are NOT DETECTED.  The SARS-CoV-2 RNA is generally detectable in upper respiratory specimens during the acute phase of infection. The lowest concentration of SARS-CoV-2 viral copies this assay can detect is 138 copies/mL. A negative result does not preclude SARS-Cov-2 infection and should not be used as the sole basis for treatment or other patient management decisions. A negative result may occur with  improper specimen collection/handling, submission of specimen other than nasopharyngeal swab, presence of viral mutation(s) within the areas targeted by this assay, and inadequate number of viral copies(<138 copies/mL). A negative result must be combined with clinical observations, patient history, and epidemiological information. The expected result is Negative.  Fact Sheet for Patients:  08/16/20  Fact Sheet for Healthcare Providers:  BloggerCourse.com  This test is no t yet approved or cleared by the [employer] FDA and  has been authorized for detection and/or  diagnosis of SARS-CoV-2 by FDA under an Emergency Use Authorization (EUA). This EUA will remain  in effect (meaning this test can be used) for the duration of the COVID-19 declaration under Section 564(b)(1) of the Act, 21 U.S.C.section 360bbb-3(b)(1), unless the authorization is terminated  or revoked sooner.       Influenza A by PCR NEGATIVE NEGATIVE Final   Influenza B by PCR NEGATIVE NEGATIVE Final    Comment: (NOTE) The Xpert Xpress SARS-CoV-2/FLU/RSV plus assay is intended as an aid in the diagnosis of influenza from Nasopharyngeal swab specimens and should not be used as a sole basis for treatment. Nasal washings and aspirates are unacceptable for Xpert Xpress SARS-CoV-2/FLU/RSV testing.  Fact Sheet for  Patients: EntrepreneurPulse.com.au  Fact Sheet for Healthcare Providers: IncredibleEmployment.be  This test is not yet approved or cleared by the [place] FDA and has been authorized for detection and/or diagnosis of SARS-CoV-2 by FDA under an Emergency Use Authorization (EUA). This EUA will remain in effect (meaning this test can be used) for the duration of the COVID-19 declaration under Section 564(b)(1) of the Act, 21 U.S.C. section 360bbb-3(b)(1), unless the authorization is terminated or revoked.  Performed at [facility], [place] [address] [place], [place] [address]      Radiology Studies: No results found. DG Swallowing Func-Speech Pathology  Final Result    NM GI Blood Loss  Final Result    MR BRAIN WO CONTRAST  Final Result    DG Chest 2 View  Final Result    CT Angio Head W or Wo Contrast  Final Result    CT Angio Neck W and/or Wo Contrast  Final Result      Scheduled Meds: .  stroke: mapping our early stages of recovery book   Does not apply Once  . sodium chloride   Intravenous Once  . atorvastatin  20 mg Oral Daily  . cholecalciferol  2,000 Units Oral Daily  . diltiazem  120 mg Oral Daily  . famotidine  20 mg Oral Daily  . fluticasone furoate-vilanterol  1 puff Inhalation Daily  . hydrocortisone  25 mg Rectal BID  . latanoprost  1 drop Both Eyes QHS  . magnesium oxide  200 mg Oral Daily  . memantine  5 mg Oral BID  . pantoprazole  40 mg Oral Daily   PRN Meds: acetaminophen **OR** acetaminophen (TYLENOL) oral liquid 160 mg/5 mL **OR** acetaminophen, albuterol, diphenhydrAMINE, hydrALAZINE, LORazepam, senna-docusate, simethicone Continuous Infusions:    LOS: 7 days  Time spent: 38min  Domenic Polite, MD [facility] 08/21/2020, 10:58 AM

## 2020-08-21 NOTE — Progress Notes (Signed)
PT Cancellation Note  Patient Details Name: Amber Barnett MRN: [id number] DOB: 08-16-1934   Cancelled Treatment:    Reason Eval/Treat Not Completed: Patient declined, no reason specified. Pt declines mobility at this time, reporting she walked some this morning which resulted in nausea and dizziness. Pt reports not wanting to feel bad again. PT raises concerns over the pt having dizziness frequently with mobility, with the need to mobilize to assess if dizziness will continue when mobilizing as the pt may discharge soon. Pt continues to decline mobilizing at this time despite PT encouragement.   [name] 08/21/2020, 4:10 PM

## 2020-08-21 NOTE — Plan of Care (Signed)
  Problem: Education: Goal: Knowledge of General Education information will improve Description: Including pain rating scale, medication(s)/side effects and non-pharmacologic comfort measures Outcome: Progressing   Problem: Health Behavior/Discharge Planning: Goal: Ability to manage health-related needs will improve Outcome: Progressing   Problem: Clinical Measurements: Goal: Ability to maintain clinical measurements within normal limits will improve Outcome: Progressing Goal: Will remain free from infection Outcome: Progressing Goal: Diagnostic test results will improve Outcome: Progressing Goal: Respiratory complications will improve Outcome: Progressing Goal: Cardiovascular complication will be avoided Outcome: Progressing   Problem: Activity: Goal: Risk for activity intolerance will decrease Outcome: Progressing   Problem: Nutrition: Goal: Adequate nutrition will be maintained Outcome: Progressing   Problem: Coping: Goal: Level of anxiety will decrease Outcome: Progressing   Problem: Elimination: Goal: Will not experience complications related to bowel motility Outcome: Progressing Goal: Will not experience complications related to urinary retention Outcome: Progressing   Problem: Pain Managment: Goal: General experience of comfort will improve Outcome: Progressing   Problem: Safety: Goal: Ability to remain free from injury will improve Outcome: Progressing   Problem: Skin Integrity: Goal: Risk for impaired skin integrity will decrease Outcome: Progressing   Problem: Education: Goal: Knowledge of disease or condition will improve Outcome: Progressing Goal: Knowledge of secondary prevention will improve Outcome: Progressing Goal: Knowledge of patient specific risk factors addressed and post discharge goals established will improve Outcome: Progressing Goal: Individualized Educational Video(s) Outcome: Progressing   Problem: Coping: Goal: Will verbalize  positive feelings about self Outcome: Progressing Goal: Will identify appropriate support needs Outcome: Progressing   Problem: Health Behavior/Discharge Planning: Goal: Ability to manage health-related needs will improve Outcome: Progressing   Problem: Self-Care: Goal: Ability to participate in self-care as condition permits will improve Outcome: Progressing Goal: Verbalization of feelings and concerns over difficulty with self-care will improve Outcome: Progressing Goal: Ability to communicate needs accurately will improve Outcome: Progressing   Problem: Nutrition: Goal: Risk of aspiration will decrease Outcome: Progressing   

## 2020-08-22 DIAGNOSIS — I4821 Permanent atrial fibrillation: Secondary | ICD-10-CM | POA: Diagnosis not present

## 2020-08-22 DIAGNOSIS — K922 Gastrointestinal hemorrhage, unspecified: Secondary | ICD-10-CM | POA: Diagnosis not present

## 2020-08-22 DIAGNOSIS — I10 Essential (primary) hypertension: Secondary | ICD-10-CM | POA: Diagnosis not present

## 2020-08-22 LAB — BASIC METABOLIC PANEL
Anion gap: 9 (ref 5–15)
BUN: 14 mg/dL (ref 8–23)
CO2: 23 mmol/L (ref 22–32)
Calcium: 8.4 mg/dL — ABNORMAL LOW (ref 8.9–10.3)
Chloride: 107 mmol/L (ref 98–111)
Creatinine, Ser: 1.17 mg/dL — ABNORMAL HIGH (ref 0.44–1.00)
GFR, Estimated: 46 mL/min — ABNORMAL LOW (ref >60)
Glucose, Bld: 93 mg/dL (ref 70–99)
Potassium: 4.2 mmol/L (ref 3.5–5.1)
Sodium: 139 mmol/L (ref 135–145)

## 2020-08-22 LAB — CBC
HCT: 25.2 % — ABNORMAL LOW (ref 36.0–46.0)
Hemoglobin: 7.8 g/dL — ABNORMAL LOW (ref 12.0–15.0)
MCH: 26.1 pg (ref 26.0–34.0)
MCHC: 31 g/dL (ref 30.0–36.0)
MCV: 84.3 fL (ref 80.0–100.0)
Platelets: 251 10*3/uL (ref 150–400)
RBC: 2.99 MIL/uL — ABNORMAL LOW (ref 3.87–5.11)
RDW: 17.6 % — ABNORMAL HIGH (ref 11.5–15.5)
WBC: 7.9 10*3/uL (ref 4.0–10.5)
nRBC: 0.4 % — ABNORMAL HIGH (ref 0.0–0.2)

## 2020-08-22 MED ORDER — FERROUS SULFATE 325 (65 FE) MG PO TABS
325.0000 mg | ORAL_TABLET | Freq: Two times a day (BID) | ORAL | Status: DC
  Administered 2020-08-22 – 2020-08-23 (×4): 325 mg via ORAL
  Filled 2020-08-22 (×4): qty 1

## 2020-08-22 MED ORDER — FERROUS SULFATE 325 (65 FE) MG PO TABS: 325.0000 mg | ORAL_TABLET | Freq: Two times a day (BID) | ORAL | 3 refills | Status: DC

## 2020-08-22 NOTE — Progress Notes (Signed)
Patient was discharged after discussing with her regarding holding anticoagulation as advised by neurologist. GI defer that decision to neurologist and because of her dropping hemoglobin neurologist decided to hold anticoagulation until they will see her as an outpatient with the next couple of weeks.  Patient's niece is her caretaker and she refused to take her back home without anticoagulation, stating that you are sending her home to die with stroke. She wants her primary care provider to make decision regarding anticoagulation.  I tried explaining the risk and benefits and told that her hemoglobin is 7.8 this morning, she started saying that she will not take her back unless her hemoglobin is normal at 11.  I spent a long time explaining but it was completely fruitless.  She continued to accuse me although explained multiple times that today is my first day of seeing her and that is the recommendations from her gastroenterologist and neurologist.  Patient is medically stable for discharge.  Also discussed with Dr. Pearlean Brownie from neurology.

## 2020-08-22 NOTE — Plan of Care (Signed)
  Problem: Clinical Measurements: Goal: Will remain free from infection Outcome: Progressing   Problem: Clinical Measurements: Goal: Ability to maintain clinical measurements within normal limits will improve Outcome: Progressing Goal: Will remain free from infection Outcome: Progressing   Problem: Health Behavior/Discharge Planning: Goal: Ability to manage health-related needs will improve Outcome: Progressing   Problem: Education: Goal: Knowledge of General Education information will improve Description: Including pain rating scale, medication(s)/side effects and non-pharmacologic comfort measures Outcome: Progressing   

## 2020-08-22 NOTE — Progress Notes (Signed)
Pt was prepared for discharge. Niece was contacted and refused the discharge for home. Niece does not agree with MD decision about the anticoagulation medications and does not feel comfortable with pt going home. MD is was notified of the refusal. RN will continue to monitor.

## 2020-08-22 NOTE — Discharge Summary (Addendum)
Physician Discharge Summary  Amber Barnett [id number] DOB: [birth year] DOA: 08/14/2020  PCP: Egbert Garibaldi, PA-C  Admit date: 08/14/2020 Discharge date: 08/23/2020  Admitted From: ALF Disposition: ALF  Recommendations for Outpatient Follow-up:  1. Follow up with PCP in 1-2 weeks 2. Follow-up with neurology 3. Please obtain BMP/CBC in one week 4. Please follow up on the following pending results: None  Home Health: Yes Equipment/Devices: None Discharge Condition: Stable CODE STATUS: Partial, can intubate but no CPR. Diet recommendation: Heart Healthy   Brief/Interim Summary: Mr. Malach is an 87 yo female with PMH afib (on xarelto), HTN, diverticulosis, CVA (2018 s/p mech embolectomy and revasc), GIB, DVT who presented with severe dysarthria, left facial droop on 12/25. -She was just hospitalized from 12/22 to 12/24 for hematochezia.  She underwent a bleeding scan which was negative.  Hemoglobin was monitored and remained stable; her Xarelto was held during admission (3days) and resumed at discharge. Last colonoscopy was in May 2021 for rectal bleeding which showed internal hemorrhoids and diverticulosis with a negative bleeding scan at that time as well. -She underwent stroke work-up and was found to have a small to moderate sized acute right MCA infarct on MRI brain.  CTA head/neck showed right M2/3 branch occlusion with completed insular and opercular infarction. -After discussion of risks/benefits with anticoagulation patient and family elected to continue anticoagulation, she was placed on a heparin drip initially subsequently developed 2 large bloody bowel movements, heparin drip was discontinued, she had a bleeding scan which is negative and transfused 2 unit of PRBC on 12/27 and the second on 08/23/2020 before discharge. -Since then bleeding had improved and was started on Eliquis 12/28 -Subsequently overall improving however continued to have low-grade hematochezia  on Eliquis -On 12/31 had stool with more blood, neurology reconsulted, agreed with holding anticoagulation in the setting despite recent stroke, GI reconsulted, conservative management recommended.  Patient has an history of permanent atrial fibrillation she will continue with her home dose of Cardizem and anticoagulation after a long discussion about risk and benefit.  She is currently high risk for bleeding but want to continue with anticoagulation as she is afraid of stroke.  Initial plan was to hold anticoagulation until she will see her neurologist as an outpatient.  But she was restarted on her Xarelto according to her and niece's wishes. We explained extensively about the risk and benefit of anticoagulation and she is willing to take the risk at this time.  Her home dose of amlodipine was also held as blood pressure was well controlled only with Cardizem, her PCP can restart it if needed.  She will continue rest of her home medication and follow-up with her providers.  Discharge Diagnoses:  Principal Problem:   CVA (cerebral vascular accident) [facility]) Active Problems:   Essential hypertension   Permanent atrial fibrillation ([place])   GIB (gastrointestinal bleeding)   Discharge Instructions  Discharge Instructions    Ambulatory referral to Neurology   Complete by: As directed    Follow up with Dr. Leonie Man at [facility] in 4-6 weeks. Too complicated for RN to follow. Thanks.   Diet - low sodium heart healthy   Complete by: As directed    Discharge instructions   Complete by: As directed    It was pleasure taking care of you. We are holding your Eliquis due to bleeding issues, keep holding it till you will see your neurologist as an outpatient and they can restart if you did not experience any more bleeding. Continue taking  rest of your medications. We also added some iron supplement, please take it as directed and follow-up with your primary care provider.   Discharge instructions   Complete  by: As directed    As we discussed this morning about the risk of stroke and bleeding with anticoagulation. You agreed to get 1 more unit of blood and will start your Xarelto from tomorrow, you were willing to take the chances of more bleeding which can be life-threatening as you were more afraid of stroke. You can start your Xarelto from tomorrow and watch for any abnormal bleeding, if you experience bleeding you can come to ED and get blood transfusions if needed according to your hemoglobin level. Continue taking your iron supplement and follow-up with your primary care provider.   Increase activity slowly   Complete by: As directed    Increase activity slowly   Complete by: As directed    Increase activity slowly   Complete by: As directed    Increase activity slowly   Complete by: As directed      Allergies as of 08/23/2020      Reactions   Fish-derived Products Swelling   Facial swelling then tongue swelling   Lactuca Virosa Diarrhea   Milk-related Compounds Diarrhea   WHOLE MILK   Other Swelling   Allergy to all nuts - facial swelling, then tongue swelling   Amoxicillin Nausea And Vomiting      Medication List    STOP taking these medications   amLODipine-valsartan 5-320 MG tablet Commonly known as: EXFORGE     TAKE these medications   albuterol 108 (90 Base) MCG/ACT inhaler Commonly known as: VENTOLIN HFA Inhale 2 puffs into the lungs 4 (four) times daily as needed for wheezing or shortness of breath.   atorvastatin 20 MG tablet Commonly known as: Lipitor Take 1 tablet (20 mg total) by mouth daily.   diltiazem 120 MG 24 hr capsule Commonly known as: TIAZAC Take 120 mg by mouth daily.   diphenhydrAMINE 25 MG tablet Commonly known as: BENADRYL Take 25 mg by mouth as needed for itching or allergies.   famotidine 20 MG tablet Commonly known as: PEPCID Take 20 mg by mouth daily.   ferrous sulfate 325 (65 FE) MG tablet Take 1 tablet (325 mg total) by mouth 2  (two) times daily with a meal.   furosemide 20 MG tablet Commonly known as: LASIX Take 20 mg by mouth daily. MAY TAKE 2 TABLETS IF GAINS OVER 1.5 LB IN A DAY   Hematinic Plus Vit/Minerals 106-1 MG Tabs Take 1 tablet by mouth 2 (two) times daily.   hydrocortisone 25 MG suppository Commonly known as: ANUSOL-HC Place 1 suppository (25 mg total) rectally 2 (two) times daily.   latanoprost 0.005 % ophthalmic solution Commonly known as: XALATAN Place 1 drop into both eyes at bedtime.   LORazepam 1 MG tablet Commonly known as: ATIVAN Take 0.5 tablets (0.5 mg total) by mouth every 8 (eight) hours as needed for anxiety. What changed: how much to take   MAGnesium-Oxide 400 (241.3 Mg) MG tablet Generic drug: magnesium oxide Take 800 mg by mouth daily.   memantine 5 MG tablet Commonly known as: NAMENDA Take 5 mg by mouth 2 (two) times daily.   pantoprazole 40 MG tablet Commonly known as: PROTONIX Take 40 mg by mouth daily.   potassium chloride 10 MEQ CR capsule Commonly known as: MICRO-K Take 10 mEq by mouth daily.   Rivaroxaban 15 MG Tabs tablet Commonly known  as: XARELTO Take 1 tablet (15 mg total) by mouth daily with supper.   Symbicort 80-4.5 MCG/ACT inhaler Generic drug: budesonide-formoterol Inhale 2 puffs into the lungs 2 (two) times daily.   Vitamin D3 50 MCG (2000 UT) capsule Take 2,000 Units by mouth daily.       Follow-up Information    [facility] Follow up.   Specialty: Home Health Services Why: The home health agency will contact you for the first home visit. Contact information: [address] [place] [address] [phone]        Garvin Fila, MD. Schedule an appointment as soon as possible for a visit in 4 week(s).   Specialties: Neurology, Radiology Contact information: [address] [place] [address] [phone]        Egbert Garibaldi, [place]. Schedule an appointment as soon as possible for a  visit.   Specialty: Internal Medicine Contact information: [address] [place] [address] [phone]              Allergies  Allergen Reactions  . Fish-Derived Products Swelling    Facial swelling then tongue swelling  . Lactuca Virosa Diarrhea  . Milk-Related Compounds Diarrhea    WHOLE MILK  . Other Swelling    Allergy to all nuts - facial swelling, then tongue swelling  . Amoxicillin Nausea And Vomiting    Consultations:  Neurology  GI  Procedures/Studies: CT Angio Head W or Wo Contrast  Result Date: 08/14/2020 CLINICAL DATA:  Slurred speech. Recent GI bleeding and discharge. Atrial fibrillation EXAM: CT ANGIOGRAPHY HEAD AND NECK TECHNIQUE: Multidetector CT imaging of the head and neck was performed using the standard protocol during bolus administration of intravenous contrast. Multiplanar CT image reconstructions and MIPs were obtained to evaluate the vascular anatomy. Carotid stenosis measurements (when applicable) are obtained utilizing NASCET criteria, using the distal internal carotid diameter as the denominator. CONTRAST:  69mL OMNIPAQUE IOHEXOL 350 MG/ML SOLN COMPARISON:  Brain MRI 05/19/2017 FINDINGS: CT HEAD FINDINGS Brain: Moderate area of cytotoxic edema appearance involving the anterior right insula and frontal operculum. No hemorrhage, hydrocephalus, or masslike finding. Vascular: See below Skull: Unremarkable Sinuses: Mild opacification of the left sphenoid sinus, incidental. Orbits: Negative Review of the MIP images confirms the above findings CTA NECK FINDINGS Aortic arch: Atheromatous calcification.  Two vessel branching. Right carotid system: Mild, especially for age, atheromatous plaque at the bifurcation without stenosis or ulceration. Left carotid system: Mild atheromatous plaque at the bifurcation. There is ICA tortuosity with kinking. No dissection or beading. Vertebral arteries: No proximal subclavian stenosis. The codominant vertebral arteries  are widely patent. Small outpouching laterally from the V2 segment on the right. No generalized beading or dissection flap. Skeleton: Lower cervical disc degeneration and generalized degenerative facet spurring. No acute or aggressive finding. Other neck: Negative Upper chest: Mild emphysema. Review of the MIP images confirms the above findings CTA HEAD FINDINGS Anterior circulation: Atheromatous calcification on the carotid siphons. Right M2 branch occlusion/pre occlusion with faint reconstitution and then subsequent reocclusion at the M3 level, correlating with the level of infarct. Regional M3/4 branch is appear asymmetrically irregular on MIPS. No contralateral embolism is noted. Negative for aneurysm Posterior circulation: The vertebral and basilar arteries are smooth and diffusely patent. Fetal type right PCA. No branch occlusion, beading, or aneurysm. Venous sinuses: Diffusely patent Anatomic variants: None significant Review of the MIP images confirms the above findings IMPRESSION: 1. Right M2/3 branch occlusion with completed insular and  opercular infarction. Regional M3/4 branch is appear asymmetrically irregular, likely from showering of smaller emboli in this territory. 2. Mild for age atherosclerosis in the head and neck. Electronically Signed   By: Monte Fantasia M.D.   On: 08/14/2020 12:30   DG Chest 2 View  Result Date: 08/14/2020 CLINICAL DATA:  Stroke symptoms EXAM: CHEST - 2 VIEW COMPARISON:  03/29/2020 FINDINGS: Stable cardiomegaly. Atherosclerotic calcification of the aortic knob. No focal airspace consolidation, pleural effusion, or pneumothorax. Degenerative changes of the bilateral shoulders. IMPRESSION: Stable cardiomegaly. No acute cardiopulmonary findings. Electronically Signed   By: Davina Poke D.O.   On: 08/14/2020 13:45   CT Angio Neck W and/or Wo Contrast  Result Date: 08/14/2020 CLINICAL DATA:  Slurred speech. Recent GI bleeding and discharge. Atrial fibrillation EXAM:  CT ANGIOGRAPHY HEAD AND NECK TECHNIQUE: Multidetector CT imaging of the head and neck was performed using the standard protocol during bolus administration of intravenous contrast. Multiplanar CT image reconstructions and MIPs were obtained to evaluate the vascular anatomy. Carotid stenosis measurements (when applicable) are obtained utilizing NASCET criteria, using the distal internal carotid diameter as the denominator. CONTRAST:  50mL OMNIPAQUE IOHEXOL 350 MG/ML SOLN COMPARISON:  Brain MRI 05/19/2017 FINDINGS: CT HEAD FINDINGS Brain: Moderate area of cytotoxic edema appearance involving the anterior right insula and frontal operculum. No hemorrhage, hydrocephalus, or masslike finding. Vascular: See below Skull: Unremarkable Sinuses: Mild opacification of the left sphenoid sinus, incidental. Orbits: Negative Review of the MIP images confirms the above findings CTA NECK FINDINGS Aortic arch: Atheromatous calcification.  Two vessel branching. Right carotid system: Mild, especially for age, atheromatous plaque at the bifurcation without stenosis or ulceration. Left carotid system: Mild atheromatous plaque at the bifurcation. There is ICA tortuosity with kinking. No dissection or beading. Vertebral arteries: No proximal subclavian stenosis. The codominant vertebral arteries are widely patent. Small outpouching laterally from the V2 segment on the right. No generalized beading or dissection flap. Skeleton: Lower cervical disc degeneration and generalized degenerative facet spurring. No acute or aggressive finding. Other neck: Negative Upper chest: Mild emphysema. Review of the MIP images confirms the above findings CTA HEAD FINDINGS Anterior circulation: Atheromatous calcification on the carotid siphons. Right M2 branch occlusion/pre occlusion with faint reconstitution and then subsequent reocclusion at the M3 level, correlating with the level of infarct. Regional M3/4 branch is appear asymmetrically irregular on MIPS.  No contralateral embolism is noted. Negative for aneurysm Posterior circulation: The vertebral and basilar arteries are smooth and diffusely patent. Fetal type right PCA. No branch occlusion, beading, or aneurysm. Venous sinuses: Diffusely patent Anatomic variants: None significant Review of the MIP images confirms the above findings IMPRESSION: 1. Right M2/3 branch occlusion with completed insular and opercular infarction. Regional M3/4 branch is appear asymmetrically irregular, likely from showering of smaller emboli in this territory. 2. Mild for age atherosclerosis in the head and neck. Electronically Signed   By: Monte Fantasia M.D.   On: 08/14/2020 12:30   MR BRAIN WO CONTRAST  Result Date: 08/14/2020 CLINICAL DATA:  Stroke follow-up. EXAM: MRI HEAD WITHOUT CONTRAST TECHNIQUE: Multiplanar, multiecho pulse sequences of the brain and surrounding structures were obtained without intravenous contrast. COMPARISON:  05/19/2017 head MRI and 08/14/2020 head and neck CTA FINDINGS: Brain: There is a small to moderate-sized acute right MCA infarct involving the insula and frontal lobe (primarily the operculum). No intracranial hemorrhage, mass, midline shift, or extra-axial fluid collection is identified. Scattered small foci of T2 hyperintensity in the cerebral white matter bilaterally are similar to  the prior MRI and are nonspecific but compatible with chronic small vessel ischemic disease, minimal for age. Mild cerebral atrophy is within normal limits for age. Vascular: Major intracranial vascular flow voids are preserved. Skull and upper cervical spine: Unremarkable bone marrow signal. Sinuses/Orbits: Bilateral cataract extraction. Trace left sphenoid sinus fluid. Small left mastoid effusion. Other: None. IMPRESSION: 1. Small to moderate-sized acute right MCA infarct. 2. Minimal chronic small vessel ischemic disease. Electronically Signed   By: Logan Bores M.D.   On: 08/14/2020 16:11   NM GI Blood  Loss  Result Date: 08/15/2020 CLINICAL DATA:  Lower gastrointestinal bleed. Diverticulosis. Hematochezia. EXAM: NUCLEAR MEDICINE GASTROINTESTINAL BLEEDING SCAN TECHNIQUE: Sequential abdominal images were obtained following intravenous administration of Tc-35m labeled red blood cells. RADIOPHARMACEUTICALS:  21.6 mCi Tc-60m pertechnetate in-vitro labeled red cells. COMPARISON:  Bleeding scan 08/12/2020 FINDINGS: No evidence of active bleeding into the GI tract over 2 hours of imaging. Normal blood pool activity within the vascular structures. Physiologic activity in the kidneys and bladder. IMPRESSION: No evidence of active gastrointestinal bleeding. Electronically Signed   By: Suzy Bouchard M.D.   On: 08/15/2020 19:22   NM GI Blood Loss  Result Date: 08/12/2020 CLINICAL DATA:  Lower GI bleed EXAM: NUCLEAR MEDICINE GASTROINTESTINAL BLEEDING SCAN TECHNIQUE: Sequential abdominal images were obtained following intravenous administration of Tc-70m labeled red blood cells. RADIOPHARMACEUTICALS:  22.0 mCi Tc-86m pertechnetate in-vitro labeled red cells. COMPARISON:  None FINDINGS: Following the IV administration of the radiopharmaceutical dynamic imaging was performed for 1 hour and 20 minutes. No signs of active GI bleed identified. IMPRESSION: 1. Negative for active GI bleed. Electronically Signed   By: Kerby Moors M.D.   On: 08/12/2020 14:53   DG Swallowing Func-Speech Pathology  Result Date: 08/16/2020 Objective Swallowing Evaluation: Type of Study: MBS-Modified Barium Swallow Study  Patient Details Name: Amber Barnett MRN: [id number] Date of Birth: May 09, 1934 Today's Date: 08/16/2020 Time: SLP Start Time (ACUTE ONLY): 1010 -SLP Stop Time (ACUTE ONLY): 1025 SLP Time Calculation (min) (ACUTE ONLY): 15 min Past Medical History: Past Medical History: Diagnosis Date . Atrial fibrillation ([place])  . DVT (deep venous thrombosis) ([place])  . Dysrhythmia  . History of left breast cancer  . Hypertension   . Stroke [facility])  Past Surgical History: Past Surgical History: Procedure Laterality Date . COLONOSCOPY WITH PROPOFOL N/A 12/29/2019  Procedure: COLONOSCOPY WITH PROPOFOL;  Surgeon: Arta Silence, MD;  Location: [facility] END[facility]COPY;  Service: Endoscopy;  Laterality: N/A; . IR ANGIO INTRA EXTRACRAN SEL COM CAROTID INNOMINATE UNI R MOD SED  05/18/2017 . IR ANGIO VERTEBRAL SEL SUBCLAVIAN INNOMINATE UNI R MOD SED  05/18/2017 . IR PERCUTANEOUS ART THROMBECTOMY/INFUSION INTRACRANIAL INC DIAG ANGIO  05/18/2017 . IR RADIOLOGIST EVAL & MGMT  07/19/2017 . RADIOLOGY WITH ANESTHESIA N/A 05/18/2017  Procedure: RADIOLOGY WITH ANESTHESIA;  Surgeon: Luanne Bras, MD;  Location: [place];  Service: Radiology;  Laterality: N/A; HPI: Leronda Merchen is a 87 y.o. female with medical history significant of diverticulosis with recurrent lower GI bleed, chronic A. 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  Imaging revealed R MCA embolic infarct.  Subjective: pleasant, alert Assessment / Plan / Recommendation CHL IP CLINICAL IMPRESSIONS 08/16/2020 Clinical Impression --Patient presents with a mild oropharyngeal dysphagia without penetration or aspiration of any tested boluses (thin liquids, regular solids, barium tablet). Patient exhibited premature spillage of all boluses resulting in swallow initiation delay to level of vallecular  sinus. With thin liquids, there were two instances of swallow initiation delay to pyriform sinus. Trace pyriform sinus residuals noted with thin liquids however these cleared with subsequent swallows. Patient does present with a prominent cricopharyngeal bar which did slow transit of puree and regular solid textures, but did not significantly impact patient's swallow function or safety with PO's.  SLP Visit Diagnosis Dysphagia, oropharyngeal phase (R13.12) Attention and concentration  deficit following -- Frontal lobe and executive function deficit following -- Impact on safety and function No limitations   CHL IP TREATMENT RECOMMENDATION 08/16/2020 Treatment Recommendations Therapy as outlined in treatment plan below   Prognosis 08/16/2020 Prognosis for Safe Diet Advancement Good Barriers to Reach Goals -- Barriers/Prognosis Comment -- CHL IP DIET RECOMMENDATION 08/16/2020 SLP Diet Recommendations Thin liquid;Regular solids Liquid Administration via Cup;Straw Medication Administration Whole meds with liquid Compensations Slow rate;Small sips/bites;Follow solids with liquid Postural Changes Seated upright at 90 degrees   CHL IP OTHER RECOMMENDATIONS 08/16/2020 Recommended Consults -- Oral Care Recommendations Oral care BID Other Recommendations --   CHL IP FOLLOW UP RECOMMENDATIONS 08/16/2020 Follow up Recommendations 24 hour supervision/assistance;None   CHL IP FREQUENCY AND DURATION 08/16/2020 Speech Therapy Frequency (ACUTE ONLY) min 1 x/week Treatment Duration --      CHL IP ORAL PHASE 08/16/2020 Oral Phase Impaired Oral - Pudding Teaspoon -- Oral - Pudding Cup -- Oral - Honey Teaspoon -- Oral - Honey Cup -- Oral - Nectar Teaspoon -- Oral - Nectar Cup -- Oral - Nectar Straw -- Oral - Thin Teaspoon -- Oral - Thin Cup Premature spillage Oral - Thin Straw Premature spillage Oral - Puree Premature spillage Oral - Mech Soft -- Oral - Regular Premature spillage Oral - Multi-Consistency -- Oral - Pill Premature spillage Oral Phase - Comment --  CHL IP PHARYNGEAL PHASE 08/16/2020 Pharyngeal Phase Impaired Pharyngeal- Pudding Teaspoon -- Pharyngeal -- Pharyngeal- Pudding Cup -- Pharyngeal -- Pharyngeal- Honey Teaspoon -- Pharyngeal -- Pharyngeal- Honey Cup -- Pharyngeal -- Pharyngeal- Nectar Teaspoon -- Pharyngeal -- Pharyngeal- Nectar Cup -- Pharyngeal -- Pharyngeal- Nectar Straw -- Pharyngeal -- Pharyngeal- Thin Teaspoon -- Pharyngeal -- Pharyngeal- Thin Cup Delayed swallow  initiation-vallecula;Delayed swallow initiation-pyriform sinuses;Pharyngeal residue - pyriform Pharyngeal -- Pharyngeal- Thin Straw Delayed swallow initiation-vallecula;Pharyngeal residue - pyriform Pharyngeal -- Pharyngeal- Puree Delayed swallow initiation-vallecula Pharyngeal -- Pharyngeal- Mechanical Soft -- Pharyngeal -- Pharyngeal- Regular Delayed swallow initiation-vallecula Pharyngeal -- Pharyngeal- Multi-consistency -- Pharyngeal -- Pharyngeal- Pill Delayed swallow initiation-vallecula Pharyngeal -- Pharyngeal Comment --  CHL IP CERVICAL ESOPHAGEAL PHASE 08/16/2020 Cervical Esophageal Phase Impaired Pudding Teaspoon -- Pudding Cup -- Honey Teaspoon -- Honey Cup -- Nectar Teaspoon -- Nectar Cup -- Nectar Straw -- Thin Teaspoon NT Thin Cup Prominent cricopharyngeal segment Thin Straw Prominent cricopharyngeal segment Puree Prominent cricopharyngeal segment Mechanical Soft NT Regular Prominent cricopharyngeal segment Multi-consistency NT Pill Prominent cricopharyngeal segment Cervical Esophageal Comment Prominent cp bar did result in some slowed bolus transit with purees and with barium tablet but not significanlty impacting swallow overall at this time. Sonia Baller, MA, CCC-SLP Speech Therapy [facility]             ECHOCARDIOGRAM COMPLETE  Result Date: 08/15/2020    ECHOCARDIOGRAM REPORT   Patient Name:   Amber Barnett [facility] Date of Exam: 08/15/2020 Medical Rec #:  [id number]                 Height:       66.0 in Accession #:    [id number]  Weight:       130.0 lb Date of Birth:  1934-06-07                BSA:          1.665 m Patient Age:    87 years                  BP:           123/66 mmHg Patient Gender: F                         HR:           68 bpm. Exam Location:  Inpatient Procedure: 2D Echo, Cardiac Doppler and Color Doppler Indications:    Stroke 434.91 / I163.9  History:        Patient has prior history of Echocardiogram examinations, most                 recent  05/20/2017. Stroke, Arrythmias:Atrial Fibrillation; Risk                 Factors:Hypertension. Current use of long term anticoagulation,                 History of left breast cancer (From Hx).  Sonographer:    Alvino Chapel RCS Referring Phys: [id number] [place]  1. Left ventricular ejection fraction, by estimation, is 60 to 65%. The left ventricle has normal function. The left ventricle has no regional wall motion abnormalities. There is severe concentric left ventricular hypertrophy. Left ventricular diastolic  function could not be evaluated. There is the interventricular septum is flattened in systole and diastole, consistent with right ventricular pressure and volume overload.  2. Right ventricular systolic function is mildly reduced. The right ventricular size is normal. There is mildly elevated pulmonary artery systolic pressure. The estimated right ventricular systolic pressure is 0000000 mmHg.  3. Left atrial size was moderately dilated.  4. Right atrial size was severely dilated.  5. A small pericardial effusion is present. The pericardial effusion is posterior to the left ventricle.  6. The mitral valve is normal in structure. Moderate mitral valve regurgitation. No evidence of mitral stenosis.  7. Tricuspid valve regurgitation is moderate to severe.  8. The aortic valve is normal in structure. Aortic valve regurgitation is mild. Mild to moderate aortic valve sclerosis/calcification is present, without any evidence of aortic stenosis.  9. The inferior vena cava is dilated in size with >50% respiratory variability, suggesting right atrial pressure of 8 mmHg. FINDINGS  Left Ventricle: Left ventricular ejection fraction, by estimation, is 60 to 65%. The left ventricle has normal function. The left ventricle has no regional wall motion abnormalities. The left ventricular internal cavity size was normal in size. There is  severe concentric left ventricular hypertrophy. The interventricular septum is  flattened in systole and diastole, consistent with right ventricular pressure and volume overload. Left ventricular diastolic function could not be evaluated due to atrial fibrillation. Left ventricular diastolic function could not be evaluated. Right Ventricle: The right ventricular size is normal. No increase in right ventricular wall thickness. Right ventricular systolic function is mildly reduced. There is mildly elevated pulmonary artery systolic pressure. The tricuspid regurgitant velocity  is 3.16 m/s, and with an assumed right atrial pressure of 3 mmHg, the estimated right ventricular systolic pressure is 0000000 mmHg. Left Atrium: Left atrial size was moderately dilated. Right Atrium: Right atrial size was severely dilated.  Pericardium: A small pericardial effusion is present. The pericardial effusion is posterior to the left ventricle. Mitral Valve: The mitral valve is normal in structure. Moderate mitral valve regurgitation. No evidence of mitral valve stenosis. MV peak gradient, 7.8 mmHg. The mean mitral valve gradient is 2.0 mmHg. Tricuspid Valve: The tricuspid valve is normal in structure. Tricuspid valve regurgitation is moderate to severe. No evidence of tricuspid stenosis. Aortic Valve: The aortic valve is normal in structure. Aortic valve regurgitation is mild. Aortic regurgitation PHT measures 588 msec. Mild to moderate aortic valve sclerosis/calcification is present, without any evidence of aortic stenosis. Pulmonic Valve: The pulmonic valve was normal in structure. Pulmonic valve regurgitation is trivial. No evidence of pulmonic stenosis. Aorta: The aortic root is normal in size and structure. Venous: The inferior vena cava is dilated in size with greater than 50% respiratory variability, suggesting right atrial pressure of 8 mmHg. IAS/Shunts: No atrial level shunt detected by color flow Doppler.  LEFT VENTRICLE PLAX 2D LVIDd:         3.40 cm LVIDs:         1.70 cm LV PW:         1.20 cm LV IVS:         2.10 cm LVOT diam:     1.60 cm LV SV:         31 LV SV Index:   19 LVOT Area:     2.01 cm  RIGHT VENTRICLE RV S prime:     8.16 cm/s TAPSE (M-mode): 1.3 cm LEFT ATRIUM              Index       RIGHT ATRIUM           Index LA diam:        4.50 cm  2.70 cm/m  RA Area:     33.90 cm LA Vol (A2C):   115.0 ml 69.06 ml/m RA Volume:   112.00 ml 67.26 ml/m LA Vol (A4C):   118.0 ml 70.86 ml/m LA Biplane Vol: 117.0 ml 70.26 ml/m  AORTIC VALVE LVOT Vmax:   81.70 cm/s LVOT Vmean:  55.500 cm/s LVOT VTI:    0.154 m AI PHT:      588 msec  AORTA Ao Root diam: 3.10 cm MITRAL VALVE                TRICUSPID VALVE MV Area (PHT): 5.13 cm     TR Peak grad:   39.9 mmHg MV Peak grad:  7.8 mmHg     TR Vmax:        316.00 cm/s MV Mean grad:  2.0 mmHg MV Vmax:       1.40 m/s     SHUNTS MV Vmean:      52.6 cm/s    Systemic VTI:  0.15 m MV Decel Time: 148 msec     Systemic Diam: 1.60 cm MR Peak grad: 75.7 mmHg MR Mean grad: 52.0 mmHg MR Vmax:      435.00 cm/s MR Vmean:     337.0 cm/s MV E velocity: 123.00 cm/s Ena Dawley MD Electronically signed by Ena Dawley MD Signature Date/Time: 08/15/2020/4:11:21 PM    Final     Subjective: Patient was seen and examined today.  No new complaint.  She wants to go back home.  Denies any more active bleeding.  Discharge Exam: Vitals:   08/23/20 0749 08/23/20 1116  BP:  137/74  Pulse: 77 91  Resp: 14 (!) 21  Temp:  97.6 F (36.4 C)  SpO2: 100% 99%   Vitals:   08/23/20 0728 08/23/20 0749 08/23/20 1115 08/23/20 1116  BP: 130/73   137/74  Pulse: 85 77  91  Resp: 18 14  (!) 21  Temp: (!) 97.3 F (36.3 C)   97.6 F (36.4 C)  TempSrc: Oral  Oral Oral  SpO2: 100% 100%  99%  Weight:      Height:        General: Pt is alert, awake, not in acute distress Cardiovascular: RRR, S1/S2 +, no rubs, no gallops Respiratory: CTA bilaterally, no wheezing, no rhonchi Abdominal: Soft, NT, ND, bowel sounds + Extremities: no edema, no cyanosis   The results of significant  diagnostics from this hospitalization (including imaging, microbiology, ancillary and laboratory) are listed below for reference.    Microbiology: Recent Results (from the past 240 hour(s))  Resp Panel by RT-PCR (Flu A&B, Covid) Nasopharyngeal Swab     Status: None   Collection Time: 08/14/20 12:35 PM   Specimen: Nasopharyngeal Swab; Nasopharyngeal(NP) swabs in vial transport medium  Result Value Ref Range Status   SARS Coronavirus 2 by RT PCR NEGATIVE NEGATIVE Final    Comment: (NOTE) SARS-CoV-2 target nucleic acids are NOT DETECTED.  The SARS-CoV-2 RNA is generally detectable in upper respiratory specimens during the acute phase of infection. The lowest concentration of SARS-CoV-2 viral copies this assay can detect is 138 copies/mL. A negative result does not preclude SARS-Cov-2 infection and should not be used as the sole basis for treatment or other patient management decisions. A negative result may occur with  improper specimen collection/handling, submission of specimen other than nasopharyngeal swab, presence of viral mutation(s) within the areas targeted by this assay, and inadequate number of viral copies(<138 copies/mL). A negative result must be combined with clinical observations, patient history, and epidemiological information. The expected result is Negative.  Fact Sheet for Patients:  EntrepreneurPulse.com.au  Fact Sheet for Healthcare Providers:  IncredibleEmployment.be  This test is no t yet approved or cleared by the [place] FDA and  has been authorized for detection and/or diagnosis of SARS-CoV-2 by FDA under an Emergency Use Authorization (EUA). This EUA will remain  in effect (meaning this test can be used) for the duration of the COVID-19 declaration under Section 564(b)(1) of the Act, 21 U.S.C.section 360bbb-3(b)(1), unless the authorization is terminated  or revoked sooner.       Influenza A by PCR NEGATIVE  NEGATIVE Final   Influenza B by PCR NEGATIVE NEGATIVE Final    Comment: (NOTE) The Xpert Xpress SARS-CoV-2/FLU/RSV plus assay is intended as an aid in the diagnosis of influenza from Nasopharyngeal swab specimens and should not be used as a sole basis for treatment. Nasal washings and aspirates are unacceptable for Xpert Xpress SARS-CoV-2/FLU/RSV testing.  Fact Sheet for Patients: EntrepreneurPulse.com.au  Fact Sheet for Healthcare Providers: IncredibleEmployment.be  This test is not yet approved or cleared by the [place] FDA and has been authorized for detection and/or diagnosis of SARS-CoV-2 by FDA under an Emergency Use Authorization (EUA). This EUA will remain in effect (meaning this test can be used) for the duration of the COVID-19 declaration under Section 564(b)(1) of the Act, 21 U.S.C. section 360bbb-3(b)(1), unless the authorization is terminated or revoked.  Performed at [facility], [place] [address], [place], [place] [address]      Labs: BNP (last 3 results) No results for input(s): BNP in the last 8760 hours. Basic Metabolic Panel: Recent Labs  Lab 08/17/20 0406 08/18/20 0247 08/19/20 0257 08/20/20 0313 08/21/20 0149 08/22/20 0320  NA 141 140 138 138 139 139  K 3.7 3.6 3.8 4.0 3.9 4.2  CL 113* 109 106 107 107 107  CO2 21* 22 24 22 23 23   GLUCOSE 96 99 95 92 90 93  BUN 9 7* 12 15 15 14   CREATININE 1.06* 1.03* 1.10* 1.16* 1.13* 1.17*  CALCIUM 8.2* 8.5* 8.4* 8.3* 8.6* 8.4*  MG 1.7 1.8  --   --   --   --    Liver Function Tests: No results for input(s): AST, ALT, ALKPHOS, BILITOT, PROT, ALBUMIN in the last 168 hours. No results for input(s): LIPASE, AMYLASE in the last 168 hours. No results for input(s): AMMONIA in the last 168 hours. CBC: Recent Labs  Lab 08/19/20 1422 08/20/20 0313 08/21/20 0149 08/22/20 0320 08/23/20 0107  WBC 9.7 8.2 8.7 7.9 7.4  HGB 9.4* 8.5* 8.4* 7.8* 7.7*  HCT 29.9* 27.2*  26.4* 25.2* 23.7*  MCV 82.4 83.7 83.0 84.3 82.9  PLT 247 214 242 251 238   Cardiac Enzymes: No results for input(s): CKTOTAL, CKMB, CKMBINDEX, TROPONINI in the last 168 hours. BNP: Invalid input(s): POCBNP CBG: No results for input(s): GLUCAP in the last 168 hours. D-Dimer No results for input(s): DDIMER in the last 72 hours. Hgb A1c No results for input(s): HGBA1C in the last 72 hours. Lipid Profile No results for input(s): CHOL, HDL, LDLCALC, TRIG, CHOLHDL, LDLDIRECT in the last 72 hours. Thyroid function studies No results for input(s): TSH, T4TOTAL, T3FREE, THYROIDAB in the last 72 hours.  Invalid input(s): FREET3 Anemia work up No results for input(s): VITAMINB12, FOLATE, FERRITIN, TIBC, IRON, RETICCTPCT in the last 72 hours. Urinalysis    Component Value Date/Time   COLORURINE YELLOW 08/14/2020 1458   APPEARANCEUR HAZY (A) 08/14/2020 1458   LABSPEC 1.035 (H) 08/14/2020 1458   PHURINE 7.0 08/14/2020 1458   GLUCOSEU NEGATIVE 08/14/2020 1458   HGBUR NEGATIVE 08/14/2020 1458   BILIRUBINUR NEGATIVE 08/14/2020 1458   [place] 08/14/2020 1458   PROTEINUR 30 (A) 08/14/2020 1458   UROBILINOGEN >8.0 (H) 07/19/2014 0929   NITRITE NEGATIVE 08/14/2020 1458   LEUKOCYTESUR LARGE (A) 08/14/2020 1458   Sepsis Labs Invalid input(s): PROCALCITONIN,  WBC,  LACTICIDVEN Microbiology Recent Results (from the past 240 hour(s))  Resp Panel by RT-PCR (Flu A&B, Covid) Nasopharyngeal Swab     Status: None   Collection Time: 08/14/20 12:35 PM   Specimen: Nasopharyngeal Swab; Nasopharyngeal(NP) swabs in vial transport medium  Result Value Ref Range Status   SARS Coronavirus 2 by RT PCR NEGATIVE NEGATIVE Final    Comment: (NOTE) SARS-CoV-2 target nucleic acids are NOT DETECTED.  The SARS-CoV-2 RNA is generally detectable in upper respiratory specimens during the acute phase of infection. The lowest concentration of SARS-CoV-2 viral copies this assay can detect is 138 copies/mL.  A negative result does not preclude SARS-Cov-2 infection and should not be used as the sole basis for treatment or other patient management decisions. A negative result may occur with  improper specimen collection/handling, submission of specimen other than nasopharyngeal swab, presence of viral mutation(s) within the areas targeted by this assay, and inadequate number of viral copies(<138 copies/mL). A negative result must be combined with clinical observations, patient history, and epidemiological information. The expected result is Negative.  Fact Sheet for Patients:  EntrepreneurPulse.com.au  Fact Sheet for Healthcare Providers:  IncredibleEmployment.be  This test is no t yet approved or cleared by the [place]  FDA and  has been authorized for detection and/or diagnosis of SARS-CoV-2 by FDA under an Emergency Use Authorization (EUA). This EUA will remain  in effect (meaning this test can be used) for the duration of the COVID-19 declaration under Section 564(b)(1) of the Act, 21 U.S.C.section 360bbb-3(b)(1), unless the authorization is terminated  or revoked sooner.       Influenza A by PCR NEGATIVE NEGATIVE Final   Influenza B by PCR NEGATIVE NEGATIVE Final    Comment: (NOTE) The Xpert Xpress SARS-CoV-2/FLU/RSV plus assay is intended as an aid in the diagnosis of influenza from Nasopharyngeal swab specimens and should not be used as a sole basis for treatment. Nasal washings and aspirates are unacceptable for Xpert Xpress SARS-CoV-2/FLU/RSV testing.  Fact Sheet for Patients: BloggerCourse.com  Fact Sheet for Healthcare Providers: SeriousBroker.it  This test is not yet approved or cleared by the [place] FDA and has been authorized for detection and/or diagnosis of SARS-CoV-2 by FDA under an Emergency Use Authorization (EUA). This EUA will remain in effect (meaning this test  can be used) for the duration of the COVID-19 declaration under Section 564(b)(1) of the Act, 21 U.S.C. section 360bbb-3(b)(1), unless the authorization is terminated or revoked.  Performed at [facility], [address], [place], [place] [address]     Time coordinating discharge: Over 30 minutes  SIGNED:  Arnetha Courser, MD  [facility] 08/23/2020, 1:25 PM  If 7PM-7AM, please contact night-coverage [email or web address]  This record has been created using Conservation officer, historic buildings. Errors have been sought and corrected,but may not always be located. Such creation errors do not reflect on the standard of care.

## 2020-08-23 ENCOUNTER — Telehealth: Payer: Self-pay | Admitting: Neurology

## 2020-08-23 DIAGNOSIS — K922 Gastrointestinal hemorrhage, unspecified: Secondary | ICD-10-CM | POA: Diagnosis not present

## 2020-08-23 DIAGNOSIS — I4821 Permanent atrial fibrillation: Secondary | ICD-10-CM | POA: Diagnosis not present

## 2020-08-23 DIAGNOSIS — I10 Essential (primary) hypertension: Secondary | ICD-10-CM | POA: Diagnosis not present

## 2020-08-23 LAB — CBC
HCT: 23.7 % — ABNORMAL LOW (ref 36.0–46.0)
HCT: 29.7 % — ABNORMAL LOW (ref 36.0–46.0)
Hemoglobin: 7.7 g/dL — ABNORMAL LOW (ref 12.0–15.0)
Hemoglobin: 9.4 g/dL — ABNORMAL LOW (ref 12.0–15.0)
MCH: 26.9 pg (ref 26.0–34.0)
MCH: 26.5 pg (ref 26.0–34.0)
MCHC: 32.5 g/dL (ref 30.0–36.0)
MCHC: 31.6 g/dL (ref 30.0–36.0)
MCV: 82.9 fL (ref 80.0–100.0)
MCV: 83.7 fL (ref 80.0–100.0)
Platelets: 238 10*3/uL (ref 150–400)
Platelets: 254 10*3/uL (ref 150–400)
RBC: 2.86 MIL/uL — ABNORMAL LOW (ref 3.87–5.11)
RBC: 3.55 MIL/uL — ABNORMAL LOW (ref 3.87–5.11)
RDW: 17.8 % — ABNORMAL HIGH (ref 11.5–15.5)
RDW: 17.4 % — ABNORMAL HIGH (ref 11.5–15.5)
WBC: 7.4 10*3/uL (ref 4.0–10.5)
WBC: 8.3 10*3/uL (ref 4.0–10.5)
nRBC: 0 % (ref 0.0–0.2)
nRBC: 0 % (ref 0.0–0.2)

## 2020-08-23 LAB — PREPARE RBC (CROSSMATCH)

## 2020-08-23 MED ORDER — SODIUM CHLORIDE 0.9% IV SOLUTION: Freq: Once | INTRAVENOUS | Status: AC

## 2020-08-23 NOTE — Plan of Care (Signed)

## 2020-08-23 NOTE — Telephone Encounter (Signed)
Pt's caretaker [place] Engineer, production (niece) on DPR called needing to speak to the RN or provider about the pt's blood thinners. Please advise.

## 2020-08-23 NOTE — Progress Notes (Signed)
Patient was discharged yesterday after discussing with GI and neurology and initially agrees to hold anticoagulation until she will see neurologist as an outpatient. Her caregiver who is her niece refused to take her back home without anticoagulation and was shouting at very disrespectful on phone.  This morning patient herself does not want to go home without anticoagulation as told by her niece that she will die of stroke if she will not take her anticoagulation.  I tried explaining risk and benefits but patient was very tearful and wants to continue with anticoagulation for the fear of another stroke.  She agreed with the plan that we will give her another unit of PRBC as hemoglobin is at 7.7 to give her some room and she will start Eliquis from tomorrow.  If she bleeds again she can come to ED and get blood transfusions or her primary care physician can arrange transfusions at [facility].  1 unit of PRBC ordered.  General.  Frail elderly lady, in no acute distress. Pulmonary.  Lungs clear bilaterally, normal respiratory effort. CV.  Regular rate and rhythm, no JVD, rub or murmur. Abdomen.  Soft, nontender, nondistended, BS positive. CNS.  Alert and oriented x3.  No focal neurologic deficit. Extremities.  No edema, no cyanosis, pulses intact and symmetrical. Psychiatry.  Judgment and insight appears normal.  Patient can be discharged after transfusion. She will restart her Eliquis from tomorrow.  Had a long discussion regarding risk of stroke and bleeding.  She is in a hard spot and finding a balance will be hard.  She is more afraid of stroke and would like to continue with anticoagulation despite the risk of bleeding.

## 2020-08-23 NOTE — Progress Notes (Signed)
At 0900 therapist was working with her and came across the hall to report to me that pt was c/o left leg pain and numbness and nausea.  She also reported that her HR was up to 150s while she was working with her.  By the time I got to room, just a minute later, she denied pain and numbness.  She also said her nausea was gone.  Her SBP was 164 and she was very concerned about that and I had to remind her that when she is anxious and upset her BP will go up as well as her heart rate.  Once she calmed down, her SBP was 126 and her HR was down to 88-94.  She does often cry and says she cannot go home and take care of her self because she needs 24 hour care.  This nurse reminded her that she is very independent and that she does need to get out of the hospital, as the longer she stays, it could cause other problems for her and getting home is the best option for healing and getting well.  She agreed and said she really feels good but gets self worked up.

## 2020-08-23 NOTE — Plan of Care (Signed)
  Problem: Education: Goal: Knowledge of General Education information will improve Description: Including pain rating scale, medication(s)/side effects and non-pharmacologic comfort measures Outcome: Progressing   Problem: Health Behavior/Discharge Planning: Goal: Ability to manage health-related needs will improve Outcome: Progressing   Problem: Clinical Measurements: Goal: Ability to maintain clinical measurements within normal limits will improve Outcome: Progressing Goal: Will remain free from infection Outcome: Progressing Goal: Diagnostic test results will improve Outcome: Progressing Goal: Respiratory complications will improve Outcome: Progressing Goal: Cardiovascular complication will be avoided Outcome: Progressing   Problem: Activity: Goal: Risk for activity intolerance will decrease Outcome: Progressing   Problem: Nutrition: Goal: Adequate nutrition will be maintained Outcome: Progressing   Problem: Coping: Goal: Level of anxiety will decrease Outcome: Progressing   Problem: Elimination: Goal: Will not experience complications related to bowel motility Outcome: Progressing Goal: Will not experience complications related to urinary retention Outcome: Progressing   Problem: Pain Managment: Goal: General experience of comfort will improve Outcome: Progressing   Problem: Safety: Goal: Ability to remain free from injury will improve Outcome: Progressing   Problem: Skin Integrity: Goal: Risk for impaired skin integrity will decrease Outcome: Progressing   Problem: Education: Goal: Knowledge of disease or condition will improve Outcome: Progressing Goal: Knowledge of secondary prevention will improve Outcome: Progressing Goal: Knowledge of patient specific risk factors addressed and post discharge goals established will improve Outcome: Progressing Goal: Individualized Educational Video(s) Outcome: Progressing   Problem: Coping: Goal: Will verbalize  positive feelings about self Outcome: Progressing Goal: Will identify appropriate support needs Outcome: Progressing   Problem: Health Behavior/Discharge Planning: Goal: Ability to manage health-related needs will improve Outcome: Progressing   Problem: Self-Care: Goal: Ability to participate in self-care as condition permits will improve Outcome: Progressing Goal: Verbalization of feelings and concerns over difficulty with self-care will improve Outcome: Progressing Goal: Ability to communicate needs accurately will improve Outcome: Progressing   Problem: Nutrition: Goal: Risk of aspiration will decrease Outcome: Progressing   

## 2020-08-23 NOTE — Progress Notes (Signed)
Physical Therapy Treatment Patient Details Name: Amber Barnett MRN: [id number] DOB: 09/28/1933 Today's Date: 08/23/2020    History of Present Illness Gioconda Dehoff is a 87 y.o. female with medical history significant of diverticulosis with recurrent lower GI bleed, chronic A. 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  Imaging revealed R MCA embolic infarct. Heparin re-started and stopped again due to further bleeding.    PT Comments    Patient progressing slowly towards PT goals. Reports feeling good initially today however session limited due to tachycardia with HR up to 152 bpm max during minimal activity. + nausea post ambulation and new LLE weakness/tingling and pain reported. RN notified. BP pre activity 120/90, post activity BP 141/49.  Tolerated short distance ambulation with close min guard for safety. Very slow gait speed putting pt at increased risk for falls. Encourage use of RW at home for safety. Will continue to follow and progress as able.   Follow Up Recommendations  Home health PT;Supervision/Assistance - 24 hour     Equipment Recommendations  None recommended by PT    Recommendations for Other Services       Precautions / Restrictions Precautions Precautions: Fall;Other (comment) Precaution Comments: watch HR; GI bleed Restrictions Weight Bearing Restrictions: No    Mobility  Bed Mobility Overal bed mobility: Modified Independent             General bed mobility comments: No assist needed.  Transfers Overall transfer level: Needs assistance Equipment used: None Transfers: Sit to/from Stand Sit to Stand: Supervision;Min guard         General transfer comment: Supervision to stand from EOB x1, from toilet x1,from chair x2. ASsist to manage lines. No dizziness initially.  Ambulation/Gait Ambulation/Gait  assistance: Min guard Gait Distance (Feet): 60 Feet Assistive device: None Gait Pattern/deviations: Step-to pattern;Step-through pattern;Decreased stride length;Narrow base of support Gait velocity: .58 ft/sec Gait velocity interpretation: <1.8 ft/sec, indicate of risk for recurrent falls General Gait Details: Very slow, step to gait progerssing to step through on some occasions reaching for rail as needed; a few standing rest breaks. HR up to 152 bpm, A-fib. + nausea and LLE weakness/tingling which is NEW.   Stairs             Wheelchair Mobility    Modified Rankin (Stroke Patients Only) Modified Rankin (Stroke Patients Only) Pre-Morbid Rankin Score: No significant disability Modified Rankin: Moderately severe disability     Balance Overall balance assessment: Needs assistance Sitting-balance support: No upper extremity supported;Feet supported Sitting balance-Leahy Scale: Good     Standing balance support: During functional activity Standing balance-Leahy Scale: Fair Standing balance comment: Able to stand at sink and wash hands reaching outside BoS without difficulty.                            Cognition Arousal/Alertness: Awake/alert Behavior During Therapy: WFL for tasks assessed/performed Overall Cognitive Status: Within Functional Limits for tasks assessed                                        Exercises      General Comments General comments (skin integrity, edema, etc.): HR up to 152 bpm max during minimal activity. + nausea post ambulation. New LLE weakness/tingling and pain  reported today. RN notified. BP pre activity 120/90, post activity BP 141/49.      Pertinent Vitals/Pain Pain Assessment: 0-10 Pain Score: 6  Pain Location: LLE after ambulation Pain Descriptors / Indicators: Sore Pain Intervention(s): Monitored during session;Limited activity within patient's tolerance;Repositioned    Home Living                       Prior Function            PT Goals (current goals can now be found in the care plan section) Progress towards PT goals: Progressing toward goals (slowly but limited due to HR, nausea)    Frequency    Min 4X/week      PT Plan Current plan remains appropriate    Co-evaluation              AM-PAC PT "6 Clicks" Mobility   Outcome Measure  Help needed turning from your back to your side while in a flat bed without using bedrails?: None Help needed moving from lying on your back to sitting on the side of a flat bed without using bedrails?: None Help needed moving to and from a bed to a chair (including a wheelchair)?: A Little Help needed standing up from a chair using your arms (e.g., wheelchair or bedside chair)?: A Little Help needed to walk in hospital room?: A Little Help needed climbing 3-5 steps with a railing? : A Little 6 Click Score: 20    End of Session Equipment Utilized During Treatment: Gait belt Activity Tolerance: Treatment limited secondary to medical complications (Comment) (HR, new LLE tingling/weakness/pain) Patient left: in chair;with call bell/phone within reach;with chair alarm set Nurse Communication: Mobility status;Other (comment) (new LLE tingling/weakness/pain, nausea) PT Visit Diagnosis: Unsteadiness on feet (R26.81);Muscle weakness (generalized) (M62.81)     Time: 8333-8329 PT Time Calculation (min) (ACUTE ONLY): 25 min  Charges:  $Gait Training: 8-22 mins $Therapeutic Activity: 8-22 mins                     Vale Haven, PT, DPT Acute Rehabilitation Services Pager [phone] Office [phone]       Blake Divine A Lanier Ensign 08/23/2020, 9:59 AM

## 2020-08-23 NOTE — Telephone Encounter (Signed)
Unable to reach by phone, left message to return call.

## 2020-08-23 NOTE — Progress Notes (Signed)
Discharged to home after IV access removed and discharge instructions reviewed with pt and family.  All questions answered.

## 2020-08-23 NOTE — Progress Notes (Signed)
Occupational Therapy Treatment Patient Details Name: Amber Barnett MRN: [id number] DOB: [birth year] Today's Date: 08/23/2020    History of present illness Amber Barnett is a 87 y.o. female with medical history significant of diverticulosis with recurrent lower GI bleed, chronic A. 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  Imaging revealed R MCA embolic infarct. Heparin re-started and stopped again due to further bleeding.   OT comments  Pt seen for OT follow up session to focus on safe d/c planning for home. Pt now c/o L calf throbbing/tingling with mobility. HR was also up to 135 bpm with mobilizing in hallway- pt also +dizziness. She was able to mobilize ~100 ft in the hallway with x2 seated rest breaks. Reviewed home safety situations to reduce falls when experiencing dizziness. Also discussed safe way to get up from fall. D/c recs remain appropriate, will continue to follow.   Follow Up Recommendations  Other (comment) (home safety eval)    Equipment Recommendations  None recommended by OT    Recommendations for Other Services      Precautions / Restrictions Precautions Precautions: Fall;Other (comment) Precaution Comments: watch HR; GI bleed Restrictions Weight Bearing Restrictions: No       Mobility Bed Mobility Overal bed mobility: Modified Independent             General bed mobility comments: No assist needed.  Transfers Overall transfer level: Needs assistance Equipment used: None Transfers: Sit to/from Stand Sit to Stand: Supervision         General transfer comment: supervision for safety due to dizziness    Balance Overall balance assessment: Needs assistance Sitting-balance support: No upper extremity supported;Feet supported Sitting balance-Leahy Scale: Good     Standing balance support: During  functional activity Standing balance-Leahy Scale: Fair Standing balance comment: limited due to feelings of dizziness                           ADL either performed or assessed with clinical judgement   ADL Overall ADL's : Needs assistance/impaired     Grooming: Supervision/safety;Standing                   Toilet Transfer: Min guard;Ambulation;Regular Toilet             General ADL Comments: Pt completed ADL without DME this date. Continued to work on challenging dynamic standing balance. Pt required x2 seated rest breaks due to feelings of dizziness.     Vision Patient Visual Report: No change from baseline     Perception     Praxis      Cognition Arousal/Alertness: Awake/alert Behavior During Therapy: WFL for tasks assessed/performed Overall Cognitive Status: Within Functional Limits for tasks assessed                                          Exercises     Shoulder Instructions       General Comments HR up to 152 bpm max during minimal activity. + nausea post ambulation. New LLE weakness/tingling and pain reported today. RN notified. BP pre activity 120/90, post activity BP 141/49.    Pertinent Vitals/ Pain       Pain Assessment: Faces Pain Score: 6  Faces Pain Scale: Hurts little  more Pain Location: LLE after ambulation Pain Descriptors / Indicators: Sore;Tingling Pain Intervention(s): Limited activity within patient's tolerance;Monitored during session;Repositioned  Home Living                                          Prior Functioning/Environment              Frequency  Min 2X/week        Progress Toward Goals  OT Goals(current goals can now be found in the care plan section)  Progress towards OT goals: Progressing toward goals  Acute Rehab OT Goals Patient Stated Goal: home to her dog, Cream OT Goal Formulation: With patient Time For Goal Achievement: 08/31/20 Potential to  Achieve Goals: Good  Plan Discharge plan remains appropriate;Frequency remains appropriate    Co-evaluation                 AM-PAC OT "6 Clicks" Daily Activity     Outcome Measure   Help from another person eating meals?: None Help from another person taking care of personal grooming?: None Help from another person toileting, which includes using toliet, bedpan, or urinal?: A Little Help from another person bathing (including washing, rinsing, drying)?: A Little Help from another person to put on and taking off regular upper body clothing?: None Help from another person to put on and taking off regular lower body clothing?: None 6 Click Score: 22    End of Session Equipment Utilized During Treatment: Gait belt  OT Visit Diagnosis: Unsteadiness on feet (R26.81);Muscle weakness (generalized) (M62.81)   Activity Tolerance Patient tolerated treatment well   Patient Left in bed;with call bell/phone within reach   Nurse Communication Mobility status        Time: [id number] OT Time Calculation (min): 15 min  Charges: OT General Charges $OT Visit: 1 Visit OT Treatments $Self Care/Home Management : 8-22 mins  Zenovia Jarred, MSOT, OTR/L [facility] Office Number: [phone] Pager: [phone]  Zenovia Jarred 08/23/2020, 12:52 PM

## 2020-08-24 ENCOUNTER — Institutional Professional Consult (permissible substitution): Payer: Medicare HMO | Admitting: Cardiology

## 2020-08-24 LAB — TYPE AND SCREEN
ABO/RH(D): B POS
Antibody Screen: NEGATIVE
Unit division: 0

## 2020-08-24 LAB — BPAM RBC
Blood Product Expiration Date: 202201102359
ISSUE DATE / TIME: 202201031307
Unit Type and Rh: 1700

## 2020-08-24 NOTE — Telephone Encounter (Signed)
Left message for return call to office.  

## 2020-08-25 NOTE — Telephone Encounter (Signed)
Left 3rd message to return call.  Completed message from [email or web address]

## 2020-08-30 ENCOUNTER — Ambulatory Visit (HOSPITAL_BASED_OUTPATIENT_CLINIC_OR_DEPARTMENT_OTHER)
Admission: RE | Admit: 2020-08-30 | Discharge: 2020-08-30 | Disposition: A | Discharge status: Home / Self Care | Payer: Medicare HMO | Source: Ambulatory Visit | Attending: Neurology | Admitting: Neurology

## 2020-08-30 ENCOUNTER — Other Ambulatory Visit: Payer: Self-pay

## 2020-08-30 DIAGNOSIS — I63411 Cerebral infarction due to embolism of right middle cerebral artery: Secondary | ICD-10-CM | POA: Diagnosis not present

## 2020-08-30 DIAGNOSIS — I6990 Unspecified sequelae of unspecified cerebrovascular disease: Secondary | ICD-10-CM | POA: Insufficient documentation

## 2020-09-07 ENCOUNTER — Other Ambulatory Visit: Payer: Self-pay

## 2020-09-07 ENCOUNTER — Telehealth (INDEPENDENT_AMBULATORY_CARE_PROVIDER_SITE_OTHER): Payer: Medicare HMO | Admitting: Cardiology

## 2020-09-07 DIAGNOSIS — K922 Gastrointestinal hemorrhage, unspecified: Secondary | ICD-10-CM

## 2020-09-07 DIAGNOSIS — I4821 Permanent atrial fibrillation: Secondary | ICD-10-CM

## 2020-09-07 DIAGNOSIS — I63412 Cerebral infarction due to embolism of left middle cerebral artery: Secondary | ICD-10-CM

## 2020-09-07 DIAGNOSIS — Z7901 Long term (current) use of anticoagulants: Secondary | ICD-10-CM

## 2020-09-07 DIAGNOSIS — K625 Hemorrhage of anus and rectum: Secondary | ICD-10-CM | POA: Diagnosis not present

## 2020-09-07 NOTE — Progress Notes (Signed)
Kindly inform the patient that carotid ultrasound study shows no significant bilateral extracranial carotid stenosis on either side

## 2020-09-07 NOTE — Progress Notes (Signed)
Virtual Visit via Telephone Note   This visit type was conducted due to national recommendations for restrictions regarding the COVID-19 Pandemic (e.g. social distancing) in an effort to limit this patient's exposure and mitigate transmission in our community.  Due to her co-morbid illnesses, this patient is at least at moderate risk for complications without adequate follow up.  This format is felt to be most appropriate for this patient at this time.  The patient did not have access to video technology/had technical difficulties with video requiring transitioning to audio format only (telephone).  All issues noted in this document were discussed and addressed.  No physical exam could be performed with this format.  Please refer to the patient's chart for her  consent to telehealth for [facility].    Date:  09/07/2020   ID:  Amber Barnett, DOB 12/30/1933, MRN [id number] The patient was identified using 2 identifiers.  Patient Location: Home Provider Location: Home Office  PCP:  Egbert Garibaldi, PA-C  Cardiologist:  No primary care provider on file.  Electrophysiologist:  Vickie Epley, MD   Evaluation Performed:  New Patient Evaluation  Chief Complaint: Atrial fibrillation  History of Present Illness:    Amber Barnett is a 87 y.o. female with atrial fibrillation, hypertension, diverticulosis, CVA in 2018 post mechanical embolectomy and recently in December 2021, GI bleed, DVT.  She was recently admitted to the hospital from August 14, 2020 through August 23, 2020.  She presented to the hospital with severe dysarthria on December 25.  This is in the setting of a recent hospitalization on December 22 through December 24 for hematochezia.  During that hospitalization Xarelto was held and then resumed at the time of discharge.  She has had prior GI bleeding thought to be secondary to hemorrhoids and diverticulosis.  After the most recent hospitalization, the  patient and her family discussed the risks and benefits of continued anticoagulation with the neurology and GI teams.  The family and the patient elected to continue anticoagulation given the patient's fear of recurrent stroke.  The patient does not have symptoms concerning for COVID-19 infection (fever, chills, cough, or new shortness of breath).    Past Medical History:  Diagnosis Date  . Atrial fibrillation ([place])   . DVT (deep venous thrombosis) ([place])   . Dysrhythmia   . History of left breast cancer   . Hypertension   . Stroke [facility])    Past Surgical History:  Procedure Laterality Date  . COLONOSCOPY WITH PROPOFOL N/A 12/29/2019   Procedure: COLONOSCOPY WITH PROPOFOL;  Surgeon: Arta Silence, MD;  Location: [facility];  Service: Endoscopy;  Laterality: N/A;  . IR ANGIO INTRA EXTRACRAN SEL COM CAROTID INNOMINATE UNI R MOD SED  05/18/2017  . IR ANGIO VERTEBRAL SEL SUBCLAVIAN INNOMINATE UNI R MOD SED  05/18/2017  . IR PERCUTANEOUS ART THROMBECTOMY/INFUSION INTRACRANIAL INC DIAG ANGIO  05/18/2017  . IR RADIOLOGIST EVAL & MGMT  07/19/2017  . RADIOLOGY WITH ANESTHESIA N/A 05/18/2017   Procedure: RADIOLOGY WITH ANESTHESIA;  Surgeon: Luanne Bras, MD;  Location: [facility];  Service: Radiology;  Laterality: N/A;     Current Meds  Medication Sig  . albuterol (VENTOLIN HFA) 108 (90 Base) MCG/ACT inhaler Inhale 2 puffs into the lungs 4 (four) times daily as needed for wheezing or shortness of breath.  Marland Kitchen apixaban (ELIQUIS) 2.5 MG TABS tablet Take by mouth 2 (two) times daily.  Marland Kitchen atorvastatin (LIPITOR) 20 MG tablet Take 1 tablet (20 mg total) by mouth  daily.  . Cholecalciferol (VITAMIN D3) 2000 units capsule Take 2,000 Units by mouth daily.  Marland Kitchen diltiazem (TIAZAC) 120 MG 24 hr capsule Take 120 mg by mouth daily.  . diphenhydrAMINE (BENADRYL) 25 MG tablet Take 25 mg by mouth as needed for itching or allergies.  . famotidine (PEPCID) 20 MG tablet Take 20 mg by mouth daily.  . Fe Fum-FA-B  Cmp-C-Zn-Mg-Mn-Cu (HEMATINIC PLUS VIT/MINERALS) 106-1 MG TABS Take 1 tablet by mouth 2 (two) times daily.  . ferrous sulfate 325 (65 FE) MG tablet Take 1 tablet (325 mg total) by mouth 2 (two) times daily with a meal.  . furosemide (LASIX) 20 MG tablet Take 20 mg by mouth daily. MAY TAKE 2 TABLETS IF GAINS OVER 1.5 LB IN A DAY  . latanoprost (XALATAN) 0.005 % ophthalmic solution Place 1 drop into both eyes at bedtime.  Marland Kitchen LORazepam (ATIVAN) 1 MG tablet Take 0.5 tablets (0.5 mg total) by mouth every 8 (eight) hours as needed for anxiety.  Marland Kitchen MAGNESIUM-OXIDE 400 (241.3 Mg) MG tablet Take 800 mg by mouth daily.   . memantine (NAMENDA) 5 MG tablet Take 5 mg by mouth daily.  . pantoprazole (PROTONIX) 40 MG tablet Take 40 mg by mouth daily.  . potassium chloride (MICRO-K) 10 MEQ CR capsule Take 10 mEq by mouth daily.  . SYMBICORT 80-4.5 MCG/ACT inhaler Inhale 2 puffs into the lungs 2 (two) times daily.  . [DISCONTINUED] hydrocortisone (ANUSOL-HC) 25 MG suppository Place 1 suppository (25 mg total) rectally 2 (two) times daily.  . [DISCONTINUED] Rivaroxaban (XARELTO) 15 MG TABS tablet Take 1 tablet (15 mg total) by mouth daily with supper.     Allergies:   Fish-derived products, Lactuca virosa, Milk-related compounds, Other, and Amoxicillin   Social History   Tobacco Use  . Smoking status: Former Smoker    Types: Cigarettes  . Smokeless tobacco: Never Used  Vaping Use  . Vaping Use: Never used  Substance Use Topics  . Alcohol use: No  . Drug use: Never     Family Hx: The patient's family history includes Stroke in her son.  ROS:   Please see the history of present illness.     All other systems reviewed and are negative.   Prior CV studies:   The following studies were reviewed today:  August 15, 2020 echo personally reviewed Left ventricular function normal, 60% Severe concentric left ventricular hypertrophy Mildly reduced right ventricular function Moderately dilated left  atrium Severely dilated right atrium Small pericardial effusion posterior to the left ventricle Moderate mitral regurgitation  August 14, 2020 EKG personally reviewed shows atrial fibrillation with ventricular rate of 85 bpm Labs/Other Tests and Data Reviewed:    Recent Labs: 08/14/2020: ALT 16 08/18/2020: Magnesium 1.8 08/22/2020: BUN 14; Creatinine, Ser 1.17; Potassium 4.2; Sodium 139 08/23/2020: Hemoglobin 9.4; Platelets 254   Recent Lipid Panel Lab Results  Component Value Date/Time   CHOL 132 08/14/2020 11:00 AM   CHOL 168 06/09/2020 01:27 PM   TRIG 71 08/14/2020 11:00 AM   HDL 54 08/14/2020 11:00 AM   HDL 62 06/09/2020 01:27 PM   CHOLHDL 2.4 08/14/2020 11:00 AM   LDLCALC 64 08/14/2020 11:00 AM   LDLCALC 92 06/09/2020 01:27 PM    Wt Readings from Last 3 Encounters:  08/14/20 130 lb (59 kg)  08/11/20 142 lb 3.2 oz (64.5 kg)  06/09/20 141 lb (64 kg)     Risk Assessment/Calculations:     CHA2DS2-VASc Score = 7  This indicates a 11.2% annual  risk of stroke. The patient's score is based upon: CHF History: No HTN History: Yes Diabetes History: No Stroke History: Yes Vascular Disease History: Yes Age Score: 2 Gender Score: 1     Objective:    Vital Signs:  There were no vitals taken for this visit.   VITAL SIGNS:  reviewed PSYCH:  normal affect  ASSESSMENT & PLAN:    1. permanent atrial fibrillation Ms. Lightsey is an 87 year old woman with permanent atrial fibrillation and a history of recurrent severe GI bleeding requiring blood cell transfusions.  She also has a history of stroke after stopping anticoagulation in the past.  Given her history the patient and the family are seeking a strategy to help reduce the risk of stroke while avoiding long-term anticoagulant use.  Currently she is taking Eliquis and tolerating it without significant bleeding but she is concerned given her history that she could have another recurrent severe bleed.  I have reviewed the  watchman procedure in detail with the patient and her family.  Based on my chart review of the patient, I think she would be a good candidate for the watchman implant.  I would use a strategy post implant of Eliquis 2.5 mg twice daily.  At the 45-day mark and after repeat imaging, would plan to transition to Plavix monotherapy to complete 6 months total anticoagulation.  This regimen was discussed with the patient and her family over the phone today.  The patient and her family would like some time to think the procedure over and discuss it with her primary care physician with whom they are scheduled to meet soon.  I will plan on seeing her back in person in about 4 weeks to discuss the procedure again and to see if there are any questions from the patient or family.  I have seen Amber Barnett in the office today who is being considered for a Watchman left atrial appendage closure device. I believe they will benefit from this procedure given their history of atrial fibrillation, CHA2DS2-VASc score of 7 and unadjusted ischemic stroke rate of 11.2% per year. Unfortunately, the patient is not felt to be a long term anticoagulation candidate secondary to severe recurrent GI bleeding. The patient's chart has been reviewed and I feel that they would be a candidate for short term oral anticoagulation after Watchman implant.   It is my belief that after undergoing a LAA closure procedure, Amber Barnett will not need long term anticoagulation which eliminates anticoagulation side effects and major bleeding risk.   Procedural risks for the Watchman implant have been reviewed with the patient including a 0.5% risk of stroke, <1% risk of perforation and <1% risk of device embolization.    The published clinical data on the safety and effectiveness of WATCHMAN include but are not limited to the following: - Holmes DR, Mechele Claude, Sick P et al. for the PROTECT AF Investigators. Percutaneous closure  of the left atrial appendage versus warfarin therapy for prevention of stroke in patients with atrial fibrillation: a randomised non-inferiority trial. Lancet 2009; 374: 534-42. Mechele Claude, Doshi SK, Abelardo Diesel D et al. on behalf of the PROTECT AF Investigators. Percutaneous Left Atrial Appendage Closure for Stroke Prophylaxis in Patients With Atrial Fibrillation 2.3-Year Follow-up of the PROTECT AF (Watchman Left Atrial Appendage System for Embolic Protection in Patients With Atrial Fibrillation) Trial. Circulation 2013; 127:720-729. - Alli O, Doshi S,  Kar S, Reddy VY, Sievert H et al. Quality of Life Assessment in  the Randomized PROTECT AF (Percutaneous Closure of the Left Atrial Appendage Versus Warfarin Therapy for Prevention of Stroke in Patients With Atrial Fibrillation) Trial of Patients at Risk for Stroke With Nonvalvular Atrial Fibrillation. J Am Coll Cardiol 2013; [id number]. Vertell Limber DR, Tarri Abernethy, Price M, Alondra [place], Sievert H, Doshi S, Huber K, Reddy V. Prospective randomized evaluation of the Watchman left atrial appendage Device in patients with atrial fibrillation versus long-term warfarin therapy; the PREVAIL trial. Journal of the [employer] of Cardiology, Vol. 4, No. 1, 2014, 1-11. - Kar S, Doshi SK, Sadhu A, Horton R, Osorio J et al. Primary outcome evaluation of a next-generation left atrial appendage closure device: results from the PINNACLE FLX trial. Circulation 2021;143(18)1754-1762.    After today's visit with the patient which was dedicated solely for shared decision making visit regarding LAA closure device, the patient and her family would like some time to think the procedure over and discussed with her primary care physician.  I will plan on seeing her back in 4 weeks in person to answer any questions and to decide upon next steps.  If we elect to proceed with watchman implant, will need a CT skin of the chest to assess left atrial appendage anatomy.    2.  History  of GI bleeding See above.      Medication Adjustments/Labs and Tests Ordered: Current medicines are reviewed at length with the patient today.  Concerns regarding medicines are outlined above.   Tests Ordered: No orders of the defined types were placed in this encounter.    Medication Changes: No orders of the defined types were placed in this encounter.   Follow Up:  In Person in 4 week(s)  Signed, Vickie Epley, MD  09/07/2020 2:54 PM    [facility]   Time:   Today, I have spent 50 minutes with the patient with telehealth technology discussing the above problems.

## 2020-09-08 ENCOUNTER — Telehealth: Payer: Self-pay | Admitting: *Deleted

## 2020-09-08 NOTE — Telephone Encounter (Signed)
-----   Message from Garvin Fila, MD sent at 09/07/2020  5:06 PM EST ----- Kindly inform the patient that carotid ultrasound study shows no significant bilateral extracranial carotid stenosis on either side

## 2020-09-08 NOTE — Telephone Encounter (Signed)
Called and spoke with Amber Barnett, [place] (on DPR) about results per Dr. Clydene Fake note. She verbalized understanding and appreciation.

## 2020-09-14 ENCOUNTER — Telehealth: Payer: Self-pay | Admitting: Cardiology

## 2020-09-14 NOTE — Telephone Encounter (Signed)
New message   Pt niece would like a call to discuss her DX. She said pt doesn't only have a diagnosis of afib.

## 2020-09-14 NOTE — Telephone Encounter (Signed)
Call back returned to Pt's niece.  She wants to be sure that Dr. Quentin Ore understands Pt has multiple diagnosis.  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  Advised niece to discuss with Dr. Quentin Ore at upcoming scheduled appt.

## 2020-09-29 ENCOUNTER — Institutional Professional Consult (permissible substitution): Payer: Medicare HMO | Admitting: Cardiology

## 2020-10-14 ENCOUNTER — Institutional Professional Consult (permissible substitution): Payer: Medicare HMO | Admitting: Neurology

## 2020-10-19 ENCOUNTER — Ambulatory Visit: Payer: Medicare HMO | Admitting: Cardiology

## 2020-10-19 ENCOUNTER — Other Ambulatory Visit: Payer: Self-pay

## 2020-10-19 ENCOUNTER — Encounter: Payer: Self-pay | Admitting: Cardiology

## 2020-10-19 VITALS — BP 116/54 | HR 83 | Ht 66.0 in | Wt 143.0 lb

## 2020-10-19 DIAGNOSIS — I63412 Cerebral infarction due to embolism of left middle cerebral artery: Secondary | ICD-10-CM

## 2020-10-19 DIAGNOSIS — I4821 Permanent atrial fibrillation: Secondary | ICD-10-CM

## 2020-10-19 DIAGNOSIS — K55069 Acute infarction of intestine, part and extent unspecified: Secondary | ICD-10-CM

## 2020-10-19 DIAGNOSIS — Z7901 Long term (current) use of anticoagulants: Secondary | ICD-10-CM | POA: Diagnosis not present

## 2020-10-19 DIAGNOSIS — K922 Gastrointestinal hemorrhage, unspecified: Secondary | ICD-10-CM | POA: Diagnosis not present

## 2020-10-19 NOTE — Progress Notes (Signed)
Electrophysiology Office Follow up Visit Note:    Date:  10/19/2020   ID:  Amber Barnett, DOB 1934-01-05, MRN [id number]  PCP:  Emelia Loron  [facility] Cardiologist:  No primary care provider on file.  [facility] Electrophysiologist:  Vickie Epley, MD    Interval History:    Amber Barnett is a 87 y.o. female who presents for a follow up visit. I last saw the patient via telemedicine consult on 09/07/2020. Today she presents to clinic with her niece who is involved in her care. There have been no changes in her medical history since our last visit. Her niece did bring some outside records from a hospitalization in 2015. Discharge diagnoses from that hospitalization includes atrial fibrillation with RVR, mesenteric DVT, cystitis, gram-negative bacteremia, hyperparathyroidism and iron deficiency anemia. The patient tells me that she doesn't remember a lot about that hospitalization and doesn't remember why her physicians believed she had the mesenteric thrombosis. According to the note from 07/29/2014, she was to have lifelong anticoagulation for the mesenteric thrombosis.   Past Medical History:  Diagnosis Date  . Atrial fibrillation ([place])   . DVT (deep venous thrombosis) ([place])   . Dysrhythmia   . History of left breast cancer   . Hypertension   . Stroke [facility])     Past Surgical History:  Procedure Laterality Date  . COLONOSCOPY WITH PROPOFOL N/A 12/29/2019   Procedure: COLONOSCOPY WITH PROPOFOL;  Surgeon: Arta Silence, MD;  Location: [facility];  Service: Endoscopy;  Laterality: N/A;  . IR ANGIO INTRA EXTRACRAN SEL COM CAROTID INNOMINATE UNI R MOD SED  05/18/2017  . IR ANGIO VERTEBRAL SEL SUBCLAVIAN INNOMINATE UNI R MOD SED  05/18/2017  . IR PERCUTANEOUS ART THROMBECTOMY/INFUSION INTRACRANIAL INC DIAG ANGIO  05/18/2017  . IR RADIOLOGIST EVAL & MGMT  07/19/2017  . RADIOLOGY WITH ANESTHESIA N/A 05/18/2017   Procedure: RADIOLOGY WITH  ANESTHESIA;  Surgeon: Luanne Bras, MD;  Location: [place];  Service: Radiology;  Laterality: N/A;    Current Medications: Current Meds  Medication Sig  . albuterol (VENTOLIN HFA) 108 (90 Base) MCG/ACT inhaler Inhale 2 puffs into the lungs 4 (four) times daily as needed for wheezing or shortness of breath.  Marland Kitchen apixaban (ELIQUIS) 2.5 MG TABS tablet Take by mouth 2 (two) times daily.  Marland Kitchen atorvastatin (LIPITOR) 20 MG tablet Take 1 tablet (20 mg total) by mouth daily.  . Cholecalciferol (VITAMIN D3) 2000 units capsule Take 2,000 Units by mouth daily.  Marland Kitchen diltiazem (TIAZAC) 120 MG 24 hr capsule Take 120 mg by mouth daily.  . diphenhydrAMINE (BENADRYL) 25 MG tablet Take 25 mg by mouth as needed for itching or allergies.  . famotidine (PEPCID) 20 MG tablet Take 20 mg by mouth daily.  . Fe Fum-FA-B Cmp-C-Zn-Mg-Mn-Cu (HEMATINIC PLUS VIT/MINERALS) 106-1 MG TABS Take 1 tablet by mouth 2 (two) times daily.  . ferrous sulfate 325 (65 FE) MG tablet Take 1 tablet (325 mg total) by mouth 2 (two) times daily with a meal.  . furosemide (LASIX) 20 MG tablet Take 20 mg by mouth daily. MAY TAKE 2 TABLETS IF GAINS OVER 1.5 LB IN A DAY  . latanoprost (XALATAN) 0.005 % ophthalmic solution Place 1 drop into both eyes at bedtime.  Marland Kitchen LORazepam (ATIVAN) 1 MG tablet Take 0.5 tablets (0.5 mg total) by mouth every 8 (eight) hours as needed for anxiety.  Marland Kitchen MAGNESIUM-OXIDE 400 (241.3 Mg) MG tablet Take 800 mg by mouth daily.   . memantine (NAMENDA) 5  MG tablet Take 5 mg by mouth daily.  . pantoprazole (PROTONIX) 40 MG tablet Take 40 mg by mouth daily.  . potassium chloride (MICRO-K) 10 MEQ CR capsule Take 10 mEq by mouth daily.  . SYMBICORT 80-4.5 MCG/ACT inhaler Inhale 2 puffs into the lungs 2 (two) times daily.     Allergies:   Fish-derived products, Lactuca virosa, Milk-related compounds, Other, and Amoxicillin   Social History   Socioeconomic History  . Marital status: Widowed    Spouse name: Not on file  .  Number of children: Not on file  . Years of education: Not on file  . Highest education level: Not on file  Occupational History  . Occupation: Retired  Tobacco Use  . Smoking status: Former Smoker    Types: Cigarettes  . Smokeless tobacco: Never Used  Vaping Use  . Vaping Use: Never used  Substance and Sexual Activity  . Alcohol use: No  . Drug use: Never  . Sexual activity: Not on file  Other Topics Concern  . Not on file  Social History Narrative   Lives alone Independent Living, [facility] in [place]   Right Handed   Drinks no caffeine   Social Determinants of Health   Financial Resource Strain: Not on file  Food Insecurity: Not on file  Transportation Needs: Not on file  Physical Activity: Not on file  Stress: Not on file  Social Connections: Not on file     Family History: The patient's family history includes Stroke in her son.  ROS:   Please see the history of present illness.    All other systems reviewed and are negative.  EKGs/Labs/Other Studies Reviewed:    The following studies were reviewed today:   EKG:  The ekg ordered today demonstrates atrial fibrillation. LAFB  Recent Labs: 08/14/2020: ALT 16 08/18/2020: Magnesium 1.8 08/22/2020: BUN 14; Creatinine, Ser 1.17; Potassium 4.2; Sodium 139 08/23/2020: Hemoglobin 9.4; Platelets 254  Recent Lipid Panel    Component Value Date/Time   CHOL 132 08/14/2020 1100   CHOL 168 06/09/2020 1327   TRIG 71 08/14/2020 1100   HDL 54 08/14/2020 1100   HDL 62 06/09/2020 1327   CHOLHDL 2.4 08/14/2020 1100   VLDL 14 08/14/2020 1100   LDLCALC 64 08/14/2020 1100   LDLCALC 92 06/09/2020 1327    Physical Exam:    VS:  BP (!) 116/54   Pulse 83   Ht 5\' 6"  (1.676 m)   Wt 143 lb (64.9 kg)   SpO2 97%   BMI 23.08 kg/m     Wt Readings from Last 3 Encounters:  10/19/20 143 lb (64.9 kg)  08/14/20 130 lb (59 kg)  08/11/20 142 lb 3.2 oz (64.5 kg)     GEN:  Well nourished, well developed in no acute  distress. Appears younger than stated age. HEENT: Normal NECK: No JVD; No carotid bruits LYMPHATICS: No lymphadenopathy CARDIAC: irregularly irregular, no murmurs, rubs, gallops RESPIRATORY:  Clear to auscultation without rales, wheezing or rhonchi  ABDOMEN: Soft, non-tender, non-distended MUSCULOSKELETAL:  No edema; No deformity  SKIN: Warm and dry NEUROLOGIC:  Alert and oriented x 3 PSYCHIATRIC:  Normal affect   ASSESSMENT:    1. Permanent atrial fibrillation ([place])   2. Cerebral infarction due to embolism of left middle cerebral artery ([place])   3. Gastrointestinal hemorrhage, unspecified gastrointestinal hemorrhage type   4. Current use of long term anticoagulation   5. 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 Permanent atrial fibrillation Patient with long history of atrial fibrillation currently on apixaban 2.5mg  PO BID. This dose of AC is not the therapeutic dose of apixaban but given her history of severe GI bleeding, she is on a reduced dose in hopes of reducing her risk of stroke. She has a history of multiple CVA, likely 2/2 her AF. Stroke episodes have always occurred while off AC according to the patient's niece. Her situation is complex. Given her history of multiple strokes, age, gender, HTN she is at an extremely high risk of stroke. This is only partially being addressed by the low dose apixaban.  Her history of GI bleeds prevents uptitration of her anticoagulant. I previously had long discussions with the patient and her family about the Watchman procedure. This procedure would be one strategy to mitigate the stroke risk associated with her AF. It does not address her history of mesenteric thrombosis and would not prevent future DVT. This was discussed in detail with the patient and her niece today. For now, I think it is reasonable to continue the half dose anticoagulant with the clear understanding that this is not the "full strength"  dose for stroke prophylaxis. We discussed the possibility of pursuing LAAO if the patient develops future episodes of bleeding. They will also discuss the balancing of risks with the rest of her family. If the patient decides to pursue [place], patient will plan to give [place] a call to proceed with scheduling.  2. CVA hx Have previously occurred when off AC.   Medication Adjustments/Labs and Tests Ordered: Current medicines are reviewed at length with the patient today.  Concerns regarding medicines are outlined above.  Orders Placed This Encounter  Procedures  . EKG 12-Lead   No orders of the defined types were placed in this encounter.    Signed, Lars Mage, MD, [facility]  10/19/2020 8:41 PM    Electrophysiology [place]

## 2020-10-19 NOTE — Patient Instructions (Addendum)
Medication Instructions:  Your physician recommends that you continue on your current medications as directed. Please refer to the Current Medication list given to you today.  Labwork: None ordered.  Testing/Procedures: None ordered.  Follow-Up: Your physician wants you to follow-up in: as needed with Dr. Lambert.    Any Other Special Instructions Will Be Listed Below (If Applicable).  If you need a refill on your cardiac medications before your next appointment, please call your pharmacy.   

## 2020-12-22 ENCOUNTER — Encounter: Payer: Self-pay | Admitting: Neurology

## 2020-12-22 ENCOUNTER — Other Ambulatory Visit: Payer: Self-pay

## 2020-12-22 ENCOUNTER — Ambulatory Visit: Payer: Medicare HMO | Admitting: Neurology

## 2020-12-22 VITALS — BP 124/76 | HR 76 | Ht 66.0 in | Wt 149.8 lb

## 2020-12-22 DIAGNOSIS — R2689 Other abnormalities of gait and mobility: Secondary | ICD-10-CM

## 2020-12-22 DIAGNOSIS — I6990 Unspecified sequelae of unspecified cerebrovascular disease: Secondary | ICD-10-CM | POA: Diagnosis not present

## 2020-12-22 NOTE — Progress Notes (Signed)
[facility] [address] [place]. [place] [address] [phone]       OFFICE FOLLOW-UP NOTE  Ms. Antoria Lanza Date of Birth:  1934-03-07 Medical Record Number:  [id number]   HP Last visit 07/23/2017 ; Ms. Balash is a pleasant 87 year old African-American lady seen today for the first office follow-up visit following hospital admission for stroke in September 2018. She is accompanied by her daughter. History is obtained from them and review of electronic medical records. I have personally reviewed imaging films.Ms. Dorion presented with left sized gaze deviation, right hemiparesis, and aphasia witness by family. The patient admitted to not being compliant with taking the second dose of eliquis every day and consistently. On presentation head CT indicated distal M1 occlusive thrombus. CTA was performed and subsequent successful revascularization by Dr. Estanislado Pandy. Subsequent MRI head showed punctate infarct without significant residual stenosis. She had no significant resultant deficits and was discharged to home with her niece for supervision and outpatient PT recommended. On review of risk factors she was noted to report missing doses of Eliquis intermittently, including right before this admission. She remained in Afib throughout her evaluation. Eliquis was discontinued and Xarelto prescribed at discharge for hopefully easier treatment adherence. Patient was counseled to be compliant with Xarelto and to take it with the proper meal consistently at the same time every day. She voiced understanding. She states she has done very well since discharge. She has no residual neurological deficits. She is tolerating Xarelto well and consistently takes it with lunch and a proper meal. She did have minor nasal bleeding a few weeks ago following a cold but that seemed to have stopped. Her blood pressure is well controlled and today it is 127/73. She does not have a any  specific neurological complaints today. She's had no recurrent stroke or TIA symptoms. She is completely independent of all active disease of daily living.  Update 01/21/2018 : She returns for f/u after last visit 6 months ago. She is accompanied by her daughter. She continues to do well without any recurrent stroke or TIA symptoms. She did have some mild rectal bleeding on xarelto 20 mg daily which has stopped after her primary MD decreased the dose to 15 mg daily which she appears to be tolerating well. Her BP is well controled and is 136/79 today.  She denies any recurrent stroke or TIA symptoms.  In fact she has no new complaints today..  She continues to have swelling in her leg she saw she saw her cardiologist recently who increased the dose of amlodipine as she was found to have pulmonary hypertension.Marland Kitchen Update 06/10/2019 : She returns for follow-up after last visit more than a year ago.  She is accompanied by her niece.  Patient continues to do well she has had no recurrent stroke or TIA symptoms.  She remains on Xarelto which is tolerating well without bleeding or bruising.  Her blood pressure is usually well controlled today it is borderline at 148/66 in office.  She states that in the last couple of months she is noted some intermittent swallowing difficulties.  She often coughs out some phlegm.  She feels sometimes she is choking on her saliva.  She denies any pain.  Her appetite is good.  She denies weight loss.  She has not discussed this complaints with the primary care physician but plans to do so soon.  She is planning on having dental extraction and has questions about holding Xarelto in the stroke risk in  the periprocedure well.  She has no other new neurological complaints. 06/09/2020 : She returns for follow-up after last visit a year ago.  She is accompanied by her daughter today.  She states she is done well from the stroke standpoint without recurrent stroke or TIA symptoms.  She had a GI  bleeding in May 2021 and had to stop the Xarelto unfortunately she developed bilateral popliteal artery embolism requiring emergent bilateral popliteal embolectomies and surgery.  She is done well since then.  She is back on Xarelto and tolerating it well without bruising or bleeding.  She has recently moved into independent living.  She also had pleural effusion and is being followed at [facility] for pulmonary hypertension.  She has no new neurological complaints today.    Update 12/22/2020 : She returns for follow-up after recent hospitalization in December 2021 for another stroke.  She on presented 08/14/2020  with sudden onset of slurred speech and facial droop initially noted yesterday evening.  Patient was speaking to a friend noticed it but did not seek medical help.  This morning when the daughter talked to the patient she had obvious slurred speech and facial droop.  She denies any extremity weakness numbness gait or balance problems.  Patient was recently admitted on 08/11/2020 with dark-colored stools which were felt to be diverticular bleed.  Xarelto was held and she was discharged yesterday with plans to resume Xarelto today which is on heparin so far.  She has undergone CT scan of the head noncontrast study in the ER which I personally reviewed showed low-density in the right insula consistent with an acute infarct she also underwent CT angiogram of brain and neck which shows Right M2 branch occlusion/pre occlusion with faint reconstitution and then subsequent reocclusion at the M3 level,correlating with the level of infarct. Regional M3/4 branch is appear asymmetrically irregular on MIPS.  IV tPA was not considered given recent lower GI bleeding.  Mechanical thrombectomy was also not considered as NIH stroke scale was only for an the M2 occlusion was felt to be too distal for thrombectomy.  LDL cholesterol 64 mg percent.  Hemoglobin A1c was 5.3.  2D echo showed normal ejection fraction of 60 to 65%.   Left atrial size is moderately dilated.  Carotid ultrasound on 08/30/2020 showed no evidence of significant extracranial carotid stenosis.  Patient states she has done well since discharge.  She was anticoagulation with was held initially for a week or so and then subsequently she was restarted on Eliquis 2.5 twice daily which she seems to be tolerating well without any further bleeding and stable hematocrit.  She is currently living in assisted living.  Her speech seems to have improved and occasionally she has some slurred speech.  Her balance is yet off and she has been using a cane.  She uses walker for long distances.  She is unsteady but she has had no falls or injuries.  Blood pressures well controlled and today it is 124/76.  She recently saw Dr. 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 ROS:   14 system review of systems is positive for GI bleeding, lower extremity ischemia, speech difficulty, dysarthria, and imbalance, gait difficulty and all other systems negative PMH:  Past Medical History:  Diagnosis Date  . Atrial fibrillation ([place])   . DVT (deep venous thrombosis) ([place])   . Dysrhythmia   . History  of left breast cancer   . Hypertension   . Stroke [facility])     Social History:  Social History   Socioeconomic History  . Marital status: Widowed    Spouse name: Not on file  . Number of children: Not on file  . Years of education: Not on file  . Highest education level: Not on file  Occupational History  . Occupation: Retired  Tobacco Use  . Smoking status: Former Smoker    Types: Cigarettes  . Smokeless tobacco: Never Used  Vaping Use  . Vaping Use: Never used  Substance and Sexual Activity  . Alcohol use: No  . Drug use: Never  . Sexual activity: Not on file  Other Topics Concern  . Not on file  Social History Narrative   Lives  alone Independent Living, [facility] in [place]   Right Handed   Drinks no caffeine   Social Determinants of Health   Financial Resource Strain: Not on file  Food Insecurity: Not on file  Transportation Needs: Not on file  Physical Activity: Not on file  Stress: Not on file  Social Connections: Not on file  Intimate Partner Violence: Not on file    Medications:   Current Outpatient Medications on File Prior to Visit  Medication Sig Dispense Refill  . albuterol (VENTOLIN HFA) 108 (90 Base) MCG/ACT inhaler Inhale 2 puffs into the lungs 4 (four) times daily as needed for wheezing or shortness of breath.    Marland Kitchen apixaban (ELIQUIS) 2.5 MG TABS tablet Take by mouth 2 (two) times daily.    Marland Kitchen atorvastatin (LIPITOR) 20 MG tablet Take 1 tablet (20 mg total) by mouth daily. 30 tablet 11  . Cholecalciferol (VITAMIN D3) 2000 units capsule Take 2,000 Units by mouth daily.    Marland Kitchen diltiazem (TIAZAC) 120 MG 24 hr capsule Take 120 mg by mouth daily.  6  . diphenhydrAMINE (BENADRYL) 25 MG tablet Take 25 mg by mouth as needed for itching or allergies.    . famotidine (PEPCID) 20 MG tablet Take 20 mg by mouth daily.    . Fe Fum-FA-B Cmp-C-Zn-Mg-Mn-Cu (HEMATINIC PLUS VIT/MINERALS) 106-1 MG TABS Take 1 tablet by mouth 2 (two) times daily.    . ferrous sulfate 325 (65 FE) MG tablet Take 1 tablet (325 mg total) by mouth 2 (two) times daily with a meal. 60 tablet 3  . furosemide (LASIX) 20 MG tablet Take 20 mg by mouth daily. MAY TAKE 2 TABLETS IF GAINS OVER 1.5 LB IN A DAY    . latanoprost (XALATAN) 0.005 % ophthalmic solution Place 1 drop into both eyes at bedtime.    Marland Kitchen LORazepam (ATIVAN) 1 MG tablet Take 0.5 tablets (0.5 mg total) by mouth every 8 (eight) hours as needed for anxiety.  0  . MAGNESIUM-OXIDE 400 (241.3 Mg) MG tablet Take 800 mg by mouth daily.     . memantine (NAMENDA) 5 MG tablet Take 5 mg by mouth daily.    . pantoprazole (PROTONIX) 40 MG tablet Take 40 mg by mouth daily.  2  . potassium  chloride (MICRO-K) 10 MEQ CR capsule Take 10 mEq by mouth daily.    . SYMBICORT 80-4.5 MCG/ACT inhaler Inhale 2 puffs into the lungs 2 (two) times daily.     No current facility-administered medications on file prior to visit.    Allergies:   Allergies  Allergen Reactions  . Fish-Derived Products Swelling    Facial swelling then tongue swelling  . Lactuca Virosa Diarrhea  .  Milk-Related Compounds Diarrhea    WHOLE MILK  . Other Swelling    Allergy to all nuts - facial swelling, then tongue swelling  . Amoxicillin Nausea And Vomiting    Physical Exam General: well developed, well nourished pleasant elderly African-American lady, seated, in no evident distress Head: head normocephalic and atraumatic.  Neck: supple with no carotid or supraclavicular bruits Cardiovascular: regular rate and rhythm, no murmurs Musculoskeletal: no deformity Skin:  no rash/petichiae bilateral lower extremity surgical scars from popliteal artery thrombectomies Vascular:  Normal pulses all extremities Vitals:   12/22/20 0924  BP: 124/76  Pulse: 76   Neurologic Exam Mental Status: Awake and fully alert. Oriented to place and time. Recent and remote memory intact. Attention span, concentration and fund of knowledge appropriate. Mood and affect appropriate.  Speech is fluent without aphasia.  Mild dysarthria. Cranial Nerves: Fundoscopic exam not done Pupils equal, briskly reactive to light. Extraocular movements full without nystagmus. Visual fields full to confrontation. Hearing intact. Facial sensation intact.  Mild right nasolabial asymmetry when she smiles., tongue, palate moves normally and symmetrically.  Motor: Normal bulk and tone. Normal strength in all tested extremity muscles. Diminished fine finger movements on the right. Orbits left over right upper extremity. Sensory.: intact to touch ,pinprick .position and vibratory sensation.  Coordination: Rapid alternating movements normal in all  extremities. Finger-to-nose and heel-to-shin performed accurately bilaterally. Gait and Station: Arises from chair without difficulty. Stance is normal.  Uses a cane for ambulation gait is cautious but stable.. Able to heel, toe and tandem walk with great difficulty.  Reflexes: 1+ and symmetric. Toes downgoing.   NIHSS 2 Modified Rankin score 2   ASSESSMENT: 87 year old lady with embolic left MCA infarct and September 2018 secondary to left middle cerebral artery occlusion due to atrial fibrillation treated with mechanical embolectomy with complete recanalization and excellent clinical recovery.  She had a GI bleed in May 2021 and while Xarelto was held developed significant Bilateral popliteal artery embolisms: Status post bilateral popliteal artery thrombectomies, following with vascular surgery..  Recent left MCA branch infarct in December 2021 due to left M2 occlusion in the setting of anticoagulation being on hold due to GI bleeding     PLAN: I had a long d/w patient about her recent stroke, GI bleed on anticoagulation, risk for recurrent stroke/TIAs, personally independently reviewed imaging studies and stroke evaluation results and answered questions.Continue Eliquis 2.5 mg twice daily for secondary stroke prevention given recent history of GI bleeding on full dose anticoagulation and maintain strict control of hypertension with blood pressure goal below 130/90, diabetes with hemoglobin A1c goal below 6.5% and lipids with LDL cholesterol goal below 70 mg/dL. I also advised the patient to eat a healthy diet with plenty of whole grains, cereals, fruits and vegetables, exercise regularly and maintain ideal body weight .she was encouraged to use a cane at all times.  We also discussed fall prevention precautions.  Referral for physical therapy to improve gait and balance.  Followup in the future with my nurse practitioner Janett Billow in 6 months or call earlier if necessary.. Greater than 50% of time  during this 25 minute visit was spent on counseling,explanation of diagnosis of embolic stroke and atrial fibrillation, planning of further management, discussion with patient and family and coordination of care Antony Contras, MD Note: This document was prepared with digital dictation and possible smart phrase technology. Any transcriptional errors that result from this process are unintentional

## 2020-12-22 NOTE — Patient Instructions (Signed)
I had a long d/w patient about her recent stroke, GI bleed on anticoagulation, risk for recurrent stroke/TIAs, personally independently reviewed imaging studies and stroke evaluation results and answered questions.Continue Eliquis 2.5 mg twice daily for secondary stroke prevention given recent history of GI bleeding on full dose anticoagulation and maintain strict control of hypertension with blood pressure goal below 130/90, diabetes with hemoglobin A1c goal below 6.5% and lipids with LDL cholesterol goal below 70 mg/dL. I also advised the patient to eat a healthy diet with plenty of whole grains, cereals, fruits and vegetables, exercise regularly and maintain ideal body weight .she was encouraged to use a cane at all times.  We also discussed fall prevention precautions.  Referral for physical therapy to improve gait and balance.  Followup in the future with my nurse practitioner Janett Billow in 6 months or call earlier if necessary.  Fall Prevention in the Home, Adult Falls can cause injuries and can happen to people of all ages. There are many things you can do to make your home safe and to help prevent falls. Ask for help when making these changes. What actions can I take to prevent falls? General Instructions  Use good lighting in all rooms. Replace any light bulbs that burn out.  Turn on the lights in dark areas. Use night-lights.  Keep items that you use often in easy-to-reach places. Lower the shelves around your home if needed.  Set up your furniture so you have a clear path. Avoid moving your furniture around.  Do not have throw rugs or other things on the floor that can make you trip.  Avoid walking on wet floors.  If any of your floors are uneven, fix them.  Add color or contrast paint or tape to clearly mark and help you see: ? Grab bars or handrails. ? First and last steps of staircases. ? Where the edge of each step is.  If you use a stepladder: ? Make sure that it is fully  opened. Do not climb a closed stepladder. ? Make sure the sides of the stepladder are locked in place. ? Ask someone to hold the stepladder while you use it.  Know where your pets are when moving through your home. What can I do in the bathroom?  Keep the floor dry. Clean up any water on the floor right away.  Remove soap buildup in the tub or shower.  Use nonskid mats or decals on the floor of the tub or shower.  Attach bath mats securely with double-sided, nonslip rug tape.  If you need to sit down in the shower, use a plastic, nonslip stool.  Install grab bars by the toilet and in the tub and shower. Do not use towel bars as grab bars.      What can I do in the bedroom?  Make sure that you have a light by your bed that is easy to reach.  Do not use any sheets or blankets for your bed that hang to the floor.  Have a firm chair with side arms that you can use for support when you get dressed. What can I do in the kitchen?  Clean up any spills right away.  If you need to reach something above you, use a step stool with a grab bar.  Keep electrical cords out of the way.  Do not use floor polish or wax that makes floors slippery. What can I do with my stairs?  Do not leave any items on the  stairs.  Make sure that you have a light switch at the top and the bottom of the stairs.  Make sure that there are handrails on both sides of the stairs. Fix handrails that are broken or loose.  Install nonslip stair treads on all your stairs.  Avoid having throw rugs at the top or bottom of the stairs.  Choose a carpet that does not hide the edge of the steps on the stairs.  Check carpeting to make sure that it is firmly attached to the stairs. Fix carpet that is loose or worn. What can I do on the outside of my home?  Use bright outdoor lighting.  Fix the edges of walkways and driveways and fix any cracks.  Remove anything that might make you trip as you walk through a door,  such as a raised step or threshold.  Trim any bushes or trees on paths to your home.  Check to see if handrails are loose or broken and that both sides of all steps have handrails.  Install guardrails along the edges of any raised decks and porches.  Clear paths of anything that can make you trip, such as tools or rocks.  Have leaves, snow, or ice cleared regularly.  Use sand or salt on paths during winter.  Clean up any spills in your garage right away. This includes grease or oil spills. What other actions can I take?  Wear shoes that: ? Have a low heel. Do not wear high heels. ? Have rubber bottoms. ? Feel good on your feet and fit well. ? Are closed at the toe. Do not wear open-toe sandals.  Use tools that help you move around if needed. These include: ? Canes. ? Walkers. ? Scooters. ? Crutches.  Review your medicines with your doctor. Some medicines can make you feel dizzy. This can increase your chance of falling. Ask your doctor what else you can do to help prevent falls. Where to find more information  Centers for Disease Control and Prevention, STEADI: http://www.wolf.info/  National Institute on Aging: http://kim-miller.com/ Contact a doctor if:  You are afraid of falling at home.  You feel weak, drowsy, or dizzy at home.  You fall at home. Summary  There are many simple things that you can do to make your home safe and to help prevent falls.  Ways to make your home safe include removing things that can make you trip and installing grab bars in the bathroom.  Ask for help when making these changes in your home. This information is not intended to replace advice given to you by your health care provider. Make sure you discuss any questions you have with your health care provider. Document Revised: 03/10/2020 Document Reviewed: 03/10/2020 Elsevier Patient Education  [place].

## 2020-12-23 ENCOUNTER — Telehealth: Payer: Self-pay | Admitting: Neurology

## 2020-12-23 NOTE — Telephone Encounter (Signed)
Faxed referral to [facility] PT. Phone: [phone]. Fax: [phone].

## 2021-01-10 NOTE — Telephone Encounter (Signed)
Pt's niece, Wellsite geologist (on [place]) called, she would like the therapist to come to her home. How would I go about getting that changed. Would like a call back.

## 2021-01-11 NOTE — Telephone Encounter (Signed)
I called Amber Barnett back to let her know that I spoke with [facility]. Advised that someone with Amber Barnett will contact them to set everything up.

## 2021-01-11 NOTE — Telephone Encounter (Signed)
I spoke with Amber Barnett regarding referral. She told me that the patient has previously done in-home physical therapy, but did not know the exact name of the company. She provided me with the name Amber Barnett and a number to call [phone]). She states she has already attempted to reach out to him regarding patient's previous PT, unsure if this is a current number. I called and left a brief message stating who I am and where I am calling from, did not leave any patient information. Requested a call back.

## 2021-01-11 NOTE — Telephone Encounter (Signed)
Amber Barnett with Amber Barnett returned my call. He states he has switched rolls in the company but can help me with this patient. He advised me to fax the order as well as the current med list, etc to [facility] @ [phone]. I faxed the order after our call.

## 2021-01-13 ENCOUNTER — Telehealth: Payer: Self-pay | Admitting: Neurology

## 2021-01-13 NOTE — Telephone Encounter (Signed)
PT Amber Barnett(with [facility]) has called for verbal orders 2 times a week for 4 weeks and 1 time a week for 2 weeks.  Also Gerald Stabs mentioned drug interaction level 2: potassium chloride (MICRO-K) 10 MEQ CR capsule & diphenhydrAMINE (BENADRYL) 25 MG tablet

## 2021-01-18 NOTE — Telephone Encounter (Signed)
Returned call to [employer].  LM on voicemail okaying verbal orders as above.

## 2021-01-24 NOTE — Telephone Encounter (Signed)
Okay thanks agree

## 2021-06-06 ENCOUNTER — Telehealth: Payer: Self-pay | Admitting: Neurology

## 2021-06-06 NOTE — Telephone Encounter (Signed)
Pt's niece, Wellsite geologist called, need a morning appt due to work schedule. Would like a call from the nurse.

## 2021-06-06 NOTE — Telephone Encounter (Signed)
Per last office note, Dr. Leonie Man wanted this patient to follow up w/ Janett Billow.  We will work with the patient to provide a new appt date.

## 2021-06-15 NOTE — Telephone Encounter (Signed)
Rescheduled patient's appt for 07/19/21 at 9:45a

## 2021-06-15 NOTE — Telephone Encounter (Signed)
Rescheduled patient's appt 09/26/2021 at 8:15a. Would like a sooner morning appt with Janett Billow if possible.

## 2021-06-15 NOTE — Telephone Encounter (Signed)
Can you please call back and offer 07/19/21 at 9:45am w/ Janett Billow?

## 2021-06-20 ENCOUNTER — Ambulatory Visit: Payer: Medicare HMO | Admitting: Neurology

## 2021-07-19 ENCOUNTER — Encounter: Payer: Self-pay | Admitting: Adult Health

## 2021-07-19 ENCOUNTER — Other Ambulatory Visit: Payer: Self-pay

## 2021-07-19 ENCOUNTER — Ambulatory Visit: Payer: Medicare HMO | Admitting: Adult Health

## 2021-07-19 VITALS — BP 138/68 | HR 76 | Ht 66.0 in | Wt 160.0 lb

## 2021-07-19 DIAGNOSIS — I63512 Cerebral infarction due to unspecified occlusion or stenosis of left middle cerebral artery: Secondary | ICD-10-CM

## 2021-07-19 DIAGNOSIS — R2689 Other abnormalities of gait and mobility: Secondary | ICD-10-CM | POA: Diagnosis not present

## 2021-07-19 NOTE — Progress Notes (Signed)
[facility] [address] [place]. [place] [address] [phone]       OFFICE FOLLOW-UP NOTE  [name] Date of Birth:  January 31, 1934 Medical Record Number:  [id number]    Primary neurologist: Dr. Leonie Man Reason for visit: Stroke follow-up  Chief Complaint  Patient presents with   Follow-up    RM 3 with Niece Delgrachia Pt is well and stable, no new concerns       HP   07/23/2017 Dr. Leonie Man; [name] is a pleasant 87 year old African-American lady seen today for the first office follow-up visit following hospital admission for stroke in September 2018. She is accompanied by her daughter. History is obtained from them and review of electronic medical records. I have personally reviewed imaging films.[name] presented with left sized gaze deviation, right hemiparesis, and aphasia witness by family. The patient admitted to not being compliant with taking the second dose of eliquis every day and consistently. On presentation head CT indicated distal M1 occlusive thrombus. CTA was performed and subsequent successful revascularization by Dr. Estanislado Pandy. Subsequent MRI head showed punctate infarct without significant residual stenosis. She had no significant resultant deficits and was discharged to home with her niece for supervision and outpatient PT recommended. On review of risk factors she was noted to report missing doses of Eliquis intermittently, including right before this admission. She remained in Afib throughout her evaluation. Eliquis was discontinued and Xarelto prescribed at discharge for hopefully easier treatment adherence. Patient was counseled to be compliant with Xarelto and to take it with the proper meal consistently at the same time every day. She voiced understanding. She states she has done very well since discharge. She has no residual neurological deficits. She is tolerating Xarelto well and consistently takes it with lunch and  a proper meal. She did have minor nasal bleeding a few weeks ago following a cold but that seemed to have stopped. Her blood pressure is well controlled and today it is 127/73. She does not have a any specific neurological complaints today. She's had no recurrent stroke or TIA symptoms. She is completely independent of all active disease of daily living.  Update 01/21/2018 Dr. Leonie Man: She returns for f/u after last visit 6 months ago. She is accompanied by her daughter. She continues to do well without any recurrent stroke or TIA symptoms. She did have some mild rectal bleeding on xarelto 20 mg daily which has stopped after her primary MD decreased the dose to 15 mg daily which she appears to be tolerating well. Her BP is well controled and is 136/79 today.  She denies any recurrent stroke or TIA symptoms.  In fact she has no new complaints today..  She continues to have swelling in her leg she saw she saw her cardiologist recently who increased the dose of amlodipine as she was found to have pulmonary hypertension.Marland Kitchen Update 06/10/2019 Dr. Leonie Man: She returns for follow-up after last visit more than a year ago.  She is accompanied by her niece.  Patient continues to do well she has had no recurrent stroke or TIA symptoms.  She remains on Xarelto which is tolerating well without bleeding or bruising.  Her blood pressure is usually well controlled today it is borderline at 148/66 in office.  She states that in the last couple of months she is noted some intermittent swallowing difficulties.  She often coughs out some phlegm.  She feels sometimes she is choking on her saliva.  She denies any pain.  Her  appetite is good.  She denies weight loss.  She has not discussed this complaints with the primary care physician but plans to do so soon.  She is planning on having dental extraction and has questions about holding Xarelto in the stroke risk in the periprocedure well.  She has no other new neurological  complaints. 06/09/2020 Dr. Leonie Man: She returns for follow-up after last visit a year ago.  She is accompanied by her daughter today.  She states she is done well from the stroke standpoint without recurrent stroke or TIA symptoms.  She had a GI bleeding in May 2021 and had to stop the Xarelto unfortunately she developed bilateral popliteal artery embolism requiring emergent bilateral popliteal embolectomies and surgery.  She is done well since then.  She is back on Xarelto and tolerating it well without bruising or bleeding.  She has recently moved into independent living.  She also had pleural effusion and is being followed at [facility] for pulmonary hypertension.  She has no new neurological complaints today.    Update 12/22/2020 Dr. Leonie Man: She returns for follow-up after recent hospitalization in December 2021 for another stroke.  She on presented 08/14/2020  with sudden onset of slurred speech and facial droop initially noted yesterday evening.  Patient was speaking to a friend noticed it but did not seek medical help.  This morning when the daughter talked to the patient she had obvious slurred speech and facial droop.  She denies any extremity weakness numbness gait or balance problems.  Patient was recently admitted on 08/11/2020 with dark-colored stools which were felt to be diverticular bleed.  Xarelto was held and she was discharged yesterday with plans to resume Xarelto today which is on heparin so far.  She has undergone CT scan of the head noncontrast study in the ER which I personally reviewed showed low-density in the right insula consistent with an acute infarct she also underwent CT angiogram of brain and neck which shows Right M2 branch occlusion/pre occlusion with faint reconstitution and then subsequent reocclusion at the M3 level,correlating with the level of infarct. Regional M3/4 branch is appear asymmetrically irregular on MIPS.  IV tPA was not considered given recent lower GI bleeding.   Mechanical thrombectomy was also not considered as NIH stroke scale was only for an the M2 occlusion was felt to be too distal for thrombectomy.  LDL cholesterol 64 mg percent.  Hemoglobin A1c was 5.3.  2D echo showed normal ejection fraction of 60 to 65%.  Left atrial size is moderately dilated.  Carotid ultrasound on 08/30/2020 showed no evidence of significant extracranial carotid stenosis.  Patient states she has done well since discharge.  She was anticoagulation with was held initially for a week or so and then subsequently she was restarted on Eliquis 2.5 twice daily which she seems to be tolerating well without any further bleeding and stable hematocrit.  She is currently living in assisted living.  Her speech seems to have improved and occasionally she has some slurred speech.  Her balance is yet off and she has been using a cane.  She uses walker for long distances.  She is unsteady but she has had no falls or injuries.  Blood pressures well controlled and today it is 124/76.  She recently saw Dr. 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   Update 07/19/2021 JM: Returns for 67-month stroke follow-up  accompanied by her niece   Stable from stroke standpoint -denies new stroke/TIA symptoms.   Reports residual continued imbalance - she completed [place] therapy but feels like her balance could further improve. She is currently using a cane but does feel more stable with use of RW -denies any recent falls. Denies any residual speech language difficulties Continues to reside at [facility] in [place], [place]  Compliant on Eliquis 2.5 mg twice daily -denies side effects and no additional bleeding Compliant on atorvastatin -denies side effects Blood pressure today 138/68 - monitors at home which has been stable  Routinely follows with PCP every 2 months - recent labs  11/7 which was good per niece (unable to view via epic) Routinely followed by cardiology and VVS thru [place]  No further concerns at this time     ROS:   14 system review of systems is positive for those listed in HPI and all other systems negative   PMH:  Past Medical History:  Diagnosis Date   Atrial fibrillation ([place])    DVT (deep venous thrombosis) ([place])    Dysrhythmia    History of left breast cancer    Hypertension    Stroke [facility])     Social History:  Social History   Socioeconomic History   Marital status: Widowed    Spouse name: Not on file   Number of children: Not on file   Years of education: Not on file   Highest education level: Not on file  Occupational History   Occupation: Retired  Tobacco Use   Smoking status: Former    Types: Cigarettes   Smokeless tobacco: Never  Scientific laboratory technician Use: Never used  Substance and Sexual Activity   Alcohol use: No   Drug use: Never   Sexual activity: Not on file  Other Topics Concern   Not on file  Social History Narrative   Lives alone Independent Living, [facility] in [place] no caffeine   Social Determinants of Health   Financial Resource Strain: Not on file  Food Insecurity: Not on file  Transportation Needs: Not on file  Physical Activity: Not on file  Stress: Not on file  Social Connections: Not on file  Intimate Partner Violence: Not on file    Medications:   Current Outpatient Medications on File Prior to Visit  Medication Sig Dispense Refill   albuterol (VENTOLIN HFA) 108 (90 Base) MCG/ACT inhaler Inhale 2 puffs into the lungs 4 (four) times daily as needed for wheezing or shortness of breath.     apixaban (ELIQUIS) 2.5 MG TABS tablet Take by mouth 2 (two) times daily.     atorvastatin (LIPITOR) 20 MG tablet Take 1 tablet (20 mg total) by mouth daily. 30 tablet 11   Cholecalciferol (VITAMIN D3) 2000 units capsule Take 2,000 Units by mouth daily.      diltiazem (TIAZAC) 120 MG 24 hr capsule Take 120 mg by mouth daily.  6   diphenhydrAMINE (BENADRYL) 25 MG tablet Take 25 mg by mouth as needed for itching or allergies.     famotidine (PEPCID) 20 MG tablet Take 20 mg by mouth daily.     Fe Fum-FA-B Cmp-C-Zn-Mg-Mn-Cu (HEMATINIC PLUS VIT/MINERALS) 106-1 MG TABS Take 1 tablet by mouth 2 (two) times daily.     furosemide (LASIX) 20 MG tablet Take 20 mg by mouth daily. MAY TAKE 2 TABLETS IF GAINS OVER 1.5 LB IN A DAY  latanoprost (XALATAN) 0.005 % ophthalmic solution Place 1 drop into both eyes at bedtime.     LORazepam (ATIVAN) 1 MG tablet Take 0.5 tablets (0.5 mg total) by mouth every 8 (eight) hours as needed for anxiety.  0   MAGNESIUM-OXIDE 400 (241.3 Mg) MG tablet Take 800 mg by mouth daily.      memantine (NAMENDA) 5 MG tablet Take 5 mg by mouth daily.     pantoprazole (PROTONIX) 40 MG tablet Take 40 mg by mouth daily.  2   potassium chloride (MICRO-K) 10 MEQ CR capsule Take 10 mEq by mouth 2 (two) times daily.     SYMBICORT 80-4.5 MCG/ACT inhaler Inhale 2 puffs into the lungs 2 (two) times daily.     No current facility-administered medications on file prior to visit.    Allergies:   Allergies  Allergen Reactions   Fish-Derived Products Swelling    Facial swelling then tongue swelling   Lactuca Virosa Diarrhea   Milk-Related Compounds Diarrhea    WHOLE MILK   Other Swelling    Allergy to all nuts - facial swelling, then tongue swelling   Amoxicillin Nausea And Vomiting    Physical Exam Today's Vitals   07/19/21 0942  BP: 138/68  Pulse: 76  Weight: 160 lb (72.6 kg)  Height: 5\' 6"  (1.676 m)   Body mass index is 25.82 kg/m.   General: well developed, well nourished pleasant elderly African-American lady, seated, in no evident distress Head: head normocephalic and atraumatic.  Neck: supple with no carotid or supraclavicular bruits Cardiovascular: regular rate and rhythm, no murmurs Musculoskeletal: no deformity Skin:   no rash/petichiae  Vascular:  Normal pulses all extremities  Neurologic Exam Mental Status: Awake and fully alert. Oriented to place and time. Recent and remote memory intact. Attention span, concentration and fund of knowledge appropriate. Mood and affect appropriate.  Fluent speech and language. Cranial Nerves: Pupils equal, briskly reactive to light. Extraocular movements full without nystagmus. Visual fields full to confrontation. Hearing intact. Facial sensation intact.  Mild right nasolabial asymmetry when she smiles., tongue, palate moves normally and symmetrically.  Motor: Normal bulk and tone. Normal strength in all tested extremity muscles.  Sensory.: intact to touch ,pinprick .position and vibratory sensation.  Coordination: Rapid alternating movements normal in all extremities. Finger-to-nose and heel-to-shin performed accurately bilaterally. Gait and Station: Arises from chair without difficulty. Stance is normal.  Uses a cane for ambulation gait is cautious but stable.  Mild imbalance greater with turns. Did not attempt heel, toe and tandem walk  Reflexes: 1+ and symmetric. Toes downgoing.     ASSESSMENT: 87 year old lady with embolic left MCA infarct in September 2018 secondary to left middle cerebral artery occlusion due to atrial fibrillation treated with mechanical embolectomy with complete recanalization and excellent clinical recovery.  She had a GI bleed in May 2021 and while Xarelto was held -- developed significant Bilateral popliteal artery embolisms s/p bilateral popliteal artery thrombectomies, following with vascular surgery.  Recurrent left MCA branch infarct in December 2021 due to left M2 occlusion in the setting of anticoagulation being on hold due to GI bleeding    PLAN:  -continued imbalance - referral placed for [facility] PT as requested - advised use of cane/RW at all times for fall prevention -Continue Eliquis 2.5 mg twice daily for secondary stroke prevention given  recent history of GI bleeding on full dose anticoagulation and atorvastatin 20 mg daily for secondary stroke prevention -Continue routine follow-up with cardiology, VVS and PCP as scheduled -  goals: HTN with blood pressure goal below 130/90 and HLD with LDL cholesterol goal below 70 mg/dL.  -all medications managed by PCP/cardiology   Follow up in 1 year or call earlier if needed   CC:  Bulla, Donald, PA-C   I spent 32 minutes of face-to-face and non-face-to-face time with patient and niece.  This included previsit chart review, lab review, study review, order entry, electronic health record documentation, patient and niece education regarding history of prior strokes and residual deficits, secondary stroke prevention measures and aggressive stroke risk factor management and answered all the questions to patient and nieces satisfaction  Frann Rider, [facility]  [facility] [address] [place], [place] [address]  Phone [phone] Fax [phone] Note: This document was prepared with digital dictation and possible smart phrase technology. Any transcriptional errors that result from this process are unintentional.

## 2021-07-19 NOTE — Patient Instructions (Signed)
Continue Eliquis (apixaban) daily  and atorvastatin 20mg  daily  for secondary stroke prevention  Continue to follow up with PCP regarding cholesterol and blood pressure management  Maintain strict control of hypertension with blood pressure goal below 130/90 and cholesterol with LDL cholesterol (bad cholesterol) goal below 70 mg/dL.       Followup in the future with me in 1 year or call earlier if needed       Thank you for coming to see [place] at [facility]. I hope we have been able to provide you high quality care today.  You may receive a patient satisfaction survey over the next few weeks. We would appreciate your feedback and comments so that we may continue to improve ourselves and the health of our patients.

## 2021-07-27 ENCOUNTER — Telehealth: Payer: Self-pay | Admitting: Adult Health

## 2021-07-27 NOTE — Telephone Encounter (Signed)
Referral to home health sent to [facility].

## 2021-08-01 ENCOUNTER — Other Ambulatory Visit: Payer: Self-pay

## 2021-08-01 ENCOUNTER — Emergency Department (HOSPITAL_BASED_OUTPATIENT_CLINIC_OR_DEPARTMENT_OTHER)
Admission: EM | Admit: 2021-08-01 | Discharge: 2021-08-01 | Disposition: A | Discharge status: Home / Self Care | Payer: Medicare HMO | Attending: Emergency Medicine | Admitting: Emergency Medicine

## 2021-08-01 ENCOUNTER — Encounter (HOSPITAL_BASED_OUTPATIENT_CLINIC_OR_DEPARTMENT_OTHER): Payer: Self-pay | Admitting: *Deleted

## 2021-08-01 ENCOUNTER — Emergency Department (HOSPITAL_BASED_OUTPATIENT_CLINIC_OR_DEPARTMENT_OTHER): Payer: Medicare HMO

## 2021-08-01 ENCOUNTER — Telehealth: Payer: Self-pay | Admitting: Adult Health

## 2021-08-01 DIAGNOSIS — J3489 Other specified disorders of nose and nasal sinuses: Secondary | ICD-10-CM | POA: Diagnosis not present

## 2021-08-01 DIAGNOSIS — I4891 Unspecified atrial fibrillation: Secondary | ICD-10-CM | POA: Diagnosis not present

## 2021-08-01 DIAGNOSIS — Z853 Personal history of malignant neoplasm of breast: Secondary | ICD-10-CM | POA: Diagnosis not present

## 2021-08-01 DIAGNOSIS — Z20822 Contact with and (suspected) exposure to covid-19: Secondary | ICD-10-CM | POA: Insufficient documentation

## 2021-08-01 DIAGNOSIS — R059 Cough, unspecified: Secondary | ICD-10-CM | POA: Diagnosis present

## 2021-08-01 DIAGNOSIS — Z7901 Long term (current) use of anticoagulants: Secondary | ICD-10-CM | POA: Insufficient documentation

## 2021-08-01 DIAGNOSIS — J189 Pneumonia, unspecified organism: Secondary | ICD-10-CM | POA: Diagnosis not present

## 2021-08-01 DIAGNOSIS — Z79899 Other long term (current) drug therapy: Secondary | ICD-10-CM | POA: Diagnosis not present

## 2021-08-01 DIAGNOSIS — Z87891 Personal history of nicotine dependence: Secondary | ICD-10-CM | POA: Diagnosis not present

## 2021-08-01 DIAGNOSIS — I10 Essential (primary) hypertension: Secondary | ICD-10-CM | POA: Diagnosis not present

## 2021-08-01 LAB — RESP PANEL BY RT-PCR (FLU A&B, COVID) ARPGX2
Influenza A by PCR: NEGATIVE
Influenza B by PCR: NEGATIVE
SARS Coronavirus 2 by RT PCR: NEGATIVE

## 2021-08-01 MED ORDER — IPRATROPIUM-ALBUTEROL 0.5-2.5 (3) MG/3ML IN SOLN
3.0000 mL | Freq: Once | RESPIRATORY_TRACT | Status: AC
  Administered 2021-08-01: 3 mL via RESPIRATORY_TRACT
  Filled 2021-08-01: qty 3

## 2021-08-01 MED ORDER — LEVOFLOXACIN 750 MG PO TABS: 750.0000 mg | ORAL_TABLET | Freq: Every day | ORAL | 0 refills | Status: AC

## 2021-08-01 NOTE — ED Triage Notes (Signed)
Cough, sob, runny nose x 5 days.

## 2021-08-01 NOTE — Progress Notes (Signed)
RT ambulated with patient. Patient's O2 level did not drop below 93. Patient tolerated walk well. RN aware.

## 2021-08-01 NOTE — Discharge Instructions (Signed)
Your COVID and flu testing was negative today.  Your chest x-ray did show an area of possible atypical pneumonia.  Please take Levaquin for the next 5 days with food on your stomach.  Follow-up closely with your primary care doctor to make sure this is resolving.  Return if you have worsening symptoms.

## 2021-08-01 NOTE — Telephone Encounter (Signed)
Called Deidre Ala and advised he may continue with PT schedule he recommended. He verbalized understanding, appreciation.

## 2021-08-01 NOTE — Telephone Encounter (Signed)
Amber Barnett, PT @ [place]  Has called for 10 visits total 1 week 1, 2 week 2, 1 week 2 then once every other week 4

## 2021-08-01 NOTE — ED Provider Notes (Signed)
[place] EMERGENCY DEPARTMENT Provider Note   CSN: [id number] Arrival date & time: 08/01/21  1510     History Chief Complaint  Patient presents with   URI    Amber Barnett is a 87 y.o. female.  Amber Barnett is a 87 y.o. female with a history of hypertension, stroke, A. fib, DVT, who presents to the emergency department for evaluation of 5 days of cough, runny nose and shortness of breath.  Patient reports that symptoms initially started with cough congestion.  With this she felt a bit fatigued she has not had any known fevers.  She reports cough has become productive of mucus.  She reports that she has had some mild shortness of breath, has been using her inhaler as needed with improvement.  She denies any associated chest pain.  No abdominal pain, nausea, vomiting or diarrhea.  Denies any body aches or headaches.  Reports she has been using over-the-counter medications with some improvement.  No known sick contacts.  No other aggravating or alleviating factors.  The history is provided by the patient, medical records and a relative.      Past Medical History:  Diagnosis Date   Atrial fibrillation ([place])    DVT (deep venous thrombosis) (HCC)    Dysrhythmia    History of left breast cancer    Hypertension    Stroke [facility])     Patient Active Problem List   Diagnosis Date Noted   CVA (cerebral vascular accident) ([place]) 08/14/2020   GIB (gastrointestinal bleeding) 08/11/2020   Rectal bleeding 12/25/2019   Current use of long term anticoagulation 05/21/2017   Cerebral embolism with cerebral infarction ([place]) 05/18/2017   Essential hypertension 05/08/2017   Permanent atrial fibrillation ([place]) 05/08/2017    Past Surgical History:  Procedure Laterality Date   COLONOSCOPY WITH PROPOFOL N/A 12/29/2019   Procedure: COLONOSCOPY WITH PROPOFOL;  Surgeon: Arta Silence, MD;  Location: [facility];  Service: Endoscopy;  Laterality: N/A;   IR ANGIO  INTRA EXTRACRAN SEL COM CAROTID INNOMINATE UNI R MOD SED  05/18/2017   IR ANGIO VERTEBRAL SEL SUBCLAVIAN INNOMINATE UNI R MOD SED  05/18/2017   IR PERCUTANEOUS ART THROMBECTOMY/INFUSION INTRACRANIAL INC DIAG ANGIO  05/18/2017   IR RADIOLOGIST EVAL & MGMT  07/19/2017   RADIOLOGY WITH ANESTHESIA N/A 05/18/2017   Procedure: RADIOLOGY WITH ANESTHESIA;  Surgeon: Luanne Bras, MD;  Location: [facility];  Service: Radiology;  Laterality: N/A;     OB History   No obstetric history on file.     Family History  Problem Relation Age of Onset   Stroke Son     Social History   Tobacco Use   Smoking status: Former    Types: Cigarettes   Smokeless tobacco: Never  Vaping Use   Vaping Use: Never used  Substance Use Topics   Alcohol use: No   Drug use: Never    Home Medications Prior to Admission medications   Medication Sig Start Date End Date Taking? Authorizing Provider  levofloxacin (LEVAQUIN) 750 MG tablet Take 1 tablet (750 mg total) by mouth daily for 5 days. 08/01/21 08/06/21 Yes Jacqlyn Larsen, PA-C  albuterol (VENTOLIN HFA) 108 (90 Base) MCG/ACT inhaler Inhale 2 puffs into the lungs 4 (four) times daily as needed for wheezing or shortness of breath. 03/11/20   [provider]  apixaban (ELIQUIS) 2.5 MG TABS tablet Take by mouth 2 (two) times daily.    [provider]  atorvastatin (LIPITOR) 20 MG tablet Take  1 tablet (20 mg total) by mouth daily. 07/02/20 07/19/21  Garvin Fila, MD  Cholecalciferol (VITAMIN D3) 2000 units capsule Take 2,000 Units by mouth daily.    [provider]  diltiazem (TIAZAC) 120 MG 24 hr capsule Take 120 mg by mouth daily. 04/26/17   [provider]  diphenhydrAMINE (BENADRYL) 25 MG tablet Take 25 mg by mouth as needed for itching or allergies.    [provider]  famotidine (PEPCID) 20 MG tablet Take 20 mg by mouth daily. 11/27/19   [provider]  Fe Fum-FA-B Cmp-C-Zn-Mg-Mn-Cu (HEMATINIC PLUS VIT/MINERALS)  106-1 MG TABS Take 1 tablet by mouth 2 (two) times daily. 07/26/20   [provider]  furosemide (LASIX) 20 MG tablet Take 20 mg by mouth daily. MAY TAKE 2 TABLETS IF GAINS OVER 1.5 LB IN A DAY 07/01/20   [provider]  latanoprost (XALATAN) 0.005 % ophthalmic solution Place 1 drop into both eyes at bedtime. 10/16/16   [provider]  LORazepam (ATIVAN) 1 MG tablet Take 0.5 tablets (0.5 mg total) by mouth every 8 (eight) hours as needed for anxiety. 08/20/20   Domenic Polite, MD  MAGNESIUM-OXIDE 400 (241.3 Mg) MG tablet Take 800 mg by mouth daily.  12/20/19   [provider]  memantine (NAMENDA) 5 MG tablet Take 5 mg by mouth daily. 07/07/20   [provider]  pantoprazole (PROTONIX) 40 MG tablet Take 40 mg by mouth daily. 04/26/17   [provider]  potassium chloride (MICRO-K) 10 MEQ CR capsule Take 10 mEq by mouth 2 (two) times daily. 07/21/20   [provider]  SYMBICORT 80-4.5 MCG/ACT inhaler Inhale 2 puffs into the lungs 2 (two) times daily. 04/16/20   [provider]    Allergies    Fish-derived products, Lactuca virosa, Milk-related compounds, Other, and Amoxicillin  Review of Systems   Review of Systems  Constitutional:  Negative for chills and fever.  HENT:  Positive for congestion and rhinorrhea. Negative for sore throat.   Respiratory:  Positive for cough and shortness of breath.   Cardiovascular:  Negative for chest pain.  Gastrointestinal:  Negative for abdominal pain, diarrhea, nausea and vomiting.  Genitourinary:  Negative for dysuria.  Musculoskeletal:  Negative for myalgias.  Neurological:  Negative for syncope, light-headedness and headaches.  All other systems reviewed and are negative.  Physical Exam Updated Vital Signs BP (!) 158/80 (BP Location: Right Arm)   Pulse 78   Temp 98 F (36.7 C) (Oral)   Resp 20   Ht 5\' 6"  (1.676 m)   Wt 72.6 kg   SpO2 97%   BMI 25.83 kg/m   Physical  Exam Vitals and nursing note reviewed.  Constitutional:      General: She is not in acute distress.    Appearance: Normal appearance. She is well-developed. She is not ill-appearing or diaphoretic.  HENT:     Head: Normocephalic and atraumatic.     Nose: Rhinorrhea present.     Mouth/Throat:     Mouth: Mucous membranes are moist.     Pharynx: Oropharynx is clear. No posterior oropharyngeal erythema.  Eyes:     General:        Right eye: No discharge.        Left eye: No discharge.     Pupils: Pupils are equal, round, and reactive to light.  Cardiovascular:     Rate and Rhythm: Normal rate and regular rhythm.     Pulses: Normal pulses.  Heart sounds: Normal heart sounds.  Pulmonary:     Effort: Pulmonary effort is normal. No respiratory distress.     Breath sounds: Wheezing present. No rales.     Comments: Respirations equal and unlabored, patient satting well on room air, she has some decreased air movement bilaterally with some faint wheezing in the bases. Abdominal:     General: Bowel sounds are normal. There is no distension.     Palpations: Abdomen is soft. There is no mass.     Tenderness: There is no abdominal tenderness. There is no guarding.     Comments: Abdomen soft, nondistended, nontender to palpation in all quadrants without guarding or peritoneal signs  Musculoskeletal:        General: No deformity.     Cervical back: Neck supple.     Right lower leg: No edema.     Left lower leg: No edema.     Comments: No lower extremity edema noted.  Skin:    General: Skin is warm and dry.     Capillary Refill: Capillary refill takes less than 2 seconds.  Neurological:     Mental Status: She is alert and oriented to person, place, and time.     Coordination: Coordination normal.     Comments: Speech is clear, able to follow commands Moves extremities without ataxia, coordination intact  Psychiatric:        Mood and Affect: Mood normal.        Behavior: Behavior normal.     ED Results / Procedures / Treatments   Labs (all labs ordered are listed, but only abnormal results are displayed) Labs Reviewed  RESP PANEL BY RT-PCR (FLU A&B, COVID) [id number]    EKG None  Radiology DG Chest Port 1 View  Result Date: 08/01/2021 CLINICAL DATA:  Cough EXAM: PORTABLE CHEST 1 VIEW COMPARISON:  08/14/2020 FINDINGS: Cardiomegaly. Mild diffuse bilateral interstitial pulmonary opacity and probable small layering pleural effusions. The visualized skeletal structures are unremarkable. IMPRESSION: Cardiomegaly with mild diffuse bilateral interstitial pulmonary opacity and probable small layering pleural effusions, which may reflect edema or atypical/viral infection. No focal airspace opacity. Electronically Signed   By: Delanna Ahmadi M.D.   On: 08/01/2021 16:51    Procedures Procedures   Medications Ordered in ED Medications  ipratropium-albuterol (DUONEB) 0.5-2.5 (3) MG/3ML nebulizer solution 3 mL (3 mLs Nebulization Given 08/01/21 1642)    ED Course  I have reviewed the triage vital signs and the nursing notes.  Pertinent labs & imaging results that were available during my care of the patient were reviewed by me and considered in my medical decision making (see chart for details).    MDM Rules/Calculators/A&P                           87 year old female presents with 5 days of cough and congestion now with some worsening shortness of breath that has been responsive to her home albuterol inhaler.  On arrival patient is very well-appearing, mildly hypertensive but vitals otherwise normal.  Satting well on room air.  She has some decreased air movement with faint wheezing on lung exam.  She has not had fevers at home.  No associated chest pain and no GI symptoms.  Will get COVID and flu testing and chest x-ray.  Patient given breathing treatment here with improved air movement.  Patient has tested negative for COVID and flu.  I reviewed patient's chest x-ray, patient  has cardiomegaly which  is known, she has mild diffuse bilateral interstitial pulmonary opacities which could be edema versus atypical or viral infection.  Patient does not appear fluid overloaded on exam and I suspect this is more so related to infection given patient's cough and congestion.  Patient ambulated in the department and maintained normal O2 sats with no increased work of breathing. Will treat with respiratory fluoroquinolone as patient has an intolerance to amoxicillin.  Patient prescribed 5-day course of Levaquin, and instructed to follow-up closely with her primary care provider.  Strict return precautions provided.  Patient is well-appearing and discharged home in good condition.  Final Clinical Impression(s) / ED Diagnoses Final diagnoses:  Atypical pneumonia    Rx / DC Orders ED Discharge Orders          Ordered    levofloxacin (LEVAQUIN) 750 MG tablet  Daily        08/01/21 1828             Jacqlyn Larsen, PA-C 08/03/21 1438    Davonna Belling, MD 08/04/21 [phone]

## 2021-08-02 ENCOUNTER — Ambulatory Visit: Payer: Medicare HMO | Admitting: Neurology

## 2021-08-09 NOTE — Telephone Encounter (Signed)
I called Amber Barnett back and provided the verbal ok for the change in schedule.  He verbalized understanding and appreciation for the call.

## 2021-08-09 NOTE — Telephone Encounter (Signed)
Amber Barnett with [facility] called states pt has missed her visits for the past 2 weeks so she has not been seen since her initial visit. Amber Barnett is wanting to change the schedule to 1x 1 week, miss 2 weeks, 2x 2 weeks, 1x 4 weeks.

## 2021-09-15 NOTE — Telephone Encounter (Signed)
PT order received, signed and faxed back to [phone], confirmation received.

## 2021-09-20 NOTE — Telephone Encounter (Signed)
Per Janett Billow, she can probably see PCP sooner than here to do orders. she had general imbalance conerns

## 2021-09-20 NOTE — Telephone Encounter (Signed)
[facility] PT never actually started. If they are not wanting to approve, can place order for outpatient therapy.

## 2021-09-20 NOTE — Telephone Encounter (Signed)
Amber Barnett with [employer] called and left av oicemail on my phone asking for a peer to peer because additional home health services is being requested. If you would like to call to do a peer to peer with [facility] the phone number is [phone] option 1 and would need to be called by 3 pm today.

## 2021-09-20 NOTE — Telephone Encounter (Signed)
Contacted pt, LVM rq call back  

## 2021-09-20 NOTE — Telephone Encounter (Signed)
Pt niece returned call, spoke to her per DPR, informing her her insurance is not covering additional services to be done. Janett Billow recommends she can probably see PCP sooner than here to do orders or do outpatient. Niece states she just seen PCP Friday so she will give them a call and go from there. Advised to call the office back if she needs anything else, she was appreciative.

## 2021-09-26 ENCOUNTER — Ambulatory Visit: Payer: Medicare HMO | Admitting: Adult Health

## 2021-10-04 ENCOUNTER — Emergency Department (HOSPITAL_BASED_OUTPATIENT_CLINIC_OR_DEPARTMENT_OTHER)
Admission: EM | Admit: 2021-10-04 | Discharge: 2021-10-04 | Disposition: A | Discharge status: Home / Self Care | Payer: Medicare PPO | Attending: Emergency Medicine | Admitting: Emergency Medicine

## 2021-10-04 ENCOUNTER — Other Ambulatory Visit: Payer: Self-pay

## 2021-10-04 ENCOUNTER — Encounter (HOSPITAL_BASED_OUTPATIENT_CLINIC_OR_DEPARTMENT_OTHER): Payer: Self-pay

## 2021-10-04 ENCOUNTER — Emergency Department (HOSPITAL_BASED_OUTPATIENT_CLINIC_OR_DEPARTMENT_OTHER): Payer: Medicare PPO

## 2021-10-04 DIAGNOSIS — Z7901 Long term (current) use of anticoagulants: Secondary | ICD-10-CM | POA: Insufficient documentation

## 2021-10-04 DIAGNOSIS — J441 Chronic obstructive pulmonary disease with (acute) exacerbation: Secondary | ICD-10-CM | POA: Diagnosis not present

## 2021-10-04 DIAGNOSIS — R0602 Shortness of breath: Secondary | ICD-10-CM | POA: Diagnosis present

## 2021-10-04 LAB — COMPREHENSIVE METABOLIC PANEL
ALT: 19 U/L (ref 0–44)
AST: 23 U/L (ref 15–41)
Albumin: 4.1 g/dL (ref 3.5–5.0)
Alkaline Phosphatase: 91 U/L (ref 38–126)
Anion gap: 10 (ref 5–15)
BUN: 17 mg/dL (ref 8–23)
CO2: 23 mmol/L (ref 22–32)
Calcium: 9 mg/dL (ref 8.9–10.3)
Chloride: 103 mmol/L (ref 98–111)
Creatinine, Ser: 1.39 mg/dL — ABNORMAL HIGH (ref 0.44–1.00)
GFR, Estimated: 37 mL/min — ABNORMAL LOW (ref >60)
Glucose, Bld: 98 mg/dL (ref 70–99)
Potassium: 4 mmol/L (ref 3.5–5.1)
Sodium: 136 mmol/L (ref 135–145)
Total Bilirubin: 0.7 mg/dL (ref 0.3–1.2)
Total Protein: 8 g/dL (ref 6.5–8.1)

## 2021-10-04 LAB — CBC WITH DIFFERENTIAL/PLATELET
Abs Immature Granulocytes: 0.03 10*3/uL (ref 0.00–0.07)
Basophils Absolute: 0.1 10*3/uL (ref 0.0–0.1)
Basophils Relative: 1 %
Eosinophils Absolute: 0.2 10*3/uL (ref 0.0–0.5)
Eosinophils Relative: 3 %
HCT: 37.6 % (ref 36.0–46.0)
Hemoglobin: 12.1 g/dL (ref 12.0–15.0)
Immature Granulocytes: 0 %
Lymphocytes Relative: 25 %
Lymphs Abs: 1.9 10*3/uL (ref 0.7–4.0)
MCH: 26.3 pg (ref 26.0–34.0)
MCHC: 32.2 g/dL (ref 30.0–36.0)
MCV: 81.7 fL (ref 80.0–100.0)
Monocytes Absolute: 0.7 10*3/uL (ref 0.1–1.0)
Monocytes Relative: 10 %
Neutro Abs: 4.7 10*3/uL (ref 1.7–7.7)
Neutrophils Relative %: 61 %
Platelets: 189 10*3/uL (ref 150–400)
RBC: 4.6 MIL/uL (ref 3.87–5.11)
RDW: 17.9 % — ABNORMAL HIGH (ref 11.5–15.5)
WBC: 7.6 10*3/uL (ref 4.0–10.5)
nRBC: 0 % (ref 0.0–0.2)

## 2021-10-04 LAB — BRAIN NATRIURETIC PEPTIDE: B Natriuretic Peptide: 439.5 pg/mL — ABNORMAL HIGH (ref 0.0–100.0)

## 2021-10-04 LAB — TROPONIN I (HIGH SENSITIVITY): Troponin I (High Sensitivity): 9 ng/L (ref <18)

## 2021-10-04 MED ORDER — PREDNISONE 20 MG PO TABS: ORAL_TABLET | ORAL | 0 refills | Status: DC

## 2021-10-04 MED ORDER — PREDNISONE 50 MG PO TABS
60.0000 mg | ORAL_TABLET | Freq: Once | ORAL | Status: AC
  Administered 2021-10-04: 60 mg via ORAL
  Filled 2021-10-04: qty 1

## 2021-10-04 MED ORDER — FUROSEMIDE 10 MG/ML IJ SOLN
20.0000 mg | Freq: Once | INTRAMUSCULAR | Status: AC
  Administered 2021-10-04: 20 mg via INTRAVENOUS
  Filled 2021-10-04: qty 2

## 2021-10-04 MED ORDER — IPRATROPIUM-ALBUTEROL 0.5-2.5 (3) MG/3ML IN SOLN
3.0000 mL | Freq: Once | RESPIRATORY_TRACT | Status: AC
  Administered 2021-10-04: 3 mL via RESPIRATORY_TRACT
  Filled 2021-10-04: qty 3

## 2021-10-04 MED ORDER — FUROSEMIDE 20 MG PO TABS
40.0000 mg | ORAL_TABLET | Freq: Every day | ORAL | 0 refills | Status: DC
Start: 2021-10-04 — End: 2024-01-04

## 2021-10-04 NOTE — ED Provider Notes (Signed)
[place] EMERGENCY DEPARTMENT Provider Note   CSN: [id number] Arrival date & time: 10/04/21  1738     History  Chief Complaint  Patient presents with   Shortness of Breath    Amber Barnett is a 88 y.o. female.  88 yo F does not feel well.  Going on for a few days now.  No known sick contacts.  She feels like she is having trouble breathing.  Denies any chest pain or pressure denies abdominal pain denies urinary symptoms.  She was here in December but feels like she is recovered from that illness.   Shortness of Breath     Home Medications Prior to Admission medications   Medication Sig Start Date End Date Taking? Authorizing Provider  furosemide (LASIX) 20 MG tablet Take 2 tablets (40 mg total) by mouth daily for 4 days. 10/04/21 10/08/21 Yes Deno Etienne, DO  predniSONE (DELTASONE) 20 MG tablet 2 tabs po daily x 4 days 10/04/21  Yes Deno Etienne, DO  albuterol (VENTOLIN HFA) 108 (90 Base) MCG/ACT inhaler Inhale 2 puffs into the lungs 4 (four) times daily as needed for wheezing or shortness of breath. 03/11/20   [provider]  apixaban (ELIQUIS) 2.5 MG TABS tablet Take by mouth 2 (two) times daily.    [provider]  atorvastatin (LIPITOR) 20 MG tablet Take 1 tablet (20 mg total) by mouth daily. 07/02/20 07/19/21  Garvin Fila, MD  Cholecalciferol (VITAMIN D3) 2000 units capsule Take 2,000 Units by mouth daily.    [provider]  diltiazem (TIAZAC) 120 MG 24 hr capsule Take 120 mg by mouth daily. 04/26/17   [provider]  diphenhydrAMINE (BENADRYL) 25 MG tablet Take 25 mg by mouth as needed for itching or allergies.    [provider]  famotidine (PEPCID) 20 MG tablet Take 20 mg by mouth daily. 11/27/19   [provider]  Fe Fum-FA-B Cmp-C-Zn-Mg-Mn-Cu (HEMATINIC PLUS VIT/MINERALS) 106-1 MG TABS Take 1 tablet by mouth 2 (two) times daily. 07/26/20   [provider]  furosemide (LASIX) 20 MG tablet  Take 20 mg by mouth daily. MAY TAKE 2 TABLETS IF GAINS OVER 1.5 LB IN A DAY 07/01/20   [provider]  latanoprost (XALATAN) 0.005 % ophthalmic solution Place 1 drop into both eyes at bedtime. 10/16/16   [provider]  LORazepam (ATIVAN) 1 MG tablet Take 0.5 tablets (0.5 mg total) by mouth every 8 (eight) hours as needed for anxiety. 08/20/20   Domenic Polite, MD  MAGNESIUM-OXIDE 400 (241.3 Mg) MG tablet Take 800 mg by mouth daily.  12/20/19   [provider]  memantine (NAMENDA) 5 MG tablet Take 5 mg by mouth daily. 07/07/20   [provider]  pantoprazole (PROTONIX) 40 MG tablet Take 40 mg by mouth daily. 04/26/17   [provider]  potassium chloride (MICRO-K) 10 MEQ CR capsule Take 10 mEq by mouth 2 (two) times daily. 07/21/20   [provider]  SYMBICORT 80-4.5 MCG/ACT inhaler Inhale 2 puffs into the lungs 2 (two) times daily. 04/16/20   [provider]      Allergies    Fish-derived products, Lactuca virosa, Milk-related compounds, Other, and Amoxicillin    Review of Systems   Review of Systems  Respiratory:  Positive for shortness of breath.    Physical Exam Updated Vital Signs BP (!) 158/94 (BP Location: Right Arm)    Pulse 100    Temp 98.3 F (36.8 C) (Oral)  Resp 18    Ht 5\' 5"  (1.651 m)    Wt 74.4 kg    SpO2 97%    BMI 27.29 kg/m  Physical Exam Vitals and nursing note reviewed.  Constitutional:      General: She is not in acute distress.    Appearance: She is well-developed. She is not diaphoretic.  HENT:     Head: Normocephalic and atraumatic.  Eyes:     Pupils: Pupils are equal, round, and reactive to light.  Cardiovascular:     Rate and Rhythm: Normal rate and regular rhythm.     Heart sounds: No murmur heard.   No friction rub. No gallop.  Pulmonary:     Effort: Pulmonary effort is normal.     Breath sounds: Wheezing present. No rales.     Comments: Prolonged expiratory effort with wheezes. Abdominal:      General: There is no distension.     Palpations: Abdomen is soft.     Tenderness: There is no abdominal tenderness.  Musculoskeletal:        General: No tenderness.     Cervical back: Normal range of motion and neck supple.  Skin:    General: Skin is warm and dry.  Neurological:     Mental Status: She is alert and oriented to person, place, and time.  Psychiatric:        Behavior: Behavior normal.    ED Results / Procedures / Treatments   Labs (all labs ordered are listed, but only abnormal results are displayed) Labs Reviewed  CBC WITH DIFFERENTIAL/PLATELET - Abnormal; Notable for the following components:      Result Value   RDW 17.9 (*)    All other components within normal limits  COMPREHENSIVE METABOLIC PANEL - Abnormal; Notable for the following components:   Creatinine, Ser 1.39 (*)    GFR, Estimated 37 (*)    All other components within normal limits  BRAIN NATRIURETIC PEPTIDE - Abnormal; Notable for the following components:   B Natriuretic Peptide 439.5 (*)    All other components within normal limits  TROPONIN I (HIGH SENSITIVITY)    EKG EKG Interpretation  Date/Time:  Tuesday October 04 2021 17:50:33 EST Ventricular Rate:  108 PR Interval:    QRS Duration: 94 QT Interval:  350 QTC Calculation: 431 R Axis:   -85 Text Interpretation: Atrial fibrillation Ventricular premature complex Left anterior fascicular block Anteroseptal infarct, age indeterminate No significant change since last tracing Confirmed by Deno Etienne [phone]) on 10/04/2021 6:38:09 PM  Radiology DG Chest Port 1 View  Result Date: 10/04/2021 CLINICAL DATA:  Shortness of breath cough EXAM: PORTABLE CHEST 1 VIEW COMPARISON:  08/01/2021 FINDINGS: Cardiomegaly with probable trace pleural effusions. Probable mild interstitial edema. No consolidation or pneumothorax. IMPRESSION: Cardiomegaly with trace pleural effusions and possible mild interstitial edema Electronically Signed   By: Donavan Foil  M.D.   On: 10/04/2021 18:22    Procedures Procedures    Medications Ordered in ED Medications  furosemide (LASIX) injection 20 mg (has no administration in time range)  ipratropium-albuterol (DUONEB) 0.5-2.5 (3) MG/3ML nebulizer solution 3 mL (3 mLs Nebulization Given 10/04/21 1816)  predniSONE (DELTASONE) tablet 60 mg (60 mg Oral Given 10/04/21 1803)    ED Course/ Medical Decision Making/ A&P                           Medical Decision Making Amount and/or Complexity of Data Reviewed Labs: ordered. Radiology: ordered.  Risk Prescription drug management.   Patient is a 88 y.o. female with a cc of shortness of breath.  She also does not feel well.  She has trouble describing what is going on.  States its been going on for a few days now.  On my exam she has diffuse wheezes.  Will give DuoNeb and steroids and reassess.  Blood work chest x-ray.  She tells me she has no history of COPD but does state that she uses inhalers at home I reviewed the patients chart and she does have a history of pulmonary hypertension.  Sees cardiology through the [facility] and their note they do document that she has a history of COPD and sleep apnea as well as had a history of a DVT in the past is why she is on Eliquis.  I independently interpreted the patients labs and imaging chest x-ray with mild edema compared to prior.  No anemia no significant electrolyte abnormality.  Troponin negative.  BNP elevated.  Patient feeling much better after a DuoNeb and steroids.  Will discharge home.  Dose of Lasix here and have her double her Lasix for couple days.  Cardiology and PCP follow-up.  6:45 PM:  I have discussed the diagnosis/risks/treatment options with the patient and family.  Evaluation and diagnostic testing in the emergency department does not suggest an emergent condition requiring admission or immediate intervention beyond what has been performed at this time.  They will follow up with  PCP, cards.  We also discussed returning to the ED immediately if new or worsening sx occur. We discussed the sx which are most concerning (e.g., sudden worsening pain, fever, inability to tolerate by mouth) that necessitate immediate return. Medications administered to the patient during their visit and any new prescriptions provided to the patient are listed below.  Medications given during this visit Medications  furosemide (LASIX) injection 20 mg (has no administration in time range)  ipratropium-albuterol (DUONEB) 0.5-2.5 (3) MG/3ML nebulizer solution 3 mL (3 mLs Nebulization Given 10/04/21 1816)  predniSONE (DELTASONE) tablet 60 mg (60 mg Oral Given 10/04/21 1803)     The patient appears reasonably screen and/or stabilized for discharge and I doubt any other medical condition or other [facility] requiring further screening, evaluation, or treatment in the ED at this time prior to discharge.          Final Clinical Impression(s) / ED Diagnoses Final diagnoses:  COPD exacerbation ([place])    Rx / DC Orders ED Discharge Orders          Ordered    predniSONE (DELTASONE) 20 MG tablet        10/04/21 1842    furosemide (LASIX) 20 MG tablet  Daily        10/04/21 1842              Deno Etienne, DO 10/04/21 1845

## 2021-10-04 NOTE — ED Triage Notes (Signed)
Pt states she has been coughing for the past few days. States she gets really sob when she begins to cough. Cough is productive.

## 2021-10-04 NOTE — ED Notes (Signed)
Pt ambulated to restroom, successfully voided

## 2021-10-04 NOTE — Discharge Instructions (Signed)
I think your primary issue is that your lungs are having worsening with your COPD.  Please use your rescue inhaler every 4 hours while awake for the next couple days.  I prescribed you steroids that can also help you with this.  What we also Saw in your work appears that you might have a little bit too much fluid on you.  I am giving you a dose of medicine through the IV that should help jumpstart you urinating a bit more.  Please double your Lasix for a few days and call your cardiologist.

## 2022-07-04 ENCOUNTER — Emergency Department (HOSPITAL_BASED_OUTPATIENT_CLINIC_OR_DEPARTMENT_OTHER): Payer: Medicare (Managed Care)

## 2022-07-04 ENCOUNTER — Encounter (HOSPITAL_BASED_OUTPATIENT_CLINIC_OR_DEPARTMENT_OTHER): Payer: Self-pay | Admitting: Emergency Medicine

## 2022-07-04 ENCOUNTER — Emergency Department (HOSPITAL_BASED_OUTPATIENT_CLINIC_OR_DEPARTMENT_OTHER)
Admission: EM | Admit: 2022-07-04 | Discharge: 2022-07-04 | Disposition: A | Discharge status: Home / Self Care | Payer: Medicare (Managed Care) | Attending: Emergency Medicine | Admitting: Emergency Medicine

## 2022-07-04 DIAGNOSIS — Z7901 Long term (current) use of anticoagulants: Secondary | ICD-10-CM | POA: Diagnosis not present

## 2022-07-04 DIAGNOSIS — Z87891 Personal history of nicotine dependence: Secondary | ICD-10-CM | POA: Insufficient documentation

## 2022-07-04 DIAGNOSIS — M1711 Unilateral primary osteoarthritis, right knee: Secondary | ICD-10-CM | POA: Insufficient documentation

## 2022-07-04 DIAGNOSIS — I10 Essential (primary) hypertension: Secondary | ICD-10-CM | POA: Insufficient documentation

## 2022-07-04 DIAGNOSIS — Z79899 Other long term (current) drug therapy: Secondary | ICD-10-CM | POA: Insufficient documentation

## 2022-07-04 DIAGNOSIS — M79604 Pain in right leg: Secondary | ICD-10-CM | POA: Insufficient documentation

## 2022-07-04 DIAGNOSIS — Z853 Personal history of malignant neoplasm of breast: Secondary | ICD-10-CM | POA: Insufficient documentation

## 2022-07-04 MED ORDER — HYDROCODONE-ACETAMINOPHEN 5-325 MG PO TABS
1.0000 | ORAL_TABLET | Freq: Once | ORAL | Status: AC
  Administered 2022-07-04: 1 via ORAL
  Filled 2022-07-04: qty 1

## 2022-07-04 MED ORDER — HYDROCODONE-ACETAMINOPHEN 5-325 MG PO TABS: 1.0000 | ORAL_TABLET | Freq: Four times a day (QID) | ORAL | 0 refills | Status: DC | PRN

## 2022-07-04 MED ORDER — ONDANSETRON 4 MG PO TBDP
4.0000 mg | ORAL_TABLET | Freq: Once | ORAL | Status: AC
  Administered 2022-07-04: 4 mg via ORAL
  Filled 2022-07-04: qty 1

## 2022-07-04 NOTE — ED Provider Notes (Addendum)
[place] EMERGENCY DEPARTMENT Provider Note   CSN: [id number] Arrival date & time: 07/04/22  1343     History  Chief Complaint  Patient presents with   Leg Pain    Amber Barnett is a 88 y.o. female.  Patient with acute pain to her right upper leg medial aspect and somewhat around the medial side of the knee this morning.  Painful to bear weight making it difficult for her to bear weight.  Patient denies any fall or injury.  States there is some swelling there.  Patient is swelling to both legs.  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  Patient has surgical scars on both legs kind of the proximal medial aspect of the leg she is not sure what that was for.  Denies any shortness of breath or chest pain.  Past medical history significant for hypertension history of breast cancer atrial fibrillation deep vein thrombosis prior stroke and dysrhythmia.  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  Patient is a former smoker.  Do not see anything listed to explain the scars on the leg.  Patient denies any shortness of breath any cough any chest pain any abdominal pain.       Home Medications Prior to Admission medications   Medication Sig Start Date End Date Taking? Authorizing Provider  albuterol (VENTOLIN HFA) 108 (90 Base) MCG/ACT inhaler Inhale 2 puffs into the lungs 4 (four) times daily as needed for wheezing or shortness of breath. 03/11/20   [provider]  apixaban (ELIQUIS) 2.5 MG TABS tablet Take by mouth 2 (two) times daily.    [provider]  atorvastatin (LIPITOR) 20 MG tablet Take 1 tablet (20 mg total) by mouth daily. 07/02/20 07/19/21  Garvin Fila, MD  Cholecalciferol (VITAMIN D3) 2000 units capsule Take 2,000 Units by mouth daily.    [provider]  diltiazem (TIAZAC) 120 MG 24 hr capsule Take 120  mg by mouth daily. 04/26/17   [provider]  diphenhydrAMINE (BENADRYL) 25 MG tablet Take 25 mg by mouth as needed for itching or allergies.    [provider]  famotidine (PEPCID) 20 MG tablet Take 20 mg by mouth daily. 11/27/19   [provider]  Fe Fum-FA-B Cmp-C-Zn-Mg-Mn-Cu (HEMATINIC PLUS VIT/MINERALS) 106-1 MG TABS Take 1 tablet by mouth 2 (two) times daily. 07/26/20   [provider]  furosemide (LASIX) 20 MG tablet Take 20 mg by mouth daily. MAY TAKE 2 TABLETS IF GAINS OVER 1.5 LB IN A DAY 07/01/20   [provider]  furosemide (LASIX) 20 MG tablet Take 2 tablets (40 mg total) by mouth daily for 4 days. 10/04/21 10/08/21  Deno Etienne, DO  latanoprost (XALATAN) 0.005 % ophthalmic solution Place 1 drop into both eyes at bedtime. 10/16/16   [provider]  LORazepam (ATIVAN) 1 MG tablet Take 0.5 tablets (0.5 mg total) by mouth every 8 (eight) hours as needed for anxiety. 08/20/20   Domenic Polite, MD  MAGNESIUM-OXIDE 400 (241.3 Mg) MG tablet Take 800 mg by mouth daily.  12/20/19   [provider]  memantine (NAMENDA) 5 MG tablet Take 5 mg by mouth daily. 07/07/20   [provider]  pantoprazole (PROTONIX) 40 MG tablet Take 40 mg by mouth daily. 04/26/17   [provider]  potassium chloride (MICRO-K) 10 MEQ CR capsule Take 10 mEq by mouth 2 (two)  times daily. 07/21/20   [provider]  predniSONE (DELTASONE) 20 MG tablet 2 tabs po daily x 4 days 10/04/21   Deno Etienne, DO  SYMBICORT 80-4.5 MCG/ACT inhaler Inhale 2 puffs into the lungs 2 (two) times daily. 04/16/20   [provider]      Allergies    Fish-derived products, Milk-related compounds, Other, Wild lettuce extract (lactuca virosa), and Amoxicillin    Review of Systems   Review of Systems  Constitutional:  Negative for chills and fever.  HENT:  Negative for rhinorrhea and sore throat.   Eyes:  Negative for visual disturbance.  Respiratory:   Negative for cough and shortness of breath.   Cardiovascular:  Positive for leg swelling. Negative for chest pain.  Gastrointestinal:  Negative for abdominal pain, diarrhea, nausea and vomiting.  Genitourinary:  Negative for dysuria.  Musculoskeletal:  Positive for joint swelling. Negative for back pain and neck pain.  Skin:  Negative for rash.  Neurological:  Negative for dizziness, light-headedness and headaches.  Hematological:  Does not bruise/bleed easily.  Psychiatric/Behavioral:  Negative for confusion.     Physical Exam Updated Vital Signs BP 138/74 (BP Location: Left Arm)   Pulse 84   Temp 98.2 F (36.8 C)   Resp 18   Ht 1.651 m ('5\' 5"'$ )   Wt 72.6 kg   SpO2 100%   BMI 26.63 kg/m  Physical Exam Vitals and nursing note reviewed.  Constitutional:      General: She is not in acute distress.    Appearance: Normal appearance. She is well-developed.  HENT:     Head: Normocephalic and atraumatic.  Eyes:     Extraocular Movements: Extraocular movements intact.     Conjunctiva/sclera: Conjunctivae normal.     Pupils: Pupils are equal, round, and reactive to light.  Cardiovascular:     Rate and Rhythm: Normal rate and regular rhythm.     Heart sounds: No murmur heard. Pulmonary:     Effort: Pulmonary effort is normal. No respiratory distress.     Breath sounds: Normal breath sounds.  Abdominal:     Palpations: Abdomen is soft.     Tenderness: There is no abdominal tenderness.  Musculoskeletal:        General: Tenderness present. No swelling.     Cervical back: Normal range of motion and neck supple.     Right lower leg: Edema present.     Comments: Patient with some swelling to the right knee.  Some swelling to the medial aspect of the right leg.  Some swelling to ankle area a little bit increased compared to the left leg.  Good cap refill to the foot foot is warm.  No obvious bony tenderness or joint line tenderness.  No evidence of effusion.  Patient with about a 7 cm  eschar to the proximal medial aspect of the right leg and the left leg that is both well-healed.  Skin:    General: Skin is warm and dry.     Capillary Refill: Capillary refill takes less than 2 seconds.  Neurological:     General: No focal deficit present.     Mental Status: She is alert and oriented to person, place, and time.  Psychiatric:        Mood and Affect: Mood normal.     ED Results / Procedures / Treatments   Labs (all labs ordered are listed, but only abnormal results are displayed) Labs Reviewed - No data to display  EKG None  Radiology DG Knee Complete 4 Views Right  Result Date: 07/04/2022 CLINICAL DATA:  Right lower leg pain and swelling since this morning EXAM: RIGHT KNEE - COMPLETE 4+ VIEW COMPARISON:  None Available. FINDINGS: Frontal, bilateral oblique, lateral views of the right knee are obtained. No acute fracture, subluxation, or dislocation. There is mild 3 compartmental osteoarthritis greatest in the lateral compartment. No joint effusion. Mild diffuse subcutaneous edema. Incidental note is made of prior infarct distal right femoral medullary cavity. IMPRESSION: 1. No acute fracture. 2. 3 compartmental osteoarthritis greatest laterally. 3. Diffuse subcutaneous edema. Electronically Signed   By: Randa Ngo M.D.   On: 07/04/2022 18:43   US Venous Img Lower Unilateral Right  Result Date: 07/04/2022 CLINICAL DATA:  Right leg pain.  Prior DVT. EXAM: RIGHT LOWER EXTREMITY VENOUS DOPPLER ULTRASOUND TECHNIQUE: Gray-scale sonography with compression, as well as color and duplex ultrasound, were performed to evaluate the deep venous system(s) from the level of the common femoral vein through the popliteal and proximal calf veins. COMPARISON:  None Available. FINDINGS: VENOUS Normal compressibility of the common femoral, superficial femoral, and popliteal veins, as well as the visualized calf veins. Visualized portions of profunda femoral vein and great saphenous vein  unremarkable. No filling defects to suggest DVT on grayscale or color Doppler imaging. Doppler waveforms show normal direction of venous flow, normal respiratory plasticity and response to augmentation. Limited views of the contralateral common femoral vein are unremarkable. OTHER None. Limitations: none IMPRESSION: No right lower extremity DVT is demonstrated. Electronically Signed   By: Van Clines M.D.   On: 07/04/2022 18:09    Procedures Procedures    Medications Ordered in ED Medications  HYDROcodone-acetaminophen (NORCO/VICODIN) 5-325 MG per tablet 1 tablet (1 tablet Oral Given 07/04/22 1727)  ondansetron (ZOFRAN-ODT) disintegrating tablet 4 mg (4 mg Oral Given 07/04/22 1727)    ED Course/ Medical Decision Making/ A&P                           Medical Decision Making Amount and/or Complexity of Data Reviewed Radiology: ordered.  Risk Prescription drug management.  She will begin on the ultrasound if negative for DVT or superficial vein abnormality we will go ahead and get x-rays of the knee we will give patient some pain medication and some antinausea medicine just in case the pain medicine would make her nausea.  Ultrasound negative for any superficial or deep vein thrombosis.  X-ray of the knee shows 3 compartmental osteoarthritis greatest laterally and diffuse subcutaneous edema of the hip.  I think this is where the pain is coming from.  Will treat symptomatically with pain medication and have her follow-up with sports medicine or orthopedics.  Final Clinical Impression(s) / ED Diagnoses Final diagnoses:  Right leg pain  Osteoarthritis of right knee, unspecified osteoarthritis type    Rx / DC Orders ED Discharge Orders     None         Fredia Sorrow, MD 07/04/22 1726    Fredia Sorrow, MD 07/04/22 1902

## 2022-07-04 NOTE — ED Triage Notes (Signed)
Right lower leg pain and swelling since this morning. Denies injury.

## 2022-07-04 NOTE — Discharge Instructions (Addendum)
Wear the knee immobilizer for comfort.  Can take it off to bathe.  Make an appointment follow-up with either sports medicine or orthopedics both provided.  Continue all your current medicine.  Take the hydrocodone as needed for pain relief.  Work-up shows no evidence of blood clot.  But shows a lot of osteoarthritis in that right knee.  This is most likely the cause of the swelling and the pain.

## 2022-07-19 ENCOUNTER — Ambulatory Visit: Payer: Medicare HMO | Admitting: Adult Health

## 2022-09-21 NOTE — Progress Notes (Signed)
[facility] [address] [place]. [place] [address] [phone]       OFFICE FOLLOW-UP NOTE  [name] Date of Birth:  10-31-1933 Medical Record Number:  [id number]    Primary neurologist: Dr. Leonie Man Reason for visit: Stroke follow-up  Chief complaint: Stroke follow-up   HPI:  07/23/2017 Dr. Leonie Man; Amber Barnett is a pleasant 89 year old African-American lady seen today for the first office follow-up visit following hospital admission for stroke in September 2018. She is accompanied by her daughter. History is obtained from them and review of electronic medical records. I have personally reviewed imaging films.Amber Barnett presented with left sized gaze deviation, right hemiparesis, and aphasia witness by family. The patient admitted to not being compliant with taking the second dose of eliquis every day and consistently. On presentation head CT indicated distal M1 occlusive thrombus. CTA was performed and subsequent successful revascularization by Dr. Estanislado Pandy. Subsequent MRI head showed punctate infarct without significant residual stenosis. She had no significant resultant deficits and was discharged to home with her niece for supervision and outpatient PT recommended. On review of risk factors she was noted to report missing doses of Eliquis intermittently, including right before this admission. She remained in Afib throughout her evaluation. Eliquis was discontinued and Xarelto prescribed at discharge for hopefully easier treatment adherence. Patient was counseled to be compliant with Xarelto and to take it with the proper meal consistently at the same time every day. She voiced understanding. She states she has done very well since discharge. She has no residual neurological deficits. She is tolerating Xarelto well and consistently takes it with lunch and a proper meal. She did have minor nasal bleeding a few weeks ago following a cold but that  seemed to have stopped. Her blood pressure is well controlled and today it is 127/73. She does not have a any specific neurological complaints today. She's had no recurrent stroke or TIA symptoms. She is completely independent of all active disease of daily living.  Update 01/21/2018 Dr. Leonie Man: She returns for f/u after last visit 6 months ago. She is accompanied by her daughter. She continues to do well without any recurrent stroke or TIA symptoms. She did have some mild rectal bleeding on xarelto 20 mg daily which has stopped after her primary MD decreased the dose to 15 mg daily which she appears to be tolerating well. Her BP is well controled and is 136/79 today.  She denies any recurrent stroke or TIA symptoms.  In fact she has no new complaints today..  She continues to have swelling in her leg she saw she saw her cardiologist recently who increased the dose of amlodipine as she was found to have pulmonary hypertension.Marland Kitchen Update 06/10/2019 Dr. Leonie Man: She returns for follow-up after last visit more than a year ago.  She is accompanied by her niece.  Patient continues to do well she has had no recurrent stroke or TIA symptoms.  She remains on Xarelto which is tolerating well without bleeding or bruising.  Her blood pressure is usually well controlled today it is borderline at 148/66 in office.  She states that in the last couple of months she is noted some intermittent swallowing difficulties.  She often coughs out some phlegm.  She feels sometimes she is choking on her saliva.  She denies any pain.  Her appetite is good.  She denies weight loss.  She has not discussed this complaints with the primary care physician but plans to do so soon.  She is planning on having dental extraction and has questions about holding Xarelto in the stroke risk in the periprocedure well.  She has no other new neurological complaints. 06/09/2020 Dr. Leonie Man: She returns for follow-up after last visit a year ago.  She is accompanied  by her daughter today.  She states she is done well from the stroke standpoint without recurrent stroke or TIA symptoms.  She had a GI bleeding in May 2021 and had to stop the Xarelto unfortunately she developed bilateral popliteal artery embolism requiring emergent bilateral popliteal embolectomies and surgery.  She is done well since then.  She is back on Xarelto and tolerating it well without bruising or bleeding.  She has recently moved into independent living.  She also had pleural effusion and is being followed at [facility] for pulmonary hypertension.  She has no new neurological complaints today.    Update 12/22/2020 Dr. Leonie Man: She returns for follow-up after recent hospitalization in December 2021 for another stroke.  She on presented 08/14/2020  with sudden onset of slurred speech and facial droop initially noted yesterday evening.  Patient was speaking to a friend noticed it but did not seek medical help.  This morning when the daughter talked to the patient she had obvious slurred speech and facial droop.  She denies any extremity weakness numbness gait or balance problems.  Patient was recently admitted on 08/11/2020 with dark-colored stools which were felt to be diverticular bleed.  Xarelto was held and she was discharged yesterday with plans to resume Xarelto today which is on heparin so far.  She has undergone CT scan of the head noncontrast study in the ER which I personally reviewed showed low-density in the right insula consistent with an acute infarct she also underwent CT angiogram of brain and neck which shows Right M2 branch occlusion/pre occlusion with faint reconstitution and then subsequent reocclusion at the M3 level,correlating with the level of infarct. Regional M3/4 branch is appear asymmetrically irregular on MIPS.  IV tPA was not considered given recent lower GI bleeding.  Mechanical thrombectomy was also not considered as NIH stroke scale was only for an the M2 occlusion was felt to  be too distal for thrombectomy.  LDL cholesterol 64 mg percent.  Hemoglobin A1c was 5.3.  2D echo showed normal ejection fraction of 60 to 65%.  Left atrial size is moderately dilated.  Carotid ultrasound on 08/30/2020 showed no evidence of significant extracranial carotid stenosis.  Patient states she has done well since discharge.  She was anticoagulation with was held initially for a week or so and then subsequently she was restarted on Eliquis 2.5 twice daily which she seems to be tolerating well without any further bleeding and stable hematocrit.  She is currently living in assisted living.  Her speech seems to have improved and occasionally she has some slurred speech.  Her balance is yet off and she has been using a cane.  She uses walker for long distances.  She is unsteady but she has had no falls or injuries.  Blood pressures well controlled and today it is 124/76.  She recently saw Dr. 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   Update 07/19/2021 JM: Returns for 9-monthstroke follow-up accompanied by her niece   Stable from stroke standpoint -denies new stroke/TIA symptoms.   Reports residual continued imbalance - [facility] but feels  like her balance could further improve. She is currently using a cane but does feel more stable with use of RW -denies any recent falls. Denies any residual speech language difficulties Continues to reside at [facility] in [place], [place]  Compliant on Eliquis 2.5 mg twice daily -denies side effects and no additional bleeding Compliant on atorvastatin -denies side effects Blood pressure today 138/68 - monitors at home which has been stable  Routinely follows with PCP every 2 months - recent labs 11/7 which was good per niece (unable to view via epic) Routinely followed by cardiology and [facility] thru [place]  No further concerns at this time   Update 09/25/2022 JM: Patient returns for stroke follow-up after prior visit over 1 year ago accompanied by her niece.  Overall stable without any new stroke/TIA symptoms.  Residual imbalance, stable since prior visit.  Use of cane for ambulation, denies any recent falls. Does c/o short term memory loss which has been present since her stroke, reports good days and bad days. Was started on Namenda by PCP back in the fall, reports memory has been generally stable since her stroke in 2021 but was hospitalized back in November for pneumonia and December for UTI and gastritis, has noticed some worsening since hospitalizations but overall stable over the past month.  Does do cross word puzzles, bingo and dominos routinely at home. Lives alone, does do ADL's independently, niece assists with IADLs.  No known family history of dementia.  Remains on Eliquis and atorvastatin Blood pressure 135/83 She continues to routinely be followed by PCP (has f/u visit 2/11), cardiology and vascular surgery  No further concerns at this time    ROS:   14 system review of systems is positive for those listed in HPI and all other systems negative   PMH:  Past Medical History:  Diagnosis Date   Atrial fibrillation ([place])    DVT (deep venous thrombosis) ([place])    Dysrhythmia    History of left breast cancer    Hypertension    Stroke [facility])     Social History:  Social History   Socioeconomic History   Marital status: Widowed    Spouse name: Not on file   Number of children: Not on file   Years of education: Not on file   Highest education level: Not on file  Occupational History   Occupation: Retired  Tobacco Use   Smoking status: Former    Types: Cigarettes   Smokeless tobacco: Never  Scientific laboratory technician Use: Never used  Substance and Sexual Activity   Alcohol use: No   Drug use: Never   Sexual activity: Not on file  Other Topics Concern   Not on file   Social History Narrative   Lives alone Independent Living, [facility] in [place] no caffeine   Social Determinants of Health   Financial Resource Strain: Not on file  Food Insecurity: Not on file  Transportation Needs: Not on file  Physical Activity: Not on file  Stress: Not on file  Social Connections: Not on file  Intimate Partner Violence: Not on file    Medications:   Current Outpatient Medications on File Prior to Visit  Medication Sig Dispense Refill   albuterol (VENTOLIN HFA) 108 (90 Base) MCG/ACT inhaler Inhale 2 puffs into the lungs 4 (four) times daily as needed for wheezing or shortness of breath.     apixaban (ELIQUIS) 2.5 MG  TABS tablet Take by mouth 2 (two) times daily.     Cholecalciferol (VITAMIN D3) 2000 units capsule Take 2,000 Units by mouth daily.     diltiazem (TIAZAC) 120 MG 24 hr capsule Take 120 mg by mouth daily.  6   diphenhydrAMINE (BENADRYL) 25 MG tablet Take 25 mg by mouth as needed for itching or allergies.     Fe Fum-FA-B Cmp-C-Zn-Mg-Mn-Cu (HEMATINIC PLUS VIT/MINERALS) 106-1 MG TABS Take 1 tablet by mouth 2 (two) times daily.     furosemide (LASIX) 20 MG tablet Take 20 mg by mouth daily. MAY TAKE 2 TABLETS IF GAINS OVER 1.5 LB IN A DAY     latanoprost (XALATAN) 0.005 % ophthalmic solution Place 1 drop into both eyes at bedtime.     LORazepam (ATIVAN) 1 MG tablet Take 0.5 tablets (0.5 mg total) by mouth every 8 (eight) hours as needed for anxiety.  0   MAGNESIUM-OXIDE 400 (241.3 Mg) MG tablet Take 800 mg by mouth daily.      memantine (NAMENDA) 5 MG tablet Take 5 mg by mouth daily.     pantoprazole (PROTONIX) 40 MG tablet Take 40 mg by mouth daily.  2   potassium chloride (MICRO-K) 10 MEQ CR capsule Take 10 mEq by mouth 2 (two) times daily.     predniSONE (DELTASONE) 20 MG tablet 2 tabs po daily x 4 days 8 tablet 0   SYMBICORT 80-4.5 MCG/ACT inhaler Inhale 2 puffs into the lungs 2 (two) times daily.     atorvastatin  (LIPITOR) 20 MG tablet Take 1 tablet (20 mg total) by mouth daily. 30 tablet 11   famotidine (PEPCID) 20 MG tablet Take 20 mg by mouth daily. (Patient not taking: Reported on 09/25/2022)     furosemide (LASIX) 20 MG tablet Take 2 tablets (40 mg total) by mouth daily for 4 days. 8 tablet 0   HYDROcodone-acetaminophen (NORCO/VICODIN) 5-325 MG tablet Take 1 tablet by mouth every 6 (six) hours as needed for moderate pain. (Patient not taking: Reported on 09/25/2022) 14 tablet 0   No current facility-administered medications on file prior to visit.    Allergies:   Allergies  Allergen Reactions   Fish-Derived Products Swelling    Facial swelling then tongue swelling   Milk-Related Compounds Diarrhea    WHOLE MILK   Other Swelling and Diarrhea    Allergy to all nuts - facial swelling, then tongue swelling  WHOLE MILK   Wild Lettuce Extract (Lactuca Virosa) Diarrhea   Amoxicillin Nausea And Vomiting    Physical Exam Today's Vitals   09/25/22 1527  BP: 135/83  Pulse: 82  Weight: 145 lb 3.2 oz (65.9 kg)  Height: '5\' 6"'$  (1.676 m)    Body mass index is 23.44 kg/m.   General: well developed, well nourished very pleasant elderly African-American lady, seated, in no evident distress Head: head normocephalic and atraumatic.  Neck: supple with no carotid or supraclavicular bruits Cardiovascular: regular rate and rhythm, no murmurs Musculoskeletal: no deformity Skin:  no rash/petichiae  Vascular:  Normal pulses all extremities  Neurologic Exam Mental Status: Awake and fully alert. Oriented to place and time. Recent and remote memory intact. Attention span, concentration and fund of knowledge appropriate. Mood and affect appropriate.  Fluent speech and language. Cranial Nerves: Pupils equal, briskly reactive to light. Extraocular movements full without nystagmus. Visual fields full to confrontation. Hearing intact. Facial sensation intact.  Mild right nasolabial asymmetry when she smiles.,  tongue, palate moves normally and symmetrically.  Motor: Normal  bulk and tone. Normal strength in all tested extremity muscles.  Sensory.: intact to touch ,pinprick .position and vibratory sensation.  Coordination: Rapid alternating movements normal in all extremities. Finger-to-nose and heel-to-shin performed accurately bilaterally. Gait and Station: Arises from chair without difficulty. Stance is normal.  Uses a cane for ambulation gait is cautious but stable.  Mild imbalance greater with turns. Did not attempt heel, toe and tandem walk  Reflexes: 1+ and symmetric. Toes downgoing.      ASSESSMENT: 89 year old lady with embolic left MCA infarct in September 2018 secondary to left middle cerebral artery occlusion due to atrial fibrillation treated with mechanical embolectomy with complete recanalization and excellent clinical recovery.  She had a GI bleed in May 2021 and while Xarelto was held -- developed significant Bilateral popliteal artery embolisms s/p bilateral popliteal artery thrombectomies, following with vascular surgery.  Recurrent left MCA branch infarct in December 2021 due to left M2 occlusion in the setting of anticoagulation being on hold due to GI bleeding    PLAN:  -residual deficits:  -imbalance and mild cognitive impairment.   -Continue use of cane at all times for fall prevention -Cognitive impairment relatively stable, some worsening recently likely in setting of recurrent hospitalizations and infections (pneumonia, UTI and gastritis). Continue Namenda '10mg'$  BID managed by PCP. Will check b12 and TSH today.  Discussed importance of routine memory exercises at home as well as routine physical activity, ensuring good sleep and healthy diet and importance of managing stroke risk factors -Continue Eliquis 2.5 mg twice daily for secondary stroke prevention given recent history of GI bleeding on full dose anticoagulation and atorvastatin 20 mg daily for secondary stroke  prevention -Continue routine follow-up with cardiology, VVS and PCP as scheduled -goals: HTN with blood pressure goal below 130/90 and HLD with LDL cholesterol goal below 70 mg/dL.  -all medications managed by PCP/cardiology    Doing well from stroke standpoint without further recommendations and risk factors are managed by PCP. She may follow up PRN, as usual for our patients who are strictly being followed for stroke. If any new neurological issues should arise, request PCP place referral for evaluation by one of our neurologists. Thank you.     CC:  Bulla, Donald, PA-C   I spent 33 minutes of face-to-face and non-face-to-face time with patient and niece.  This included previsit chart review, lab review, study review, order entry, electronic health record documentation, patient and niece education and discussion regarding the above and answered all the questions to patient and nieces satisfaction  Frann Rider, [facility]  [facility] [address] [place], [place] [address]  Phone [phone] Fax [phone] Note: This document was prepared with digital dictation and possible smart phrase technology. Any transcriptional errors that result from this process are unintentional.

## 2022-09-25 ENCOUNTER — Ambulatory Visit (INDEPENDENT_AMBULATORY_CARE_PROVIDER_SITE_OTHER): Payer: Medicare (Managed Care) | Admitting: Adult Health

## 2022-09-25 ENCOUNTER — Encounter: Payer: Self-pay | Admitting: Adult Health

## 2022-09-25 VITALS — BP 135/83 | HR 82 | Ht 66.0 in | Wt 145.2 lb

## 2022-09-25 DIAGNOSIS — E538 Deficiency of other specified B group vitamins: Secondary | ICD-10-CM | POA: Diagnosis not present

## 2022-09-25 DIAGNOSIS — I63512 Cerebral infarction due to unspecified occlusion or stenosis of left middle cerebral artery: Secondary | ICD-10-CM

## 2022-09-25 DIAGNOSIS — R413 Other amnesia: Secondary | ICD-10-CM

## 2022-09-25 NOTE — Patient Instructions (Addendum)
We will check B12 and TSH (thyroid function) today - we will let you know the levels tomorrow once resulted  Would recommend continued use of Namenda '10mg'$  twice daily for cognitive impairment, if memory continues to decline could consider neurocognitive evaluation but will defer to PCP. Ensure you are routinely doing memory exercises at home as well as routine physical activity, ensuring good sleep and adequate diet.    Continue Eliquis (apixaban) daily  and atorvastatin for secondary stroke prevention  Continue to follow up with PCP regarding blood pressure and cholesterol management  Maintain strict control of hypertension with blood pressure goal below 130/90 and cholesterol with LDL cholesterol (bad cholesterol) goal below 70 mg/dL.   Signs of a Stroke? Follow the BEFAST method:  Balance Watch for a sudden loss of balance, trouble with coordination or vertigo Eyes Is there a sudden loss of vision in one or both eyes? Or double vision?  Face: Ask the person to smile. Does one side of the face droop or is it numb?  Arms: Ask the person to raise both arms. Does one arm drift downward? Is there weakness or numbness of a leg? Speech: Ask the person to repeat a simple phrase. Does the speech sound slurred/strange? Is the person confused ? Time: If you observe any of these signs, call 911.       Thank you for coming to see [place] at [facility]. I hope we have been able to provide you high quality care today.  You may receive a patient satisfaction survey over the next few weeks. We would appreciate your feedback and comments so that we may continue to improve ourselves and the health of our patients.    Mild Neurocognitive Disorder Mild neurocognitive disorder, formerly known as mild cognitive impairment, is a disorder in which memory does not work as well as it should. This disorder may also cause problems with other mental functions, including thought, communication,  behavior, and completion of tasks. These problems can be noticed and measured, but they usually do not interfere with daily activities or the ability to live independently. Mild neurocognitive disorder typically develops after 87 years of age, but it can also develop at younger ages. It is not as serious as major neurocognitive disorder, also known as dementia, but it may be the first sign of it. Generally, symptoms of this condition get worse over time. In rare cases, symptoms can get better. What are the causes? This condition may be caused by: Brain disorders like Alzheimer's disease, Parkinson's disease, and other conditions that gradually damage nerve cells (neurodegenerative conditions). Diseases that affect blood vessels in the brain and result in small strokes. Certain infections, such as HIV. Traumatic brain injury. Other medical conditions, such as brain tumors, underactive thyroid (hypothyroidism), and vitamin B12 deficiency. Use of certain drugs or prescription medicines. What increases the risk? The following factors may make you more likely to develop this condition: Being older than 65 years. Being female. Low education level. Diabetes, high blood pressure, high cholesterol, and other conditions that increase the risk for blood vessel diseases. Untreated or undertreated sleep apnea. Having a certain type of gene that can be passed from parent to child (inherited). Chronic health problems such as heart disease, lung disease, liver disease, kidney disease, or depression. What are the signs or symptoms? Symptoms of this condition include: Difficulty remembering. You may: Forget names, phone numbers, or details of recent events. Forget social events and appointments. Repeatedly forget where you put your car  keys or other items. Difficulty thinking and solving problems. You may have trouble with complex tasks, such as: Paying bills. Driving in unfamiliar places. Difficulty  communicating. You may have trouble: Finding the right word or naming an object. Forming a sentence that makes sense, or understanding what you read or hear. Changes in your behavior or personality. When this happens, you may: Lose interest in the things that you used to enjoy. Withdraw from social situations. Get angry more easily than usual. Act before thinking. How is this diagnosed? This condition is diagnosed based on: Your symptoms. Your health care provider may ask you and the people you spend time with, such as family and friends, about your symptoms. Evaluation of mental functions (neuropsychological testing). Your health care provider may refer you to a neurologist or mental health specialist to evaluate your mental functions in detail. To identify the cause of your condition, your health care provider may: Get a detailed medical history. Ask about use of alcohol, drugs, and prescription medicines. Do a physical exam. Order blood tests and brain imaging exams. How is this treated? Mild neurocognitive disorder that is caused by medicine use, drug use, infection, or another medical condition may improve when the cause is treated, or when medicines or drugs are stopped. If this disorder has another cause, it generally does not improve and may get worse. In these cases, the goal of treatment is to help you manage the loss of mental function. Treatments in these cases include: Medicine. Medicine mainly helps memory and behavior symptoms. Talk therapy. Talk therapy provides education, emotional support, memory aids, and other ways of making up for problems with mental function. Lifestyle changes, including: Getting regular exercise. Eating a healthy diet that includes omega-3 fatty acids. Challenging your thinking and memory skills. Having more social interaction. Follow these instructions at home: Eating and drinking  Drink enough fluid to keep your urine pale yellow. Eat a healthy  diet that includes omega-3 fatty acids. These can be found in: Fish. Nuts. Leafy vegetables. Vegetable oils. If you drink alcohol: Limit how much you use to: 0-1 drink a day for women. 0-2 drinks a day for men. Be aware of how much alcohol is in your drink. In the [place] one drink equals one 12 oz bottle of beer (355 mL), one 5 oz glass of wine (148 mL), or one 1 oz glass of hard liquor (44 mL). Lifestyle  Get regular exercise as told by your health care provider. Do not use any products that contain nicotine or tobacco, such as cigarettes, e-cigarettes, and chewing tobacco. If you need help quitting, ask your health care provider. Practice ways to manage stress. If you need help managing stress, ask your health care provider. Continue to have social interaction. Keep your mind active with stimulating activities you enjoy, such as reading or playing games. Make sure to get quality sleep. Follow these tips: Avoid napping during the day. Keep your sleeping area dark and cool. Avoid exercising during the few hours before you go to bed. Avoid caffeine products in the evening. General instructions Take over-the-counter and prescription medicines only as told by your health care provider. Your health care provider may recommend that you avoid taking medicines that can affect thinking, such as pain medicines or sleep medicines. Work with your health care provider to find out what you need help with and what your safety needs are. Keep all follow-up visits. This is important. Where to find more information [employer] on Aging: http://kim-miller.com/ Contact  a health care provider if: You have any new symptoms. Get help right away if: You develop new confusion or your confusion gets worse. You act in ways that place you or your family in danger. Summary Mild neurocognitive disorder is a disorder in which memory does not work as well as it should. Mild neurocognitive disorder can have many  causes. It may be the first stage of dementia. To manage your condition, get regular exercise, keep your mind active, get quality sleep, and eat a healthy diet. This information is not intended to replace advice given to you by your health care provider. Make sure you discuss any questions you have with your health care provider. Document Revised: 12/22/2019 Document Reviewed: 12/22/2019 Elsevier Patient Education  [place].

## 2022-09-26 ENCOUNTER — Telehealth: Payer: Self-pay | Admitting: Neurology

## 2022-09-26 LAB — TSH: TSH: 2.76 u[IU]/mL (ref 0.450–4.500)

## 2022-09-26 LAB — VITAMIN B12: Vitamin B-12: 775 pg/mL (ref 232–1245)

## 2022-09-26 NOTE — Telephone Encounter (Signed)
-----   Message from Frann Rider, NP sent at 09/26/2022  8:09 AM EST ----- Please advise patient's niece that recent lab work satisfactory, B12 775, TSH 2.760. thank you.

## 2022-09-26 NOTE — Telephone Encounter (Signed)
Called the niece and I was able to review the lab results with her. She confirmed we didn't need to schedule a follow up appt. Janett Billow advised they can follow up as needed from stroke standpoint she is being managed well by PCP. She was appreciative for the call.

## 2022-11-10 ENCOUNTER — Emergency Department (HOSPITAL_COMMUNITY)
Admission: EM | Admit: 2022-11-10 | Discharge: 2022-11-10 | Disposition: A | Discharge status: Home / Self Care | Payer: Medicare (Managed Care) | Attending: Emergency Medicine | Admitting: Emergency Medicine

## 2022-11-10 ENCOUNTER — Emergency Department (HOSPITAL_COMMUNITY): Payer: Medicare (Managed Care)

## 2022-11-10 ENCOUNTER — Other Ambulatory Visit: Payer: Self-pay

## 2022-11-10 DIAGNOSIS — F0390 Unspecified dementia without behavioral disturbance: Secondary | ICD-10-CM | POA: Insufficient documentation

## 2022-11-10 DIAGNOSIS — Z8673 Personal history of transient ischemic attack (TIA), and cerebral infarction without residual deficits: Secondary | ICD-10-CM | POA: Diagnosis not present

## 2022-11-10 DIAGNOSIS — Z7901 Long term (current) use of anticoagulants: Secondary | ICD-10-CM | POA: Diagnosis not present

## 2022-11-10 DIAGNOSIS — Z1152 Encounter for screening for COVID-19: Secondary | ICD-10-CM | POA: Insufficient documentation

## 2022-11-10 DIAGNOSIS — Z79899 Other long term (current) drug therapy: Secondary | ICD-10-CM | POA: Diagnosis not present

## 2022-11-10 DIAGNOSIS — I4891 Unspecified atrial fibrillation: Secondary | ICD-10-CM | POA: Diagnosis not present

## 2022-11-10 DIAGNOSIS — R531 Weakness: Secondary | ICD-10-CM | POA: Diagnosis not present

## 2022-11-10 LAB — CBC WITH DIFFERENTIAL/PLATELET
Abs Immature Granulocytes: 0.02 10*3/uL (ref 0.00–0.07)
Basophils Absolute: 0.1 10*3/uL (ref 0.0–0.1)
Basophils Relative: 1 %
Eosinophils Absolute: 0 10*3/uL (ref 0.0–0.5)
Eosinophils Relative: 1 %
HCT: 49.4 % — ABNORMAL HIGH (ref 36.0–46.0)
Hemoglobin: 16.4 g/dL — ABNORMAL HIGH (ref 12.0–15.0)
Immature Granulocytes: 0 %
Lymphocytes Relative: 19 %
Lymphs Abs: 1.6 10*3/uL (ref 0.7–4.0)
MCH: 28.1 pg (ref 26.0–34.0)
MCHC: 33.2 g/dL (ref 30.0–36.0)
MCV: 84.7 fL (ref 80.0–100.0)
Monocytes Absolute: 0.7 10*3/uL (ref 0.1–1.0)
Monocytes Relative: 9 %
Neutro Abs: 5.8 10*3/uL (ref 1.7–7.7)
Neutrophils Relative %: 70 %
Platelets: 181 10*3/uL (ref 150–400)
RBC: 5.83 MIL/uL — ABNORMAL HIGH (ref 3.87–5.11)
RDW: 16.3 % — ABNORMAL HIGH (ref 11.5–15.5)
WBC: 8.3 10*3/uL (ref 4.0–10.5)
nRBC: 0 % (ref 0.0–0.2)

## 2022-11-10 LAB — RESP PANEL BY RT-PCR (RSV, FLU A&B, COVID)  RVPGX2
Influenza A by PCR: NEGATIVE
Influenza B by PCR: NEGATIVE
Resp Syncytial Virus by PCR: NEGATIVE
SARS Coronavirus 2 by RT PCR: NEGATIVE

## 2022-11-10 LAB — COMPREHENSIVE METABOLIC PANEL
ALT: 17 U/L (ref 0–44)
AST: 24 U/L (ref 15–41)
Albumin: 3.8 g/dL (ref 3.5–5.0)
Alkaline Phosphatase: 68 U/L (ref 38–126)
Anion gap: 15 (ref 5–15)
BUN: 14 mg/dL (ref 8–23)
CO2: 21 mmol/L — ABNORMAL LOW (ref 22–32)
Calcium: 9 mg/dL (ref 8.9–10.3)
Chloride: 100 mmol/L (ref 98–111)
Creatinine, Ser: 1.45 mg/dL — ABNORMAL HIGH (ref 0.44–1.00)
GFR, Estimated: 35 mL/min — ABNORMAL LOW (ref >60)
Glucose, Bld: 122 mg/dL — ABNORMAL HIGH (ref 70–99)
Potassium: 4 mmol/L (ref 3.5–5.1)
Sodium: 136 mmol/L (ref 135–145)
Total Bilirubin: 1.9 mg/dL — ABNORMAL HIGH (ref 0.3–1.2)
Total Protein: 7.9 g/dL (ref 6.5–8.1)

## 2022-11-10 LAB — AMMONIA: Ammonia: 28 umol/L (ref 9–35)

## 2022-11-10 LAB — RAPID URINE DRUG SCREEN, HOSP PERFORMED
Amphetamines: NOT DETECTED
Barbiturates: NOT DETECTED
Benzodiazepines: NOT DETECTED
Cocaine: NOT DETECTED
Opiates: NOT DETECTED
Tetrahydrocannabinol: NOT DETECTED

## 2022-11-10 LAB — CBG MONITORING, ED: Glucose-Capillary: 140 mg/dL — ABNORMAL HIGH (ref 70–99)

## 2022-11-10 LAB — URINALYSIS, ROUTINE W REFLEX MICROSCOPIC
Bilirubin Urine: NEGATIVE
Glucose, UA: NEGATIVE mg/dL
Hgb urine dipstick: NEGATIVE
Ketones, ur: NEGATIVE mg/dL
Leukocytes,Ua: NEGATIVE
Nitrite: NEGATIVE
Protein, ur: 30 mg/dL — AB
Specific Gravity, Urine: 1.008 (ref 1.005–1.030)
pH: 5 (ref 5.0–8.0)

## 2022-11-10 LAB — PROTIME-INR
INR: 1.4 — ABNORMAL HIGH (ref 0.8–1.2)
Prothrombin Time: 17.3 seconds — ABNORMAL HIGH (ref 11.4–15.2)

## 2022-11-10 LAB — TROPONIN I (HIGH SENSITIVITY): Troponin I (High Sensitivity): 8 ng/L (ref <18)

## 2022-11-10 LAB — ETHANOL: Alcohol, Ethyl (B): <10 mg/dL (ref <10)

## 2022-11-10 MED ORDER — SODIUM CHLORIDE 0.9 % IV BOLUS
1000.0000 mL | Freq: Once | INTRAVENOUS | Status: AC
  Administered 2022-11-10: 1000 mL via INTRAVENOUS

## 2022-11-10 NOTE — ED Triage Notes (Signed)
Pt BIB [facility] from [facility] independent living. EMS called for dizziness and chest pain. Hx of dementia, pt has been steadily declining over the last month. Pt refused to go to MD appointment or take medication. Family stated that pt has been drinking more heavily, she denies any drinks at today. Pt has hx of stroke 2 years ago and A-Fib. Pt is AOx3, can answer questions but experiences confusion at times.  LVS  HR 90-120 AFIB  98& RA  BP 120/60  CBG 172

## 2022-11-10 NOTE — Discharge Instructions (Signed)
[facility]  [facility] Address: [address], [place], [place] [address] Phone: [phone]  [facility] Address: [address], [place], [place] [address] Phone: [phone]  [facility] Address: [address], [place], [place] [address] Phone: [phone]  [facility] Address: [address], [place], [place] [address] Phone: [phone]  [facility] Address: [address], [place], [place] [address] Phone: [phone]  [facility] Phone: [phone]  [facility] Address:  [address], [place], [place] [address] Phone:  [phone]  [facility] Address:  [address] Phone:  [phone]  [email or web address]  [facility] Phone: [phone]   

## 2022-11-10 NOTE — ED Provider Notes (Signed)
[facility] Provider Note   CSN: [id number] Arrival date & time: 11/10/22  1002     History  Chief Complaint  Patient presents with   Weakness    Amber Barnett is a 89 y.o. female.  Patient here for generalized weakness.  Felt a little bit weak this morning like her legs were giving out.  She has a history of A-fib on Eliquis.  Does not Solik she fell.  She lives in independent living but she does have some memory issues/dementia.  She denies any fevers or chills.  Per EMS sounds like she has been drinking alcohol here recently.  Family not sure how she is got in the alcohol to her facility.  Patient denies any active chest pain or shortness of breath or dizziness or weakness or numbness.  No vision changes or vision loss.  Overall she is just not feeling very well.  She does ambulate with a walker.  Is here with generalized weakness.  Maybe some dizziness and chest discomfort this morning but now resolved.  Normal vitals.  No fever.  Per EMS sounds like he has had a general decline in the last month or so.  Supposedly has not been taking medications but patient states that she has.  Family dates that she has been drinking more per EMS.  Had stroke a couple years ago.  History of A-fib.  She is on blood thinners.  The history is provided by the patient and the EMS personnel.       Home Medications Prior to Admission medications   Medication Sig Start Date End Date Taking? Authorizing Provider  albuterol (VENTOLIN HFA) 108 (90 Base) MCG/ACT inhaler Inhale 2 puffs into the lungs 4 (four) times daily as needed for wheezing or shortness of breath. 03/11/20   [provider]  apixaban (ELIQUIS) 2.5 MG TABS tablet Take by mouth 2 (two) times daily.    [provider]  atorvastatin (LIPITOR) 20 MG tablet Take 1 tablet (20 mg total) by mouth daily. 07/02/20 07/19/21  Garvin Fila, MD  Cholecalciferol (VITAMIN D3) 2000  units capsule Take 2,000 Units by mouth daily.    [provider]  diltiazem (TIAZAC) 120 MG 24 hr capsule Take 120 mg by mouth daily. 04/26/17   [provider]  diphenhydrAMINE (BENADRYL) 25 MG tablet Take 25 mg by mouth as needed for itching or allergies.    [provider]  famotidine (PEPCID) 20 MG tablet Take 20 mg by mouth daily. Patient not taking: Reported on 09/25/2022 11/27/19   [provider]  Fe Fum-FA-B Cmp-C-Zn-Mg-Mn-Cu (HEMATINIC PLUS VIT/MINERALS) 106-1 MG TABS Take 1 tablet by mouth 2 (two) times daily. 07/26/20   [provider]  furosemide (LASIX) 20 MG tablet Take 20 mg by mouth daily. MAY TAKE 2 TABLETS IF GAINS OVER 1.5 LB IN A DAY 07/01/20   [provider]  furosemide (LASIX) 20 MG tablet Take 2 tablets (40 mg total) by mouth daily for 4 days. 10/04/21 10/08/21  Deno Etienne, DO  HYDROcodone-acetaminophen (NORCO/VICODIN) 5-325 MG tablet Take 1 tablet by mouth every 6 (six) hours as needed for moderate pain. Patient not taking: Reported on 09/25/2022 07/04/22   Fredia Sorrow, MD  latanoprost (XALATAN) 0.005 % ophthalmic solution Place 1 drop into both eyes at bedtime. 10/16/16   [provider]  LORazepam (ATIVAN) 1 MG tablet Take 0.5 tablets (0.5 mg total) by mouth every 8 (eight) hours as needed for  anxiety. 08/20/20   Domenic Polite, MD  MAGNESIUM-OXIDE 400 (241.3 Mg) MG tablet Take 800 mg by mouth daily.  12/20/19   [provider]  memantine (NAMENDA) 10 MG tablet Take 10 mg by mouth 2 (two) times daily.    [provider]  pantoprazole (PROTONIX) 40 MG tablet Take 40 mg by mouth daily. 04/26/17   [provider]  potassium chloride (MICRO-K) 10 MEQ CR capsule Take 10 mEq by mouth 2 (two) times daily. 07/21/20   [provider]  predniSONE (DELTASONE) 20 MG tablet 2 tabs po daily x 4 days 10/04/21   Deno Etienne, DO  SYMBICORT 80-4.5 MCG/ACT inhaler Inhale 2 puffs into the lungs 2 (two)  times daily. 04/16/20   [provider]      Allergies    Fish-derived products, Milk-related compounds, Other, Wild lettuce extract (lactuca virosa), and Amoxicillin    Review of Systems   Review of Systems  Physical Exam Updated Vital Signs BP (!) 136/56   Pulse 73   Temp 97.7 F (36.5 C) (Oral)   Resp 19   SpO2 99%  Physical Exam Vitals and nursing note reviewed.  Constitutional:      General: She is not in acute distress.    Appearance: She is well-developed. She is not ill-appearing.  HENT:     Head: Normocephalic and atraumatic.     Nose: Nose normal.  Eyes:     Extraocular Movements: Extraocular movements intact.     Conjunctiva/sclera: Conjunctivae normal.     Pupils: Pupils are equal, round, and reactive to light.  Cardiovascular:     Rate and Rhythm: Normal rate and regular rhythm.     Pulses: Normal pulses.     Heart sounds: No murmur heard. Pulmonary:     Effort: Pulmonary effort is normal. No respiratory distress.     Breath sounds: Normal breath sounds.  Abdominal:     Palpations: Abdomen is soft.     Tenderness: There is no abdominal tenderness.  Musculoskeletal:        General: No swelling.     Cervical back: Normal range of motion and neck supple.  Skin:    General: Skin is warm and dry.     Capillary Refill: Capillary refill takes less than 2 seconds.  Neurological:     General: No focal deficit present.     Mental Status: She is alert and oriented to person, place, and time.     Cranial Nerves: No cranial nerve deficit.     Sensory: No sensory deficit.     Motor: No weakness.     Coordination: Coordination normal.     Comments: 5+ out of 5 strength throughout, normal sensation, no drift, normal finger-nose-finger, normal speech  Psychiatric:        Mood and Affect: Mood normal.     ED Results / Procedures / Treatments   Labs (all labs ordered are listed, but only abnormal results are displayed) Labs Reviewed  CBC WITH  DIFFERENTIAL/PLATELET - Abnormal; Notable for the following components:      Result Value   RBC 5.83 (*)    Hemoglobin 16.4 (*)    HCT 49.4 (*)    RDW 16.3 (*)    All other components within normal limits  COMPREHENSIVE METABOLIC PANEL - Abnormal; Notable for the following components:   CO2 21 (*)    Glucose, Bld 122 (*)    Creatinine, Ser 1.45 (*)    Total Bilirubin 1.9 (*)  GFR, Estimated 35 (*)    All other components within normal limits  URINALYSIS, ROUTINE W REFLEX MICROSCOPIC - Abnormal; Notable for the following components:   APPearance HAZY (*)    Protein, ur 30 (*)    Bacteria, UA RARE (*)    All other components within normal limits  PROTIME-INR - Abnormal; Notable for the following components:   Prothrombin Time 17.3 (*)    INR 1.4 (*)    All other components within normal limits  CBG MONITORING, ED - Abnormal; Notable for the following components:   Glucose-Capillary 140 (*)    All other components within normal limits  RESP PANEL BY RT-PCR (RSV, FLU A&B, COVID)  RVPGX2  AMMONIA  ETHANOL  RAPID URINE DRUG SCREEN, HOSP PERFORMED  TROPONIN I (HIGH SENSITIVITY)    EKG EKG Interpretation  Date/Time:  Friday November 10 2022 10:15:05 EDT Ventricular Rate:  95 PR Interval:    QRS Duration: 85 QT Interval:  348 QTC Calculation: 438 R Axis:   266 Text Interpretation: Atrial fibrillation Left anterior fascicular block Probable right ventricular hypertrophy Borderline T abnormalities, anterior leads Confirmed by Lennice Sites ([id number]) on 11/10/2022 10:18:45 AM  Radiology CT Head Wo Contrast  Result Date: 11/10/2022 CLINICAL DATA:  Syncope/presyncope, cerebrovascular cause suspected. EXAM: CT HEAD WITHOUT CONTRAST TECHNIQUE: Contiguous axial images were obtained from the base of the skull through the vertex without intravenous contrast. RADIATION DOSE REDUCTION: This exam was performed according to the departmental dose-optimization program which includes automated  exposure control, adjustment of the mA and/or kV according to patient size and/or use of iterative reconstruction technique. COMPARISON:  MRI brain 08/14/2020.  CTA head/neck 08/14/2020. FINDINGS: Brain: No acute intracranial hemorrhage. Encephalomalacia along the right frontal operculum from prior right MCA territory infarct. Underlying mild chronic small-vessel disease. No hydrocephalus or extra-axial collection. No mass or midline shift. Vascular: No hyperdense vessel or unexpected calcification. Skull: No calvarial fracture or suspicious bone lesion. Skull base is unremarkable. Sinuses/Orbits: Trace fluid in the left sphenoid sinus. Visualized portions of the orbits are unremarkable. Other: None. IMPRESSION: 1. No acute intracranial abnormality. 2. Old right MCA territory infarct. Electronically Signed   By: Emmit Alexanders M.D.   On: 11/10/2022 11:28   DG Chest Portable 1 View  Result Date: 11/10/2022 CLINICAL DATA:  Dizziness, weakness. EXAM: PORTABLE CHEST 1 VIEW COMPARISON:  10/04/2021. FINDINGS: No focal airspace opacity. Stable cardiomegaly and mediastinal contours. The left costophrenic sulcus is excluded from the field of view. No large pleural effusion or pneumothorax. IMPRESSION: No evidence of acute cardiopulmonary disease. Electronically Signed   By: Emmit Alexanders M.D.   On: 11/10/2022 11:09    Procedures Procedures    Medications Ordered in ED Medications  sodium chloride 0.9 % bolus 1,000 mL (0 mLs Intravenous Stopped 11/10/22 1302)    ED Course/ Medical Decision Making/ A&P                             Medical Decision Making  Dhrithi Demoranville is here with generalized weakness.  Normal vitals.  No fever.  History of A-fib on Eliquis history of stroke, dementia.  She lives at independent living.  Her legs gave out on her while she was trying to walk with her walker today.  She did not feel well.  Feeling better now.  She has no symptoms.  No chest pain or shortness of  breath or dizziness.  Denies any weakness or numbness.  She overall is awake and alert and can give me a good history.  She knows where she is.  Per EMS she has been may be drinking alcohol more heavily recently per family.  Her vital signs are normal.  Her EKG shows rate controlled atrial fibrillation.  Will do broad workup with CBC, BMP, urinalysis, chest x-ray, head CT, troponin to evaluate.  Will talk with family.  Will rule out ACS, have no concern for stroke, will evaluate for infectious process as a part of the differential diagnosis.  Per my review and interpretation of labs no significant anemia or electrolyte abnormality or kidney injury.  Troponin is normal.  No urine infection.  No pneumonia.  Head CT is unremarkable per radiology report.  I reviewed interpreted all labs and imaging.  Social work and case management got involved and will help provide some more resources for home health.  Recommend follow-up with primary care doctor.  Patient discharged in good condition.  Concern for ACS or stroke or infectious process.  This chart was dictated using voice recognition software.  Despite best efforts to proofread,  errors can occur which can change the documentation meaning.         Final Clinical Impression(s) / ED Diagnoses Final diagnoses:  Weakness    Rx / DC Orders ED Discharge Orders     None         Lennice Sites, DO 11/10/22 1439

## 2022-11-10 NOTE — Progress Notes (Addendum)
CSW spoke with patients niece, Amber Barnett, [phone]. [name] stated she feels patient needs a home aide because sometimes patients does not take her medications and refuses to go to the doctors. CSW asked patient why she doesn't go to her doctors appointments and she stated because she isn't feeling bad. CSW told patient regardless if she isn't feeling bad she needs to go to those appointments. Patient stated okay and that she would cooperate with her niece. [name] stated that she would like her insurance to pay for the home aide services. CSW told Amber Barnett that most likely she would have to pay privately. CSW told Amber Barnett she would give her a Doctor, general practice. CSW also asked if patient qualified for medicaid. [name] stated no and patient makes well over $2,000 each month. [name] stated she has been making sure patient takes her medicine. [name] stated she had an agency coming in before but they stated patient didn't really need it because she is independent. AmberBarnett stated patient gets around pretty good on her own. [name] plans on taking patient to the PCP for a dementia evaluation. Private duty home aide services have also been attached to the [facility].

## 2022-11-28 ENCOUNTER — Emergency Department (HOSPITAL_COMMUNITY): Payer: Medicare (Managed Care)

## 2022-11-28 ENCOUNTER — Encounter (HOSPITAL_COMMUNITY): Payer: Self-pay | Admitting: *Deleted

## 2022-11-28 ENCOUNTER — Emergency Department (HOSPITAL_COMMUNITY)
Admission: EM | Admit: 2022-11-28 | Discharge: 2022-11-28 | Disposition: A | Discharge status: Home / Self Care | Payer: Medicare (Managed Care) | Attending: Emergency Medicine | Admitting: Emergency Medicine

## 2022-11-28 ENCOUNTER — Other Ambulatory Visit: Payer: Self-pay

## 2022-11-28 DIAGNOSIS — Z7902 Long term (current) use of antithrombotics/antiplatelets: Secondary | ICD-10-CM | POA: Insufficient documentation

## 2022-11-28 DIAGNOSIS — Z1152 Encounter for screening for COVID-19: Secondary | ICD-10-CM | POA: Insufficient documentation

## 2022-11-28 DIAGNOSIS — Z79899 Other long term (current) drug therapy: Secondary | ICD-10-CM | POA: Diagnosis not present

## 2022-11-28 DIAGNOSIS — I10 Essential (primary) hypertension: Secondary | ICD-10-CM | POA: Insufficient documentation

## 2022-11-28 DIAGNOSIS — R55 Syncope and collapse: Secondary | ICD-10-CM | POA: Insufficient documentation

## 2022-11-28 DIAGNOSIS — Z8673 Personal history of transient ischemic attack (TIA), and cerebral infarction without residual deficits: Secondary | ICD-10-CM | POA: Diagnosis not present

## 2022-11-28 LAB — URINALYSIS, ROUTINE W REFLEX MICROSCOPIC
Glucose, UA: NEGATIVE mg/dL
Hgb urine dipstick: NEGATIVE
Ketones, ur: 5 mg/dL — AB
Nitrite: NEGATIVE
Protein, ur: 100 mg/dL — AB
Specific Gravity, Urine: 1.026 (ref 1.005–1.030)
pH: 5 (ref 5.0–8.0)

## 2022-11-28 LAB — TROPONIN I (HIGH SENSITIVITY): Troponin I (High Sensitivity): 7 ng/L (ref <18)

## 2022-11-28 LAB — CBC WITH DIFFERENTIAL/PLATELET
Abs Immature Granulocytes: 0.02 10*3/uL (ref 0.00–0.07)
Basophils Absolute: 0 10*3/uL (ref 0.0–0.1)
Basophils Relative: 1 %
Eosinophils Absolute: 0.1 10*3/uL (ref 0.0–0.5)
Eosinophils Relative: 2 %
HCT: 45.4 % (ref 36.0–46.0)
Hemoglobin: 14.9 g/dL (ref 12.0–15.0)
Immature Granulocytes: 0 %
Lymphocytes Relative: 21 %
Lymphs Abs: 1.5 10*3/uL (ref 0.7–4.0)
MCH: 28.5 pg (ref 26.0–34.0)
MCHC: 32.8 g/dL (ref 30.0–36.0)
MCV: 86.8 fL (ref 80.0–100.0)
Monocytes Absolute: 0.4 10*3/uL (ref 0.1–1.0)
Monocytes Relative: 5 %
Neutro Abs: 5.3 10*3/uL (ref 1.7–7.7)
Neutrophils Relative %: 71 %
Platelets: 208 10*3/uL (ref 150–400)
RBC: 5.23 MIL/uL — ABNORMAL HIGH (ref 3.87–5.11)
RDW: 15.2 % (ref 11.5–15.5)
WBC: 7.3 10*3/uL (ref 4.0–10.5)
nRBC: 0 % (ref 0.0–0.2)

## 2022-11-28 LAB — COMPREHENSIVE METABOLIC PANEL
ALT: 18 U/L (ref 0–44)
AST: 26 U/L (ref 15–41)
Albumin: 3.5 g/dL (ref 3.5–5.0)
Alkaline Phosphatase: 58 U/L (ref 38–126)
Anion gap: 12 (ref 5–15)
BUN: 19 mg/dL (ref 8–23)
CO2: 23 mmol/L (ref 22–32)
Calcium: 8.9 mg/dL (ref 8.9–10.3)
Chloride: 107 mmol/L (ref 98–111)
Creatinine, Ser: 1.72 mg/dL — ABNORMAL HIGH (ref 0.44–1.00)
GFR, Estimated: 29 mL/min — ABNORMAL LOW (ref >60)
Glucose, Bld: 141 mg/dL — ABNORMAL HIGH (ref 70–99)
Potassium: 3.7 mmol/L (ref 3.5–5.1)
Sodium: 142 mmol/L (ref 135–145)
Total Bilirubin: 0.6 mg/dL (ref 0.3–1.2)
Total Protein: 6.8 g/dL (ref 6.5–8.1)

## 2022-11-28 LAB — RESP PANEL BY RT-PCR (RSV, FLU A&B, COVID)  RVPGX2
Influenza A by PCR: NEGATIVE
Influenza B by PCR: NEGATIVE
Resp Syncytial Virus by PCR: NEGATIVE
SARS Coronavirus 2 by RT PCR: NEGATIVE

## 2022-11-28 MED ORDER — SODIUM CHLORIDE 0.9 % IV BOLUS
500.0000 mL | Freq: Once | INTRAVENOUS | Status: AC
  Administered 2022-11-28: 500 mL via INTRAVENOUS

## 2022-11-28 NOTE — ED Provider Notes (Signed)
[facility] Provider Note   CSN: [id number] Arrival date & time: 11/28/22  1604     History  Chief Complaint  Patient presents with   Near Syncope    Amber Barnett is a 89 y.o. female.  Patient here after episode of lightheadedness at her facility.  Sounds like she got hot and flushed and warm while playing bingo at her nursing home.  She is adamant she did not pass out.  She does have memory issues at baseline as well as A-fib on Eliquis, hypertension, stroke.  She has no complaints currently.  She did throw up once after her episode of feeling lightheaded and nausea but that has passed.  She denies any chest pain or shortness of breath or headache or cough or sputum production or fever or chills.  She is feeling better after IV fluids with EMS.  They found her with a blood pressure of 90/50.  However blood pressure now 120/60.  Her blood sugar was normal.  Her heart rate was in the normal rate as well.  She is ambulatory in the room.  She states that she has been in her normal state of health.  The history is provided by the patient.       Home Medications Prior to Admission medications   Medication Sig Start Date End Date Taking? Authorizing Provider  albuterol (VENTOLIN HFA) 108 (90 Base) MCG/ACT inhaler Inhale 2 puffs into the lungs 4 (four) times daily as needed for wheezing or shortness of breath. 03/11/20   [provider]  apixaban (ELIQUIS) 2.5 MG TABS tablet Take by mouth 2 (two) times daily.    [provider]  atorvastatin (LIPITOR) 20 MG tablet Take 1 tablet (20 mg total) by mouth daily. 07/02/20 07/19/21  Micki Riley, MD  Cholecalciferol (VITAMIN D3) 2000 units capsule Take 2,000 Units by mouth daily.    [provider]  diltiazem (TIAZAC) 120 MG 24 hr capsule Take 120 mg by mouth daily. 04/26/17   [provider]  diphenhydrAMINE (BENADRYL) 25 MG tablet Take 25 mg by mouth as  needed for itching or allergies.    [provider]  famotidine (PEPCID) 20 MG tablet Take 20 mg by mouth daily. Patient not taking: Reported on 09/25/2022 11/27/19   [provider]  Fe Fum-FA-B Cmp-C-Zn-Mg-Mn-Cu (HEMATINIC PLUS VIT/MINERALS) 106-1 MG TABS Take 1 tablet by mouth 2 (two) times daily. 07/26/20   [provider]  furosemide (LASIX) 20 MG tablet Take 20 mg by mouth daily. MAY TAKE 2 TABLETS IF GAINS OVER 1.5 LB IN A DAY 07/01/20   [provider]  furosemide (LASIX) 20 MG tablet Take 2 tablets (40 mg total) by mouth daily for 4 days. 10/04/21 10/08/21  Melene Plan, DO  HYDROcodone-acetaminophen (NORCO/VICODIN) 5-325 MG tablet Take 1 tablet by mouth every 6 (six) hours as needed for moderate pain. Patient not taking: Reported on 09/25/2022 07/04/22   Vanetta Mulders, MD  latanoprost (XALATAN) 0.005 % ophthalmic solution Place 1 drop into both eyes at bedtime. 10/16/16   [provider]  LORazepam (ATIVAN) 1 MG tablet Take 0.5 tablets (0.5 mg total) by mouth every 8 (eight) hours as needed for anxiety. 08/20/20   Zannie Cove, MD  MAGNESIUM-OXIDE 400 (241.3 Mg) MG tablet Take 800 mg by mouth daily.  12/20/19   [provider]  memantine (NAMENDA) 10 MG tablet Take 10 mg by mouth 2 (two) times daily.    [provider]  pantoprazole (PROTONIX) 40 MG tablet Take 40 mg by mouth daily. 04/26/17   [provider]  potassium chloride (MICRO-K) 10 MEQ CR capsule Take 10 mEq by mouth 2 (two) times daily. 07/21/20   [provider]  predniSONE (DELTASONE) 20 MG tablet 2 tabs po daily x 4 days 10/04/21   Melene Plan, DO  SYMBICORT 80-4.5 MCG/ACT inhaler Inhale 2 puffs into the lungs 2 (two) times daily. 04/16/20   [provider]      Allergies    Fish-derived products, Milk-related compounds, Other, Wild lettuce extract (lactuca virosa), and Amoxicillin    Review of Systems   Review of Systems  Physical  Exam Updated Vital Signs BP 124/89   Pulse (!) 57   Temp (!) 97.4 F (36.3 C) (Oral)   Resp (!) 23   Wt 65.8 kg   SpO2 100%   BMI 23.40 kg/m  Physical Exam Vitals and nursing note reviewed.  Constitutional:      General: She is not in acute distress.    Appearance: She is well-developed. She is not ill-appearing.  HENT:     Head: Normocephalic and atraumatic.     Mouth/Throat:     Mouth: Mucous membranes are moist.  Eyes:     Extraocular Movements: Extraocular movements intact.     Conjunctiva/sclera: Conjunctivae normal.     Pupils: Pupils are equal, round, and reactive to light.  Cardiovascular:     Rate and Rhythm: Normal rate and regular rhythm.     Pulses: Normal pulses.     Heart sounds: Normal heart sounds. No murmur heard. Pulmonary:     Effort: Pulmonary effort is normal. No respiratory distress.     Breath sounds: Normal breath sounds.  Abdominal:     General: Abdomen is flat.     Palpations: Abdomen is soft.     Tenderness: There is no abdominal tenderness.  Musculoskeletal:        General: No swelling or tenderness. Normal range of motion.     Cervical back: Normal range of motion. No tenderness.  Skin:    General: Skin is warm and dry.     Capillary Refill: Capillary refill takes less than 2 seconds.  Neurological:     General: No focal deficit present.     Mental Status: She is alert and oriented to person, place, and time.     Cranial Nerves: No cranial nerve deficit.     Sensory: No sensory deficit.     Motor: No weakness.     Coordination: Coordination normal.     Comments: 5+ out of 5 strength throughout, normal sensation, no drift, normal finger-to-nose finger, normal speech, normal visual fields, normal gait  Psychiatric:        Mood and Affect: Mood normal.     ED Results / Procedures / Treatments   Labs (all labs ordered are listed, but only abnormal results are displayed) Labs Reviewed  CBC WITH DIFFERENTIAL/PLATELET - Abnormal; Notable  for the following components:      Result Value   RBC 5.23 (*)    All other components within normal limits  COMPREHENSIVE METABOLIC PANEL - Abnormal; Notable for the following components:   Glucose, Bld 141 (*)    Creatinine, Ser 1.72 (*)    GFR, Estimated 29 (*)    All other components within normal limits  URINALYSIS, ROUTINE W REFLEX MICROSCOPIC - Abnormal; Notable for the following components:   Color, Urine AMBER (*)  APPearance CLOUDY (*)    Bilirubin Urine SMALL (*)    Ketones, ur 5 (*)    Protein, ur 100 (*)    Leukocytes,Ua SMALL (*)    Bacteria, UA FEW (*)    All other components within normal limits  RESP PANEL BY RT-PCR (RSV, FLU A&B, COVID)  [id number]  TROPONIN I (HIGH SENSITIVITY)    EKG EKG Interpretation  Date/Time:  Tuesday November 28 2022 16:18:37 EDT Ventricular Rate:  65 PR Interval:    QRS Duration: 101 QT Interval:  386 QTC Calculation: 402 R Axis:   -88 Text Interpretation: Atrial fibrillation Left anterior fascicular block Anterior infarct, age indeterminate Lateral leads are also involved Lateral t wave inversions seen in past Confirmed by Virgina Norfolk ([id number]) on 11/28/2022 4:20:58 PM  Radiology CT Head Wo Contrast  Result Date: 11/28/2022 CLINICAL DATA:  Mental status change, unknown cause EXAM: CT HEAD WITHOUT CONTRAST TECHNIQUE: Contiguous axial images were obtained from the base of the skull through the vertex without intravenous contrast. RADIATION DOSE REDUCTION: This exam was performed according to the departmental dose-optimization program which includes automated exposure control, adjustment of the mA and/or kV according to patient size and/or use of iterative reconstruction technique. COMPARISON:  None Available. FINDINGS: Brain: There is atrophy and chronic small vessel disease changes. No acute intracranial abnormality. Specifically, no hemorrhage, hydrocephalus, mass lesion, acute infarction, or significant intracranial injury. Vascular: No  hyperdense vessel or unexpected calcification. Skull: No acute calvarial abnormality. Sinuses/Orbits: No acute findings Other: None IMPRESSION: Atrophy, chronic microvascular disease. No acute intracranial abnormality. Electronically Signed   By: Charlett Nose M.D.   On: 11/28/2022 19:22   DG Chest Portable 1 View  Result Date: 11/28/2022 CLINICAL DATA:  Light headed EXAM: PORTABLE CHEST 1 VIEW COMPARISON:  11/10/2022, 10/04/2021 FINDINGS: Cardiomegaly with aortic atherosclerosis. No pleural effusion or pneumothorax. Mild right basilar opacity. IMPRESSION: Mild right basilar opacity may be atelectasis or pneumonia. Cardiomegaly. Electronically Signed   By: Jasmine Pang M.D.   On: 11/28/2022 16:39    Procedures Procedures    Medications Ordered in ED Medications  sodium chloride 0.9 % bolus 500 mL (0 mLs Intravenous Stopped 11/28/22 1823)    ED Course/ Medical Decision Making/ A&P                             Medical Decision Making Amount and/or Complexity of Data Reviewed Labs: ordered. Radiology: ordered.   Lisvet Glackin is here after syncope type event.  Normal vitals.  No fever.  EKG per my review and interpretation shows atrial fibrillation but rate controlled.  She has some T wave inversions laterally that are seen on prior EKGs.  She is asymptomatic now.  She sounds like she had a prodrome of lightheadedness, warmth, nausea and then she threw up.  She does not think she fully passed out.  She does have a history of may be some memory issues, stroke, A-fib on Eliquis.  Her blood pressure was 90 systolic with EMS and they gave her 500 cc of fluid.  They gave her IV Zofran.  She overall appears well.  She is neurologically intact.  No recent illness.  Talked with family on the phone and states that she has been doing well.  Will do a broad workup to rule out differential diagnosis of electrolyte abnormality, dehydration, infectious process, head injury.  Will get CBC, CMP,  troponin, chest x-ray, head CT.  Will give IV  fluid bolus.  Will check urinalysis.  Will reevaluate.  Seems less likely that this was a cardiac or dysrhythmic process given her prodrome.  Orthostatics are normal upon arrival.  Per my review and interpretation of labs no significant anemia or electrolyte abnormality or kidney injury or leukocytosis.  No urine infection.  No pneumonia.  Head CT is unremarkable.  Overall she has been at her baseline throughout my care.  I reviewed interpreted all labs and imaging.  EKG shows sinus rhythm.  No ischemic changes.  Troponin was normal.  Overall I have no concern for acute process.  Overall suspect vasovagal response.  Discharged in good condition.  This chart was dictated using voice recognition software.  Despite best efforts to proofread,  errors can occur which can change the documentation meaning.         Final Clinical Impression(s) / ED Diagnoses Final diagnoses:  Near syncope    Rx / DC Orders ED Discharge Orders     None         Virgina NorfolkCuratolo, Amber Boivin, DO 11/28/22 2135

## 2022-11-28 NOTE — ED Notes (Signed)
No changes, remains alert, NAD, calm, interactive. Denies current complaints. VSS. Family at [facility]. Repeat troponin sent. Pt to CT via stretcher.

## 2022-11-28 NOTE — ED Triage Notes (Signed)
BIB [facility] from [facility] s/p near syncope vs. Syncope, "reported sunk down in chair while sitting while playing bingo", vomited x1. Zofran given PTA. no fall, no LOC, no injury, no pain. Initial BP 90/60, up to 110/70 after NS 500cc IVF.  BS 150. VSS. Afib with h/o afib. Arrives alert, NAD, calm, cooperative, interactive. EDP at [facility] upon arrival.

## 2023-01-08 NOTE — Progress Notes (Signed)
ok 

## 2024-01-03 ENCOUNTER — Other Ambulatory Visit: Payer: Self-pay

## 2024-01-03 ENCOUNTER — Observation Stay (HOSPITAL_COMMUNITY)
Admission: EM | Admit: 2024-01-03 | Discharge: 2024-01-05 | Disposition: A | Discharge status: Home / Self Care | Payer: Medicare (Managed Care) | Attending: Internal Medicine | Admitting: Internal Medicine

## 2024-01-03 DIAGNOSIS — E8721 Acute metabolic acidosis: Secondary | ICD-10-CM | POA: Diagnosis not present

## 2024-01-03 DIAGNOSIS — I160 Hypertensive urgency: Secondary | ICD-10-CM | POA: Diagnosis not present

## 2024-01-03 DIAGNOSIS — I4821 Permanent atrial fibrillation: Secondary | ICD-10-CM | POA: Diagnosis not present

## 2024-01-03 DIAGNOSIS — J439 Emphysema, unspecified: Secondary | ICD-10-CM | POA: Diagnosis not present

## 2024-01-03 DIAGNOSIS — R0902 Hypoxemia: Secondary | ICD-10-CM | POA: Diagnosis not present

## 2024-01-03 DIAGNOSIS — R0602 Shortness of breath: Secondary | ICD-10-CM | POA: Diagnosis present

## 2024-01-03 DIAGNOSIS — Z8659 Personal history of other mental and behavioral disorders: Secondary | ICD-10-CM | POA: Diagnosis not present

## 2024-01-03 DIAGNOSIS — I110 Hypertensive heart disease with heart failure: Secondary | ICD-10-CM | POA: Diagnosis not present

## 2024-01-03 DIAGNOSIS — Z8709 Personal history of other diseases of the respiratory system: Secondary | ICD-10-CM | POA: Insufficient documentation

## 2024-01-03 DIAGNOSIS — E785 Hyperlipidemia, unspecified: Secondary | ICD-10-CM | POA: Insufficient documentation

## 2024-01-03 DIAGNOSIS — Z87891 Personal history of nicotine dependence: Secondary | ICD-10-CM | POA: Diagnosis not present

## 2024-01-03 DIAGNOSIS — R55 Syncope and collapse: Secondary | ICD-10-CM | POA: Diagnosis not present

## 2024-01-03 DIAGNOSIS — I5033 Acute on chronic diastolic (congestive) heart failure: Secondary | ICD-10-CM | POA: Diagnosis not present

## 2024-01-03 DIAGNOSIS — E872 Acidosis, unspecified: Secondary | ICD-10-CM | POA: Diagnosis present

## 2024-01-03 DIAGNOSIS — I10 Essential (primary) hypertension: Secondary | ICD-10-CM

## 2024-01-03 DIAGNOSIS — F39 Unspecified mood [affective] disorder: Secondary | ICD-10-CM | POA: Insufficient documentation

## 2024-01-03 DIAGNOSIS — R7989 Other specified abnormal findings of blood chemistry: Secondary | ICD-10-CM | POA: Diagnosis present

## 2024-01-03 DIAGNOSIS — Z79899 Other long term (current) drug therapy: Secondary | ICD-10-CM | POA: Insufficient documentation

## 2024-01-03 DIAGNOSIS — I272 Pulmonary hypertension, unspecified: Secondary | ICD-10-CM | POA: Diagnosis present

## 2024-01-03 DIAGNOSIS — Z8673 Personal history of transient ischemic attack (TIA), and cerebral infarction without residual deficits: Secondary | ICD-10-CM

## 2024-01-03 DIAGNOSIS — K219 Gastro-esophageal reflux disease without esophagitis: Secondary | ICD-10-CM | POA: Insufficient documentation

## 2024-01-03 DIAGNOSIS — J90 Pleural effusion, not elsewhere classified: Secondary | ICD-10-CM | POA: Diagnosis not present

## 2024-01-03 NOTE — ED Notes (Signed)
 Pt now reporting that she's felt intermittently dizzy x the last day (room spinning).  Able to ambulate to the bathroom without any dizziness. Noted to have sats around 89% post walk. Placed on 1L [place].

## 2024-01-03 NOTE — ED Triage Notes (Signed)
 BIB EMS from [facility]. Staff reports checked her vitals tonight and her BP was high. Pt denies headache, blurry vision.  Noted to be in a-fib with EMS (90-130). Pt has zero complaints.

## 2024-01-04 ENCOUNTER — Emergency Department (HOSPITAL_COMMUNITY): Payer: Medicare (Managed Care)

## 2024-01-04 ENCOUNTER — Observation Stay (HOSPITAL_COMMUNITY): Payer: Medicare (Managed Care)

## 2024-01-04 DIAGNOSIS — I5033 Acute on chronic diastolic (congestive) heart failure: Secondary | ICD-10-CM

## 2024-01-04 DIAGNOSIS — I272 Pulmonary hypertension, unspecified: Secondary | ICD-10-CM | POA: Diagnosis present

## 2024-01-04 DIAGNOSIS — Z8673 Personal history of transient ischemic attack (TIA), and cerebral infarction without residual deficits: Secondary | ICD-10-CM

## 2024-01-04 DIAGNOSIS — R7989 Other specified abnormal findings of blood chemistry: Secondary | ICD-10-CM | POA: Diagnosis present

## 2024-01-04 DIAGNOSIS — I160 Hypertensive urgency: Secondary | ICD-10-CM | POA: Diagnosis not present

## 2024-01-04 DIAGNOSIS — E872 Acidosis, unspecified: Secondary | ICD-10-CM | POA: Diagnosis present

## 2024-01-04 LAB — TROPONIN I (HIGH SENSITIVITY)
Troponin I (High Sensitivity): 20 ng/L — ABNORMAL HIGH (ref <18)
Troponin I (High Sensitivity): 23 ng/L — ABNORMAL HIGH (ref <18)

## 2024-01-04 LAB — URINALYSIS, ROUTINE W REFLEX MICROSCOPIC
Bilirubin Urine: NEGATIVE
Glucose, UA: NEGATIVE mg/dL
Hgb urine dipstick: NEGATIVE
Ketones, ur: NEGATIVE mg/dL
Leukocytes,Ua: NEGATIVE
Nitrite: NEGATIVE
Protein, ur: >=300 mg/dL — AB
Specific Gravity, Urine: 1.009 (ref 1.005–1.030)
pH: 7 (ref 5.0–8.0)

## 2024-01-04 LAB — ECHOCARDIOGRAM COMPLETE
Area-P 1/2: 4.17 cm2
Height: 66 in
P 1/2 time: 666 ms
S' Lateral: 2.3 cm
Weight: 2080 [oz_av]

## 2024-01-04 LAB — CBC WITH DIFFERENTIAL/PLATELET
Abs Immature Granulocytes: 0.02 10*3/uL (ref 0.00–0.07)
Basophils Absolute: 0.1 10*3/uL (ref 0.0–0.1)
Basophils Relative: 1 %
Eosinophils Absolute: 0.1 10*3/uL (ref 0.0–0.5)
Eosinophils Relative: 1 %
HCT: 33.6 % — ABNORMAL LOW (ref 36.0–46.0)
Hemoglobin: 11.1 g/dL — ABNORMAL LOW (ref 12.0–15.0)
Immature Granulocytes: 0 %
Lymphocytes Relative: 19 %
Lymphs Abs: 1.3 10*3/uL (ref 0.7–4.0)
MCH: 29.7 pg (ref 26.0–34.0)
MCHC: 33 g/dL (ref 30.0–36.0)
MCV: 89.8 fL (ref 80.0–100.0)
Monocytes Absolute: 0.6 10*3/uL (ref 0.1–1.0)
Monocytes Relative: 9 %
Neutro Abs: 4.9 10*3/uL (ref 1.7–7.7)
Neutrophils Relative %: 70 %
Platelets: 179 10*3/uL (ref 150–400)
RBC: 3.74 MIL/uL — ABNORMAL LOW (ref 3.87–5.11)
RDW: 22.1 % — ABNORMAL HIGH (ref 11.5–15.5)
WBC: 7 10*3/uL (ref 4.0–10.5)
nRBC: 0 % (ref 0.0–0.2)

## 2024-01-04 LAB — BASIC METABOLIC PANEL WITH GFR
Anion gap: 11 (ref 5–15)
BUN: 11 mg/dL (ref 8–23)
CO2: 19 mmol/L — ABNORMAL LOW (ref 22–32)
Calcium: 8.6 mg/dL — ABNORMAL LOW (ref 8.9–10.3)
Chloride: 105 mmol/L (ref 98–111)
Creatinine, Ser: 0.91 mg/dL (ref 0.44–1.00)
GFR, Estimated: >60 mL/min (ref >60)
Glucose, Bld: 105 mg/dL — ABNORMAL HIGH (ref 70–99)
Potassium: 4 mmol/L (ref 3.5–5.1)
Sodium: 135 mmol/L (ref 135–145)

## 2024-01-04 LAB — D-DIMER, QUANTITATIVE: D-Dimer, Quant: 1.27 ug{FEU}/mL — ABNORMAL HIGH (ref 0.00–0.50)

## 2024-01-04 LAB — BRAIN NATRIURETIC PEPTIDE: B Natriuretic Peptide: 665.1 pg/mL — ABNORMAL HIGH (ref 0.0–100.0)

## 2024-01-04 MED ORDER — ONDANSETRON HCL 4 MG PO TABS: 4.0000 mg | ORAL_TABLET | Freq: Four times a day (QID) | ORAL | Status: DC | PRN

## 2024-01-04 MED ORDER — FUROSEMIDE 10 MG/ML IJ SOLN
20.0000 mg | Freq: Once | INTRAMUSCULAR | Status: AC
  Administered 2024-01-04: 20 mg via INTRAVENOUS
  Filled 2024-01-04: qty 2

## 2024-01-04 MED ORDER — ISOSORBIDE MONONITRATE ER 30 MG PO TB24
30.0000 mg | ORAL_TABLET | Freq: Every day | ORAL | Status: DC
  Administered 2024-01-04 – 2024-01-05 (×2): 30 mg via ORAL
  Filled 2024-01-04 (×2): qty 1

## 2024-01-04 MED ORDER — PANTOPRAZOLE SODIUM 40 MG PO TBEC
40.0000 mg | DELAYED_RELEASE_TABLET | Freq: Every day | ORAL | Status: DC
  Administered 2024-01-04 – 2024-01-05 (×2): 40 mg via ORAL
  Filled 2024-01-04 (×2): qty 1

## 2024-01-04 MED ORDER — FAMOTIDINE 20 MG PO TABS
20.0000 mg | ORAL_TABLET | Freq: Every day | ORAL | Status: DC
  Administered 2024-01-04: 20 mg via ORAL
  Filled 2024-01-04: qty 1

## 2024-01-04 MED ORDER — HYDRALAZINE HCL 20 MG/ML IJ SOLN
5.0000 mg | Freq: Once | INTRAMUSCULAR | Status: AC
  Administered 2024-01-04: 5 mg via INTRAVENOUS
  Filled 2024-01-04: qty 1

## 2024-01-04 MED ORDER — ACETAMINOPHEN 325 MG PO TABS: 650.0000 mg | ORAL_TABLET | Freq: Four times a day (QID) | ORAL | Status: DC | PRN

## 2024-01-04 MED ORDER — ROSUVASTATIN CALCIUM 5 MG PO TABS
5.0000 mg | ORAL_TABLET | Freq: Every day | ORAL | Status: DC
  Administered 2024-01-04: 5 mg via ORAL
  Filled 2024-01-04: qty 1

## 2024-01-04 MED ORDER — HYDRALAZINE HCL 20 MG/ML IJ SOLN: 10.0000 mg | INTRAMUSCULAR | Status: DC | PRN

## 2024-01-04 MED ORDER — APIXABAN 2.5 MG PO TABS
2.5000 mg | ORAL_TABLET | Freq: Two times a day (BID) | ORAL | Status: DC
  Administered 2024-01-04 – 2024-01-05 (×2): 2.5 mg via ORAL
  Filled 2024-01-04 (×2): qty 1

## 2024-01-04 MED ORDER — LATANOPROST 0.005 % OP SOLN
1.0000 [drp] | Freq: Every day | OPHTHALMIC | Status: DC
  Administered 2024-01-04: 1 [drp] via OPHTHALMIC
  Filled 2024-01-04 (×2): qty 2.5

## 2024-01-04 MED ORDER — MIRTAZAPINE 15 MG PO TABS
15.0000 mg | ORAL_TABLET | Freq: Every day | ORAL | Status: DC
Start: 2024-01-04 — End: 2024-01-05
  Administered 2024-01-04: 15 mg via ORAL
  Filled 2024-01-04: qty 1

## 2024-01-04 MED ORDER — DONEPEZIL HCL 5 MG PO TABS
5.0000 mg | ORAL_TABLET | Freq: Every day | ORAL | Status: DC
  Administered 2024-01-04: 5 mg via ORAL
  Filled 2024-01-04: qty 1

## 2024-01-04 MED ORDER — MEMANTINE HCL 10 MG PO TABS
10.0000 mg | ORAL_TABLET | Freq: Two times a day (BID) | ORAL | Status: DC
  Administered 2024-01-04 – 2024-01-05 (×2): 10 mg via ORAL
  Filled 2024-01-04 (×2): qty 1

## 2024-01-04 MED ORDER — FUROSEMIDE 10 MG/ML IJ SOLN: 20.0000 mg | Freq: Two times a day (BID) | INTRAMUSCULAR | Status: DC

## 2024-01-04 MED ORDER — DILTIAZEM HCL ER COATED BEADS 120 MG PO CP24
120.0000 mg | ORAL_CAPSULE | Freq: Every day | ORAL | Status: DC
  Administered 2024-01-04 – 2024-01-05 (×2): 120 mg via ORAL
  Filled 2024-01-04 (×3): qty 1

## 2024-01-04 MED ORDER — HYDRALAZINE HCL 25 MG PO TABS
25.0000 mg | ORAL_TABLET | Freq: Three times a day (TID) | ORAL | Status: DC
  Administered 2024-01-04 – 2024-01-05 (×3): 25 mg via ORAL
  Filled 2024-01-04 (×3): qty 1

## 2024-01-04 MED ORDER — ACETAMINOPHEN 650 MG RE SUPP: 650.0000 mg | Freq: Four times a day (QID) | RECTAL | Status: DC | PRN

## 2024-01-04 MED ORDER — FLUTICASONE FUROATE-VILANTEROL 100-25 MCG/ACT IN AEPB
1.0000 | INHALATION_SPRAY | Freq: Every day | RESPIRATORY_TRACT | Status: DC
  Administered 2024-01-05: 1 via RESPIRATORY_TRACT
  Filled 2024-01-04 (×2): qty 28

## 2024-01-04 MED ORDER — ONDANSETRON HCL 4 MG/2ML IJ SOLN: 4.0000 mg | Freq: Four times a day (QID) | INTRAMUSCULAR | Status: DC | PRN

## 2024-01-04 MED ORDER — IOHEXOL 350 MG/ML SOLN
75.0000 mL | Freq: Once | INTRAVENOUS | Status: AC | PRN
  Administered 2024-01-04: 75 mL via INTRAVENOUS

## 2024-01-04 NOTE — Care Management Obs Status (Signed)
 MEDICARE OBSERVATION STATUS NOTIFICATION   Patient Details  Name: Amber Barnett MRN: [id number] Date of Birth: 05/18/1934   Medicare Observation Status Notification Given:  Yes  Obs/[place] Letter done Telephonically will mail copy to the patient representative [place]gr[place]ia Jasmine Mesi 01/04/2024, 4:31 PM

## 2024-01-04 NOTE — ED Notes (Signed)
 Pt taken to CT.

## 2024-01-04 NOTE — ED Notes (Signed)
 Pt ambulated to the bathroom on a pulse ox. Sats dropped to about 89-90% after a few steps.

## 2024-01-04 NOTE — ED Notes (Signed)
 Niece updated by this RN

## 2024-01-04 NOTE — Plan of Care (Signed)
   Problem: Education: Goal: Knowledge of General Education information will improve Description: Including pain rating scale, medication(s)/side effects and non-pharmacologic comfort measures Outcome: Progressing   Problem: Clinical Measurements: Goal: Will remain free from infection Outcome: Progressing   Problem: Clinical Measurements: Goal: Diagnostic test results will improve Outcome: Progressing   Problem: Clinical Measurements: Goal: Respiratory complications will improve Outcome: Progressing   Problem: Clinical Measurements: Goal: Cardiovascular complication will be avoided Outcome: Progressing   Problem: Activity: Goal: Risk for activity intolerance will decrease Outcome: Progressing

## 2024-01-04 NOTE — ED Provider Notes (Signed)
 [place] EMERGENCY DEPARTMENT AT [facility] Provider Note   CSN: [id number] Arrival date & time: 01/03/24  2343     History  Chief Complaint  Patient presents with   Hypertension    Amber Barnett is a 89 y.o. female.  HPI     This is an 89 year old female with history of dementia and atrial fibrillation who presents with lightheadedness and high blood pressure.  Patient reports that she has had intermittent dizziness in the last day.  She describes as lightheadedness.  She denies room spinning dizziness.  Has not had any headache.  Staff reported that her blood pressure was high.  Upon EMS arrival her O2 sats were 89%.  Denies shortness of breath.  Is not on home oxygen .  No recent fevers or cough.  Home Medications Prior to Admission medications   Medication Sig Start Date End Date Taking? Authorizing Provider  albuterol  (VENTOLIN  HFA) 108 (90 Base) MCG/ACT inhaler Inhale 2 puffs into the lungs 4 (four) times daily as needed for wheezing or shortness of breath. 03/11/20   [provider]  apixaban  (ELIQUIS ) 2.5 MG TABS tablet Take by mouth 2 (two) times daily.    [provider]  atorvastatin  (LIPITOR) 20 MG tablet Take 1 tablet (20 mg total) by mouth daily. 07/02/20 07/19/21  Lisabeth Rider, MD  Cholecalciferol  (VITAMIN D3) 2000 units capsule Take 2,000 Units by mouth daily.    [provider]  diltiazem  (TIAZAC ) 120 MG 24 hr capsule Take 120 mg by mouth daily. 04/26/17   [provider]  diphenhydrAMINE  (BENADRYL ) 25 MG tablet Take 25 mg by mouth as needed for itching or allergies.    [provider]  famotidine  (PEPCID ) 20 MG tablet Take 20 mg by mouth daily. Patient not taking: Reported on 09/25/2022 11/27/19   [provider]  Fe Fum-FA-B Cmp-C-Zn-Mg-Mn-Cu (HEMATINIC PLUS VIT/MINERALS) 106-1 MG TABS Take 1 tablet by mouth 2 (two) times daily. 07/26/20   [provider]  furosemide  (LASIX ) 20 MG  tablet Take 20 mg by mouth daily. MAY TAKE 2 TABLETS IF GAINS OVER 1.5 LB IN A DAY 07/01/20   [provider]  furosemide  (LASIX ) 20 MG tablet Take 2 tablets (40 mg total) by mouth daily for 4 days. 10/04/21 10/08/21  Floyd, Dan, DO  HYDROcodone -acetaminophen  (NORCO/VICODIN) 5-325 MG tablet Take 1 tablet by mouth every 6 (six) hours as needed for moderate pain. Patient not taking: Reported on 09/25/2022 07/04/22   Zackowski, Scott, MD  latanoprost  (XALATAN ) 0.005 % ophthalmic solution Place 1 drop into both eyes at bedtime. 10/16/16   [provider]  LORazepam  (ATIVAN ) 1 MG tablet Take 0.5 tablets (0.5 mg total) by mouth every 8 (eight) hours as needed for anxiety. 08/20/20   Deforest Fast, MD  MAGNESIUM -OXIDE 400 (241.3 Mg) MG tablet Take 800 mg by mouth daily.  12/20/19   [provider]  memantine  (NAMENDA ) 10 MG tablet Take 10 mg by mouth 2 (two) times daily.    [provider]  pantoprazole  (PROTONIX ) 40 MG tablet Take 40 mg by mouth daily. 04/26/17   [provider]  potassium chloride  (MICRO-K ) 10 MEQ CR capsule Take 10 mEq by mouth 2 (two) times daily. 07/21/20   [provider]  predniSONE  (DELTASONE ) 20 MG tablet 2 tabs po daily x 4 days 10/04/21   Floyd, Dan, DO  SYMBICORT 80-4.5 MCG/ACT inhaler Inhale 2 puffs into the lungs 2 (two) times daily. 04/16/20   [provider]  Allergies    Fish-derived products, Milk-related compounds, Other, Wild lettuce extract (lactuca virosa), and Amoxicillin    Review of Systems   Review of Systems  Constitutional:  Negative for fever.  Respiratory:  Negative for cough and shortness of breath.   Cardiovascular:  Negative for chest pain.  Gastrointestinal:  Negative for abdominal pain, nausea and vomiting.  Neurological:  Positive for light-headedness.  All other systems reviewed and are negative.   Physical Exam Updated Vital Signs BP (!) 179/86   Pulse (!) 27   Temp 97.8 F (36.6  C)   Resp (!) 27   Ht 1.676 m (5\' 6" )   Wt 59 kg   SpO2 99%   BMI 20.98 kg/m  Physical Exam Vitals and nursing note reviewed.  Constitutional:      Appearance: She is well-developed. She is not ill-appearing.     Comments: Elderly, non-ill-appearing  HENT:     Head: Normocephalic and atraumatic.     Mouth/Throat:     Mouth: Mucous membranes are moist.  Eyes:     Pupils: Pupils are equal, round, and reactive to light.  Cardiovascular:     Rate and Rhythm: Regular rhythm. Tachycardia present.     Heart sounds: Murmur heard.  Pulmonary:     Effort: Pulmonary effort is normal. No respiratory distress.     Breath sounds: No wheezing.     Comments: Mild tachypnea Abdominal:     Palpations: Abdomen is soft.  Musculoskeletal:     Cervical back: Neck supple.  Skin:    General: Skin is warm and dry.  Neurological:     Mental Status: She is alert.     Comments: Oriented to self, cranial nerves II through XII intact, no dysmetria to finger-nose-finger  Psychiatric:        Mood and Affect: Mood normal.     ED Results / Procedures / Treatments   Labs (all labs ordered are listed, but only abnormal results are displayed) Labs Reviewed  CBC WITH DIFFERENTIAL/PLATELET - Abnormal; Notable for the following components:      Result Value   RBC 3.74 (*)    Hemoglobin 11.1 (*)    HCT 33.6 (*)    RDW 22.1 (*)    All other components within normal limits  BASIC METABOLIC PANEL WITH GFR - Abnormal; Notable for the following components:   CO2 19 (*)    Glucose, Bld 105 (*)    Calcium  8.6 (*)    All other components within normal limits  D-DIMER, QUANTITATIVE - Abnormal; Notable for the following components:   D-Dimer, Quant 1.27 (*)    All other components within normal limits  URINALYSIS, ROUTINE W REFLEX MICROSCOPIC - Abnormal; Notable for the following components:   Protein, ur >=300 (*)    Bacteria, UA RARE (*)    All other components within normal limits  BRAIN NATRIURETIC  PEPTIDE    EKG EKG Interpretation Date/Time:  Thursday Jan 03 2024 23:47:06 EDT Ventricular Rate:  116 PR Interval:    QRS Duration:  104 QT Interval:  361 QTC Calculation: 502 R Axis:   269  Text Interpretation: Atrial fibrillation Left anterior fascicular block Consider anterior infarct Prolonged QT interval Confirmed by Donita Furrow ([id number]) on 01/04/2024 12:18:46 AM  Radiology CT Angio Chest PE W and/or Wo Contrast Result Date: 01/04/2024 CLINICAL DATA:  Pulmonary embolism (PE) suspected, low to intermediate prob, positive D-dimer. Hypoxia. EXAM: CT ANGIOGRAPHY CHEST WITH CONTRAST TECHNIQUE: Multidetector CT imaging of the chest  was performed using the standard protocol during bolus administration of intravenous contrast. Multiplanar CT image reconstructions and MIPs were obtained to evaluate the vascular anatomy. RADIATION DOSE REDUCTION: This exam was performed according to the departmental dose-optimization program which includes automated exposure control, adjustment of the mA and/or kV according to patient size and/or use of iterative reconstruction technique. CONTRAST:  75mL OMNIPAQUE  IOHEXOL  350 MG/ML SOLN COMPARISON:  Chest x-ray today. FINDINGS: Cardiovascular: No filling defects in the pulmonary arteries to suggest pulmonary emboli. Cardiomegaly. Coronary artery and aortic atherosclerosis. Aorta normal caliber. Mediastinum/Nodes: No mediastinal, hilar, or axillary adenopathy. Trachea and esophagus are unremarkable. Thyroid unremarkable. Lungs/Pleura: Moderate right pleural effusion. Air-filled thin walled cavity/cystic area in the right lower lobe measures 3.5 cm. Minimal dependent/compressive atelectasis in the right lower lobe. Minimal left base atelectasis. Upper Abdomen: No acute findings Musculoskeletal: Heart and mediastinal contours are within normal limits. No focal opacities or effusions. No acute bony abnormality. 1 no acute bony abnormality. Review of the MIP images  confirms the above findings. IMPRESSION: No evidence of pulmonary embolus. Cardiomegaly, coronary artery disease. Moderate right pleural effusion with compressive atelectasis. Left base atelectasis. No convincing evidence for edema/heart failure. Aortic Atherosclerosis (ICD10-I70.0). Electronically Signed   By: Janeece Mechanic M.D.   On: 01/04/2024 01:57   DG Chest Portable 1 View Result Date: 01/04/2024 CLINICAL DATA:  Hypoxia EXAM: PORTABLE CHEST 1 VIEW COMPARISON:  05/28/2023, 11/28/2022 FINDINGS: Cardiomegaly with aortic atherosclerosis. Possible small right pleural effusion. Mild diffuse interstitial opacity could reflect minimal edema. No pneumothorax IMPRESSION: Cardiomegaly with possible small right pleural effusion and minimal interstitial edema. Electronically Signed   By: Esmeralda Hedge M.D.   On: 01/04/2024 00:25    Procedures .Critical Care  Performed by: Rory Collard, MD Authorized by: Rory Collard, MD   Critical care provider statement:    Critical care time (minutes):  35   Critical care was necessary to treat or prevent imminent or life-threatening deterioration of the following conditions: Hypoxia.   Critical care was time spent personally by me on the following activities:  Development of treatment plan with patient or surrogate, discussions with consultants, evaluation of patient's response to treatment, examination of patient, ordering and review of laboratory studies, ordering and review of radiographic studies, ordering and performing treatments and interventions, pulse oximetry, re-evaluation of patient's condition and review of old charts     Medications Ordered in ED Medications  iohexol  (OMNIPAQUE ) 350 MG/ML injection 75 mL (75 mLs Intravenous Contrast Given 01/04/24 0145)  furosemide  (LASIX ) injection 20 mg (20 mg Intravenous Given 01/04/24 0237)  hydrALAZINE  (APRESOLINE ) injection 5 mg (5 mg Intravenous Given 01/04/24 0237)    ED Course/ Medical Decision  Making/ A&P                                 Medical Decision Making Amount and/or Complexity of Data Reviewed Labs: ordered. Radiology: ordered.  Risk Prescription drug management. Decision regarding hospitalization.   This patient presents to the ED for concern of dizziness, hypoxia, this involves an extensive number of treatment options, and is a complaint that carries with it a high risk of complications and morbidity.  I considered the following differential and admission for this acute, potentially life threatening condition.  The differential diagnosis includes dehydration, arrhythmia, less likely ACS, PE, pneumonia, pneumothorax  MDM:    This is an 89 year old female who presents with complaints of dizziness.  Noted to be  hypertensive and hypoxic when vital signs were taken at her facility.  She is only complaining of dizziness.  She is nonfocal and without any dysmetria or central findings.  Most notably on physical exam, she does appear mildly tachypneic with some diminished breath sounds in the lower lobes.  She is not on home oxygen .  Labs obtained including CBC, BMP, D-dimer, urinalysis.  Chest x-ray shows possible pulmonary edema.  Cardiomegaly.  D-dimer positive.  CT angio obtained.  No evidence of pulmonary embolism.  She does have some cardiomegaly and a pleural effusion.  She is supposed be on Lasix  at home.  She was given 80 of IV Lasix  and IV hydralazine  for persistent hypertension.  It appears she only takes Cardizem  for high blood pressure.  Initially she was in atrial fibrillation with RVR.  However rate improved.  Ambulated patient and she dropped her pulse ox to 89% and was quite winded.  Will plan for admission to the hospital.  May need some diuresis and optimization of blood pressure.  (Labs, imaging, consults)  Labs: I Ordered, and personally interpreted labs.  The pertinent results include: CBC, BMP, urinalysis, D-dimer  Imaging Studies ordered: I ordered  imaging studies including chest x-ray, CTA I independently visualized and interpreted imaging. I agree with the radiologist interpretation  Additional history obtained from EMS.  External records from outside source obtained and reviewed including prior evaluations  Cardiac Monitoring: The patient was maintained on a cardiac monitor.  If on the cardiac monitor, I personally viewed and interpreted the cardiac monitored which showed an underlying rhythm of: Atrial fibrillation  Reevaluation: After the interventions noted above, I reevaluated the patient and found that they have :stayed the same  Social Determinants of Health:  lives at a facility  Disposition: Admit  Co morbidities that complicate the patient evaluation  Past Medical History:  Diagnosis Date   Atrial fibrillation (HCC)    DVT (deep venous thrombosis) (HCC)    Dysrhythmia    History of left breast cancer    Hypertension    Stroke (HCC)      Medicines Meds ordered this encounter  Medications   iohexol  (OMNIPAQUE ) 350 MG/ML injection 75 mL   furosemide  (LASIX ) injection 20 mg   hydrALAZINE  (APRESOLINE ) injection 5 mg    I have reviewed the patients home medicines and have made adjustments as needed  Problem List / ED Course: Problem List Items Addressed This Visit   None Visit Diagnoses       Near syncope    -  Primary   Relevant Medications   furosemide  (LASIX ) injection 20 mg (Completed)   hydrALAZINE  (APRESOLINE ) injection 5 mg (Completed)     Hypoxia         Hypertension, unspecified type       Relevant Medications   furosemide  (LASIX ) injection 20 mg (Completed)   hydrALAZINE  (APRESOLINE ) injection 5 mg (Completed)                   Final Clinical Impression(s) / ED Diagnoses Final diagnoses:  Near syncope  Hypoxia  Hypertension, unspecified type    Rx / DC Orders ED Discharge Orders     None         Carylon Claude, Vonzella Guernsey, MD 01/04/24 0330

## 2024-01-04 NOTE — Progress Notes (Signed)
 Heart Failure Navigator Progress Note  Assessed for Heart & Vascular TOC clinic readiness.  Patient does not meet criteria due to per MD note patient with history of Dementia. .   Navigator will sign off at this time.   Rhae Hammock, BSN, Scientist, clinical (histocompatibility and immunogenetics) Only

## 2024-01-04 NOTE — H&P (Signed)
 History and Physical  Patient: Amber Barnett ZDG:[id number] DOB: [birth year] DOA: 01/03/2024 DOS: the patient was seen and examined on 01/04/2024 Patient coming from: Home  Chief Complaint: High blood pressure.  HPI: Patient with PMH of HTN, CVA, permanent A-fib, chronic HFpEF, GERD, COPD presented to the hospital with complaints of shortness of breath. Patient was at her facility in [place].  On a routine check her blood pressure was found elevated.  She did not report to me that she had any dizziness prior to arrival but reportedly per ED provider she had intermittent dizziness ongoing for last 1 day.  Patient scribes her dizziness as lightheadedness without any vertigo. Dizziness occurs mostly on exertion and ambulation. Currently denies having any complaints of headache, dizziness, lightheadedness, blurry vision, focal deficit, numbness. No fall no trauma no injury reported by the patient. She reports that her blood pressure generally has been well-controlled.  She takes all her medications regularly.  There is no recent change in medications reported by the patient. Does not use oxygen  at her baseline. Denies any recent hospitalization travel regularly as well. She has not missed any of her blood thinner dose as well.  Assessment and Plan: Hypertensive urgency. Blood pressure was significantly elevated at the time of presentation.  Systolic was more than 200 and diastolic was more than 100.  Heart rate was also elevated. Along with that patient was also hypoxic saturating 88 to 89% on room air specifically on exertion. Reported dizziness on ambulation in the ED. Started on home medication and additional medication with improvement in blood pressure. Please continue with current regimen and monitor response.  Dizziness. Currently no focal deficit. Appears to be intermittent. Most aggressive with hypoxia. If persistent or reoccurs will perform further workup.  Persistent  A-fib. Continue Cardizem . Monitor on telemetry. Currently rate control is reasonable. On anticoagulation with Eliquis  which I will continue.  Acute on chronic HFpEF. Echocardiogram shows preserved EF. Also shows pulmonary hypertension with tricuspid regurgitation. Received IV diuresis.  Will monitor response.  Right pleural effusion Pulmonary bullae CT scan shows evidence of moderate right pleural effusion. There is also evidence of air-fluid level thin-walled cavitary lesion in the vicinity of it. Making her a poor candidate for thoracentesis as the risk of pneumothorax will be high. Treat with diuresis.  History of COPD. No exacerbation. Continue inhalers.  GERD. Continuing Pepcid  and PPI per home regimen  History of cognitive deficit. Namenda .  HLD. Continue statin.  Mood disorder. On Remeron at home. Continue.   Advance Care Planning:   Code Status: Do not attempt resuscitation (DNR) PRE-ARREST INTERVENTIONS DESIRED discussed in detail.  In the past patient has been partial code with no CPR.  She wants to continue to be on a ventilator in case if she needs to be on. Consults: None  Prior to Admission medications   Medication Sig Start Date End Date Taking? Authorizing Provider  acetaminophen  (TYLENOL ) 325 MG tablet Take 650 mg by mouth every 8 (eight) hours as needed for mild pain (pain score 1-3) or headache. 01/13/20  Yes [provider]  albuterol  (VENTOLIN  HFA) 108 (90 Base) MCG/ACT inhaler Inhale 2 puffs into the lungs 4 (four) times daily as needed for wheezing or shortness of breath. 03/11/20  Yes [provider]  apixaban  (ELIQUIS ) 2.5 MG TABS tablet Take by mouth 2 (two) times daily.   Yes [provider]  diltiazem  (TIAZAC ) 120 MG 24 hr capsule Take 120 mg by mouth daily. 04/26/17  Yes [provider]  donepezil (ARICEPT) 5 MG tablet Take 5 mg by mouth at bedtime. 12/20/23  Yes [provider]  famotidine  (PEPCID ) 20 MG  tablet Take 20 mg by mouth at bedtime. 11/27/19  Yes [provider]  fluticasone -salmeterol (ADVAIR) 100-50 MCG/ACT AEPB Inhale 1 puff into the lungs 2 (two) times daily. 12/06/23  Yes [provider]  latanoprost  (XALATAN ) 0.005 % ophthalmic solution Place 1 drop into both eyes at bedtime. 10/16/16  Yes [provider]  MAGNESIUM -OXIDE 400 (241.3 Mg) MG tablet Take 400 mg by mouth 2 (two) times daily. 12/20/19  Yes [provider]  memantine  (NAMENDA ) 10 MG tablet Take 10 mg by mouth 2 (two) times daily.   Yes [provider]  mirtazapine (REMERON) 15 MG tablet Take 15 mg by mouth at bedtime. 12/17/23  Yes [provider]  pantoprazole  (PROTONIX ) 40 MG tablet Take 40 mg by mouth daily. 04/26/17  Yes [provider]  rosuvastatin (CRESTOR) 5 MG tablet Take 5 mg by mouth at bedtime. 12/17/23  Yes [provider]    Past Medical History:  Diagnosis Date   Atrial fibrillation (HCC)    DVT (deep venous thrombosis) (HCC)    Dysrhythmia    History of left breast cancer    Hypertension    Stroke [facility])    Past Surgical History:  Procedure Laterality Date   COLONOSCOPY WITH PROPOFOL  N/A 12/29/2019   Procedure: COLONOSCOPY WITH PROPOFOL ;  Surgeon: Evangeline Hilts, MD;  Location: [facility] ENDOSCOPY;  Service: Endoscopy;  Laterality: N/A;   IR ANGIO INTRA EXTRACRAN SEL COM CAROTID INNOMINATE UNI R MOD SED  05/18/2017   IR ANGIO VERTEBRAL SEL SUBCLAVIAN INNOMINATE UNI R MOD SED  05/18/2017   IR PERCUTANEOUS ART THROMBECTOMY/INFUSION INTRACRANIAL INC DIAG ANGIO  05/18/2017   IR RADIOLOGIST EVAL & MGMT  07/19/2017   RADIOLOGY WITH ANESTHESIA N/A 05/18/2017   Procedure: RADIOLOGY WITH ANESTHESIA;  Surgeon: Luellen Sages, MD;  Location: [facility] OR;  Service: Radiology;  Laterality: N/A;   Social History:  reports that she has quit smoking. Her smoking use included cigarettes. She has never used smokeless tobacco. She reports that she does not drink  alcohol and does not use drugs. Allergies  Allergen Reactions   Fish-Derived Products Swelling    Facial swelling then tongue swelling   Milk-Related Compounds Diarrhea    WHOLE MILK   Other Swelling and Diarrhea    Allergy to all nuts - facial swelling, then tongue swelling  WHOLE MILK   Wild Lettuce Extract (Lactuca Virosa) Diarrhea   Amoxicillin Nausea And Vomiting   Family History  Problem Relation Age of Onset   Stroke Son    Physical Exam: Vitals:   01/04/24 0900 01/04/24 1000 01/04/24 1111 01/04/24 1503  BP: (!) 151/97 (!) 161/76 (!) 149/91 (!) 162/75  Pulse: 89 84 78   Resp: 19 18 17 18   Temp:    98.1 F (36.7 C)  TempSrc:    Oral  SpO2: 100% 100% 100% 93%  Weight:    49.9 kg  Height:    5\' 6"  (1.676 m)   General: Appear in mild distress; no visible Abnormal Neck Mass Or lumps, Conjunctiva normal Cardiovascular: S1 and S2 Present, no Murmur, Respiratory: good respiratory effort, Bilateral Air entry present and bilateral basal Crackles, no wheezes Abdomen: Bowel Sound present, Non tender  Extremities: no Pedal edema Neurology: alert and oriented to time, place, and person Gait not checked due to patient safety concerns   Data Reviewed: I have reviewed  ED notes, Vitals, Lab results and outpatient records. Since last encounter, pertinent lab results CBC and BMP   . I have ordered test including CBC and BMP  . I have ordered imaging echocardiogram  . I have independently visualized and interpreted imaging CT chest which showed unilateral pleural effusion in the vicinity of bulla  .   Family Communication: No one at bedside unilateral pleural effusion. Right-sided pleural effusion.   Author: Charlean Congress, MD 01/04/2024 5:28 PM For on call review [email or web address].

## 2024-01-04 NOTE — Hospital Course (Addendum)
 Patient with PMH of HTN, CVA, permanent A-fib, chronic HFpEF, GERD, COPD presented to the hospital with complaints of shortness of breath. Patient was at her facility in [place].  On a routine check her blood pressure was found elevated.  She did not report to me that she had any dizziness prior to arrival but reportedly per ED provider she had intermittent dizziness ongoing for last 1 day.  Patient scribes her dizziness as lightheadedness without any vertigo. Dizziness occurs mostly on exertion and ambulation. Currently denies having any complaints of headache, dizziness, lightheadedness, blurry vision, focal deficit, numbness. No fall no trauma no injury reported by the patient. She reports that her blood pressure generally has been well-controlled.  She takes all her medications regularly.  There is no recent change in medications reported by the patient. Does not use oxygen  at her baseline. Denies any recent hospitalization travel regularly as well. She has not missed any of her blood thinner dose as well.  Assessment and Plan: Hypertensive urgency. Blood pressure was significantly elevated at the time of presentation.  Systolic was more than 200 and diastolic was more than 100.  Heart rate was also elevated. Along with that patient was also hypoxic saturating 88 to 89% on room air specifically on exertion. Reported dizziness on ambulation in the ED. Started on home medication and additional medication with improvement in blood pressure. Please continue with current regimen and monitor response.  Dizziness. Currently no focal deficit. Appears to be intermittent. Most aggressive with hypoxia. If persistent or reoccurs will perform further workup.  Persistent A-fib. Continue Cardizem . Monitor on telemetry. Currently rate control is reasonable. On anticoagulation with Eliquis  which I will continue.  Acute on chronic HFpEF. Echocardiogram shows preserved EF. Also shows pulmonary  hypertension with tricuspid regurgitation. Received IV diuresis.  Will monitor response.  Right pleural effusion Pulmonary bullae CT scan shows evidence of moderate right pleural effusion. There is also evidence of air-fluid level thin-walled cavitary lesion in the vicinity of it. Making her a poor candidate for thoracentesis as the risk of pneumothorax will be high. Treat with diuresis.  History of COPD. No exacerbation. Continue inhalers.  GERD. Continuing Pepcid  and PPI per home regimen  History of cognitive deficit. Namenda .  HLD. Continue statin.  Mood disorder. On Remeron at home. Continue.

## 2024-01-05 DIAGNOSIS — I160 Hypertensive urgency: Secondary | ICD-10-CM | POA: Diagnosis not present

## 2024-01-05 LAB — CBC
HCT: 32.3 % — ABNORMAL LOW (ref 36.0–46.0)
Hemoglobin: 11.5 g/dL — ABNORMAL LOW (ref 12.0–15.0)
MCH: 30.7 pg (ref 26.0–34.0)
MCHC: 35.6 g/dL (ref 30.0–36.0)
MCV: 86.4 fL (ref 80.0–100.0)
Platelets: 209 10*3/uL (ref 150–400)
RBC: 3.74 MIL/uL — ABNORMAL LOW (ref 3.87–5.11)
RDW: 22.7 % — ABNORMAL HIGH (ref 11.5–15.5)
WBC: 5.5 10*3/uL (ref 4.0–10.5)
nRBC: 0 % (ref 0.0–0.2)

## 2024-01-05 LAB — BASIC METABOLIC PANEL WITH GFR
Anion gap: 13 (ref 5–15)
BUN: 11 mg/dL (ref 8–23)
CO2: 23 mmol/L (ref 22–32)
Calcium: 8.5 mg/dL — ABNORMAL LOW (ref 8.9–10.3)
Chloride: 102 mmol/L (ref 98–111)
Creatinine, Ser: 1.09 mg/dL — ABNORMAL HIGH (ref 0.44–1.00)
GFR, Estimated: 49 mL/min — ABNORMAL LOW (ref >60)
Glucose, Bld: 82 mg/dL (ref 70–99)
Potassium: 3.4 mmol/L — ABNORMAL LOW (ref 3.5–5.1)
Sodium: 138 mmol/L (ref 135–145)

## 2024-01-05 MED ORDER — FUROSEMIDE 20 MG PO TABS: 20.0000 mg | ORAL_TABLET | ORAL | 0 refills | Status: DC

## 2024-01-05 MED ORDER — ISOSORBIDE MONONITRATE ER 30 MG PO TB24: 30.0000 mg | ORAL_TABLET | Freq: Every day | ORAL | 0 refills | Status: AC

## 2024-01-05 MED ORDER — FUROSEMIDE 20 MG PO TABS: 20.0000 mg | ORAL_TABLET | Freq: Every day | ORAL | 0 refills | Status: DC | PRN

## 2024-01-05 MED ORDER — POTASSIUM CHLORIDE CRYS ER 20 MEQ PO TBCR
40.0000 meq | EXTENDED_RELEASE_TABLET | Freq: Once | ORAL | Status: AC
  Administered 2024-01-05: 40 meq via ORAL
  Filled 2024-01-05: qty 2

## 2024-01-05 NOTE — Plan of Care (Signed)

## 2024-01-05 NOTE — TOC Progression Note (Addendum)
 Transition of Care [facility]) - Initial/Assessment Note    Patient Details  Name: Amber Barnett MRN: [id number] Date of Birth: 1933/10/31  Transition of Care [facility]) CM/SW Contact:    Maya Sparrow, LCSW Phone Number: 01/05/2024, 9:54 AM  Clinical Narrative:                 CSW received consult due to possible discharge and contacted [facility] and spoke with [facility].  LCSW confirmed that the pt is a resident at the facility and inquired as to which home O2 provider the facility used.  Nira Basset reports that she will need to contact supervision at the facility and will call CSW back once the information is received.  Care team updated.  10:15- CSW received a call from [place] with [facility].  The facility can accept pt back, but will need O2 delivered and O2 order faxed to [phone] prior to pt returning.  RNCM and care team informed. CSW requested that the facility call once O2 is received.    TOC will continue to follow.          Patient Goals and CMS Choice            Expected Discharge Plan and Services                                              Prior Living Arrangements/Services                       Activities of Daily Living   ADL Screening (condition at time of admission) Independently performs ADLs?: No Does the patient have a NEW difficulty with bathing/dressing/toileting/self-feeding that is expected to last >3 days?: No Does the patient have a NEW difficulty with getting in/out of bed, walking, or climbing stairs that is expected to last >3 days?: No Does the patient have a NEW difficulty with communication that is expected to last >3 days?: No Is the patient deaf or have difficulty hearing?: No Does the patient have difficulty seeing, even when wearing glasses/contacts?: No Does the patient have difficulty concentrating, remembering, or making decisions?: No  Permission Sought/Granted                  Emotional Assessment               Admission diagnosis:  Hypoxia [R09.02] Acute CHF (congestive heart failure) (HCC) [I50.9] Near syncope [R55] Hypertension, unspecified type [I10] Patient Active Problem List   Diagnosis Date Noted   Acute on chronic diastolic CHF (congestive heart failure) (HCC) 01/04/2024   Hypertensive urgency 01/04/2024   History of CVA (cerebrovascular accident) 01/04/2024   Metabolic acidosis 01/04/2024   Positive D dimer 01/04/2024   Pulmonary hypertension (HCC) 01/04/2024   CVA (cerebral vascular accident) (HCC) 08/14/2020   GIB (gastrointestinal bleeding) 08/11/2020   Rectal bleeding 12/25/2019   Current use of long term anticoagulation 05/21/2017   Cerebral embolism with cerebral infarction (HCC) 05/18/2017   Essential hypertension 05/08/2017   Permanent atrial fibrillation (HCC) 05/08/2017   PCP:  Ruperto Coup, PA-C Pharmacy:   [facility] [phone] - [place], [place] - [address] [place] [address] Phone: [phone] Fax: [phone]  [facility] #[id number] - [place], [place] - [address] AT [facility]  [address] [place] [address] Phone: [phone] Fax: [phone]     Social Drivers of Health (SDOH) Social History: SDOH Screenings   Food Insecurity: No Food Insecurity (01/04/2024)  Housing: Low Risk  (01/04/2024)  Transportation Needs: No Transportation Needs (01/04/2024)  Utilities: Not At Risk (01/04/2024)  Financial Resource Strain: Low Risk  (08/22/2022)   Received from [facility]  Social Connections: Unknown (01/04/2024)  Stress: No Stress Concern Present (06/27/2023)   Received from [facility]  Tobacco Use: Medium Risk (10/16/2023)   Received from [facility]   SDOH Interventions:     Readmission Risk Interventions     No data to display

## 2024-01-05 NOTE — Discharge Summary (Signed)
 Physician Discharge Summary   Patient: Amber Barnett MRN: [id number] DOB: [birth year]  Admit date:     01/03/2024  Discharge date: 01/05/24  Discharge Physician: Charlean Congress  PCP: Ruperto Coup, PA-C  Recommendations at discharge: Follow up with PCP in 1 week Repeat CXR in 1 month to ensure resolution of the pleural effusion.  Repeat BMP in 1 week   Follow-up Information     Bulla, Gwinda Leopard, PA-C. Schedule an appointment as soon as possible for a visit in 1 week(s).   Specialty: Internal Medicine Why: with BMP lab to look at kidney and electrolytes, with Repeat Chest X ray in 4 weeks Contact information: [address] [place] [address] [phone]                Discharge Diagnoses: Principal Problem:   Hypertensive urgency Active Problems:   Permanent atrial fibrillation (HCC)   Acute on chronic diastolic CHF (congestive heart failure) (HCC)   History of CVA (cerebrovascular accident)   Metabolic acidosis   Positive D dimer   Pulmonary hypertension [facility])  Hospital Course: Patient with PMH of HTN, CVA, permanent A-fib, chronic HFpEF, GERD, COPD presented to the hospital with complaints of shortness of breath. Patient was at her facility in [place].  On a routine check her blood pressure was found elevated.  She did not report to me that she had any dizziness prior to arrival but reportedly per ED provider she had intermittent dizziness ongoing for last 1 day.  Patient scribes her dizziness as lightheadedness without any vertigo. Dizziness occurs mostly on exertion and ambulation. Currently denies having any complaints of headache, dizziness, lightheadedness, blurry vision, focal deficit, numbness. No fall no trauma no injury reported by the patient. She reports that her blood pressure generally has been well-controlled.  She takes all her medications regularly.  There is no recent change in medications reported by the patient. Does not use oxygen  at  her baseline. Denies any recent hospitalization travel regularly as well. She has not missed any of her blood thinner dose as well.  Assessment and Plan: Hypertensive urgency. Blood pressure was significantly elevated at the time of presentation.  Systolic was more than 200 and diastolic was more than 100.  Heart rate was also elevated. Along with that patient was also hypoxic saturating 88 to 89% on room air specifically on exertion. Reported dizziness on ambulation in the ED. Started on home medication and additional medication with improvement in blood pressure. Continue home regimen plus imdur  and lasix  3 times a week  Dizziness. Currently no focal deficit. Appears to be intermittent. Currently resolved.  Able to ambulate without any symptoms.   Persistent A-fib. Continue Cardizem . Currently rate control is reasonable. On anticoagulation with Eliquis  which I will continue.  Acute on chronic HFpEF. Echocardiogram shows preserved EF. Also shows pulmonary hypertension with tricuspid regurgitation. Received IV diuresis. Switch to oral. Will be gentle due to age and pulmonary hypertension.   Right pleural effusion Pulmonary bullae CT scan shows evidence of moderate right pleural effusion. There is also evidence of air-fluid level thin-walled cavitary lesion in the vicinity of it. Making her a poor candidate for thoracentesis as the risk of pneumothorax will be high. Treat with diuresis. Repeat CXR in 1 month.   History of COPD. No exacerbation. Continue inhalers.  GERD. Continuing Pepcid  and PPI per home regimen  History of cognitive deficit. Namenda .  HLD. Continue statin.  Mood disorder. On Remeron  at home. Continue.  Consultants:  none  Procedures performed:  Echocardiogram  DISCHARGE MEDICATION: Allergies as of 01/05/2024       Reactions   Fish-derived Products Swelling   Facial swelling then tongue swelling   Milk-related Compounds Diarrhea   WHOLE  MILK   Other Swelling, Diarrhea   Allergy to all nuts - facial swelling, then tongue swelling WHOLE MILK   Wild Lettuce Extract (lactuca Virosa) Diarrhea   Amoxicillin Nausea And Vomiting        Medication List     TAKE these medications    acetaminophen  325 MG tablet Commonly known as: TYLENOL  Take 650 mg by mouth every 8 (eight) hours as needed for mild pain (pain score 1-3) or headache.   albuterol  108 (90 Base) MCG/ACT inhaler Commonly known as: VENTOLIN  HFA Inhale 2 puffs into the lungs 4 (four) times daily as needed for wheezing or shortness of breath.   apixaban  2.5 MG Tabs tablet Commonly known as: ELIQUIS  Take by mouth 2 (two) times daily.   diltiazem  120 MG 24 hr capsule Commonly known as: TIAZAC  Take 120 mg by mouth daily.   donepezil  5 MG tablet Commonly known as: ARICEPT  Take 5 mg by mouth at bedtime.   famotidine  20 MG tablet Commonly known as: PEPCID  Take 20 mg by mouth at bedtime.   fluticasone -salmeterol 100-50 MCG/ACT Aepb Commonly known as: ADVAIR Inhale 1 puff into the lungs 2 (two) times daily.   furosemide  20 MG tablet Commonly known as: Lasix  Take 1 tablet (20 mg total) by mouth every Monday, Wednesday, and Friday. Start taking on: Jan 07, 2024   isosorbide  mononitrate 30 MG 24 hr tablet Commonly known as: IMDUR  Take 1 tablet (30 mg total) by mouth daily. Start taking on: Jan 06, 2024   latanoprost  0.005 % ophthalmic solution Commonly known as: XALATAN  Place 1 drop into both eyes at bedtime.   MAGnesium -Oxide 400 (241.3 Mg) MG tablet Generic drug: magnesium  oxide Take 400 mg by mouth 2 (two) times daily.   memantine  10 MG tablet Commonly known as: NAMENDA  Take 10 mg by mouth 2 (two) times daily.   mirtazapine  15 MG tablet Commonly known as: REMERON  Take 15 mg by mouth at bedtime.   pantoprazole  40 MG tablet Commonly known as: PROTONIX  Take 40 mg by mouth daily.   rosuvastatin  5 MG tablet Commonly known as: CRESTOR  Take  5 mg by mouth at bedtime.       Disposition: ALF/ILF Diet recommendation: Cardiac diet  Discharge Exam: Vitals:   01/05/24 0505 01/05/24 0659 01/05/24 0802 01/05/24 0831  BP: 133/80 139/76  (!) 116/58  Pulse: 86     Resp: 17   18  Temp: 98.2 F (36.8 C)   98.3 F (36.8 C)  TempSrc: Oral   Oral  SpO2: 100%  98%   Weight: 49.4 kg     Height:       General: Appear in no distress; no visible Abnormal Neck Mass Or lumps, Conjunctiva normal Cardiovascular: S1 and S2 Present, no Murmur, Respiratory: good respiratory effort, Bilateral Air entry present and CTA, no Crackles, no wheezes Abdomen: Bowel Sound present, Non tender  Extremities: no Pedal edema Neurology: alert and oriented to time, place, and person  Filed Weights   01/03/24 2347 01/04/24 1503 01/05/24 0505  Weight: 59 kg 49.9 kg 49.4 kg   Condition at discharge: stable  The results of significant diagnostics from this hospitalization (including imaging, microbiology, ancillary and laboratory) are listed below for reference.   Imaging Studies: ECHOCARDIOGRAM COMPLETE Result Date: 01/04/2024  ECHOCARDIOGRAM REPORT   Patient Name:   Amber Barnett [facility] Date of Exam: 01/04/2024 Medical Rec #:  [id number]                 Height:       66.0 in Accession #:    [id number]                Weight:       130.0 lb Date of Birth:  [birth year]                BSA:          1.665 m Patient Age:    89 years                  BP:           170/145 mmHg Patient Gender: F                         HR:           81 bpm. Exam Location:  Inpatient Procedure: 2D Echo, Cardiac Doppler and Color Doppler (Both Spectral and Color            Flow Doppler were utilized during procedure). Indications:    CHF  History:        Patient has prior history of Echocardiogram examinations, most                 recent 08/15/2020. Arrythmias:Atrial Fibrillation; Risk                 Factors:Hypertension.  Sonographer:    Melissa Morford RDCS (AE, PE) Referring  Phys: [id number] Lief Palmatier M Addisynn Vassell IMPRESSIONS  1. Left ventricular ejection fraction, by estimation, is 60 to 65%. The left ventricle has normal function. The left ventricle has no regional wall motion abnormalities. Left ventricular diastolic parameters are indeterminate.  2. D-shaped interventricular septum suggestive of RV pressure/volume overload. Right ventricular systolic function is mildly reduced. The right ventricular size is mildly enlarged. Mildly increased right ventricular wall thickness. There is severely elevated pulmonary artery systolic pressure. The estimated right ventricular systolic pressure is 82.2 mmHg.  3. Left atrial size was severely dilated.  4. Right atrial size was severely dilated.  5. The mitral valve is normal in structure. Mild mitral valve regurgitation. No evidence of mitral stenosis.  6. The tricuspid valve is abnormal. Tricuspid valve regurgitation is moderate to severe.  7. The aortic valve is tricuspid. There is mild calcification of the aortic valve. Aortic valve regurgitation is mild. No aortic stenosis is present.  8. The inferior vena cava is dilated in size with <50% respiratory variability, suggesting right atrial pressure of 15 mmHg.  9. The patient was in atrial fibrillation. FINDINGS  Left Ventricle: Left ventricular ejection fraction, by estimation, is 60 to 65%. The left ventricle has normal function. The left ventricle has no regional wall motion abnormalities. The left ventricular internal cavity size was normal in size. There is  no left ventricular hypertrophy. Left ventricular diastolic parameters are indeterminate. Right Ventricle: D-shaped interventricular septum suggestive of RV pressure/volume overload. The right ventricular size is mildly enlarged. Mildly increased right ventricular wall thickness. Right ventricular systolic function is mildly reduced. There is  severely elevated pulmonary artery systolic pressure. The tricuspid regurgitant velocity is 4.10 m/s,  and with an assumed right atrial pressure of 15 mmHg, the estimated right ventricular systolic pressure is 82.2 mmHg. Left [facility]: Left atrial size  was severely dilated. Right [facility]: Right atrial size was severely dilated. Pericardium: Trivial pericardial effusion is present. Mitral Valve: The mitral valve is normal in structure. Mild mitral valve regurgitation. No evidence of mitral valve stenosis. Tricuspid Valve: The tricuspid valve is abnormal. Tricuspid valve regurgitation is moderate to severe. Aortic Valve: The aortic valve is tricuspid. There is mild calcification of the aortic valve. Aortic valve regurgitation is mild. Aortic regurgitation PHT measures 666 msec. No aortic stenosis is present. Pulmonic Valve: The pulmonic valve was normal in structure. Pulmonic valve regurgitation is mild. Aorta: The aortic root is normal in size and structure. Venous: The inferior vena cava is dilated in size with less than 50% respiratory variability, suggesting right atrial pressure of 15 mmHg. IAS/Shunts: No atrial level shunt detected by color flow Doppler.  LEFT VENTRICLE PLAX 2D LVIDd:         3.50 cm   Diastology LVIDs:         2.30 cm   LV e' lateral:   9.36 cm/s LV PW:         1.10 cm   LV E/e' lateral: 8.9 LV IVS:        1.10 cm LVOT diam:     1.90 cm LV SV:         36 LV SV Index:   22 LVOT Area:     2.84 cm  RIGHT VENTRICLE RV S prime:     8.38 cm/s TAPSE (M-mode): 1.4 cm LEFT ATRIUM              Index        RIGHT ATRIUM           Index LA diam:        4.60 cm  2.76 cm/m   RA Area:     51.10 cm LA Vol (A2C):   130.0 ml 78.07 ml/m  RA Volume:   213.00 ml 127.91 ml/m LA Vol (A4C):   84.5 ml  50.74 ml/m LA Biplane Vol: 109.0 ml 65.46 ml/m  AORTIC VALVE LVOT Vmax:   70.30 cm/s LVOT Vmean:  52.100 cm/s LVOT VTI:    0.128 m AI PHT:      666 msec  AORTA Ao Root diam: 3.10 cm Ao Asc diam:  2.60 cm MITRAL VALVE               TRICUSPID VALVE MV Area (PHT): 4.17 cm    TR Peak grad:   67.2 mmHg MV Decel Time: 182  msec    TR Vmax:        410.00 cm/s MV E velocity: 83.30 cm/s                            SHUNTS                            Systemic VTI:  0.13 m                            Systemic Diam: 1.90 cm Dalton McleanMD Electronically signed by Archer Bear Signature Date/Time: 01/04/2024/12:28:05 PM    Final    CT Angio Chest PE W and/or Wo Contrast Result Date: 01/04/2024 CLINICAL DATA:  Pulmonary embolism (PE) suspected, low to intermediate prob, positive D-dimer. Hypoxia. EXAM: CT ANGIOGRAPHY CHEST WITH CONTRAST TECHNIQUE: Multidetector CT imaging of the chest was performed using  the standard protocol during bolus administration of intravenous contrast. Multiplanar CT image reconstructions and MIPs were obtained to evaluate the vascular anatomy. RADIATION DOSE REDUCTION: This exam was performed according to the departmental dose-optimization program which includes automated exposure control, adjustment of the mA and/or kV according to patient size and/or use of iterative reconstruction technique. CONTRAST:  75mL OMNIPAQUE  IOHEXOL  350 MG/ML SOLN COMPARISON:  Chest x-ray today. FINDINGS: Cardiovascular: No filling defects in the pulmonary arteries to suggest pulmonary emboli. Cardiomegaly. Coronary artery and aortic atherosclerosis. Aorta normal caliber. Mediastinum/Nodes: No mediastinal, hilar, or axillary adenopathy. Trachea and esophagus are unremarkable. Thyroid unremarkable. Lungs/Pleura: Moderate right pleural effusion. Air-filled thin walled cavity/cystic area in the right lower lobe measures 3.5 cm. Minimal dependent/compressive atelectasis in the right lower lobe. Minimal left base atelectasis. Upper Abdomen: No acute findings Musculoskeletal: Heart and mediastinal contours are within normal limits. No focal opacities or effusions. No acute bony abnormality. 1 no acute bony abnormality. Review of the MIP images confirms the above findings. IMPRESSION: No evidence of pulmonary embolus. Cardiomegaly, coronary  artery disease. Moderate right pleural effusion with compressive atelectasis. Left base atelectasis. No convincing evidence for edema/heart failure. Aortic Atherosclerosis (ICD10-I70.0). Electronically Signed   By: Janeece Mechanic M.D.   On: 01/04/2024 01:57   DG Chest Portable 1 View Result Date: 01/04/2024 CLINICAL DATA:  Hypoxia EXAM: PORTABLE CHEST 1 VIEW COMPARISON:  05/28/2023, 11/28/2022 FINDINGS: Cardiomegaly with aortic atherosclerosis. Possible small right pleural effusion. Mild diffuse interstitial opacity could reflect minimal edema. No pneumothorax IMPRESSION: Cardiomegaly with possible small right pleural effusion and minimal interstitial edema. Electronically Signed   By: Esmeralda Hedge M.D.   On: 01/04/2024 00:25    Microbiology: Results for orders placed or performed during the hospital encounter of 11/28/22  Resp panel by RT-PCR (RSV, Flu A&B, Covid) Anterior Nasal Swab     Status: None   Collection Time: 11/28/22  4:19 PM   Specimen: Anterior Nasal Swab  Result Value Ref Range Status   SARS Coronavirus 2 by RT PCR NEGATIVE NEGATIVE Final   Influenza A by PCR NEGATIVE NEGATIVE Final   Influenza B by PCR NEGATIVE NEGATIVE Final    Comment: (NOTE) The Xpert Xpress SARS-CoV-2/FLU/RSV plus assay is intended as an aid in the diagnosis of influenza from Nasopharyngeal swab specimens and should not be used as a sole basis for treatment. Nasal washings and aspirates are unacceptable for Xpert Xpress SARS-CoV-2/FLU/RSV testing.  Fact Sheet for Patients: BloggerCourse.com  Fact Sheet for Healthcare Providers: SeriousBroker.it  This test is not yet approved or cleared by the [place]  FDA and has been authorized for detection and/or diagnosis of SARS-CoV-2 by FDA under an Emergency Use Authorization (EUA). This EUA will remain in effect (meaning this test can be used) for the duration of the COVID-19 declaration under Section  564(b)(1) of the Act, 21 U.S.C. section 360bbb-3(b)(1), unless the authorization is terminated or revoked.     Resp Syncytial Virus by PCR NEGATIVE NEGATIVE Final    Comment: (NOTE) Fact Sheet for Patients: BloggerCourse.com  Fact Sheet for Healthcare Providers: SeriousBroker.it  This test is not yet approved or cleared by the [place]  FDA and has been authorized for detection and/or diagnosis of SARS-CoV-2 by FDA under an Emergency Use Authorization (EUA). This EUA will remain in effect (meaning this test can be used) for the duration of the COVID-19 declaration under Section 564(b)(1) of the Act, 21 U.S.C. section 360bbb-3(b)(1), unless the authorization is terminated or revoked.  Performed at [facility]  Lab, [address], [place], [place] [address]    Labs: CBC: Recent Labs  Lab 01/04/24 0030 01/05/24 0231  WBC 7.0 5.5  NEUTROABS 4.9  --   HGB 11.1* 11.5*  HCT 33.6* 32.3*  MCV 89.8 86.4  PLT 179 209   Basic Metabolic Panel: Recent Labs  Lab 01/04/24 0030 01/05/24 0231  NA 135 138  K 4.0 3.4*  CL 105 102  CO2 19* 23  GLUCOSE 105* 82  BUN 11 11  CREATININE 0.91 1.09*  CALCIUM  8.6* 8.5*   Liver Function Tests: No results for input(s): "AST", "ALT", "ALKPHOS", "BILITOT", "PROT", "ALBUMIN" in the last 168 hours. CBG: No results for input(s): "GLUCAP" in the last 168 hours.  Discharge time spent: greater than 30 minutes.  Author: Charlean Congress, MD  [facility]

## 2024-01-05 NOTE — Progress Notes (Signed)
 SATURATION QUALIFICATIONS: (This note is used to comply with regulatory documentation for home oxygen )  Patient Saturations on Room Air at Rest = 100%  Patient Saturations on Room Air while Ambulating = 94%  Patient Saturations on 0 Liters of oxygen  while Ambulating = 94%  Please briefly explain why patient needs home oxygen : patient did not need oxygen  ambulated on room air. Araceli Beady, RN

## 2024-01-05 NOTE — Plan of Care (Signed)

## 2024-01-05 NOTE — TOC Transition Note (Signed)
 Transition of Care [facility]) - Discharge Note   Patient Details  Name: Amber Barnett MRN: [id number] Date of Birth: 04-Dec-1933  [facility] CM/SW Contact:  Maya Sparrow, LCSW Phone Number: 01/05/2024, 11:30 AM   Clinical Narrative:    Patient will DC to: [facility] Anticipated DC date: 5/17/225 Family notified:  Yes, niece Transport by: family  Per MD patient ready for DC to ALF. RN to call report prior to discharge [phone] room 49). RN, patient's family, and facility notified of DC. Discharge Summary and FL2 faxed to facility. Family will transport.  CSW will sign off for now as social work intervention is no longer needed. Please consult us  again if new needs arise.    Final next level of care: Assisted Living Barriers to Discharge: No Barriers Identified   Patient Goals and CMS Choice            Discharge Placement              Patient chooses bed at:  ([facility]) Patient to be transferred to facility by: Family Name of family member notified: Elmyra Haggard (Niece)  [phone] Patient and family notified of of transfer: 01/05/24  Discharge Plan and Services Additional resources added to the After Visit Summary for                                       Social Drivers of Health (SDOH) Interventions SDOH Screenings   Food Insecurity: No Food Insecurity (01/04/2024)  Housing: Low Risk  (01/04/2024)  Transportation Needs: No Transportation Needs (01/04/2024)  Utilities: Not At Risk (01/04/2024)  Financial Resource Strain: Low Risk  (08/22/2022)   Received from [facility]  Social Connections: Unknown (01/04/2024)  Stress: No Stress Concern Present (06/27/2023)   Received from [facility]  Tobacco Use: Medium Risk (10/16/2023)   Received from [facility]     Readmission Risk Interventions     No data to display

## 2024-01-05 NOTE — Progress Notes (Signed)
 AVS completed. Unable to print due to legal guardian documented. Spoke to primary nurse about legal guardian status, informed that patient does not have legal guardian. Primary nurse notified that AVS is completed and to contact discharge if needed.

## 2024-01-05 NOTE — Progress Notes (Signed)
 Patient report given to nurse Deandra at [facility]. Araceli Beady, RN

## 2024-01-05 NOTE — [place] FL2 (Signed)
 [place]  MEDICAID FL2 LEVEL OF CARE FORM     IDENTIFICATION  Patient Name: Amber Barnett Birthdate: [birth year] Sex: female Admission Date (Current Location): 01/03/2024  [facility] and [place] Number:  Producer, television/film/video and Address:  [facility] [place], [address], [place], [place] [address]      Provider Number: [id number]  Attending Physician Name and Address:  Kraig Peru, MD  Relative Name and Phone Number:  Elmyra Haggard (Niece)  [phone]    Current Level of Care: Hospital Recommended Level of Care: Assisted Living Facility Prior Approval Number:    Date Approved/Denied:   PASRR Number: [id number]  Discharge Plan: Other (Comment) (ALF)    Current Diagnoses: Patient Active Problem List   Diagnosis Date Noted   Acute on chronic diastolic CHF (congestive heart failure) (HCC) 01/04/2024   Hypertensive urgency 01/04/2024   History of CVA (cerebrovascular accident) 01/04/2024   Metabolic acidosis 01/04/2024   Positive D dimer 01/04/2024   Pulmonary hypertension (HCC) 01/04/2024   CVA (cerebral vascular accident) (HCC) 08/14/2020   GIB (gastrointestinal bleeding) 08/11/2020   Rectal bleeding 12/25/2019   Current use of long term anticoagulation 05/21/2017   Cerebral embolism with cerebral infarction (HCC) 05/18/2017   Essential hypertension 05/08/2017   Permanent atrial fibrillation (HCC) 05/08/2017    Orientation RESPIRATION BLADDER Height & Weight     Time, Self  Normal Continent Weight: 108 lb 14.5 oz (49.4 kg) Height:  5\' 6"  (167.6 cm)  BEHAVIORAL SYMPTOMS/MOOD NEUROLOGICAL BOWEL NUTRITION STATUS      Continent Diet (Low Sodium Heart Healthy)  AMBULATORY STATUS COMMUNICATION OF NEEDS Skin   Supervision Verbally Normal                       Personal Care Assistance Level of Assistance  Bathing, Feeding, Dressing Bathing Assistance: Independent Feeding assistance: Independent Dressing Assistance:  Independent     Functional Limitations Info  Sight, Hearing, Speech Sight Info: Impaired   Speech Info: Adequate    SPECIAL CARE FACTORS FREQUENCY                       Contractures Contractures Info: Not present    Additional Factors Info  Code Status, Allergies Code Status Info: Do not attempt resuscitation (DNR) PRE-ARREST INTERVENTIONS DESIRED Allergies Info: Fish-derived Products  Milk-related Compounds  Other  Wild Lettuce Extract (Lactuca Virosa)  Amoxicillin           Current Medications (01/05/2024):  This is the current hospital active medication list Current Facility-Administered Medications  Medication Dose Route Frequency Provider Last Rate Last Admin   acetaminophen  (TYLENOL ) tablet 650 mg  650 mg Oral Q6H PRN Patel, Pranav M, MD       Or   acetaminophen  (TYLENOL ) suppository 650 mg  650 mg Rectal Q6H PRN Patel, Pranav M, MD       apixaban  (ELIQUIS ) tablet 2.5 mg  2.5 mg Oral BID Patel, Pranav M, MD   2.5 mg at 01/05/24 0857   diltiazem  (CARDIZEM  CD) 24 hr capsule 120 mg  120 mg Oral Daily Patel, Pranav M, MD   120 mg at 01/05/24 0857   donepezil  (ARICEPT ) tablet 5 mg  5 mg Oral QHS Patel, Pranav M, MD   5 mg at 01/04/24 2159   famotidine  (PEPCID ) tablet 20 mg  20 mg Oral QHS Patel, Pranav M, MD   20 mg at 01/04/24 2200   fluticasone  furoate-vilanterol (BREO ELLIPTA ) 100-25 MCG/ACT  1 puff  1 puff Inhalation Daily Kraig Peru, MD   1 puff at 01/05/24 0802   hydrALAZINE  (APRESOLINE ) injection 10 mg  10 mg Intravenous Q4H PRN Patel, Pranav M, MD       isosorbide  mononitrate (IMDUR ) 24 hr tablet 30 mg  30 mg Oral Daily Patel, Pranav M, MD   30 mg at 01/05/24 0857   latanoprost  (XALATAN ) 0.005 % ophthalmic solution 1 drop  1 drop Both Eyes QHS Patel, Pranav M, MD   1 drop at 01/04/24 2248   memantine  (NAMENDA ) tablet 10 mg  10 mg Oral BID Patel, Pranav M, MD   10 mg at 01/05/24 9604   mirtazapine  (REMERON ) tablet 15 mg  15 mg Oral QHS Patel, Pranav M, MD   15  mg at 01/04/24 2159   ondansetron  (ZOFRAN ) tablet 4 mg  4 mg Oral Q6H PRN Patel, Pranav M, MD       Or   ondansetron  (ZOFRAN ) injection 4 mg  4 mg Intravenous Q6H PRN Patel, Pranav M, MD       pantoprazole  (PROTONIX ) EC tablet 40 mg  40 mg Oral Daily Patel, Pranav M, MD   40 mg at 01/05/24 5409   rosuvastatin  (CRESTOR ) tablet 5 mg  5 mg Oral QHS Patel, Pranav M, MD   5 mg at 01/04/24 2159     Discharge Medications: acetaminophen  325 MG tablet Commonly known as: TYLENOL  Take 650 mg by mouth every 8 (eight) hours as needed for mild pain (pain score 1-3) or headache.    albuterol  108 (90 Base) MCG/ACT inhaler Commonly known as: VENTOLIN  HFA Inhale 2 puffs into the lungs 4 (four) times daily as needed for wheezing or shortness of breath.    apixaban  2.5 MG Tabs tablet Commonly known as: ELIQUIS  Take by mouth 2 (two) times daily.    diltiazem  120 MG 24 hr capsule Commonly known as: TIAZAC  Take 120 mg by mouth daily.    donepezil  5 MG tablet Commonly known as: ARICEPT  Take 5 mg by mouth at bedtime.    famotidine  20 MG tablet Commonly known as: PEPCID  Take 20 mg by mouth at bedtime.    fluticasone -salmeterol 100-50 MCG/ACT Aepb Commonly known as: ADVAIR Inhale 1 puff into the lungs 2 (two) times daily.    furosemide  20 MG tablet Commonly known as: Lasix  Take 1 tablet (20 mg total) by mouth every Monday, Wednesday, and Friday. Start taking on: Jan 07, 2024    isosorbide  mononitrate 30 MG 24 hr tablet Commonly known as: IMDUR  Take 1 tablet (30 mg total) by mouth daily. Start taking on: Jan 06, 2024    latanoprost  0.005 % ophthalmic solution Commonly known as: XALATAN  Place 1 drop into both eyes at bedtime.    MAGnesium -Oxide 400 (241.3 Mg) MG tablet Generic drug: magnesium  oxide Take 400 mg by mouth 2 (two) times daily.    memantine  10 MG tablet Commonly known as: NAMENDA  Take 10 mg by mouth 2 (two) times daily.    mirtazapine  15 MG tablet Commonly known as:  REMERON  Take 15 mg by mouth at bedtime.    pantoprazole  40 MG tablet Commonly known as: PROTONIX  Take 40 mg by mouth daily.    rosuvastatin  5 MG tablet Commonly known as: CRESTOR  Take 5 mg by mouth at bedtime.      Relevant Imaging Results:  Relevant Lab Results:   Additional Information SSN:  [id number]  Nelia Rogoff F Ninoska Goswick, LCSW

## 2024-03-11 NOTE — Discharge Summary (Signed)
 [facility] Hospitalist Discharge Summary  Identifying Information:  Amber Barnett 02/03/1934 [id number]  Admit date: 03/06/2024  Discharge date: 03/11/2024  Discharge Service: [facility] Hospitalist  Discharge Attending Physician:Eshwar Braxton, MD  Discharge to: Assisted living facility  Discharge Diagnoses: Principal Problem:   Acute on chronic heart failure with preserved ejection fraction (HFpEF)    (CMD) Active Problems:   Impaired functional mobility, balance, gait, and endurance    Hospital Course:  89 y.o. female  has a past medical history of Anticoagulated, Arthritis, Atrial fibrillation    (CMD), Ductal carcinoma in situ (DCIS) of left breast (2017), GERD (gastroesophageal reflux disease), Glaucoma, Hyperlipidemia, Hypertension, OSA (obstructive sleep apnea), Pulmonary hypertension    (CMD), Restrictive airway disease, SOB (shortness of breath), Stroke    (CMD) (05/2017), and Stroke (cerebrum)    (CMD) (07/2020).    89 year old female with history of HTN, CVA, A-fib, chronic HFpEF, GERD, COPD, pulmonary hypertension, cognitive deficit, presented from [place] facility for shortness of breath, and hypoxia requiring oxygen .  Patient also complains of worsening lower extremity edema and shortness of breath with dry cough for past 2 days.  On room air O2 sats 88%, improved with 2 L oxygen  nasal cannula.  Does not use home oxygen .   Patient recently hospitalized 5/15 to 01/05/2024, at [facility] for hypertensive urgency, HFpEF exacerbation requiring IV diuretics, moderate right-sided pleural effusion did not get thoracentesis due to poor candidacy, risk of pneumothorax high.   In the ER patient received IV Lasix  40 mg, Decadron and nebulizer treatment with improvement in shortness of breath.  Afebrile, pulse 80s, RR 2024, blood pressure 180s/90s, O2 sats 98% on 2 L oxygen . BNP 935, troponin 26, CBC with hemoglobin 11.7. Patient was admitted to the hospital and found following  problems,  Acute on chronic heart failure with preserved ejection fraction (HFpEF)    (CMD) Pleural effusion, POA. The patient has evidence of acute heart failure with shortness of breath and clinical finding of an elevated BNP and pitting edema.  Status post IV Lasix . Last echocardiogram Jan 04, 2024 shows EF of 60 to 65%.  Severe pulmonary hypertension.  Status post ultrasound-guided thoracentesis by IR on 03/07/2024.  Clinically patient reports improvement. Acute hypoxia  COPD -secondary to pulmonary edema, prior right-sided pleural effusion, COPD/restrictive lung disease, pulmonary hypertension - Currently on room air and saturating over 90%. -after receiving lasix  40 mg IV, she had good urine output, and improved breathing, minimal to none lower ext edema.  - she is on oral lasix  20 mg Monday/wed/Fridays ( 3 times weekly) - We have been holding Lasix  now due to increased creatinine--better and resumed home dose Lasix  from yesterday and plan is to continue home dose.. - Given IV Decadron 10 mg in the ER due to history of COPD/restrictive lung disease--completed prednisone  40 mg daily for 5 doses. - Will continue home inhalers/breathing treatment.  She looks stable/improved.   History of chronic A-fib CHADVASC score 7 will continue home medicine on low dose Eliquis  and diltiazem .   Uncontrolled hypertension Blood pressure fluctuating, overall improving.  Continue home regimen.  Blood pressure needs to be monitored closely antihypertensive need to be adjusted accordingly.   History of CVA with residual deficit Continue on Eliquis  statin PT/OT consult appreciated.  Recommended home OT/home PT at assisted living facility.  Will request for arranging PT/OT outpatient.   Pulmonary hypertension WHO class III likely per last cardiology note (due to COPD and OSA)-   Memory impairment/cognitive deficit   History of mesenteric vein  thrombosis 2015 History of left atrial thrombus on CT  01/07/2020. 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    Patient seems to be stable for discharge but she has a high potential for readmission due to multiple medical problem.  Post Discharge Follow Up Issues:  Patient is to follow with PCP in 1 to 2 weeks.  I would like to request the PCP to follow the blood pressure/heart rate and adjust medications accordingly.   Procedures: thoracentesis   _____________________________________________________________________________ Discharge Day Services: BP 129/69 (BP Location: Right arm, Patient Position: Lying)   Pulse 80   Temp 97.3 F (36.3 C) (Oral)   Resp 19   Ht 1.651 m (5' 5)   Wt 53.1 kg (117 lb 1 oz)   SpO2 97%   BMI 19.48 kg/m  Pt seen on the day of discharge and determined appropriate for discharge. General physical exam.  Patient is awake, alert and oriented.  Patient looks comfortable.  Condition at Discharge: fair  Length of Discharge: I spent 45 mins in the discharge of this patient. _____________________________________________________________________________ Discharge Medications: Patient Instructions:    Discharge Medications     Medications To Continue      Sig Disp Refill Start End  acetaminophen  325 mg tablet Commonly known as: TYLENOL   Take 650 mg by mouth every 4 (four) hours as needed for mild pain (1-3).   0     apixaban  2.5 mg Tab Commonly known as: Eliquis   Take 1 tablet (2.5 mg total) by mouth 2 (two) times a day.   0     dilTIAZem  120 mg 24 hr capsule Commonly known as: CARDIZEM  CD  Take 120 mg by mouth Once Daily.  90 capsule  3     furosemide  20 mg tablet Commonly known as: LASIX   Take 20 mg by mouth 3 (three) times a week.   0     isosorbide  mononitrate 30 mg 24 hr tablet Commonly known as: IMDUR   Take 30 mg by mouth daily.   0     magnesium  oxide 400 mg (241 mg magnesium ) Tab  TAKE 1  TABLET(400 MG) BY MOUTH TWICE DAILY  180 tablet  3     memantine  10 mg tablet Commonly known as: NAMENDA   Take 10 mg by mouth 2 (two) times a day.   0     pantoprazole  40 mg EC tablet Commonly known as: PROTONIX   Take 40 mg by mouth daily.   5     rosuvastatin  5 mg tablet Commonly known as: CRESTOR   Take 5 mg by mouth daily.   0         Stopped Medications         _____________________________________________________________________________ Pending Test Results (if blank, then none):    Most Recent Labs:     Lab Results  Component Value Date   WBC 5.58 03/10/2024   HGB 11.1 (L) 03/10/2024   HCT 33.5 (L) 03/10/2024   PLT 218 03/10/2024    Lab Results  Component Value Date   CO2 28 03/11/2024   BUN 25 03/11/2024   GLUCOSE 81 03/11/2024   CREATININE 1.03 03/11/2024   CALCIUM  7.9 (L) 03/11/2024   ALBUMIN 3.8 01/15/2024   AST 23 01/15/2024   ALT 17 01/15/2024    Lab Results  Component Value Date   NA 139 03/11/2024   K 3.7 03/11/2024   CL 104 03/11/2024   CO2 28 03/11/2024  BUN 25 03/11/2024   CREATININE 1.03 03/11/2024   CALCIUM  7.9 (L) 03/11/2024   MG 2.0 03/11/2024   PHOS 2.5 01/10/2020    Lab Results  Component Value Date   BILITOT 0.6 01/15/2024   PROT 7.6 01/15/2024   ALBUMIN 3.8 01/15/2024   ALT 17 01/15/2024   AST 23 01/15/2024    Lab Results  Component Value Date   INR 1.5 06/01/2023   APTT 61.8 (H) 01/09/2020   Hospital Radiology:  US  Thoracentesis W Imaging Right Result Date: 03/07/2024 INDICATION: 89 year old female with right pleural effusion for therapeutic thoracentesis. EXAM: ULTRASOUND GUIDED RIGHT THORACENTESIS MEDICATIONS: 1% lidocaine  10 mL COMPLICATIONS: None immediate. PROCEDURE: An ultrasound guided thoracentesis was thoroughly discussed with the patient and questions answered. The benefits, risks, alternatives and complications were also discussed. The patient understands and wishes to proceed with the  procedure. Written consent was obtained. Ultrasound was performed to localize and mark an adequate pocket of fluid in the right chest. The area was then prepped and draped in the normal sterile fashion. 1% Lidocaine  was used for local anesthesia. Under ultrasound guidance a 6 Fr Safe-T-Centesis catheter was introduced. Thoracentesis was performed. The catheter was removed and a dressing applied. FINDINGS: A total of approximately 550 mL of clear, orange fluid was removed. Samples were sent to the laboratory as requested by the clinical team.   Successful ultrasound guided right thoracentesis yielding 550 mL of pleural fluid. Performed By Lavanda Jurist, PA-C Electronically Signed   By: Juliene Balder M.D.   On: 03/07/2024 17:18   XR Chest 1 View Result Date: 03/07/2024 CLINICAL DATA:  Status post right thoracentesis EXAM: CHEST  1 VIEW COMPARISON:  Chest radiograph dated 03/06/2024 FINDINGS: Mildly low lung volumes. Improved right lung aeration. Minimal left retrocardiac linear opacity. Decreased trace right pleural effusion. No pneumothorax. Similar enlarged cardiomediastinal silhouette. No acute osseous abnormality.   1. Decreased trace right pleural effusion status post thoracentesis. No pneumothorax. 2. Improved right lung aeration. 3. Minimal left retrocardiac linear opacity, likely atelectasis. Electronically Signed   By: Limin  Xu M.D.   On: 03/07/2024 16:21   ECG 12 lead Result Date: 03/07/2024 Ventricular Rate                   78        BPM                 QRS Duration                       82        ms                  Q-T Interval                       392       ms                  QTC Calculation Bazett             446       ms                  Calculated R Axis                  235       degrees             Calculated T Axis  109       degrees             Atrial fibrillation Right superior axis deviation Anteroseptal infarct  cited on or before 13-Feb-2018 When compared with ECG  of 06-Mar-2024 21 15, Premature ventricular complexes are no longer present Confirmed by Rojelio Dunnings  [phone]  on 03-07-2024 6 11 00 AM  ECG 12 lead Result Date: 03/07/2024 Ventricular Rate                   86        BPM                 QRS Duration                       84        ms                  Q-T Interval                       396       ms                  QTC Calculation Bazett             473       ms                  Calculated R Axis                  254       degrees             Calculated T Axis                  154       degrees             Atrial fibrillation with premature ventricular complexes Right superior axis deviation Anteroseptal infarct  cited on or before 13-Feb-2018 When compared with ECG of 05-Jun-2023 02 13, Minor change in QRS axis QT has lengthened Confirmed by Rojelio Dunnings  [phone]  on 03-07-2024 6 10 47 AM  XR Chest 1 View Result Date: 03/06/2024 CLINICAL DATA:  Shortness of breath EXAM: CHEST  1 VIEW COMPARISON:  01/04/2024 FINDINGS: Cardiomegaly with vascular congestion and mild edema. Right lower lobe cystic structure as seen on prior CT. Small left and moderate right pleural effusion. Aortic atherosclerosis. No pneumothorax   Cardiomegaly with vascular congestion and mild edema. Small left and moderate right pleural effusions. Electronically Signed   By: Luke Bun M.D.   On: 03/06/2024 21:40    _____________________________________________________________________________ Discharge Instructions:   Discharge Orders     Occupational Therapy Home Health Coordination     Details:    Actions: Evaluate and Treat   Physical Therapy Home Health Coordination     Details:    Actions:  Evaluate and Treat Home Safety Evaluation          Future Appointments  Date Time Provider Department Center  05/20/2024  3:00 PM Schuyler Laneta Forte, MD [facility] [address] [facility]  06/11/2024  2:30 PM [facility] [place] [facility] [address]  06/11/2024  2:50 PM  Ozell Clyde Batman, MD [facility] OPH [facility] [address]  01/20/2025  1:45 PM [facility] 04 HC VASCULAR 5 [facility] [address]  01/20/2025  2:40 PM Isaiah Riggs  Myra RIGGERS [facility] [address]

## 2024-05-11 ENCOUNTER — Emergency Department (HOSPITAL_COMMUNITY): Payer: Medicare (Managed Care)

## 2024-05-11 ENCOUNTER — Inpatient Hospital Stay (HOSPITAL_COMMUNITY)
Admission: EM | Admit: 2024-05-11 | Discharge: 2024-05-14 | DRG: 291 | Disposition: A | Discharge status: Skilled Nursing Facility | Payer: Medicare (Managed Care) | Attending: Internal Medicine | Admitting: Internal Medicine

## 2024-05-11 ENCOUNTER — Encounter (HOSPITAL_COMMUNITY): Payer: Self-pay

## 2024-05-11 ENCOUNTER — Other Ambulatory Visit: Payer: Self-pay

## 2024-05-11 DIAGNOSIS — J189 Pneumonia, unspecified organism: Secondary | ICD-10-CM | POA: Diagnosis present

## 2024-05-11 DIAGNOSIS — Z7951 Long term (current) use of inhaled steroids: Secondary | ICD-10-CM | POA: Diagnosis not present

## 2024-05-11 DIAGNOSIS — Z79899 Other long term (current) drug therapy: Secondary | ICD-10-CM | POA: Diagnosis not present

## 2024-05-11 DIAGNOSIS — J441 Chronic obstructive pulmonary disease with (acute) exacerbation: Secondary | ICD-10-CM | POA: Diagnosis present

## 2024-05-11 DIAGNOSIS — F39 Unspecified mood [affective] disorder: Secondary | ICD-10-CM | POA: Diagnosis present

## 2024-05-11 DIAGNOSIS — I509 Heart failure, unspecified: Secondary | ICD-10-CM | POA: Diagnosis not present

## 2024-05-11 DIAGNOSIS — Z87891 Personal history of nicotine dependence: Secondary | ICD-10-CM

## 2024-05-11 DIAGNOSIS — J440 Chronic obstructive pulmonary disease with acute lower respiratory infection: Secondary | ICD-10-CM | POA: Diagnosis present

## 2024-05-11 DIAGNOSIS — I5033 Acute on chronic diastolic (congestive) heart failure: Secondary | ICD-10-CM | POA: Diagnosis present

## 2024-05-11 DIAGNOSIS — Z853 Personal history of malignant neoplasm of breast: Secondary | ICD-10-CM | POA: Diagnosis not present

## 2024-05-11 DIAGNOSIS — Z8673 Personal history of transient ischemic attack (TIA), and cerebral infarction without residual deficits: Secondary | ICD-10-CM | POA: Diagnosis not present

## 2024-05-11 DIAGNOSIS — I272 Pulmonary hypertension, unspecified: Secondary | ICD-10-CM | POA: Diagnosis present

## 2024-05-11 DIAGNOSIS — E46 Unspecified protein-calorie malnutrition: Secondary | ICD-10-CM | POA: Diagnosis present

## 2024-05-11 DIAGNOSIS — I4821 Permanent atrial fibrillation: Secondary | ICD-10-CM | POA: Diagnosis present

## 2024-05-11 DIAGNOSIS — Z91013 Allergy to seafood: Secondary | ICD-10-CM

## 2024-05-11 DIAGNOSIS — Z66 Do not resuscitate: Secondary | ICD-10-CM | POA: Diagnosis present

## 2024-05-11 DIAGNOSIS — N1831 Chronic kidney disease, stage 3a: Secondary | ICD-10-CM | POA: Diagnosis present

## 2024-05-11 DIAGNOSIS — Z91011 Allergy to milk products: Secondary | ICD-10-CM | POA: Diagnosis not present

## 2024-05-11 DIAGNOSIS — I130 Hypertensive heart and chronic kidney disease with heart failure and stage 1 through stage 4 chronic kidney disease, or unspecified chronic kidney disease: Secondary | ICD-10-CM | POA: Diagnosis present

## 2024-05-11 DIAGNOSIS — I10 Essential (primary) hypertension: Secondary | ICD-10-CM | POA: Diagnosis not present

## 2024-05-11 DIAGNOSIS — Z880 Allergy status to penicillin: Secondary | ICD-10-CM | POA: Diagnosis not present

## 2024-05-11 DIAGNOSIS — Z7901 Long term (current) use of anticoagulants: Secondary | ICD-10-CM

## 2024-05-11 DIAGNOSIS — Z823 Family history of stroke: Secondary | ICD-10-CM | POA: Diagnosis not present

## 2024-05-11 DIAGNOSIS — Z681 Body mass index (BMI) 19 or less, adult: Secondary | ICD-10-CM

## 2024-05-11 DIAGNOSIS — R0600 Dyspnea, unspecified: Secondary | ICD-10-CM

## 2024-05-11 DIAGNOSIS — N179 Acute kidney failure, unspecified: Secondary | ICD-10-CM | POA: Diagnosis not present

## 2024-05-11 DIAGNOSIS — I480 Paroxysmal atrial fibrillation: Secondary | ICD-10-CM | POA: Diagnosis not present

## 2024-05-11 DIAGNOSIS — J810 Acute pulmonary edema: Secondary | ICD-10-CM | POA: Diagnosis not present

## 2024-05-11 LAB — CBC
HCT: 36.7 % (ref 36.0–46.0)
Hemoglobin: 11.6 g/dL — ABNORMAL LOW (ref 12.0–15.0)
MCH: 26.2 pg (ref 26.0–34.0)
MCHC: 31.6 g/dL (ref 30.0–36.0)
MCV: 82.8 fL (ref 80.0–100.0)
Platelets: 198 K/uL (ref 150–400)
RBC: 4.43 MIL/uL (ref 3.87–5.11)
RDW: 19.8 % — ABNORMAL HIGH (ref 11.5–15.5)
WBC: 7.8 K/uL (ref 4.0–10.5)
nRBC: 0 % (ref 0.0–0.2)

## 2024-05-11 LAB — COMPREHENSIVE METABOLIC PANEL WITH GFR
ALT: 16 U/L (ref 0–44)
AST: 31 U/L (ref 15–41)
Albumin: 3.9 g/dL (ref 3.5–5.0)
Alkaline Phosphatase: 67 U/L (ref 38–126)
Anion gap: 14 (ref 5–15)
BUN: 10 mg/dL (ref 8–23)
CO2: 21 mmol/L — ABNORMAL LOW (ref 22–32)
Calcium: 8.6 mg/dL — ABNORMAL LOW (ref 8.9–10.3)
Chloride: 105 mmol/L (ref 98–111)
Creatinine, Ser: 1.18 mg/dL — ABNORMAL HIGH (ref 0.44–1.00)
GFR, Estimated: 45 mL/min — ABNORMAL LOW (ref >60)
Glucose, Bld: 114 mg/dL — ABNORMAL HIGH (ref 70–99)
Potassium: 4.1 mmol/L (ref 3.5–5.1)
Sodium: 140 mmol/L (ref 135–145)
Total Bilirubin: 1.1 mg/dL (ref 0.0–1.2)
Total Protein: 7.3 g/dL (ref 6.5–8.1)

## 2024-05-11 LAB — BRAIN NATRIURETIC PEPTIDE: B Natriuretic Peptide: 756.6 pg/mL — ABNORMAL HIGH (ref 0.0–100.0)

## 2024-05-11 MED ORDER — LATANOPROST 0.005 % OP SOLN
1.0000 [drp] | Freq: Every day | OPHTHALMIC | Status: DC
Start: 2024-05-11 — End: 2024-05-14
  Administered 2024-05-11 – 2024-05-13 (×3): 1 [drp] via OPHTHALMIC
  Filled 2024-05-11: qty 2.5

## 2024-05-11 MED ORDER — AZITHROMYCIN 250 MG PO TABS
500.0000 mg | ORAL_TABLET | Freq: Every day | ORAL | Status: AC
  Administered 2024-05-12 – 2024-05-13 (×2): 500 mg via ORAL
  Filled 2024-05-11 (×2): qty 2

## 2024-05-11 MED ORDER — FUROSEMIDE 10 MG/ML IJ SOLN: 20.0000 mg | Freq: Two times a day (BID) | INTRAMUSCULAR | Status: DC

## 2024-05-11 MED ORDER — SODIUM CHLORIDE 0.9 % IV SOLN
1.0000 g | Freq: Once | INTRAVENOUS | Status: AC
  Administered 2024-05-11: 1 g via INTRAVENOUS
  Filled 2024-05-11: qty 10

## 2024-05-11 MED ORDER — FUROSEMIDE 10 MG/ML IJ SOLN
40.0000 mg | Freq: Two times a day (BID) | INTRAMUSCULAR | Status: AC
  Administered 2024-05-11 – 2024-05-12 (×2): 40 mg via INTRAVENOUS
  Filled 2024-05-11 (×2): qty 4

## 2024-05-11 MED ORDER — FUROSEMIDE 10 MG/ML IJ SOLN
40.0000 mg | Freq: Once | INTRAMUSCULAR | Status: AC
  Administered 2024-05-11: 40 mg via INTRAVENOUS
  Filled 2024-05-11: qty 4

## 2024-05-11 MED ORDER — ISOSORBIDE MONONITRATE ER 30 MG PO TB24: 30.0000 mg | ORAL_TABLET | Freq: Every day | ORAL | Status: DC

## 2024-05-11 MED ORDER — FLUTICASONE FUROATE-VILANTEROL 100-25 MCG/ACT IN AEPB
1.0000 | INHALATION_SPRAY | Freq: Every day | RESPIRATORY_TRACT | Status: DC
Start: 2024-05-11 — End: 2024-05-14
  Administered 2024-05-13 – 2024-05-14 (×2): 1 via RESPIRATORY_TRACT
  Filled 2024-05-11: qty 28

## 2024-05-11 MED ORDER — DILTIAZEM HCL 60 MG PO TABS
120.0000 mg | ORAL_TABLET | ORAL | Status: AC
  Administered 2024-05-11: 120 mg via ORAL
  Filled 2024-05-11 (×2): qty 2

## 2024-05-11 MED ORDER — DONEPEZIL HCL 5 MG PO TABS
5.0000 mg | ORAL_TABLET | Freq: Every day | ORAL | Status: DC
  Administered 2024-05-11 – 2024-05-13 (×3): 5 mg via ORAL
  Filled 2024-05-11 (×3): qty 1

## 2024-05-11 MED ORDER — SODIUM CHLORIDE 0.9 % IV SOLN
500.0000 mg | Freq: Once | INTRAVENOUS | Status: AC
  Administered 2024-05-11: 500 mg via INTRAVENOUS
  Filled 2024-05-11: qty 5

## 2024-05-11 MED ORDER — FAMOTIDINE 20 MG PO TABS
20.0000 mg | ORAL_TABLET | Freq: Every day | ORAL | Status: DC
  Administered 2024-05-11 – 2024-05-13 (×3): 20 mg via ORAL
  Filled 2024-05-11 (×3): qty 1

## 2024-05-11 MED ORDER — APIXABAN 2.5 MG PO TABS
2.5000 mg | ORAL_TABLET | Freq: Two times a day (BID) | ORAL | Status: DC
  Administered 2024-05-11 – 2024-05-14 (×6): 2.5 mg via ORAL
  Filled 2024-05-11 (×6): qty 1

## 2024-05-11 MED ORDER — SODIUM CHLORIDE 0.9 % IV SOLN
1.0000 g | INTRAVENOUS | Status: DC
  Administered 2024-05-12 – 2024-05-14 (×3): 1 g via INTRAVENOUS
  Filled 2024-05-11 (×3): qty 10

## 2024-05-11 MED ORDER — MEMANTINE HCL 10 MG PO TABS
10.0000 mg | ORAL_TABLET | Freq: Two times a day (BID) | ORAL | Status: DC
  Administered 2024-05-11 – 2024-05-14 (×6): 10 mg via ORAL
  Filled 2024-05-11 (×6): qty 1

## 2024-05-11 MED ORDER — METHYLPREDNISOLONE SODIUM SUCC 125 MG IJ SOLR
125.0000 mg | Freq: Once | INTRAMUSCULAR | Status: AC
  Administered 2024-05-11: 125 mg via INTRAVENOUS
  Filled 2024-05-11: qty 2

## 2024-05-11 MED ORDER — MIRTAZAPINE 15 MG PO TABS
15.0000 mg | ORAL_TABLET | Freq: Every day | ORAL | Status: DC
  Administered 2024-05-11 – 2024-05-13 (×3): 15 mg via ORAL
  Filled 2024-05-11 (×3): qty 1

## 2024-05-11 MED ORDER — ALBUTEROL SULFATE (2.5 MG/3ML) 0.083% IN NEBU
5.0000 mg | INHALATION_SOLUTION | Freq: Once | RESPIRATORY_TRACT | Status: AC
  Administered 2024-05-11: 5 mg via RESPIRATORY_TRACT
  Filled 2024-05-11: qty 6

## 2024-05-11 MED ORDER — ISOSORBIDE MONONITRATE ER 30 MG PO TB24
30.0000 mg | ORAL_TABLET | Freq: Every day | ORAL | Status: DC
  Administered 2024-05-11 – 2024-05-14 (×4): 30 mg via ORAL
  Filled 2024-05-11 (×4): qty 1

## 2024-05-11 MED ORDER — ROSUVASTATIN CALCIUM 5 MG PO TABS
5.0000 mg | ORAL_TABLET | Freq: Every day | ORAL | Status: DC
Start: 2024-05-11 — End: 2024-05-14
  Administered 2024-05-11 – 2024-05-13 (×3): 5 mg via ORAL
  Filled 2024-05-11 (×3): qty 1

## 2024-05-11 MED ORDER — PANTOPRAZOLE SODIUM 40 MG PO TBEC
40.0000 mg | DELAYED_RELEASE_TABLET | Freq: Every day | ORAL | Status: DC
Start: 2024-05-12 — End: 2024-05-14
  Administered 2024-05-12 – 2024-05-14 (×3): 40 mg via ORAL
  Filled 2024-05-11 (×3): qty 1

## 2024-05-11 MED ORDER — DILTIAZEM HCL ER COATED BEADS 120 MG PO CP24
120.0000 mg | ORAL_CAPSULE | Freq: Every day | ORAL | Status: DC
  Administered 2024-05-12 – 2024-05-14 (×3): 120 mg via ORAL
  Filled 2024-05-11 (×3): qty 1

## 2024-05-11 NOTE — H&P (Addendum)
 History and Physical    Patient: Amber Barnett FMW:[id number] DOB: 1934/05/08 DOA: 05/11/2024 DOS: the patient was seen and examined on 05/11/2024 PCP: Prescilla Golas, PA-C  Patient coming from: Home  Chief Complaint:  Chief Complaint  Patient presents with   Shortness of Breath   HPI: Amber Barnett is a 89 y.o. female with medical history significant of Afib, HTN, CVA, and DVT p/w flash pulmonary edema iso elevated BP c/b HFpEF exacerbation (EF 60-65% in 12/2023) and possible multifocal CAP.  The patient presented with shortness of breath that began last night around 8 PM while sitting down. The patient reported that the episode was sudden in onset and was associated with elevated blood pressure, though the specific measurement was unknown. The patient's sister became concerned and brought the patient to the hospital. The patient reported feeling okay at the time of the appointment and mentioned taking their prescribed medications, including a dose of blood pressure medication before arriving at the hospital. There were no reports of dietary changes or missed medications. The patient described no preceding days of feeling unwell, indicating that the symptoms were acute and isolated to that evening.  In the ED, pt hypertensive, tachycardic, and tachypneic W/O hypoxia. Labs notable for Cr 1.18, and BNP 756. Labs notable for CXR c/f multifocal CAP and b/l pleural effusions.   Review of Systems: As mentioned in the history of present illness. All other systems reviewed and are negative. Past Medical History:  Diagnosis Date   Atrial fibrillation (HCC)    DVT (deep venous thrombosis) (HCC)    Dysrhythmia    History of left breast cancer    Hypertension    Stroke [facility])    Past Surgical History:  Procedure Laterality Date   COLONOSCOPY WITH PROPOFOL  N/A 12/29/2019   Procedure: COLONOSCOPY WITH PROPOFOL ;  Surgeon: Burnette Fallow, MD;  Location: [facility];  Service:  Endoscopy;  Laterality: N/A;   IR ANGIO INTRA EXTRACRAN SEL COM CAROTID INNOMINATE UNI R MOD SED  05/18/2017   IR ANGIO VERTEBRAL SEL SUBCLAVIAN INNOMINATE UNI R MOD SED  05/18/2017   IR PERCUTANEOUS ART THROMBECTOMY/INFUSION INTRACRANIAL INC DIAG ANGIO  05/18/2017   IR RADIOLOGIST EVAL & MGMT  07/19/2017   RADIOLOGY WITH ANESTHESIA N/A 05/18/2017   Procedure: RADIOLOGY WITH ANESTHESIA;  Surgeon: Dolphus Carrion, MD;  Location: [facility] OR;  Service: Radiology;  Laterality: N/A;   Social History:  reports that she has quit smoking. Her smoking use included cigarettes. She has never used smokeless tobacco. She reports that she does not drink alcohol and does not use drugs.  Allergies  Allergen Reactions   Fish-Derived Products Swelling    Facial swelling then tongue swelling   Milk-Related Compounds Diarrhea    WHOLE MILK   Other Swelling and Diarrhea    Allergy to all nuts - facial swelling, then tongue swelling  WHOLE MILK   Wild Lettuce Extract (Lactuca Virosa) Diarrhea   Amoxicillin Nausea And Vomiting    Family History  Problem Relation Age of Onset   Stroke Son     Prior to Admission medications   Medication Sig Start Date End Date Taking? Authorizing Provider  acetaminophen  (TYLENOL ) 325 MG tablet Take 650 mg by mouth every 8 (eight) hours as needed for mild pain (pain score 1-3) or headache. 01/13/20   [provider]  albuterol  (VENTOLIN  HFA) 108 (90 Base) MCG/ACT inhaler Inhale 2 puffs into the lungs 4 (four) times daily as needed for wheezing or shortness of breath. 03/11/20  [provider]  apixaban  (ELIQUIS ) 2.5 MG TABS tablet Take by mouth 2 (two) times daily.    [provider]  diltiazem  (TIAZAC ) 120 MG 24 hr capsule Take 120 mg by mouth daily. 04/26/17   [provider]  donepezil  (ARICEPT ) 5 MG tablet Take 5 mg by mouth at bedtime. 12/20/23   [provider]  famotidine  (PEPCID ) 20 MG tablet Take 20 mg by mouth at bedtime. 11/27/19    [provider]  fluticasone -salmeterol (ADVAIR) 100-50 MCG/ACT AEPB Inhale 1 puff into the lungs 2 (two) times daily. 12/06/23   [provider]  furosemide  (LASIX ) 20 MG tablet Take 1 tablet (20 mg total) by mouth every Monday, Wednesday, and Friday. 01/07/24 01/06/25  Patel, Pranav M, MD  isosorbide  mononitrate (IMDUR ) 30 MG 24 hr tablet Take 1 tablet (30 mg total) by mouth daily. 01/06/24   Tobie Yetta HERO, MD  latanoprost  (XALATAN ) 0.005 % ophthalmic solution Place 1 drop into both eyes at bedtime. 10/16/16   [provider]  MAGNESIUM -OXIDE 400 (241.3 Mg) MG tablet Take 400 mg by mouth 2 (two) times daily. 12/20/19   [provider]  memantine  (NAMENDA ) 10 MG tablet Take 10 mg by mouth 2 (two) times daily.    [provider]  mirtazapine  (REMERON ) 15 MG tablet Take 15 mg by mouth at bedtime. 12/17/23   [provider]  pantoprazole  (PROTONIX ) 40 MG tablet Take 40 mg by mouth daily. 04/26/17   [provider]  rosuvastatin  (CRESTOR ) 5 MG tablet Take 5 mg by mouth at bedtime. 12/17/23   [provider]    Physical Exam: Vitals:   05/11/24 0930 05/11/24 0945 05/11/24 1000 05/11/24 1014  BP: (!) 166/110 (!) 198/119 (!) 196/84 (!) 196/84  Pulse: 99 61 89   Resp: (!) 27 (!) 29 (!) 27   Temp:      TempSrc:      SpO2: 93% 95% 93%   Weight:      Height:       General: Alert, oriented x3, resting comfortably in no acute distress HEENT: EOMI, oropharynx clear, moist mucous membranes, hearing intact Neck: Trachea midline and no gross thyromegaly Respiratory: Lungs clear to auscultation bilaterally with normal respiratory effort; no w/r/r Cardiovascular: Regular rate and rhythm w/o m/r/g Abdomen: Soft, nontender, nondistended. Positive bowel sounds MSK: No obvious joint deformities or swelling Skin: No obvious rashes or lesions Neurologic: Awake, alert, spontaneously moves all extremities, strength intact Psychiatric: Appropriate  mood and affect, conversational and cooperative    Data Reviewed:  Lab Results  Component Value Date   WBC 7.8 05/11/2024   HGB 11.6 (L) 05/11/2024   HCT 36.7 05/11/2024   MCV 82.8 05/11/2024   PLT 198 05/11/2024   Lab Results  Component Value Date   GLUCOSE 114 (H) 05/11/2024   CALCIUM  8.6 (L) 05/11/2024   NA 140 05/11/2024   K 4.1 05/11/2024   CO2 21 (L) 05/11/2024   CL 105 05/11/2024   BUN 10 05/11/2024   CREATININE 1.18 (H) 05/11/2024   Lab Results  Component Value Date   ALT 16 05/11/2024   AST 31 05/11/2024   ALKPHOS 67 05/11/2024   BILITOT 1.1 05/11/2024   Lab Results  Component Value Date   INR 1.4 (H) 11/10/2022   INR 1.2 08/14/2020   INR 2.1 (H) 08/11/2020   Radiology: DG Chest Port 1 View Result Date: 05/11/2024 CLINICAL DATA:  dyspnea EXAM: PORTABLE CHEST 1 VIEW COMPARISON:  CT angiography chest 01/04/2024,  chest x-ray 04/03/2024 FINDINGS: The heart and mediastinal contours are unchanged. Atherosclerotic plaque. Bilateral lower lung zone patchy airspace opacities. No pulmonary edema. Interval development of bilateral trace pleural effusion. No pneumothorax. No acute osseous abnormality. IMPRESSION: 1. Bilateral lower lung zone patchy airspace opacities. Interval development of bilateral trace pleural effusion. Findings suggestive of multifocal pneumonia. Followup PA and lateral chest X-ray is recommended in 3-4 weeks following therapy to ensure resolution and exclude underlying malignancy. 2.  Aortic Atherosclerosis (ICD10-I70.0). Electronically Signed   By: Morgane  Naveau M.D.   On: 05/11/2024 10:12    Assessment and Plan: 67F h/o Afib, HTN, CVA, and DVT p/w flash pulmonary edema iso elevated BP c/b HFpEF exacerbation (EF 60-65% in 12/2023) and possible multifocal CAP.  SOB 2/2 flash pulmonary edema iso elevated BP H/o HFpEF Elevated BNP -IV lasix  40mg  BID for now; goal net neg 1-2L/d; strict I/Os; daily standing weights; K>4/Mg>2 -PTA diltiazem , and  ISMN -Ensure goal BP <140/90 on regime at discharge  Multifocal CAP -IV CTX 1g daily to complete 5 day CAP course -PO azithromycin  500mg  daily to complete 3 day CAP course -Duonebs prn -Wean O2 as tolerated -Ambulatory pulse ox prior to d/c  Afib -PTA Eliquis  2.5mg  BID and diltiazem  120mg  daily  Mood disorder -PTA mirtazapine , memantine , and donepezil    Advance Care Planning:   Code Status: Prior   Consults: N/A  Family Communication: Niece  Severity of Illness: The appropriate patient status for this patient is INPATIENT. Inpatient status is judged to be reasonable and necessary in order to provide the required intensity of service to ensure the patient's safety. The patient's presenting symptoms, physical exam findings, and initial radiographic and laboratory data in the context of their chronic comorbidities is felt to place them at high risk for further clinical deterioration. Furthermore, it is not anticipated that the patient will be medically stable for discharge from the hospital within 2 midnights of admission.   * I certify that at the point of admission it is my clinical judgment that the patient will require inpatient hospital care spanning beyond 2 midnights from the point of admission due to high intensity of service, high risk for further deterioration and high frequency of surveillance required.*   ------- I spent 61 minutes reviewing previous notes, at the bedside counseling/discussing the treatment plan, and performing clinical documentation.  Author: Marsha Ada, MD 05/11/2024 10:50 AM  For on call review [email or web address].

## 2024-05-11 NOTE — ED Notes (Signed)
 CCMD called, pt on monitor

## 2024-05-11 NOTE — ED Provider Notes (Signed)
 [facility] Provider Note   CSN: [id number] Arrival date & time: 05/11/24  9146     Patient presents with: Shortness of Breath   Amber Barnett is a 89 y.o. female.   HPI 89 year old female history of hypertension, A-fib, on chronic anticoagulation, history of stroke, history of rectal bleeding, history of CHF, pulmonary hypertension, presents today complaining of dyspnea and wheezing.  EMS administered albuterol  and DuoNeb prehospital with some relief.  Dyspnea began last night and has worsened overnight.  She has not noted any increased leg swelling, some increased cough but no productive cough, no lateralized swelling no history of DVT or PE,    Prior to Admission medications   Medication Sig Start Date End Date Taking? Authorizing Provider  acetaminophen  (TYLENOL ) 325 MG tablet Take 650 mg by mouth every 8 (eight) hours as needed for mild pain (pain score 1-3) or headache. 01/13/20   [provider]  albuterol  (VENTOLIN  HFA) 108 (90 Base) MCG/ACT inhaler Inhale 2 puffs into the lungs 4 (four) times daily as needed for wheezing or shortness of breath. 03/11/20   [provider]  apixaban  (ELIQUIS ) 2.5 MG TABS tablet Take by mouth 2 (two) times daily.    [provider]  diltiazem  (TIAZAC ) 120 MG 24 hr capsule Take 120 mg by mouth daily. 04/26/17   [provider]  donepezil  (ARICEPT ) 5 MG tablet Take 5 mg by mouth at bedtime. 12/20/23   [provider]  famotidine  (PEPCID ) 20 MG tablet Take 20 mg by mouth at bedtime. 11/27/19   [provider]  fluticasone -salmeterol (ADVAIR) 100-50 MCG/ACT AEPB Inhale 1 puff into the lungs 2 (two) times daily. 12/06/23   [provider]  furosemide  (LASIX ) 20 MG tablet Take 1 tablet (20 mg total) by mouth every Monday, Wednesday, and Friday. 01/07/24 01/06/25  Patel, Pranav M, MD  isosorbide  mononitrate (IMDUR ) 30 MG 24 hr tablet Take 1 tablet  (30 mg total) by mouth daily. 01/06/24   Tobie Yetta HERO, MD  latanoprost  (XALATAN ) 0.005 % ophthalmic solution Place 1 drop into both eyes at bedtime. 10/16/16   [provider]  MAGNESIUM -OXIDE 400 (241.3 Mg) MG tablet Take 400 mg by mouth 2 (two) times daily. 12/20/19   [provider]  memantine  (NAMENDA ) 10 MG tablet Take 10 mg by mouth 2 (two) times daily.    [provider]  mirtazapine  (REMERON ) 15 MG tablet Take 15 mg by mouth at bedtime. 12/17/23   [provider]  pantoprazole  (PROTONIX ) 40 MG tablet Take 40 mg by mouth daily. 04/26/17   [provider]  rosuvastatin  (CRESTOR ) 5 MG tablet Take 5 mg by mouth at bedtime. 12/17/23   [provider]    Allergies: Fish-derived products, Milk-related compounds, Other, Wild lettuce extract (lactuca virosa), and Amoxicillin    Review of Systems  Updated Vital Signs BP (!) 196/84   Pulse 89   Temp 98.2 F (36.8 C) (Oral)   Resp (!) 27   Ht 1.707 m (5' 7.2)   Wt 54 kg   SpO2 93%   BMI 18.53 kg/m   Physical Exam Vitals and nursing note reviewed.  HENT:     Head: Normocephalic and atraumatic.     Mouth/Throat:     Mouth: Mucous membranes are moist.  Eyes:     Pupils: Pupils are equal, round, and reactive to light.  Cardiovascular:     Rate and Rhythm: Tachycardia present. Rhythm irregular.  Pulmonary:  Effort: Tachypnea present.     Breath sounds: Examination of the right-lower field reveals wheezing and rhonchi. Examination of the left-lower field reveals wheezing and rhonchi. Wheezing and rhonchi present.  Musculoskeletal:        General: Normal range of motion.     Cervical back: Normal range of motion and neck supple.     Right lower leg: No edema.     Left lower leg: No edema.  Skin:    General: Skin is warm and dry.     Capillary Refill: Capillary refill takes less than 2 seconds.  Neurological:     General: No focal deficit present.     Mental Status: She is  alert.  Psychiatric:        Mood and Affect: Mood normal.     (all labs ordered are listed, but only abnormal results are displayed) Labs Reviewed  CBC - Abnormal; Notable for the following components:      Result Value   Hemoglobin 11.6 (*)    RDW 19.8 (*)    All other components within normal limits  COMPREHENSIVE METABOLIC PANEL WITH GFR - Abnormal; Notable for the following components:   CO2 21 (*)    Glucose, Bld 114 (*)    Creatinine, Ser 1.18 (*)    Calcium  8.6 (*)    GFR, Estimated 45 (*)    All other components within normal limits  BRAIN NATRIURETIC PEPTIDE - Abnormal; Notable for the following components:   B Natriuretic Peptide 756.6 (*)    All other components within normal limits    EKG: EKG Interpretation Date/Time:  Sunday May 11 2024 09:08:44 EDT Ventricular Rate:  105 PR Interval:    QRS Duration:  80 QT Interval:  347 QTC Calculation: 459 R Axis:   -85  Text Interpretation: Atrial fibrillation Left anterior fascicular block Anteroseptal infarct, old Nonspecific T abnormalities, lateral leads Confirmed by Levander Houston [phone]) on 05/11/2024 10:24:40 AM  Radiology: [facility] Chest Port 1 View Result Date: 05/11/2024 CLINICAL DATA:  dyspnea EXAM: PORTABLE CHEST 1 VIEW COMPARISON:  CT angiography chest 01/04/2024, chest x-Sumi Lye 04/03/2024 FINDINGS: The heart and mediastinal contours are unchanged. Atherosclerotic plaque. Bilateral lower lung zone patchy airspace opacities. No pulmonary edema. Interval development of bilateral trace pleural effusion. No pneumothorax. No acute osseous abnormality. IMPRESSION: 1. Bilateral lower lung zone patchy airspace opacities. Interval development of bilateral trace pleural effusion. Findings suggestive of multifocal pneumonia. Followup PA and lateral chest X-Latalia Etzler is recommended in 3-4 weeks following therapy to ensure resolution and exclude underlying malignancy. 2.  Aortic Atherosclerosis (ICD10-I70.0). Electronically Signed   By:  Morgane  Naveau M.D.   On: 05/11/2024 10:12     .Critical Care  Performed by: Levander Houston, MD Authorized by: Levander Houston, MD   Critical care provider statement:    Critical care time (minutes):  45   Critical care end time:  05/11/2024 10:52 AM   Critical care time was exclusive of:  Separately billable procedures and treating other patients and teaching time   Critical care was time spent personally by me on the following activities:  Development of treatment plan with patient or surrogate, discussions with consultants, evaluation of patient's response to treatment, examination of patient, ordering and review of laboratory studies, ordering and review of radiographic studies, ordering and performing treatments and interventions, pulse oximetry, re-evaluation of patient's condition and review of old charts    Medications Ordered in the ED  cefTRIAXone  (ROCEPHIN ) 1 g in sodium chloride  0.9 % 100  mL IVPB (has no administration in time range)  azithromycin  (ZITHROMAX ) 500 mg in sodium chloride  0.9 % 250 mL IVPB (has no administration in time range)  methylPREDNISolone  sodium succinate (SOLU-MEDROL ) 125 mg/2 mL injection 125 mg (has no administration in time range)  isosorbide  mononitrate (IMDUR ) 24 hr tablet 30 mg (has no administration in time range)  albuterol  (PROVENTIL ) (2.5 MG/3ML) 0.083% nebulizer solution 5 mg (has no administration in time range)  furosemide  (LASIX ) injection 40 mg (40 mg Intravenous Given 05/11/24 0934)  diltiazem  (CARDIZEM ) tablet 120 mg (120 mg Oral Given 05/11/24 1014)                                    Medical Decision Making Amount and/or Complexity of Data Reviewed Labs: ordered. Radiology: ordered.  Risk Prescription drug management.    89 year old female history of COPD, CHF, A-fib, presents today with increased dyspnea and some coughing since yesterday.  She has no significant peripheral edema.  She has not had associated chest pain. Here she  has some wheezing and crackles. Differential diagnosis includes but is not limited to CHF, A-fib RVR, infection such as pneumonia, PE. Patient with known history of CHF and appears to have some slight volume overload versus infiltrates on chest x-Konstantin Lehnen.  BNP is mildly elevated at 756.  Initial blood pressures were high normal.  Patient is given Lasix  40 here in ED Patient with possible bilateral infiltrates on chest x-Karis Emig as interpreted by myself and radiologist. Patient without fever, but with cough, normal white blood cell count.  Patient covered empirically with Rocephin  and Zithromax . Patient had some wheezing on my exam but had improved from reported prehospital wheezing.  Prehospital recent she received albuterol  and DuoNeb.  She is treated here with Solu-Medrol .  Additional albuterol  will be ordered Patient's A-fib with some rapid rate increased to 120s.  However this is unchanged from her first prior.  She is given additional dose of her p.o. Cardizem  here. Blood pressure has increased here.  Plan manual check and will add patient's a.m. Imdur .  Care discussed with Dr. Georgina, on-call for hospitalist.  He will see patient for admission     Final diagnoses:  COPD exacerbation (HCC)  Dyspnea, unspecified type  Acute on chronic congestive heart failure, unspecified heart failure type [facility])    ED Discharge Orders     None          Levander Houston, MD 05/11/24 1053

## 2024-05-11 NOTE — ED Notes (Signed)
Awaiting pharmacy to verify medications.

## 2024-05-11 NOTE — ED Notes (Signed)
 Pt provided with lunch tray and assistance setting up

## 2024-05-11 NOTE — ED Notes (Signed)
Pt and all belongings transported upstairs.  

## 2024-05-11 NOTE — ED Notes (Signed)
 Patient oxygen  dropped to 86% RA while ambulating to bedside commode. Put patient on 2L [place] temporarily with oxygen  coming up to 100%. Patient now on 95% RA.   Dr. Levander notified.

## 2024-05-11 NOTE — ED Triage Notes (Signed)
 BIB EMS from [facility] r/t SOB and wheezing that worsened over night. EMS administered albuterol  and duoneb x2 with some relief. Noted increased WOB with audible wheezing on arrival. A&Ox4. HX CHF

## 2024-05-11 NOTE — Plan of Care (Signed)

## 2024-05-12 DIAGNOSIS — J810 Acute pulmonary edema: Secondary | ICD-10-CM

## 2024-05-12 DIAGNOSIS — I5033 Acute on chronic diastolic (congestive) heart failure: Secondary | ICD-10-CM

## 2024-05-12 DIAGNOSIS — I10 Essential (primary) hypertension: Secondary | ICD-10-CM

## 2024-05-12 DIAGNOSIS — I480 Paroxysmal atrial fibrillation: Secondary | ICD-10-CM | POA: Diagnosis not present

## 2024-05-12 DIAGNOSIS — J189 Pneumonia, unspecified organism: Secondary | ICD-10-CM

## 2024-05-12 NOTE — Plan of Care (Signed)

## 2024-05-12 NOTE — Plan of Care (Signed)
   Problem: Clinical Measurements: Goal: Respiratory complications will improve Outcome: Progressing   Problem: Activity: Goal: Risk for activity intolerance will decrease Outcome: Progressing   Problem: Coping: Goal: Level of anxiety will decrease Outcome: Progressing

## 2024-05-12 NOTE — Progress Notes (Signed)
 Heart Failure Navigator Progress Note  Assessed for Heart & Vascular TOC clinic readiness.  Patient does not meet criteria due to EF 60-65% , per MD note history of Dementia. No HF TOC. .   Navigator will sign off at this time.   Stephane Haddock, BSN, Scientist, clinical (histocompatibility and immunogenetics) Only

## 2024-05-12 NOTE — TOC Initial Note (Signed)
 Transition of Care [facility]) - Initial/Assessment Note    Patient Details  Name: Amber Barnett MRN: [id number] Date of Birth: [birth year]  [facility]) CM/SW Contact:    Luise JAYSON Pan, LCSWA Phone Number: 05/12/2024, 12:48 PM  Clinical Narrative:    Patient is from [facility]. CSW spoke with Renda [phone]), who stated patient can come back when medically ready. CSW spoke with patients niece, Amber Barnett [phone]) about patient discharging back when medically ready. Amber Barnett stated yes and she is able to provide transportation for her aunt to go back to ALF.    CSW will continue to follow.                 Expected Discharge Plan: Assisted Living Barriers to Discharge: Continued Medical Work up   Patient Goals and CMS Choice Patient states their goals for this hospitalization and ongoing recovery are:: To return to ALF          Expected Discharge Plan and Services In-house Referral: Clinical Social Work     Living arrangements for the past 2 months: Assisted Living Facility                                      Prior Living Arrangements/Services Living arrangements for the past 2 months: Assisted Living Facility Lives with:: Facility Resident Patient language and need for interpreter reviewed:: Yes Do you feel safe going back to the place where you live?: Yes      Need for Family Participation in Patient Care: Yes (Comment) Care giver support system in place?: Yes (comment)   Criminal Activity/Legal Involvement Pertinent to Current Situation/Hospitalization: No - Comment as needed  Activities of Daily Living   ADL Screening (condition at time of admission) Independently performs ADLs?: Yes (appropriate for developmental age) Is the patient deaf or have difficulty hearing?: No Does the patient have difficulty seeing, even when wearing glasses/contacts?: No Does the patient have difficulty concentrating,  remembering, or making decisions?: No  Permission Sought/Granted Permission sought to share information with : Facility Medical sales representative, Family Supports Permission granted to share information with : No (From a facility, family info on chart)  Share Information with NAME: Baker,Amber Barnett  Permission granted to share info w AGENCY: ALF  Permission granted to share info w Relationship: Neice  Permission granted to share info w Contact Information: [phone]  Emotional Assessment Appearance:: Appears younger than stated age Attitude/Demeanor/Rapport: Unable to Assess Affect (typically observed): Unable to Assess Orientation: : Oriented to Situation, Oriented to Place, Oriented to  Time, Oriented to Self Alcohol / Substance Use: Not Applicable Psych Involvement: No (comment)  Admission diagnosis:  COPD exacerbation (HCC) [J44.1] Acute on chronic heart failure (HCC) [I50.9] Dyspnea, unspecified type [R06.00] Acute on chronic congestive heart failure, unspecified heart failure type [facility]) [I50.9] Patient Active Problem List   Diagnosis Date Noted   Acute on chronic heart failure (HCC) 05/11/2024   Acute on chronic diastolic CHF (congestive heart failure) (HCC) 01/04/2024   Hypertensive urgency 01/04/2024   History of CVA (cerebrovascular accident) 01/04/2024   Metabolic acidosis 01/04/2024   Positive D dimer 01/04/2024   Pulmonary hypertension (HCC) 01/04/2024   CVA (cerebral vascular accident) (HCC) 08/14/2020   GIB (gastrointestinal bleeding) 08/11/2020   Rectal bleeding 12/25/2019   Current use of long term anticoagulation 05/21/2017   Cerebral embolism with cerebral infarction (HCC) 05/18/2017   Essential hypertension  05/08/2017   Permanent atrial fibrillation (HCC) 05/08/2017   PCP:  Prescilla Golas, PA-C Pharmacy:   [facility] [phone] - [place], [place] - [address] [place] [address] Phone: [phone] Fax:  [phone]  [id number] - [place], [place] - [address] AT [facility] [address] [place] [address] Phone: [phone] Fax: [phone]     Social Drivers of Health (SDOH) Social History: SDOH Screenings   Food Insecurity: No Food Insecurity (05/11/2024)  Housing: Low Risk  (05/11/2024)  Transportation Needs: No Transportation Needs (05/11/2024)  Utilities: Not At Risk (05/11/2024)  Financial Resource Strain: Low Risk  (08/22/2022)   Received from [facility]  Social Connections: Moderately Isolated (05/11/2024)  Stress: No Stress Concern Present (06/27/2023)   Received from [facility]  Tobacco Use: Medium Risk (05/11/2024)   SDOH Interventions:     Readmission Risk Interventions     No data to display

## 2024-05-12 NOTE — Progress Notes (Signed)
 Progress Note   Patient: Amber Barnett FMW:[id number] DOB: Mar 31, 1934 DOA: 05/11/2024     1 DOS: the patient was seen and examined on 05/12/2024   Brief hospital course: Markie Heffernan is a 89 y.o. female with medical history significant of Afib, Hypertension, CVA, and DVT presented with shortness of breath, high blood pressure. Chest xray showed pleural effusion, bilateral patchy opacities. Admitted for flash pulmonary edema due to HFpEF exacerbation (EF 60-65% in 12/2023) and multifocal CAP.   Assessment and Plan: Acute on chronic diastolic heart failure Pulmonary edema- Continue IV Lasix  40mg  BID. Continue home dose Cardizem , imdur . Monitor daily weights, strict input and output. Fluid restriction, CHF instruction provided.  Multifocal pneumonia- Bilateral patchy infiltrates on chest xray consistent with pneumonia. Continue Rocephin  and azithromycin . Continue antitussives. Out of bed to chair, incentive spirometry.  Hypertension- BP better controlled, after resuming imdur , Cardizem .  Paroxysmal Afib- Resumed Eliquis . Continue telemetry.  At risk for malnutrition- BMI 17.78. Diet education provided.    Continue home dose mirtazapine , memantine , and donepezil   Out of bed to chair. Incentive spirometry. Nursing supportive care. Fall, aspiration precautions. Diet:  Diet Orders (From admission, onward)     Start     Ordered   05/11/24 1536  Diet regular Room service appropriate? Yes; Fluid consistency: Thin  Diet effective now       Question Answer Comment  Room service appropriate? Yes   Fluid consistency: Thin      05/11/24 1535           DVT prophylaxis: apixaban  (ELIQUIS ) tablet 2.5 mg Start: 05/11/24 2200 apixaban  (ELIQUIS ) tablet 2.5 mg  Level of care: Telemetry Cardiac   Code Status: Do not attempt resuscitation (DNR) PRE-ARREST INTERVENTIONS DESIRED  Subjective: Patient is seen and examined today morning. She is sitting in chair.  Shortness of breath much better. States she is having good urine output. Eating fair. Off supplemental oxygen .  Physical Exam: Vitals:   05/11/24 1900 05/11/24 2325 05/12/24 0320 05/12/24 0757  BP: (!) 167/88 (!) 145/75 133/77 (!) 165/87  Pulse: 63 76 67 74  Resp: 19 19 19 20   Temp: 98.1 F (36.7 C) 98 F (36.7 C) 98 F (36.7 C) 97.7 F (36.5 C)  TempSrc: Oral Oral Oral Oral  SpO2: 93% 91% 92% 91%  Weight:   51.8 kg   Height:        General - Elderly African American female, no apparent distress HEENT - PERRLA, EOMI, atraumatic head, non tender sinuses. Lung - Clear, basal rales, rhonchi, wheezes. Heart - S1, S2 heard, no murmurs, rubs, trace pedal edema. Abdomen - Soft, non tender, bowel sounds good Neuro - Alert, awake and oriented x 3, non focal exam. Skin - Warm and dry.  Data Reviewed:      Latest Ref Rng & Units 05/11/2024    9:26 AM 01/05/2024    2:31 AM 01/04/2024   12:30 AM  CBC  WBC 4.0 - 10.5 K/uL 7.8  5.5  7.0   Hemoglobin 12.0 - 15.0 g/dL 88.3  88.4  88.8   Hematocrit 36.0 - 46.0 % 36.7  32.3  33.6   Platelets 150 - 400 K/uL 198  209  179       Latest Ref Rng & Units 05/11/2024    9:26 AM 01/05/2024    2:31 AM 01/04/2024   12:30 AM  BMP  Glucose 70 - 99 mg/dL 885  82  894   BUN 8 - 23 mg/dL 10  11  11   Creatinine 0.44 - 1.00 mg/dL 8.81  8.90  9.08   Sodium 135 - 145 mmol/L 140  138  135   Potassium 3.5 - 5.1 mmol/L 4.1  3.4  4.0   Chloride 98 - 111 mmol/L 105  102  105   CO2 22 - 32 mmol/L 21  23  19    Calcium  8.9 - 10.3 mg/dL 8.6  8.5  8.6    DG Chest Port 1 View Result Date: 05/11/2024 CLINICAL DATA:  dyspnea EXAM: PORTABLE CHEST 1 VIEW COMPARISON:  CT angiography chest 01/04/2024, chest x-ray 04/03/2024 FINDINGS: The heart and mediastinal contours are unchanged. Atherosclerotic plaque. Bilateral lower lung zone patchy airspace opacities. No pulmonary edema. Interval development of bilateral trace pleural effusion. No pneumothorax. No acute osseous  abnormality. IMPRESSION: 1. Bilateral lower lung zone patchy airspace opacities. Interval development of bilateral trace pleural effusion. Findings suggestive of multifocal pneumonia. Followup PA and lateral chest X-ray is recommended in 3-4 weeks following therapy to ensure resolution and exclude underlying malignancy. 2.  Aortic Atherosclerosis (ICD10-I70.0). Electronically Signed   By: Morgane  Naveau M.D.   On: 05/11/2024 10:12    Family Communication: Discussed with patient, understand and agree. All questions answered.  Disposition: Status is: Inpatient Remains inpatient appropriate because: diuresis, PT/ OT.  Planned Discharge Destination: Home with Home Health     Time spent: 44 minutes  Author: Concepcion Riser, MD 05/12/2024 3:07 PM Secure chat 7am to 7pm For on call review [email or web address].

## 2024-05-13 DIAGNOSIS — I480 Paroxysmal atrial fibrillation: Secondary | ICD-10-CM | POA: Diagnosis not present

## 2024-05-13 DIAGNOSIS — I5033 Acute on chronic diastolic (congestive) heart failure: Secondary | ICD-10-CM | POA: Diagnosis not present

## 2024-05-13 DIAGNOSIS — I10 Essential (primary) hypertension: Secondary | ICD-10-CM | POA: Diagnosis not present

## 2024-05-13 DIAGNOSIS — J810 Acute pulmonary edema: Secondary | ICD-10-CM | POA: Diagnosis not present

## 2024-05-13 LAB — BASIC METABOLIC PANEL WITH GFR
Anion gap: 11 (ref 5–15)
BUN: 25 mg/dL — ABNORMAL HIGH (ref 8–23)
CO2: 27 mmol/L (ref 22–32)
Calcium: 8.4 mg/dL — ABNORMAL LOW (ref 8.9–10.3)
Chloride: 100 mmol/L (ref 98–111)
Creatinine, Ser: 1.39 mg/dL — ABNORMAL HIGH (ref 0.44–1.00)
GFR, Estimated: 37 mL/min — ABNORMAL LOW (ref >60)
Glucose, Bld: 85 mg/dL (ref 70–99)
Potassium: 3.5 mmol/L (ref 3.5–5.1)
Sodium: 138 mmol/L (ref 135–145)

## 2024-05-13 MED ORDER — ACETAMINOPHEN 325 MG PO TABS
650.0000 mg | ORAL_TABLET | Freq: Four times a day (QID) | ORAL | Status: DC | PRN
  Administered 2024-05-13: 650 mg via ORAL
  Filled 2024-05-13: qty 2

## 2024-05-13 MED ORDER — FUROSEMIDE 20 MG PO TABS
20.0000 mg | ORAL_TABLET | ORAL | Status: DC
  Administered 2024-05-14: 20 mg via ORAL
  Filled 2024-05-13: qty 1

## 2024-05-13 NOTE — Evaluation (Signed)
 Physical Therapy Evaluation Patient Details Name: Amber Barnett MRN: [id number] DOB: 1933/12/31 Today's Date: 05/13/2024  History of Present Illness  89 y.o. female presented 9/21 with shortness of breath, high blood pressure. Chest xray showed pleural effusion, bilateral patchy opacities. Admitted for flash pulmonary edema due to HFpEF exacerbation (EF 60-65% in 12/2023) and multifocal CAP.Chest xray showed pleural effusion, bilateral patchy opacities. Admitted for flash pulmonary edema due to HFpEF exacerbation (EF 60-65% in 12/2023) and multifocal CAP. with medical history significant of Afib, Hypertension, CVA, and DVT.  Clinical Impression  Pt admitted with above diagnosis. Previously independent using SPC/RW as needed at her ALF. Able to bath/dress herself and walks to dining hall for meals. Denies any falls in past 6 months. Oriented to self and location - states year is 2054. Demonstrates impaired balance and high fall risk based on BERG balance test. Pt able to ambulate 100 feet with RW and mild instability requiring CGA today. SpO2 dropped to 87% on RA, recovers quickly to mid 90s with seated rest. Educated on IS use and encouraged OOB frequently with staff. HHPT would be helpful at d/c. If she fails to improve further may benefit more from inpatient follow up therapy, <3 hours/day. Will update as appropriate.   Pt currently with functional limitations due to the deficits listed below (see PT Problem List). Pt will benefit from acute skilled PT to increase their independence and safety with mobility to allow discharge.           If plan is discharge home, recommend the following: A little help with walking and/or transfers;A little help with bathing/dressing/bathroom;Assistance with cooking/housework;Supervision due to cognitive status   Can travel by private vehicle        Equipment Recommendations None recommended by PT  Recommendations for Other Services        Functional Status Assessment Patient has had a recent decline in their functional status and demonstrates the ability to make significant improvements in function in a reasonable and predictable amount of time.     Precautions / Restrictions Precautions Precautions: Fall Recall of Precautions/Restrictions: Impaired Precaution/Restrictions Comments: monitor O2 Restrictions Weight Bearing Restrictions Per Provider Order: No      Mobility  Bed Mobility Overal bed mobility: Modified Independent             General bed mobility comments: extra time no assist.    Transfers Overall transfer level: Needs assistance Equipment used: Rolling walker (2 wheels) Transfers: Sit to/from Stand Sit to Stand: Supervision           General transfer comment: Supervision for safety rising from bed to stand with light UE support with Rw.    Ambulation/Gait Ambulation/Gait assistance: Contact guard assist Gait Distance (Feet): 110 Feet Assistive device: Rolling walker (2 wheels) Gait Pattern/deviations: Step-through pattern, Decreased stride length, Drifts right/left, Narrow base of support, Leaning posteriorly Gait velocity: de Gait velocity interpretation: <1.31 ft/sec, indicative of household ambulator   General Gait Details: Educated on safe AD use with RW to maximize support. Demonstrates intermittent drift, slight posterior lean at times. Cues for awareness and techniques to correct. CGA for safety but able to self correct throughout. Encouraged RW use at all times for safety. SpO2 did drop to 87% on RA, recovered quickly after sitting for rest. Mild dyspnea.  Stairs            Wheelchair Mobility     Tilt Bed    Modified Rankin (Stroke Patients Only)  Balance Overall balance assessment: Needs assistance Sitting-balance support: No upper extremity supported, Feet supported Sitting balance-Leahy Scale: Good     Standing balance support: No upper extremity  supported, During functional activity Standing balance-Leahy Scale: Fair Standing balance comment: More stable with device                 Standardized Balance Assessment Standardized Balance Assessment : Berg Balance Test Berg Balance Test Sit to Stand: Able to stand  independently using hands Standing Unsupported: Able to stand safely 2 minutes Sitting with Back Unsupported but Feet Supported on Floor or Stool: Able to sit safely and securely 2 minutes Stand to Sit: Controls descent by using hands Transfers: Able to transfer safely, definite need of hands Standing Unsupported with Eyes Closed: Able to stand 10 seconds with supervision Standing Ubsupported with Feet Together: Needs help to attain position but able to stand for 30 seconds with feet together From Standing, Reach Forward with Outstretched Arm: Can reach forward >12 cm safely (5) From Standing Position, Pick up Object from Floor: Unable to pick up shoe, but reaches 2-5 cm (1-2) from shoe and balances independently From Standing Position, Turn to Look Behind Over each Shoulder: Looks behind one side only/other side shows less weight shift Turn 360 Degrees: Able to turn 360 degrees safely but slowly Standing Unsupported, Alternately Place Feet on Step/Stool: Able to complete >2 steps/needs minimal assist Standing Unsupported, One Foot in Front: Needs help to step but can hold 15 seconds Standing on One Leg: Tries to lift leg/unable to hold 3 seconds but remains standing independently Total Score: 34         Pertinent Vitals/Pain Pain Assessment Pain Assessment: No/denies pain    Home Living Family/patient expects to be discharged to:: Private residence Living Arrangements: Alone Available Help at Discharge: Family;Friend(s);Available 24 hours/day Type of Home: Other(Comment) (ALF [facility]) Home Access: Level entry       Home Layout: One level Home Equipment: Agricultural consultant (2 wheels);Cane - single  point;Shower seat      Prior Function Prior Level of Function : Independent/Modified Independent             Mobility Comments: ind, uses SPC and RW intermittently as needed. Denies falls ADLs Comments: ind, does her own bathing/dressing - dining hall for meals.     Extremity/Trunk Assessment   Upper Extremity Assessment Upper Extremity Assessment: Defer to OT evaluation;Right hand dominant    Lower Extremity Assessment Lower Extremity Assessment: Generalized weakness       Communication   Communication Communication: No apparent difficulties    Cognition Arousal: Alert Behavior During Therapy: WFL for tasks assessed/performed   PT - Cognitive impairments: Orientation   Orientation impairments: Time, Situation                   PT - Cognition Comments: Incorrect year (2054 stated); looked at calandar on wall before providing month. Following commands: Intact       Cueing Cueing Techniques: Verbal cues     General Comments General comments (skin integrity, edema, etc.): SpO2 95% at rest, 87% with gait.    Exercises     Assessment/Plan    PT Assessment Patient needs continued PT services  PT Problem List Decreased strength;Decreased activity tolerance;Decreased balance;Decreased mobility;Decreased coordination;Decreased cognition;Decreased knowledge of use of DME;Decreased safety awareness;Decreased knowledge of precautions;Cardiopulmonary status limiting activity       PT Treatment Interventions DME instruction;Gait training;Functional mobility training;Therapeutic activities;Therapeutic exercise;Balance training;Neuromuscular re-education;Cognitive remediation;Patient/family education  PT Goals (Current goals can be found in the Care Plan section)  Acute Rehab PT Goals Patient Stated Goal: Get well PT Goal Formulation: With patient Time For Goal Achievement: 05/27/24 Potential to Achieve Goals: Good Additional Goals Additional Goal #1:  Improve BERG by 12 points to indicate an reduced level of fall risk of 46 or greater.    Frequency Min 2X/week     Co-evaluation               AM-PAC PT 6 Clicks Mobility  Outcome Measure Help needed turning from your back to your side while in a flat bed without using bedrails?: None Help needed moving from lying on your back to sitting on the side of a flat bed without using bedrails?: None Help needed moving to and from a bed to a chair (including a wheelchair)?: A Little Help needed standing up from a chair using your arms (e.g., wheelchair or bedside chair)?: A Little Help needed to walk in hospital room?: A Little Help needed climbing 3-5 steps with a railing? : A Lot 6 Click Score: 19    End of Session Equipment Utilized During Treatment: Gait belt Activity Tolerance: Patient tolerated treatment well Patient left: in bed;with call bell/phone within reach;with bed alarm set;with family/visitor present Nurse Communication: Mobility status (SpO2 87% ambulating) PT Visit Diagnosis: Unsteadiness on feet (R26.81);Other abnormalities of gait and mobility (R26.89);Muscle weakness (generalized) (M62.81);Difficulty in walking, not elsewhere classified (R26.2)    Time: 8587-8557 PT Time Calculation (min) (ACUTE ONLY): 30 min   Charges:   PT Evaluation $PT Eval Low Complexity: 1 Low PT Treatments $Gait Training: 8-22 mins PT General Charges $$ ACUTE PT VISIT: 1 Visit         Leontine Roads, PT, DPT [facility]  Rehabilitation Services Physical Therapist Office: [phone] Website: [email or web address]   Leontine GORMAN Roads 05/13/2024, 4:02 PM

## 2024-05-13 NOTE — Progress Notes (Signed)
 Progress Note   Patient: Amber Barnett FMW:[id number] DOB: May 05, 1934 DOA: 05/11/2024     2 DOS: the patient was seen and examined on 05/13/2024   Brief hospital course: Amber Barnett is a 89 y.o. female with medical history significant of Afib, Hypertension, CVA, and DVT presented with shortness of breath, high blood pressure. Chest xray showed pleural effusion, bilateral patchy opacities. Admitted for flash pulmonary edema due to HFpEF exacerbation (EF 60-65% in 12/2023) and multifocal CAP.   Assessment and Plan: Acute on chronic diastolic heart failure Pulmonary edema- Symptoms improved. Will stop IV Lasix  and resume home dose lasix  20mg  q48hr. Continue home dose Cardizem , imdur . Monitor daily weights, strict input and output. Fluid restriction, CHF instruction provided.  Multifocal pneumonia- Bilateral patchy infiltrates on chest xray consistent with pneumonia. Continue Rocephin  and azithromycin . Continue antitussives. Out of bed to chair, incentive spirometry.  AKI on CKD stage 3A- In the setting of diuresis. Will stop IV Lasix . Continue to monitor daily renal function.  Hypertension- BP better controlled, after resuming imdur , Cardizem .  Paroxysmal Afib- Resumed Eliquis . Continue telemetry.  At risk for malnutrition- BMI 17.78. Diet education provided.    Continue home dose mirtazapine , memantine , and donepezil   Out of bed to chair. Incentive spirometry. Nursing supportive care. Fall, aspiration precautions. Diet:  Diet Orders (From admission, onward)     Start     Ordered   05/11/24 1536  Diet regular Room service appropriate? Yes; Fluid consistency: Thin  Diet effective now       Question Answer Comment  Room service appropriate? Yes   Fluid consistency: Thin      05/11/24 1535           DVT prophylaxis: apixaban  (ELIQUIS ) tablet 2.5 mg Start: 05/11/24 2200 apixaban  (ELIQUIS ) tablet 2.5 mg  Level of care: Telemetry Cardiac    Code Status: Do not attempt resuscitation (DNR) PRE-ARREST INTERVENTIONS DESIRED  Subjective: Patient is seen and examined today morning. She is lying in bed. Feels weak. Eating fair. Family member sick, states no one to take her.  Physical Exam: Vitals:   05/13/24 0742 05/13/24 0836 05/13/24 0920 05/13/24 1143  BP:   (!) 164/87 (!) 157/64  Pulse:  79 78   Resp: (!) 22 (!) 21  20  Temp: 98.5 F (36.9 C)   97.9 F (36.6 C)  TempSrc: Oral   Oral  SpO2:  96% 91%   Weight:      Height:        General - Elderly African American female, no apparent distress HEENT - PERRLA, EOMI, atraumatic head, non tender sinuses. Lung - Clear, basal rales, rhonchi, wheezes. Heart - S1, S2 heard, no murmurs, rubs, trace pedal edema. Abdomen - Soft, non tender, bowel sounds good Neuro - Alert, awake and oriented x 3, non focal exam. Skin - Warm and dry.  Data Reviewed:      Latest Ref Rng & Units 05/11/2024    9:26 AM 01/05/2024    2:31 AM 01/04/2024   12:30 AM  CBC  WBC 4.0 - 10.5 K/uL 7.8  5.5  7.0   Hemoglobin 12.0 - 15.0 g/dL 88.3  88.4  88.8   Hematocrit 36.0 - 46.0 % 36.7  32.3  33.6   Platelets 150 - 400 K/uL 198  209  179       Latest Ref Rng & Units 05/13/2024    2:13 AM 05/11/2024    9:26 AM 01/05/2024    2:31 AM  BMP  Glucose  70 - 99 mg/dL 85  885  82   BUN 8 - 23 mg/dL 25  10  11    Creatinine 0.44 - 1.00 mg/dL 8.60  8.81  8.90   Sodium 135 - 145 mmol/L 138  140  138   Potassium 3.5 - 5.1 mmol/L 3.5  4.1  3.4   Chloride 98 - 111 mmol/L 100  105  102   CO2 22 - 32 mmol/L 27  21  23    Calcium  8.9 - 10.3 mg/dL 8.4  8.6  8.5    No results found.   Family Communication: Discussed with patient, understand and agree. All questions answered.  Disposition: Status is: Inpatient Remains inpatient appropriate because: diuresis, PT/ OT.  Planned Discharge Destination: Home with Home Health     Time spent: 44 minutes  Author: Concepcion Riser, MD 05/13/2024 1:48 PM Secure  chat 7am to 7pm For on call review [email or web address].

## 2024-05-13 NOTE — Progress Notes (Signed)
 Mobility Specialist Progress Note:   05/13/24 0930  Mobility  Activity Ambulated with assistance  Level of Assistance Contact guard assist, steadying assist  Assistive Device Front wheel walker  Distance Ambulated (ft) 100 ft  Activity Response Tolerated well  Mobility Referral Yes  Mobility visit 1 Mobility  Mobility Specialist Start Time (ACUTE ONLY) 0930  Mobility Specialist Stop Time (ACUTE ONLY) 0945  Mobility Specialist Time Calculation (min) (ACUTE ONLY) 15 min   Pt agreeable to mobility session. Required steadying assist throughout ambulation with RW use. Needed cues d/t scissoring gait, able to correct. No overt LOB noted. SpO2 WFL on RA. Pt back in bed with all needs met.   Therisa Rana Mobility Specialist Please contact via SecureChat or  Rehab office at [phone]

## 2024-05-14 DIAGNOSIS — I10 Essential (primary) hypertension: Secondary | ICD-10-CM | POA: Diagnosis not present

## 2024-05-14 DIAGNOSIS — I5033 Acute on chronic diastolic (congestive) heart failure: Secondary | ICD-10-CM | POA: Diagnosis not present

## 2024-05-14 DIAGNOSIS — J189 Pneumonia, unspecified organism: Secondary | ICD-10-CM

## 2024-05-14 DIAGNOSIS — I4821 Permanent atrial fibrillation: Secondary | ICD-10-CM

## 2024-05-14 DIAGNOSIS — I272 Pulmonary hypertension, unspecified: Secondary | ICD-10-CM

## 2024-05-14 LAB — BASIC METABOLIC PANEL WITH GFR
Anion gap: 11 (ref 5–15)
BUN: 20 mg/dL (ref 8–23)
CO2: 25 mmol/L (ref 22–32)
Calcium: 8.2 mg/dL — ABNORMAL LOW (ref 8.9–10.3)
Chloride: 104 mmol/L (ref 98–111)
Creatinine, Ser: 1.23 mg/dL — ABNORMAL HIGH (ref 0.44–1.00)
GFR, Estimated: 43 mL/min — ABNORMAL LOW (ref >60)
Glucose, Bld: 82 mg/dL (ref 70–99)
Potassium: 2.9 mmol/L — ABNORMAL LOW (ref 3.5–5.1)
Sodium: 140 mmol/L (ref 135–145)

## 2024-05-14 MED ORDER — FUROSEMIDE 20 MG PO TABS: 20.0000 mg | ORAL_TABLET | ORAL | Status: AC

## 2024-05-14 MED ORDER — DOXYCYCLINE HYCLATE 100 MG PO TABS: 100.0000 mg | ORAL_TABLET | Freq: Two times a day (BID) | ORAL | 0 refills | Status: AC

## 2024-05-14 MED ORDER — POTASSIUM CHLORIDE CRYS ER 20 MEQ PO TBCR
40.0000 meq | EXTENDED_RELEASE_TABLET | Freq: Two times a day (BID) | ORAL | Status: DC
  Administered 2024-05-14: 40 meq via ORAL
  Filled 2024-05-14: qty 2

## 2024-05-14 NOTE — Progress Notes (Signed)
 OT treatment   05/14/24 1224  OT Visit Information  Last OT Received On 05/14/24  Assistance Needed +1  History of Present Illness 89 y.o. female presented 9/21 with shortness of breath, high blood pressure. Chest xray showed pleural effusion, bilateral patchy opacities. Admitted for flash pulmonary edema due to HFpEF exacerbation (EF 60-65% in 12/2023) and multifocal CAP.Chest xray showed pleural effusion, bilateral patchy opacities. Admitted for flash pulmonary edema due to HFpEF exacerbation (EF 60-65% in 12/2023) and multifocal CAP. with medical history significant of Afib, Hypertension, CVA, and DVT.  Precautions  Precautions Fall  Recall of Precautions/Restrictions Impaired  Precaution/Restrictions Comments monitor O2  Restrictions  Weight Bearing Restrictions Per Provider Order No  Pain Assessment  Pain Assessment No/denies pain  Cognition  Arousal Alert  Behavior During Therapy WFL for tasks assessed/performed  Cognition Cognition impaired  OT - Cognition Comments voiding bladder without pulling down underwear ( mesh hospital underwear)  Following Commands  Following commands Intact  Cueing  Cueing Techniques Verbal cues  Upper Extremity Assessment  Upper Extremity Assessment Generalized weakness  Lower Extremity Assessment  Lower Extremity Assessment Generalized weakness  ADL  Overall ADL's  Needs assistance/impaired  Toilet Transfer Supervision/safety;Stand-pivot;BSC/3in1  Toileting- Clothing Manipulation and Hygiene Minimal assistance  Toileting - Clothing Manipulation Details (indicate cue type and reason) pt requires (A) to don underwear and not aware that she voided with panties and pad still in place  General ADL Comments pt calling out to staff in hall instead of pushing the button that is in her hand  Bed Mobility  General bed mobility comments oob on arrival  Transfers  Overall transfer level Needs assistance  Equipment used Rolling walker (2 wheels)  Transfers  Sit to/from Stand  Sit to Stand Supervision  OT - End of Session  Equipment Utilized During Treatment Rolling walker (2 wheels)  Activity Tolerance Patient tolerated treatment well  Patient left in chair;with call bell/phone within reach;with chair alarm set  Nurse Communication Mobility status;Precautions  OT Assessment/Plan  OT Visit Diagnosis Unsteadiness on feet (R26.81);Muscle weakness (generalized) (M62.81)  OT Frequency (ACUTE ONLY) Min 2X/week  Follow Up Recommendations Home health OT  Patient can return home with the following A little help with bathing/dressing/bathroom  OT Equipment None recommended by OT  AM-PAC OT 6 Clicks Daily Activity Outcome Measure (Version 2)  Help from another person eating meals? 4  Help from another person taking care of personal grooming? 4  Help from another person toileting, which includes using toliet, bedpan, or urinal? 4  Help from another person bathing (including washing, rinsing, drying)? 4  Help from another person to put on and taking off regular upper body clothing? 4  Help from another person to put on and taking off regular lower body clothing? 4  6 Click Score 24  Progressive Mobility  What is the highest level of mobility based on the mobility assessment? Level 4 (Ambulates with assistance) - Balance while stepping forward/back - Complete  Mobility Referral Yes  Activity Stood at bedside  Acute Rehab OT Goals  OT Goal Formulation With patient  Time For Goal Achievement 05/28/24  Potential to Achieve Goals Good  OT Time Calculation  OT Start Time (ACUTE ONLY) 1145  OT Stop Time (ACUTE ONLY) 1155  OT Time Calculation (min) 10 min  OT General Charges  $OT Visit 1 Visit  OT Treatments  $Self Care/Home Management  8-22 mins    Brynn, OTR/L  Acute Rehabilitation Services Office: [phone] .

## 2024-05-14 NOTE — Progress Notes (Signed)
 SATURATION QUALIFICATIONS: (This note is used to comply with regulatory documentation for home oxygen )  Patient Saturations on Room Air at Rest = 95%  Patient Saturations on Room Air while Ambulating = 90%    Please briefly explain why patient needs home oxygen : Pt was able to maintain sats >/= 90% on RA throughout.   Theo Ferretti, PT, DPT Acute Rehabilitation Services  Office: [phone]

## 2024-05-14 NOTE — TOC Progression Note (Signed)
 Transition of Care [facility]) - Progression Note    Patient Details  Name: Amber Barnett MRN: [id number] Date of Birth: 01-Mar-1934  Transition of Care [facility]) CM/SW Contact  Luise JAYSON Pan, [place] Phone Number: 05/14/2024, 11:03 AM  Clinical Narrative:   CSW spoke with Renda at [facility] about patients potential discharge today per EDD. Renda stated patient can return and she will need DC summary, cosigned FL2 and HH order. CSW notified MD.   CSW will continue to follow.    Expected Discharge Plan: Assisted Living Barriers to Discharge: Continued Medical Work up               Expected Discharge Plan and Services In-house Referral: Clinical Social Work     Living arrangements for the past 2 months: Assisted Living Facility                                       Social Drivers of Health (SDOH) Interventions SDOH Screenings   Food Insecurity: No Food Insecurity (05/11/2024)  Housing: Low Risk  (05/11/2024)  Transportation Needs: No Transportation Needs (05/11/2024)  Utilities: Not At Risk (05/11/2024)  Financial Resource Strain: Low Risk  (08/22/2022)   Received from [facility]  Social Connections: Moderately Isolated (05/11/2024)  Stress: No Stress Concern Present (06/27/2023)   Received from [facility]  Tobacco Use: Medium Risk (05/11/2024)    Readmission Risk Interventions     No data to display

## 2024-05-14 NOTE — TOC Transition Note (Signed)
 Transition of Care [facility]) - Discharge Note   Patient Details  Name: Amber Barnett MRN: [id number] Date of Birth: 09-23-1933  Transition of Care [facility]) CM/SW Contact:  Luise JAYSON Pan, LCSWA Phone Number: 05/14/2024, 3:16 PM   Clinical Narrative:   Patient will DC to: [facility] Anticipated DC date: 05/14/24  Family notified: Delgrachia (Niece)  Transport ab:[email or web address] (Niece)    Per MD patient ready for DC to [facility]. RN to call report prior to discharge [phone] or main line [phone]). RN, patient, patient's family, and facility notified of DC. Discharge Summary and FL2 sent to facility.   CSW will sign off for now as social work intervention is no longer needed. Please consult us  again if new needs arise.  Final next level of care: Assisted Living Barriers to Discharge: Barriers Resolved   Patient Goals and CMS Choice Patient states their goals for this hospitalization and ongoing recovery are:: To return to ALF          Discharge Placement              Patient chooses bed at: Other - please specify in the comment section below: ([facility]) Patient to be transferred to facility by: Delgrachia (Niece) Name of family member notified: Delgrachia (Niece) Patient and family notified of of transfer: 05/14/24  Discharge Plan and Services Additional resources added to the After Visit Summary for   In-house Referral: Clinical Social Work                                   Social Drivers of Health (SDOH) Interventions SDOH Screenings   Food Insecurity: No Food Insecurity (05/11/2024)  Housing: Low Risk  (05/11/2024)  Transportation Needs: No Transportation Needs (05/11/2024)  Utilities: Not At Risk (05/11/2024)  Financial Resource Strain: Low Risk  (08/22/2022)   Received from [facility]  Social Connections: Moderately Isolated (05/11/2024)  Stress: No Stress Concern Present (06/27/2023)   Received from  [facility]  Tobacco Use: Medium Risk (05/11/2024)     Readmission Risk Interventions     No data to display

## 2024-05-14 NOTE — Progress Notes (Signed)
 Physical Therapy Treatment Patient Details Name: Amber Barnett MRN: [id number] DOB: [birth year] Today's Date: 05/14/2024   History of Present Illness 89 y.o. female presented 9/21 with shortness of breath, high blood pressure. Chest xray showed pleural effusion, bilateral patchy opacities. Admitted for flash pulmonary edema due to HFpEF exacerbation (EF 60-65% in 12/2023) and multifocal CAP.Chest xray showed pleural effusion, bilateral patchy opacities. Admitted for flash pulmonary edema due to HFpEF exacerbation (EF 60-65% in 12/2023) and multifocal CAP. with medical history significant of Afib, Hypertension, CVA, and DVT.    PT Comments  The pt is making good progress, ambulating with improved stability while utilizing a RW and maintaining her sats >/= 90% on RA throughout the session. Encouraged pt to continue to mobilize frequently to improve her endurance as she did stop to stand and rest several times while ambulating, reporting fatigue. Will continue to follow acutely.    If plan is discharge home, recommend the following: A little help with walking and/or transfers;A little help with bathing/dressing/bathroom;Assistance with cooking/housework;Supervision due to cognitive status   Can travel by private vehicle        Equipment Recommendations  None recommended by PT    Recommendations for Other Services       Precautions / Restrictions Precautions Precautions: Fall Recall of Precautions/Restrictions: Impaired Precaution/Restrictions Comments: monitor O2 Restrictions Weight Bearing Restrictions Per Provider Order: No     Mobility  Bed Mobility               General bed mobility comments: Pt sitting EOB upon arrival and sitting in recliner end of session.    Transfers Overall transfer level: Needs assistance Equipment used: Rolling walker (2 wheels) Transfers: Sit to/from Stand Sit to Stand: Supervision           General transfer comment: Supervision  for safety rising from bed to stand, no LOB    Ambulation/Gait Ambulation/Gait assistance: Supervision Gait Distance (Feet): 120 Feet Assistive device: Rolling walker (2 wheels) Gait Pattern/deviations: Step-through pattern, Decreased stride length, Narrow base of support Gait velocity: reduced Gait velocity interpretation: <1.31 ft/sec, indicative of household ambulator   General Gait Details: Pt with continued narrow stance, but no posterior lean or lateral drifting today. Pt did stop to stand and rest several times, and reported feeling like she was a little off balance several times which resulted in her stopping to rest and regain balance before continuing. No LOB, supervision for safety.   Stairs             Wheelchair Mobility     Tilt Bed    Modified Rankin (Stroke Patients Only)       Balance Overall balance assessment: Needs assistance Sitting-balance support: No upper extremity supported, Feet supported Sitting balance-Leahy Scale: Good     Standing balance support: During functional activity, Bilateral upper extremity supported, Reliant on assistive device for balance Standing balance-Leahy Scale: Poor Standing balance comment: reliant on RW to ambulate                            Communication Communication Communication: No apparent difficulties  Cognition Arousal: Alert Behavior During Therapy: WFL for tasks assessed/performed                           PT - Cognition Comments: Pt with questionable memory deficits, but overall followed cues well. Self aware of her fatigue and need to rest as needed  Following commands: Intact      Cueing Cueing Techniques: Verbal cues  Exercises      General Comments General comments (skin integrity, edema, etc.): SpO2 >/= 90% on RA throughout      Pertinent Vitals/Pain Pain Assessment Pain Assessment: Faces Faces Pain Scale: No hurt Pain Intervention(s): Monitored during session     Home Living                          Prior Function            PT Goals (current goals can now be found in the care plan section) Acute Rehab PT Goals Patient Stated Goal: Get well PT Goal Formulation: With patient Time For Goal Achievement: 05/27/24 Potential to Achieve Goals: Good Progress towards PT goals: Progressing toward goals    Frequency    Min 2X/week      PT Plan      Co-evaluation              AM-PAC PT 6 Clicks Mobility   Outcome Measure  Help needed turning from your back to your side while in a flat bed without using bedrails?: None Help needed moving from lying on your back to sitting on the side of a flat bed without using bedrails?: None Help needed moving to and from a bed to a chair (including a wheelchair)?: A Little Help needed standing up from a chair using your arms (e.g., wheelchair or bedside chair)?: A Little Help needed to walk in hospital room?: A Little Help needed climbing 3-5 steps with a railing? : A Lot 6 Click Score: 19    End of Session Equipment Utilized During Treatment: Gait belt Activity Tolerance: Patient tolerated treatment well Patient left: with call bell/phone within reach;in chair;with chair alarm set   PT Visit Diagnosis: Unsteadiness on feet (R26.81);Other abnormalities of gait and mobility (R26.89);Muscle weakness (generalized) (M62.81);Difficulty in walking, not elsewhere classified (R26.2)     Time: 9169-9156 PT Time Calculation (min) (ACUTE ONLY): 13 min  Charges:    $Gait Training: 8-22 mins PT General Charges $$ ACUTE PT VISIT: 1 Visit                     Theo Ferretti, PT, DPT Acute Rehabilitation Services  Office: [phone]    Theo CHRISTELLA Ferretti 05/14/2024, 8:50 AM

## 2024-05-14 NOTE — Discharge Summary (Signed)
 Physician Discharge Summary   Patient: Amber Barnett MRN: [id number] DOB: 12/05/1933  Admit date:     05/11/2024  Discharge date: 05/14/24  Discharge Physician: Concepcion Riser   PCP: Prescilla Golas, PA-C   Recommendations at discharge:    PCP follow up in 1 week. Chest xray 2 view in 3-4 weeks for pneumonia resolution.  Discharge Diagnoses: Principal Problem:   Acute on chronic diastolic CHF (congestive heart failure) (HCC) Active Problems:   Multifocal pneumonia   Essential hypertension   Permanent atrial fibrillation (HCC)   Pulmonary hypertension (HCC)  Resolved Problems:   * No resolved hospital problems. *  Hospital Course: Amber Barnett is a 89 y.o. female with medical history significant of Afib, Hypertension, CVA, and DVT presented with shortness of breath, high blood pressure. Chest xray showed pleural effusion, bilateral patchy opacities. Admitted for flash pulmonary edema due to HFpEF exacerbation (EF 60-65% in 12/2023) and multifocal CAP.    Assessment and Plan: Acute on chronic diastolic heart failure Pulmonary edema- Symptoms improved with IV lasix  40 mg bid. IV diuretic changed to Lasix  20mg  q48hr due to renal impairment. Continue home dose Cardizem , imdur . Monitor daily weights, strict input and output. Fluid restriction, CHF instructions provided. Outpatient PCP follow up suggested.   Multifocal pneumonia- Bilateral patchy infiltrates on chest xray consistent with pneumonia. Completed 3 days of Rocephin  and azithromycin . Antibiotic changed to doxycycline  for 4 days. Encouraged out of bed to chair, incentive spirometry. Outpatient follow up xray in 3-6 weeks for resolution of pneumonia.   AKI on CKD stage 3A- In the setting of diuresis. Lasix  dose changed to every other day upon discharge. Continue to monitor renal function as outpatient with PCP.   Hypertension- Resumed imdur , Cardizem .   Paroxysmal Afib- Resumed  Cardizem , Eliquis .   At risk for malnutrition- BMI 17.78. Diet education provided.        Consultants: none Procedures performed: none  Disposition: Assisted living Diet recommendation:  Discharge Diet Orders (From admission, onward)     Start     Ordered   05/14/24 0000  Diet - low sodium heart healthy        05/14/24 1220           Cardiac diet DISCHARGE MEDICATION: Allergies as of 05/14/2024       Reactions   Fish-derived Products Swelling   Facial swelling then tongue swelling   Milk-related Compounds Diarrhea   WHOLE MILK   Other Swelling, Diarrhea   Allergy to all nuts - facial swelling, then tongue swelling WHOLE MILK   Wild Lettuce Extract (lactuca Virosa) Diarrhea   Amoxicillin Nausea And Vomiting        Medication List     STOP taking these medications    latanoprost  0.005 % ophthalmic solution Commonly known as: XALATAN    rosuvastatin  5 MG tablet Commonly known as: CRESTOR        TAKE these medications    acetaminophen  325 MG tablet Commonly known as: TYLENOL  Take 650 mg by mouth every 8 (eight) hours as needed for mild pain (pain score 1-3) or headache.   apixaban  2.5 MG Tabs tablet Commonly known as: ELIQUIS  Take by mouth 2 (two) times daily.   diltiazem  120 MG 24 hr capsule Commonly known as: TIAZAC  Take 120 mg by mouth daily.   donepezil  5 MG tablet Commonly known as: ARICEPT  Take 5 mg by mouth at bedtime.   doxycycline  100 MG tablet Commonly known as: VIBRA -TABS Take 1 tablet (100 mg total) by mouth  2 (two) times daily for 4 days.   famotidine  20 MG tablet Commonly known as: PEPCID  Take 20 mg by mouth at bedtime.   fluticasone -salmeterol 100-50 MCG/ACT Aepb Commonly known as: ADVAIR Inhale 1 puff into the lungs 2 (two) times daily.   furosemide  20 MG tablet Commonly known as: Lasix  Take 1 tablet (20 mg total) by mouth every Monday, Wednesday, and Friday. What changed: when to take this   isosorbide  mononitrate  30 MG 24 hr tablet Commonly known as: IMDUR  Take 1 tablet (30 mg total) by mouth daily.   MAGnesium -Oxide 400 (241.3 Mg) MG tablet Generic drug: magnesium  oxide Take 400 mg by mouth 2 (two) times daily.   memantine  10 MG tablet Commonly known as: NAMENDA  Take 10 mg by mouth 2 (two) times daily.   mirtazapine  15 MG tablet Commonly known as: REMERON  Take 15 mg by mouth at bedtime.   pantoprazole  40 MG tablet Commonly known as: PROTONIX  Take 40 mg by mouth daily.   sertraline 25 MG tablet Commonly known as: ZOLOFT Take 25 mg by mouth at bedtime.   UNABLE TO FIND Take 1 Container by mouth 2 (two) times daily. Magic Cup        Discharge Exam: Filed Weights   05/11/24 0911 05/11/24 1449 05/12/24 0320  Weight: 54 kg 52.4 kg 51.8 kg      05/14/2024   12:03 PM 05/14/2024    8:23 AM 05/14/2024    7:25 AM  Vitals with BMI  Systolic 169  187  Diastolic 82  88  Pulse 88 64 64   General - Elderly African American female, no apparent distress HEENT - PERRLA, EOMI, atraumatic head, non tender sinuses. Lung - Clear, basal rales, no rhonchi, wheezes. Heart - S1, S2 heard, no murmurs, rubs, trace pedal edema. Abdomen - Soft, non tender, bowel sounds good Neuro - Alert, awake and oriented x 3, non focal exam. Skin - Warm and dry.  Condition at discharge: stable  The results of significant diagnostics from this hospitalization (including imaging, microbiology, ancillary and laboratory) are listed below for reference.   Imaging Studies: DG Chest Port 1 View Result Date: 05/11/2024 CLINICAL DATA:  dyspnea EXAM: PORTABLE CHEST 1 VIEW COMPARISON:  CT angiography chest 01/04/2024, chest x-ray 04/03/2024 FINDINGS: The heart and mediastinal contours are unchanged. Atherosclerotic plaque. Bilateral lower lung zone patchy airspace opacities. No pulmonary edema. Interval development of bilateral trace pleural effusion. No pneumothorax. No acute osseous abnormality. IMPRESSION: 1. Bilateral  lower lung zone patchy airspace opacities. Interval development of bilateral trace pleural effusion. Findings suggestive of multifocal pneumonia. Followup PA and lateral chest X-ray is recommended in 3-4 weeks following therapy to ensure resolution and exclude underlying malignancy. 2.  Aortic Atherosclerosis (ICD10-I70.0). Electronically Signed   By: Morgane  Naveau M.D.   On: 05/11/2024 10:12    Microbiology: Results for orders placed or performed during the hospital encounter of 11/28/22  Resp panel by RT-PCR (RSV, Flu A&B, Covid) Anterior Nasal Swab     Status: None   Collection Time: 11/28/22  4:19 PM   Specimen: Anterior Nasal Swab  Result Value Ref Range Status   SARS Coronavirus 2 by RT PCR NEGATIVE NEGATIVE Final   Influenza A by PCR NEGATIVE NEGATIVE Final   Influenza B by PCR NEGATIVE NEGATIVE Final    Comment: (NOTE) The Xpert Xpress SARS-CoV-2/FLU/RSV plus assay is intended as an aid in the diagnosis of influenza from Nasopharyngeal swab specimens and should not be used as a sole basis for  treatment. Nasal washings and aspirates are unacceptable for Xpert Xpress SARS-CoV-2/FLU/RSV testing.  Fact Sheet for Patients: BloggerCourse.com  Fact Sheet for Healthcare Providers: SeriousBroker.it  This test is not yet approved or cleared by the [place]  FDA and has been authorized for detection and/or diagnosis of SARS-CoV-2 by FDA under an Emergency Use Authorization (EUA). This EUA will remain in effect (meaning this test can be used) for the duration of the COVID-19 declaration under Section 564(b)(1) of the Act, 21 U.S.C. section 360bbb-3(b)(1), unless the authorization is terminated or revoked.     Resp Syncytial Virus by PCR NEGATIVE NEGATIVE Final    Comment: (NOTE) Fact Sheet for Patients: BloggerCourse.com  Fact Sheet for Healthcare  Providers: SeriousBroker.it  This test is not yet approved or cleared by the [place]  FDA and has been authorized for detection and/or diagnosis of SARS-CoV-2 by FDA under an Emergency Use Authorization (EUA). This EUA will remain in effect (meaning this test can be used) for the duration of the COVID-19 declaration under Section 564(b)(1) of the Act, 21 U.S.C. section 360bbb-3(b)(1), unless the authorization is terminated or revoked.  Performed at [facility], [address], [place], [place] [address]     Labs: CBC: Recent Labs  Lab 05/11/24 0926  WBC 7.8  HGB 11.6*  HCT 36.7  MCV 82.8  PLT 198   Basic Metabolic Panel: Recent Labs  Lab 05/11/24 0926 05/13/24 0213 05/14/24 0346  NA 140 138 140  K 4.1 3.5 2.9*  CL 105 100 104  CO2 21* 27 25  GLUCOSE 114* 85 82  BUN 10 25* 20  CREATININE 1.18* 1.39* 1.23*  CALCIUM  8.6* 8.4* 8.2*   Liver Function Tests: Recent Labs  Lab 05/11/24 0926  AST 31  ALT 16  ALKPHOS 67  BILITOT 1.1  PROT 7.3  ALBUMIN 3.9   CBG: No results for input(s): GLUCAP in the last 168 hours.  Discharge time spent: 34 minutes.  Signed: Concepcion Riser, MD [facility] 05/14/2024

## 2024-05-14 NOTE — Evaluation (Signed)
 Occupational Therapy Evaluation Patient Details Name: Amber Barnett MRN: [id number] DOB: [birth year] Today's Date: 05/14/2024   History of Present Illness   89 y.o. female presented 9/21 with shortness of breath, high blood pressure. Chest xray showed pleural effusion, bilateral patchy opacities. Admitted for flash pulmonary edema due to HFpEF exacerbation (EF 60-65% in 12/2023) and multifocal CAP.Chest xray showed pleural effusion, bilateral patchy opacities. Admitted for flash pulmonary edema due to HFpEF exacerbation (EF 60-65% in 12/2023) and multifocal CAP. with medical history significant of Afib, Hypertension, CVA, and DVT.     Clinical Impressions PT admitted with SOB and elevated BP. Pt currently with functional limitiations due to the deficits listed below (see OT problem list). Pt lives alone in ALF mod I. Pt with RA >90 and HR 100 max during session. Pt does require  x3 rest breaks with transfers.  Pt will benefit from skilled OT to increase their independence and safety with adls and balance to allow discharge HHOT.      If plan is discharge home, recommend the following:   A little help with bathing/dressing/bathroom     Functional Status Assessment   Patient has had a recent decline in their functional status and demonstrates the ability to make significant improvements in function in a reasonable and predictable amount of time.     Equipment Recommendations   None recommended by OT     Recommendations for Other Services         Precautions/Restrictions   Precautions Precautions: Fall Recall of Precautions/Restrictions: Impaired Precaution/Restrictions Comments: monitor O2     Mobility Bed Mobility               General bed mobility comments: oobbo n arriavl    Transfers Overall transfer level: Needs assistance Equipment used: Rolling walker (2 wheels) Transfers: Sit to/from Stand Sit to Stand: Supervision                   Balance           Standing balance support: Bilateral upper extremity supported, During functional activity, Reliant on assistive device for balance Standing balance-Leahy Scale: Poor                             ADL either performed or assessed with clinical judgement   ADL Overall ADL's : Needs assistance/impaired Eating/Feeding: Modified independent   Grooming: Wash/dry face;Supervision/safety Grooming Details (indicate cue type and reason): combing hair and standing at sink level grooming task             Lower Body Dressing: Supervision/safety               Functional mobility during ADLs: Contact guard assist;Rolling walker (2 wheels) General ADL Comments: cues to not lift the RW. the RW was rubbing against teh ground which could be the reason     Vision         Perception         Praxis         Pertinent Vitals/Pain Pain Assessment Pain Assessment: No/denies pain     Extremity/Trunk Assessment Upper Extremity Assessment Upper Extremity Assessment: Generalized weakness   Lower Extremity Assessment Lower Extremity Assessment: Generalized weakness   Cervical / Trunk Assessment Cervical / Trunk Assessment: Normal   Communication Communication Communication: No apparent difficulties   Cognition Arousal: Alert Behavior During Therapy: WFL for tasks assessed/performed Cognition: Cognition impaired  OT - Cognition Comments: oriented to location and reason for admission. pt needs (A) for dual task                 Following commands: Intact       Cueing  General Comments   Cueing Techniques: Verbal cues  RA >90%   Exercises     Shoulder Instructions      Home Living Family/patient expects to be discharged to:: Private residence Living Arrangements: Alone Available Help at Discharge: Family;Friend(s);Available 24 hours/day Type of Home: Other(Comment) Home Access: Level entry     Home  Layout: One level     Bathroom Shower/Tub: Producer, television/film/video: Handicapped height Bathroom Accessibility: Yes   Home Equipment: Agricultural consultant (2 wheels);Cane - single point;Shower seat          Prior Functioning/Environment Prior Level of Function : Independent/Modified Independent             Mobility Comments: ind, uses SPC and RW intermittently as needed. Denies falls ADLs Comments: ind, does her own bathing/dressing - dining hall for meals.    OT Problem List: Decreased strength;Decreased activity tolerance;Impaired balance (sitting and/or standing)   OT Treatment/Interventions: Self-care/ADL training;Energy conservation;DME and/or AE instruction;Therapeutic activities;Patient/family education;Balance training      OT Goals(Current goals can be found in the care plan section)   Acute Rehab OT Goals Patient Stated Goal: to go back home OT Goal Formulation: With patient Time For Goal Achievement: 05/28/24 Potential to Achieve Goals: Good   OT Frequency:  Min 2X/week    Co-evaluation              AM-PAC OT 6 Clicks Daily Activity     Outcome Measure Help from another person eating meals?: None Help from another person taking care of personal grooming?: None Help from another person toileting, which includes using toliet, bedpan, or urinal?: None Help from another person bathing (including washing, rinsing, drying)?: None Help from another person to put on and taking off regular upper body clothing?: None Help from another person to put on and taking off regular lower body clothing?: None 6 Click Score: 24   End of Session Equipment Utilized During Treatment: Gait belt;Rolling walker (2 wheels) Nurse Communication: Mobility status;Precautions  Activity Tolerance: Patient tolerated treatment well Patient left: in chair;with call bell/phone within reach;with chair alarm set  OT Visit Diagnosis: Unsteadiness on feet (R26.81);Muscle  weakness (generalized) (M62.81)                Time: 1119-1130 OT Time Calculation (min): 11 min Charges:  OT General Charges $OT Visit: 1 Visit   Ely, OTR/L  Acute Rehabilitation Services Office: [phone] .   Ely Molt 05/14/2024, 12:23 PM

## 2024-05-14 NOTE — Progress Notes (Addendum)
 AVS in packet for family Script in packet also

## 2024-05-14 NOTE — NC FL2 (Signed)
 [place]  MEDICAID FL2 LEVEL OF CARE FORM     IDENTIFICATION  Patient Name: Amber Barnett Birthdate: 08-19-1934 Sex: female Admission Date (Current Location): 05/11/2024  [facility] and [place] Number:  Producer, television/film/video and Address:  [facility], [address] [place], [place] [address]      Provider Number: [id number]  Attending Physician Name and Address:  Darci Pore, MD  Relative Name and Phone Number:  Lennie Livers (Niece)  [phone] [facility])    Current Level of Care: Hospital Recommended Level of Care: Assisted Living Facility Prior Approval Number:    Date Approved/Denied:   PASRR Number:    Discharge Plan: Other (Comment) (Assisted Living Facility)    Current Diagnoses: Patient Active Problem List   Diagnosis Date Noted   Multifocal pneumonia 05/14/2024   Acute on chronic heart failure (HCC) 05/11/2024   Acute on chronic diastolic CHF (congestive heart failure) (HCC) 01/04/2024   Hypertensive urgency 01/04/2024   History of CVA (cerebrovascular accident) 01/04/2024   Metabolic acidosis 01/04/2024   Positive D dimer 01/04/2024   Pulmonary hypertension (HCC) 01/04/2024   CVA (cerebral vascular accident) (HCC) 08/14/2020   GIB (gastrointestinal bleeding) 08/11/2020   Rectal bleeding 12/25/2019   Current use of long term anticoagulation 05/21/2017   Cerebral embolism with cerebral infarction (HCC) 05/18/2017   Essential hypertension 05/08/2017   Permanent atrial fibrillation (HCC) 05/08/2017    Orientation RESPIRATION BLADDER Height & Weight     Self, Time, Situation, Place  Normal Continent Weight: 114 lb 3.2 oz (51.8 kg) Height:  5' 7.2 (170.7 cm)  BEHAVIORAL SYMPTOMS/MOOD NEUROLOGICAL BOWEL NUTRITION STATUS      Continent Diet (Low sodium heart healthy)  AMBULATORY STATUS COMMUNICATION OF NEEDS Skin   Limited Assist Verbally Normal                       Personal Care Assistance Level of  Assistance  Bathing, Feeding, Dressing Bathing Assistance: Independent Feeding assistance: Independent Dressing Assistance: Independent     Functional Limitations Info  Sight, Hearing, Speech Sight Info: Adequate Hearing Info: Adequate Speech Info: Adequate    SPECIAL CARE FACTORS FREQUENCY  PT (By licensed PT)     PT Frequency: 2x week              Contractures Contractures Info: Not present    Additional Factors Info  Code Status, Allergies Code Status Info: DNR interven Allergies Info: Fish-derived Products, Milk-related Compounds, Other, Wild Lettuce Extract (Lactuca Virosa), Amoxicillin           Current Medications (05/14/2024):   Medication List       STOP taking these medications     latanoprost  0.005 % ophthalmic solution Commonly known as: XALATAN     rosuvastatin  5 MG tablet Commonly known as: CRESTOR            TAKE these medications     acetaminophen  325 MG tablet Commonly known as: TYLENOL  Take 650 mg by mouth every 8 (eight) hours as needed for mild pain (pain score 1-3) or headache.    apixaban  2.5 MG Tabs tablet Commonly known as: ELIQUIS  Take by mouth 2 (two) times daily.    diltiazem  120 MG 24 hr capsule Commonly known as: TIAZAC  Take 120 mg by mouth daily.    donepezil  5 MG tablet Commonly known as: ARICEPT  Take 5 mg by mouth at bedtime.    doxycycline  100 MG tablet Commonly known as: VIBRA -TABS Take 1 tablet (100 mg total)  by mouth 2 (two) times daily for 4 days.    famotidine  20 MG tablet Commonly known as: PEPCID  Take 20 mg by mouth at bedtime.    fluticasone -salmeterol 100-50 MCG/ACT Aepb Commonly known as: ADVAIR Inhale 1 puff into the lungs 2 (two) times daily.    furosemide  20 MG tablet Commonly known as: Lasix  Take 1 tablet (20 mg total) by mouth every Monday, Wednesday, and Friday. What changed: when to take this    isosorbide  mononitrate 30 MG 24 hr tablet Commonly known as: IMDUR  Take 1 tablet (30 mg  total) by mouth daily.    MAGnesium -Oxide 400 (241.3 Mg) MG tablet Generic drug: magnesium  oxide Take 400 mg by mouth 2 (two) times daily.    memantine  10 MG tablet Commonly known as: NAMENDA  Take 10 mg by mouth 2 (two) times daily.    mirtazapine  15 MG tablet Commonly known as: REMERON  Take 15 mg by mouth at bedtime.    pantoprazole  40 MG tablet Commonly known as: PROTONIX  Take 40 mg by mouth daily.    sertraline 25 MG tablet Commonly known as: ZOLOFT Take 25 mg by mouth at bedtime.    UNABLE TO FIND Take 1 Container by mouth 2 (two) times daily. Magic Cup    Relevant Imaging Results:  Relevant Lab Results:   Additional Information SSN:  [id number] [place], [place]
